# Patient Record
Sex: Female | Born: 1935
Health system: Southern US, Community
[De-identification: ages and names within clinical notes are randomized; demographics above are authoritative.]

## PROBLEM LIST (undated history)

## (undated) DIAGNOSIS — I10 Essential (primary) hypertension: Secondary | ICD-10-CM

## (undated) DIAGNOSIS — K219 Gastro-esophageal reflux disease without esophagitis: Secondary | ICD-10-CM

## (undated) DIAGNOSIS — E039 Hypothyroidism, unspecified: Secondary | ICD-10-CM

## (undated) DIAGNOSIS — F0390 Unspecified dementia without behavioral disturbance: Secondary | ICD-10-CM

## (undated) DIAGNOSIS — E119 Type 2 diabetes mellitus without complications: Secondary | ICD-10-CM

## (undated) DIAGNOSIS — J45909 Unspecified asthma, uncomplicated: Secondary | ICD-10-CM

## (undated) DIAGNOSIS — G20A1 Parkinson's disease without dyskinesia, without mention of fluctuations: Secondary | ICD-10-CM

## (undated) DIAGNOSIS — J9819 Other pulmonary collapse: Secondary | ICD-10-CM

## (undated) DIAGNOSIS — E785 Hyperlipidemia, unspecified: Secondary | ICD-10-CM

## (undated) DIAGNOSIS — G20 Parkinson's disease: Secondary | ICD-10-CM

## (undated) HISTORY — PX: ABDOMINAL HYSTERECTOMY: SHX81

## (undated) HISTORY — PX: CHOLECYSTECTOMY: SHX55

## (undated) HISTORY — PX: OTHER SURGICAL HISTORY: SHX169

---

## 2004-04-15 ENCOUNTER — Ambulatory Visit: Payer: Self-pay | Admitting: Gastroenterology

## 2004-09-05 ENCOUNTER — Ambulatory Visit: Payer: Self-pay | Admitting: Internal Medicine

## 2004-09-08 ENCOUNTER — Ambulatory Visit: Payer: Self-pay | Admitting: Internal Medicine

## 2004-10-16 ENCOUNTER — Ambulatory Visit: Payer: Self-pay | Admitting: Internal Medicine

## 2005-10-27 ENCOUNTER — Ambulatory Visit: Payer: Self-pay | Admitting: Internal Medicine

## 2006-11-02 ENCOUNTER — Ambulatory Visit: Payer: Self-pay | Admitting: Internal Medicine

## 2007-02-02 ENCOUNTER — Ambulatory Visit: Payer: Self-pay | Admitting: Family Medicine

## 2007-10-18 ENCOUNTER — Ambulatory Visit: Payer: Self-pay | Admitting: Internal Medicine

## 2007-10-20 ENCOUNTER — Ambulatory Visit: Payer: Self-pay | Admitting: Internal Medicine

## 2007-12-15 ENCOUNTER — Ambulatory Visit: Payer: Self-pay | Admitting: Internal Medicine

## 2008-12-18 ENCOUNTER — Ambulatory Visit: Payer: Self-pay | Admitting: Internal Medicine

## 2009-07-01 ENCOUNTER — Ambulatory Visit: Payer: Self-pay | Admitting: Gastroenterology

## 2010-01-03 ENCOUNTER — Ambulatory Visit: Payer: Self-pay | Admitting: Internal Medicine

## 2011-02-05 ENCOUNTER — Ambulatory Visit: Payer: Self-pay | Admitting: Internal Medicine

## 2011-02-23 ENCOUNTER — Ambulatory Visit: Payer: Self-pay | Admitting: Internal Medicine

## 2011-04-21 ENCOUNTER — Ambulatory Visit: Payer: Self-pay | Admitting: Internal Medicine

## 2011-05-13 ENCOUNTER — Ambulatory Visit: Payer: Self-pay | Admitting: Internal Medicine

## 2011-09-24 ENCOUNTER — Ambulatory Visit: Payer: Self-pay | Admitting: Internal Medicine

## 2011-12-14 ENCOUNTER — Ambulatory Visit: Payer: Self-pay | Admitting: Internal Medicine

## 2012-01-22 ENCOUNTER — Emergency Department: Payer: Self-pay | Admitting: Emergency Medicine

## 2012-05-03 ENCOUNTER — Ambulatory Visit: Payer: Self-pay | Admitting: Physician Assistant

## 2012-06-13 ENCOUNTER — Ambulatory Visit: Payer: Self-pay | Admitting: Physician Assistant

## 2012-06-16 ENCOUNTER — Ambulatory Visit: Payer: Self-pay | Admitting: Internal Medicine

## 2012-06-16 ENCOUNTER — Ambulatory Visit: Payer: Self-pay | Admitting: Physician Assistant

## 2012-07-03 ENCOUNTER — Inpatient Hospital Stay: Payer: Self-pay | Admitting: Surgery

## 2012-07-03 LAB — COMPREHENSIVE METABOLIC PANEL
Albumin: 3.3 g/dL — ABNORMAL LOW (ref 3.4–5.0)
Alkaline Phosphatase: 109 U/L (ref 50–136)
Anion Gap: 10 (ref 7–16)
BUN: 30 mg/dL — ABNORMAL HIGH (ref 7–18)
Bilirubin,Total: 0.4 mg/dL (ref 0.2–1.0)
Calcium, Total: 8.7 mg/dL (ref 8.5–10.1)
Chloride: 104 mmol/L (ref 98–107)
Co2: 23 mmol/L (ref 21–32)
Creatinine: 0.95 mg/dL (ref 0.60–1.30)
EGFR (African American): >60
EGFR (Non-African Amer.): 59 — ABNORMAL LOW
Glucose: 167 mg/dL — ABNORMAL HIGH (ref 65–99)
Osmolality: 284 (ref 275–301)
Potassium: 4.1 mmol/L (ref 3.5–5.1)
SGOT(AST): 20 U/L (ref 15–37)
SGPT (ALT): 18 U/L (ref 12–78)
Sodium: 137 mmol/L (ref 136–145)
Total Protein: 6.4 g/dL (ref 6.4–8.2)

## 2012-07-03 LAB — URINALYSIS, COMPLETE
Bacteria: NONE SEEN
Bilirubin,UR: NEGATIVE
Glucose,UR: NEGATIVE mg/dL (ref 0–75)
Hyaline Cast: 1
Nitrite: NEGATIVE
Ph: 5 (ref 4.5–8.0)
Protein: NEGATIVE
RBC,UR: 10 /HPF (ref 0–5)
Specific Gravity: 1.021 (ref 1.003–1.030)
Squamous Epithelial: 1
WBC UR: 7 /HPF (ref 0–5)

## 2012-07-03 LAB — CBC
HCT: 37.1 % (ref 35.0–47.0)
HGB: 12.7 g/dL (ref 12.0–16.0)
MCH: 30.4 pg (ref 26.0–34.0)
MCHC: 34.3 g/dL (ref 32.0–36.0)
MCV: 89 fL (ref 80–100)
Platelet: 179 10*3/uL (ref 150–440)
RBC: 4.18 10*6/uL (ref 3.80–5.20)
RDW: 13.3 % (ref 11.5–14.5)
WBC: 8.2 10*3/uL (ref 3.6–11.0)

## 2012-07-08 ENCOUNTER — Encounter: Payer: Self-pay | Admitting: Internal Medicine

## 2012-07-09 ENCOUNTER — Encounter: Payer: Self-pay | Admitting: Internal Medicine

## 2012-08-09 ENCOUNTER — Ambulatory Visit: Payer: Self-pay | Admitting: Physician Assistant

## 2012-08-09 LAB — URINALYSIS, COMPLETE
Bilirubin,UR: NEGATIVE
Glucose,UR: NEGATIVE mg/dL (ref 0–75)
Hyaline Cast: 23
Nitrite: NEGATIVE
Ph: 5 (ref 4.5–8.0)
Protein: NEGATIVE
RBC,UR: 11 /HPF (ref 0–5)
Specific Gravity: 1.024 (ref 1.003–1.030)
Squamous Epithelial: 2
WBC UR: 30 /HPF (ref 0–5)

## 2012-08-09 LAB — BASIC METABOLIC PANEL
Anion Gap: 7 (ref 7–16)
BUN: 29 mg/dL — ABNORMAL HIGH (ref 7–18)
Calcium, Total: 9.3 mg/dL (ref 8.5–10.1)
Chloride: 101 mmol/L (ref 98–107)
Co2: 26 mmol/L (ref 21–32)
Creatinine: 1.2 mg/dL (ref 0.60–1.30)
EGFR (African American): 52 — ABNORMAL LOW
EGFR (Non-African Amer.): 44 — ABNORMAL LOW
Glucose: 258 mg/dL — ABNORMAL HIGH (ref 65–99)
Osmolality: 283 (ref 275–301)
Potassium: 4 mmol/L (ref 3.5–5.1)
Sodium: 134 mmol/L — ABNORMAL LOW (ref 136–145)

## 2012-08-09 LAB — CBC WITH DIFFERENTIAL/PLATELET
Basophil #: 0 10*3/uL (ref 0.0–0.1)
Basophil %: 0.5 %
Eosinophil #: 0.1 10*3/uL (ref 0.0–0.7)
Eosinophil %: 0.9 %
HCT: 38.1 % (ref 35.0–47.0)
HGB: 12.6 g/dL (ref 12.0–16.0)
Lymphocyte %: 10.1 %
Lymphs Abs: 0.8 10*3/uL — ABNORMAL LOW (ref 1.0–3.6)
MCH: 29.6 pg (ref 26.0–34.0)
MCHC: 33 g/dL (ref 32.0–36.0)
MCV: 90 fL (ref 80–100)
Monocyte #: 0.8 x10 3/mm (ref 0.2–0.9)
Monocyte %: 9.4 %
Neutrophil #: 6.3 10*3/uL (ref 1.4–6.5)
Neutrophil %: 79.1 %
Platelet: 237 10*3/uL (ref 150–440)
RBC: 4.25 10*6/uL (ref 3.80–5.20)
RDW: 13.5 % (ref 11.5–14.5)
WBC: 8 10*3/uL (ref 3.6–11.0)

## 2012-08-11 ENCOUNTER — Inpatient Hospital Stay: Payer: Self-pay

## 2012-08-11 LAB — CBC
HCT: 35.8 % (ref 35.0–47.0)
HGB: 11.9 g/dL — ABNORMAL LOW (ref 12.0–16.0)
MCH: 29.3 pg (ref 26.0–34.0)
MCHC: 33.2 g/dL (ref 32.0–36.0)
MCV: 88 fL (ref 80–100)
Platelet: 264 10*3/uL (ref 150–440)
RBC: 4.05 10*6/uL (ref 3.80–5.20)
RDW: 13.5 % (ref 11.5–14.5)
WBC: 6.5 10*3/uL (ref 3.6–11.0)

## 2012-08-11 LAB — BASIC METABOLIC PANEL
Anion Gap: 7 (ref 7–16)
BUN: 30 mg/dL — ABNORMAL HIGH (ref 7–18)
Calcium, Total: 9.2 mg/dL (ref 8.5–10.1)
Chloride: 103 mmol/L (ref 98–107)
Co2: 26 mmol/L (ref 21–32)
Creatinine: 1.13 mg/dL (ref 0.60–1.30)
EGFR (African American): 55 — ABNORMAL LOW
EGFR (Non-African Amer.): 48 — ABNORMAL LOW
Glucose: 226 mg/dL — ABNORMAL HIGH (ref 65–99)
Osmolality: 285 (ref 275–301)
Potassium: 3.8 mmol/L (ref 3.5–5.1)
Sodium: 136 mmol/L (ref 136–145)

## 2012-08-11 LAB — URINALYSIS, COMPLETE
Bilirubin,UR: NEGATIVE
Glucose,UR: 50 mg/dL (ref 0–75)
Leukocyte Esterase: NEGATIVE
Nitrite: NEGATIVE
Ph: 5 (ref 4.5–8.0)
Protein: NEGATIVE
RBC,UR: 30 /[HPF] (ref 0–5)
Specific Gravity: 1.031 (ref 1.003–1.030)
Squamous Epithelial: <1
WBC UR: 1 /[HPF] (ref 0–5)

## 2012-08-11 LAB — URINE CULTURE

## 2012-08-11 LAB — PROTIME-INR
INR: 1.1
Prothrombin Time: 14.5 secs (ref 11.5–14.7)

## 2012-08-11 LAB — TROPONIN I: Troponin-I: <0.02 ng/mL

## 2012-08-12 LAB — COMPREHENSIVE METABOLIC PANEL
Albumin: 2.2 g/dL — ABNORMAL LOW (ref 3.4–5.0)
Alkaline Phosphatase: 148 U/L — ABNORMAL HIGH (ref 50–136)
Anion Gap: 9 (ref 7–16)
BUN: 20 mg/dL — ABNORMAL HIGH (ref 7–18)
Bilirubin,Total: 0.4 mg/dL (ref 0.2–1.0)
Calcium, Total: 8.6 mg/dL (ref 8.5–10.1)
Chloride: 101 mmol/L (ref 98–107)
Co2: 24 mmol/L (ref 21–32)
Creatinine: 0.78 mg/dL (ref 0.60–1.30)
Glucose: 274 mg/dL — ABNORMAL HIGH (ref 65–99)
Osmolality: 281 (ref 275–301)
Potassium: 4.7 mmol/L (ref 3.5–5.1)
SGOT(AST): 13 U/L — ABNORMAL LOW (ref 15–37)
SGPT (ALT): 15 U/L (ref 12–78)
Sodium: 134 mmol/L — ABNORMAL LOW (ref 136–145)
Total Protein: 5.8 g/dL — ABNORMAL LOW (ref 6.4–8.2)

## 2012-08-12 LAB — CBC WITH DIFFERENTIAL/PLATELET
Basophil #: 0 10*3/uL (ref 0.0–0.1)
Basophil %: 0.2 %
Eosinophil #: 0 10*3/uL (ref 0.0–0.7)
Eosinophil %: 0 %
HCT: 35.4 % (ref 35.0–47.0)
HGB: 11.7 g/dL — ABNORMAL LOW (ref 12.0–16.0)
Lymphocyte %: 6.7 %
Lymphs Abs: 0.5 10*3/uL — ABNORMAL LOW (ref 1.0–3.6)
MCH: 29.7 pg (ref 26.0–34.0)
MCHC: 33.1 g/dL (ref 32.0–36.0)
MCV: 90 fL (ref 80–100)
Monocyte #: 0.2 x10 3/mm (ref 0.2–0.9)
Monocyte %: 3.6 %
Neutrophil #: 6 10*3/uL (ref 1.4–6.5)
Neutrophil %: 89.5 %
Platelet: 254 10*3/uL (ref 150–440)
RBC: 3.95 10*6/uL (ref 3.80–5.20)
RDW: 13.3 % (ref 11.5–14.5)
WBC: 6.7 10*3/uL (ref 3.6–11.0)

## 2012-08-16 LAB — CREATININE, SERUM
Creatinine: 0.86 mg/dL (ref 0.60–1.30)
EGFR (African American): >60
EGFR (Non-African Amer.): >60

## 2012-08-17 LAB — CULTURE, BLOOD (SINGLE)

## 2012-08-18 ENCOUNTER — Encounter: Payer: Self-pay | Admitting: Internal Medicine

## 2012-08-22 ENCOUNTER — Ambulatory Visit: Payer: Self-pay | Admitting: Cardiothoracic Surgery

## 2012-09-06 ENCOUNTER — Ambulatory Visit: Payer: Self-pay | Admitting: Cardiothoracic Surgery

## 2012-09-06 ENCOUNTER — Encounter: Payer: Self-pay | Admitting: Internal Medicine

## 2012-10-06 ENCOUNTER — Ambulatory Visit: Payer: Self-pay | Admitting: Cardiothoracic Surgery

## 2013-04-04 ENCOUNTER — Ambulatory Visit: Payer: Self-pay | Admitting: Internal Medicine

## 2013-04-05 ENCOUNTER — Encounter: Payer: Self-pay | Admitting: Neurology

## 2013-04-08 ENCOUNTER — Encounter: Payer: Self-pay | Admitting: Neurology

## 2013-05-08 ENCOUNTER — Encounter: Payer: Self-pay | Admitting: Neurology

## 2013-06-08 ENCOUNTER — Encounter: Payer: Self-pay | Admitting: Neurology

## 2013-09-15 DIAGNOSIS — R42 Dizziness and giddiness: Secondary | ICD-10-CM | POA: Insufficient documentation

## 2013-09-15 DIAGNOSIS — R262 Difficulty in walking, not elsewhere classified: Secondary | ICD-10-CM | POA: Insufficient documentation

## 2013-09-15 DIAGNOSIS — E119 Type 2 diabetes mellitus without complications: Secondary | ICD-10-CM | POA: Insufficient documentation

## 2013-10-02 ENCOUNTER — Ambulatory Visit: Payer: Self-pay | Admitting: Internal Medicine

## 2013-11-09 DIAGNOSIS — R5383 Other fatigue: Secondary | ICD-10-CM | POA: Insufficient documentation

## 2014-01-16 DIAGNOSIS — G20 Parkinson's disease: Secondary | ICD-10-CM | POA: Insufficient documentation

## 2014-01-16 DIAGNOSIS — G20A1 Parkinson's disease without dyskinesia, without mention of fluctuations: Secondary | ICD-10-CM | POA: Insufficient documentation

## 2014-02-14 ENCOUNTER — Emergency Department: Payer: Self-pay | Admitting: Emergency Medicine

## 2014-03-08 DIAGNOSIS — I10 Essential (primary) hypertension: Secondary | ICD-10-CM | POA: Insufficient documentation

## 2014-06-19 DIAGNOSIS — E119 Type 2 diabetes mellitus without complications: Secondary | ICD-10-CM | POA: Insufficient documentation

## 2014-06-19 DIAGNOSIS — N183 Chronic kidney disease, stage 3 unspecified: Secondary | ICD-10-CM | POA: Insufficient documentation

## 2014-06-19 DIAGNOSIS — E1122 Type 2 diabetes mellitus with diabetic chronic kidney disease: Secondary | ICD-10-CM | POA: Insufficient documentation

## 2014-09-28 NOTE — H&P (Signed)
PATIENT NAME:  Jacqueline [name], Jacqueline [name] MR#:  [id number] DATE OF BIRTH:  1935/12/18  DATE OF ADMISSION:  08/11/2012  REFERRING PHYSICIAN:  CONSULTING PHYSICIAN: Harrell Gave A. Lundquist, M.D.   REASON FOR CONSULTATION: Large right-sided effusion/hemothorax after previous fall with rib fractures.   HISTORY OF PRESENT ILLNESS: The patient is a pleasant 79 year old female, who I have treated personally for a trauma, which occurred in January of this year. She was admitted following a fall where she fell backwards after she slipped on steps; approximately 36 inches. She hit her right chest at that time and had rib fractures. She was admitted for pain control at that time and she now presents with increasing shortness of breath over the last few days to the point of lightheadedness. On CT scan, she was found to have a large effusion with some blood and a significantly collapsed right lung. She otherwise is not having any fevers, subjective chills, night sweats or cough. She is having some chest pain with deep inhalation. No abdominal pain, nausea, vomiting, diarrhea, constipation, dysuria or hematuria. She appears to have a UTI on her urinalysis.   PAST MEDICAL HISTORY:  1.  Rib fractures, status post fall. 2.  Diabetes.  3.  Hypertension.  4.  Hypothyroidism.  5.  Depression.   PAST SURGICAL HISTORY: History of cholecystectomy and hysterectomy.   ALLERGIES: None.   MEDICATIONS: As follows:  1.  Vitamin D daily.  2.  Symbicort  2 puffs b.i.d.  3.  Ramipril 2.5 daily.  4.  Omeprazole 40 mg daily.  5.  Novolin sliding scale.  6.  L-thyroxine 50 mcg p.o. daily. 7.  Glimepiride 4 mg p.o. daily. 8.  Celexa 40 mg p.o. daily.  9.  Aspirin 81 mg p.o. daily. 10. Percocet p.r.n.   ALLERGIES: No known drug allergies.   FAMILY HISTORY: Noncontributory.    SOCIAL HISTORY: She has been staying with her daughter. She was in rehab short-term. She does not smoke or drink.   REVIEW OF SYSTEMS: A 12-point  review of systems obtained. Pertinent positives and negatives as noted.   PHYSICAL EXAM:  VITAL SIGNS: Temperature 97.8, pulse 91, blood pressure 120/65, respirations 18, pulse oximetry 94% on facemask.  GENERAL: No acute distress. Alert and oriented x 3.  HEAD: Normocephalic, atraumatic.  EYES: No scleral icterus. No conjunctivitis. FACE: No obvious facial trauma. Normal external nose. Normal external ears.  NECK: No obvious neck masses or swelling.  CHEST: Decreased breath sounds on the right, particularly at the bases. No retractions, but appears uncomfortable with normal breathing.  HEART: Regular rate and rhythm. No murmurs, rubs, or gallops.  ABDOMEN: Soft, nontender and nondistended.  EXTREMITIES: Moves all extremities well. Strength 5 out of 5. Sensation is intact in all 4 extremities. Cranial nerves II through XII grossly intact.   LABS: Significant for white cell count 6.5, hemoglobin 11.9, hematocrit 35.8, platelets 264. Troponin is less than 0.02. Blood sugar 226. She does have a UA, which shows 3+ leukocyte esterase and 30 white blood cell count. CT scan shows a large right effusion with significant collapse of right lung as well as previously identified fractures.   ASSESSMENT AND PLAN: The patient is a 79 year old female with history of rib fractures with a large effusion, which is significantly compressing her right lung. Dr. Genevive Bi evaluated the patient with myself and would be for either tube thoracostomy or video-assisted thoracotomy and possible decortication. As the patient has had rib fractures 2 months ago, there is likely a good  chance that this could be a loculated effusion, which would not expand with a chest tube and we would thus be delaying the inevitable of placing a chest tube. We have thus elected for video-assisted thoracoscopy and as well as decortication and direct placement of tube. The patient is in agreement with this.   ____________________________ Glena Norfolk. Lundquist, MD cal:aw D: 08/11/2012 21:82:88 ET T: 08/11/2012 08:27:19 ET JOB#: [id number]  cc: Harrell Gave A. Lundquist, MD, <Dictator> Floyde Parkins MD ELECTRONICALLY SIGNED 08/13/2012 11:15

## 2014-09-28 NOTE — H&P (Signed)
PATIENT NAME:  Jacqueline [name], Jacqueline [name] MR#:  [id number] DATE OF BIRTH:  07/13/1935  DATE OF ADMISSION:  07/03/2012  CHIEF COMPLAINT: Right chest pain.   HISTORY OF PRESENT ILLNESS: This is a patient who experienced a backwards fall from the third step inside her house where she slipped on the steps and fell backward, a height of approximately 36 inches or less. She did not lose consciousness. Her chief complaint is that of right chest and back pain and right shoulder pain. She is not short of breath. I was asked to see the patient for evaluation and admission for pain control issues concerning rib fractures posteriorly.   PAST MEDICAL HISTORY: Diabetes, hypertension, hypothyroidism, and depression.   PAST SURGICAL HISTORY: Cholecystectomy and hysterectomy.   ALLERGIES: None.   MEDICATIONS: Multiple, see chart.   FAMILY HISTORY: Noncontributory.   SOCIAL HISTORY: The patient does not smoke or drink. She lives at home.   REVIEW OF SYSTEMS: A 10-system review is performed and negative with the exception of that mentioned in the HPI.   PHYSICAL EXAMINATION: GENERAL: Healthy, comfortable-appearing Caucasian female patient. She is accompanied by her family.  VITAL SIGNS: Temperature 98.1, pulse 72, respirations 16, blood pressure 127/65. Pain scale of 9.  HEENT: No scleral icterus.  NECK: No palpable neck nodes.  CHEST: Clear to auscultation. She has ecchymosis starting on the back of the chest on the right side and on the right posterior hip, tender posteriorly as well.  CARDIAC: Regular rate and rhythm.  ABDOMEN: Soft, nontender.  EXTREMITIES: Without edema.  NEUROLOGICAL: Grossly intact.  INTEGUMENT: No jaundice. The ecchymosis as noted above, as mentioned.   LABORATORY AND RADIOLOGICAL DATA:  CT head and neck are within normal limits. No bony abnormalities. There are multiple posterior rib fractures on her chest CT.  Right shoulder films are within normal limits with the exception of the rib  fractures.   Urinalysis shows 2+ leukocyte esterase. Electrolytes are within normal limits. H and H is 12.7 and 37.   ASSESSMENT AND PLAN: This is a patient with a fall backwards off of her steps at her house, approximately 3 feet or less, with no loss of consciousness or chief complaint as related to rib fractures on the right side.  Pain control will be an issue with this many rib fractures. I have talked to Dr. Jasmine December, the Emergency Room physician, and the plan is for admission and pain control. The rationale for this has been discussed with the patient and her family. They understood and agreed to proceed.   ____________________________ Jerrol Banana Burt Knack, MD rec:cb D: 07/03/2012 15:34:08 ET T: 07/03/2012 16:05:04 ET JOB#: [id number]  cc: Jerrol Banana. Burt Knack, MD, <Dictator> Florene Glen MD ELECTRONICALLY SIGNED 07/03/2012 18:34

## 2014-09-28 NOTE — Discharge Summary (Signed)
PATIENT NAME:  Jacqueline [name], Jacqueline [name] MR#:  [id number] DATE OF BIRTH:  08-13-1935  DATE OF ADMISSION:  08/11/2012 DATE OF DISCHARGE:  08/17/2012   ADMITTING DIAGNOSIS: Right hemothorax.   POSTOPERATIVE DIAGNOSIS: Right hemothorax.  OPERATION PERFORMED: Right thoracoscopy with evacuation of hemothorax, 08/11/2012.   HOSPITAL COURSE: The patient is a 79 year old woman who fell and sustained multiple rib fractures on the right several weeks ago. She was initially managed in the hospital for pain control, but presented to our Emergency Department with increasing shortness of breath and cough and a chest CT revealing a large hemothorax on the right. There were multiple rib fractures that were in various stages of healing and she was taken to the Operating Room on 08/11/2012, at which time she underwent a right thoracoscopy with evacuation of her hemothorax. She did well in the postoperative period and had her chest tubes removed on March 12. At the time her chest tube was removed, she had a small apical pneumothorax which had been sterile. The post-removal chest x-ray showed that the small pneumothorax was unchanged after removal of her tubes. Her wounds were sterilely dressed and she was allowed to be transferred to a skilled nursing facility/rehab facility.   DISCHARGE MEDICATIONS: At the time of discharge, her medications included glimepiride 4 mg once a day, levothyroxine 50 mcg once a day, Celexa 40 mg once a day, omeprazole 40 mg once a day, Symbicort 2 puffs twice a day, ramipril 2.5 mg once a day, Norco 5/325 with 1 tablet or 2 tablets every 4 hours as needed and levofloxacin 250 mg once a day.   DISCHARGE INSTRUCTIONS: She should follow up with Dr. Nestor Lewandowsky at  [facility] next Monday, March 17. In addition, she should have an x-ray made prior to her visit. She should also have her dressings remain intact until Saturday, March 15, at which time the dressing should be completely removed. The wound  should be cleansed with Hibiclens and covered with a sterile dry gauze pad.   Should the patient experience any fevers, chills or significant drainage from her wound, she should contact [facility].     ____________________________ Lew Dawes. Genevive Bi, MD teo:jm D: 08/17/2012 15:30:09 ET T: 08/17/2012 16:12:33 ET JOB#: [id number]  cc: Christia Reading E. Genevive Bi, MD, <Dictator> Louis Matte MD ELECTRONICALLY SIGNED 08/22/2012 7:52

## 2014-09-28 NOTE — H&P (Signed)
Subjective/Chief Complaint rt chest pain    History of Present Illness fall from third step backwards, no LOC rt chest and back pain, rt shoulder paIN    Past History PMH: DM, HTN, hypothyroid, depression PSH: GB, hysterectomy    Past Medical Health Hypertension, Diabetes Mellitus   Past Med/Surgical Hx:  HTN:   Diabetes:   ALLERGIES:  No Known Allergies:   HOME MEDICATIONS: Medication Instructions Status  Janumet 50 mg-500 mg oral tablet 1 tab(s) orally once a day Active  ramipril 5 mg oral capsule 1 cap(s) orally once a day Active  glimepiride 4 mg oral tablet 1 tab(s) orally once a day Active  levothyroxine 50 mcg (0.05 mg) oral capsule 1 cap(s) orally once a day Active  aspirin 81 mg oral tablet 1 tab(s) orally once a day Active  Celexa 40 mg oral tablet 1 tab(s) orally once a day Active  Flonase 50 mcg/inh nasal spray 1 spray(s) nasal once a day, As Needed Active  omeprazole 40 mg oral delayed release capsule 1 cap(s) orally once a day Active  Symbicort 80 mcg-4.5 mcg/inh inhalation aerosol 2 puff(s) inhaled 2 times a day Active   Family and Social History:   Family History Non-Contributory    Social History negative tobacco, negative ETOH    Place of Living Home   Review of Systems:   Fever/Chills No    Cough No    Abdominal Pain No    Diarrhea No    Constipation No    Nausea/Vomiting No    SOB/DOE No    Chest Pain Yes  rt posterior    Dysuria No    Tolerating Diet Yes    Medications/Allergies Reviewed Medications/Allergies reviewed   Physical Exam:   GEN no acute distress    HEENT pink conjunctivae    NECK supple    RESP normal resp effort  clear BS  no use of accessory muscles  bruising rt post chest and hip    CARD regular rate    ABD denies tenderness  soft    LYMPH negative neck    PSYCH alert, A+O to time, place, person, good insight   Lab Results: Hepatic:  26-Jan-14 08:10    Bilirubin, Total 0.4   Alkaline Phosphatase  109   SGPT (ALT) 18   SGOT (AST) 20   Total Protein, Serum 6.4   Albumin, Serum  3.3  Routine Chem:  26-Jan-14 08:10    Glucose, Serum  167   BUN  30   Creatinine (comp) 0.95   Sodium, Serum 137   Potassium, Serum 4.1   Chloride, Serum 104   CO2, Serum 23   Calcium (Total), Serum 8.7   Osmolality (calc) 284   eGFR (African American) >60   eGFR (Non-African American)  59 (eGFR values <57mL/min/1.73 m2 may be an indication of chronic kidney disease (CKD). Calculated eGFR is useful in patients with stable renal function. The eGFR calculation will not be reliable in acutely ill patients when serum creatinine is changing rapidly. It is not useful in  patients on dialysis. The eGFR calculation may not be applicable to patients at the low and high extremes of body sizes, pregnant women, and vegetarians.)   Anion Gap 10  Routine UA:  26-Jan-14 08:47    Color (UA) Yellow   Clarity (UA) Hazy   Glucose (UA) Negative   Bilirubin (UA) Negative   Ketones (UA) Trace   Specific Gravity (UA) 1.021   Blood (UA) 2+  pH (UA) 5.0   Protein (UA) Negative   Nitrite (UA) Negative   Leukocyte Esterase (UA) 2+ (Result(s) reported on 03 Jul 2012 at 09:11AM.)   RBC (UA) 10 /HPF   WBC (UA) 7 /HPF   Bacteria (UA) NONE SEEN   Epithelial Cells (UA) 1 /HPF   Mucous (UA) PRESENT   Hyaline Cast (UA) 1 /LPF (Result(s) reported on 03 Jul 2012 at 09:11AM.)  Routine Hem:  26-Jan-14 08:10    WBC (CBC) 8.2   RBC (CBC) 4.18   Hemoglobin (CBC) 12.7   Hematocrit (CBC) 37.1   Platelet Count (CBC) 179 (Result(s) reported on 03 Jul 2012 at 08:24AM.)   MCV 89   MCH 30.4   MCHC 34.3   RDW 13.3   Radiology Results: XRay:    18-Apr-13 09:35, Upper GI   Upper GI   REASON FOR EXAM:    Dysphagia  COMMENTS:       PROCEDURE: FL  - FL UPPER GI  - Sep 24 2011  9:35AM     RESULT:     HISTORY: Dysphasia.    FINDINGS: Standard Upper GI obtained. The esophagus and stomach are   normal. The duodenal bulb is  normal. C-loop is normal.    IMPRESSION:  Benign exam.      Thank you for this opportunity to contribute to the care of your patient.           Verified By: Osa Craver, M.D., MD    16-Aug-13 23:25, Facial Bones Limited   Facial Bones Limited   REASON FOR EXAM:    fall, swelling, right zygomatic arch  COMMENTS:   LMP: Post-Menopausal    PROCEDURE: DXR - DXR FACIAL BONES LIMITED  - Jan 22 2012 11:25PM     RESULT: Five views of the facial bones are submitted. The maxillary   sinuses exhibit no air-fluid levels. The nasal bone appears intact. The   orbital rims also appear intact. The zygomatic arches are intact where   visualized.    IMPRESSION:  There is no evidence of acute facial bone fracture on this   plain film series. If there are strong clinical concerns of an occult   fracture, further evaluation with CT scanning would be useful.        Verified By: DAVID A. Martinique, M.D., MD    16-Aug-13 23:25, Wrist Right Complete   Wrist Right Complete   REASON FOR EXAM:    fall  COMMENTS:   LMP: Post-Menopausal    PROCEDURE: DXR - DXR WRIST RT COMP WITH OBLIQUES  - Jan 22 2012 11:25PM     RESULT: Four views of the right wrist reveal the bones to be mildly   osteopenic. There is no evidence of an acute fracture. There are mild   degenerative changes of the first carpometacarpal joint. The overlying   soft tissues are normal in appearance.    IMPRESSION:  There is no evidence of an acute fracture of the right wrist.          Verified By: DAVID A. Martinique, M.D., MD    06-Jan-14 16:13, Hand Left Complete   Hand Left Complete   REASON FOR EXAM:    lt hand pain in snuffbox area  COMMENTS:       PROCEDURE: DXR - DXR HAND LT COMPLETE  W/OBLIQUES  - Jun 13 2012  4:13PM     RESULT: Three views of the left hand reveal the bones to be osteopenic.  There is mild degenerative interphalangeal joint change. This is most   pronounced at the level of the DIP joint of the index  finger with is mild   lateral subluxation of the distal phalanx with respect to the middle   phalanx. I do not see evidence of an acute fracture. The metacarpals   appear intact.    IMPRESSION:  There are mild degenerative interphalangeal joint changes.   Slight subluxation laterally of the distal phalanx with respect to the   middle phalanx is noted of the index finger. This could be acute or old     butno fracture is demonstrated. Incidental note is made of mild   degenerative change of the first carpometacarpal joint.     Dictation Site: 2        Verified By: DAVID A. Martinique, M.D., MD    26-Jan-14 01:41, Chest PA and Lateral   Chest PA and Lateral   REASON FOR EXAM:    fall-rib pain  COMMENTS:   LMP: Post Hysterectomy    PROCEDURE: DXR - DXR CHEST PA (OR AP) AND LATERAL  - Jul 03 2012  1:41AM     RESULT: There appear to be several right rib fractures including the a   and 9 and possibly seventh ribs. There is a small amount of pleural   fluid, likely minimal hemorrhage. No pneumothorax is evident. The cardiac   silhouette is normal. There is no mediastinal widening. The left   hemithorax is clear.    IMPRESSION:  Right rib fractures which are slightly displaced.    Dictation Site: 6    Verified By: Sundra Aland, M.D., MD    26-Jan-14 01:41, Shoulder Right Complete   Shoulder Right Complete   REASON FOR EXAM:    fall-shoulder pain  COMMENTS:       PROCEDURE: DXR - DXR SHOULDER RIGHT COMPLETE  - Jul 03 2012  1:41AM     RESULT: Right shoulder image shows the humeral head located in the   glenoid without a proximal humeral fracture or definite scapular   fracture. The clavicle appears intact. There are changes of right rib   fracture involving the eighth rib with slight displacement. Additional   rib fractures may be present.    IMPRESSION:  Right rib fracture. No acute bony abnormality of the right   shoulder evident.    Dictation Site: 6    Verified By:  Sundra Aland, M.D., MD  LabUnknown:    26-Nov-13 16:09, Screening Digital Mammogram   [id number]   REASON FOR EXAM:    scr mammo no order  COMMENTS:       PROCEDURE: MAM - MAM DGTL SCRN MAM NO ORDER W/CAD  - May 03 2012  4:09PM     RESULT:     FINDINGS: There is no personal or family history of breast cancer. The   patient denies previous breast surgery.     Comparison is made to digital images including exams dated 05 February 2011, 03 January 2010 and 18 December 2008 and to previous images dating back to   16 Oct 2004.     There is a mildly dense parenchymal pattern without a developing     parenchymal density, dominant mass or malignant calcifications. Skin nevi   are marked bilaterally.    IMPRESSION:  Stable, benign appearing bilateral mammogram.     BI-RADS: Category 2 - Benign Findings     RECOMMENDATION:  Please continue to encourage annual  mammographic   follow-up and monthly breast self exam.    Dictation Site: 2    Thank you for this opportunity to contribute to the care of your patient.    A NEGATIVE MAMMOGRAM REPORT DOES NOT PRECLUDE BIOPSY OR OTHER EVALUATION     OF A CLINICALLY PALPABLE OR OTHERWISE SUSPICIOUS MASS OR LESION. BREAST   CANCER MAY NOT BE DETECTED BY MAMMOGRAPHY IN UP TO 10% OF CASES.         Verified By: Sundra Aland, M.D., MD   PACS Image    06-Jan-14 16:13, Hand Left Complete   PACS Image    09-Jan-14 11:27, KDLWRIST   KDLWRIST   REASON FOR EXAM:    left wrist pain  COMMENTS:       PROCEDURE: KDR - KDXR WRIST LT COMP WITH OBLIQUES  - Jun 16 2012 11:27AM     RESULT: Four views of the left wrist are submitted.    The bones are osteopenic. The distal radius and ulna appear intact. No   carpal bone fracture is demonstrated. There are degenerative changes of   the intercarpal joints and of the first carpometacarpal joint. The   metacarpals appear intact.    IMPRESSION:  There are degenerative changes of the wrist. I do  not see  objective evidence of an acute fracture.  If there are strong clinical concerns of an occult fracture or of   significant ligamentous or tendinous injury, followup MRI may be useful.     Dictation Site: 2        Verified By: DAVID A. Martinique, M.D., MD   PACS Image    09-Jan-14 11:52, CT Chest Without Contrast   PACS Image    09-Jan-14 11:52, CT Head Without Contrast   PACS Image    26-Jan-14 01:41, Chest PA and Lateral   PACS Image    26-Jan-14 01:41, Shoulder Right Complete   PACS Image    26-Jan-14 09:48, CT Cervical Spine Without Contrast   PACS Image    26-Jan-14 09:48, CT Head Without Contrast   PACS Image    26-Jan-14 09:55, CT Chest, Abd, and Pelvis With Contrast   PACS Image  CT:    08-Jul-13 12:00, CT Chest With Contrast   CT Chest With Contrast   REASON FOR EXAM:    pulmonary nodule fu  COMMENTS:       PROCEDURE: CT  - CT CHEST WITH CONTRAST  - Dec 14 2011 12:00PM     RESULT: Comparison: 04/21/2011    Technique: Multiple axial images of the chest were obtained with 75 mL   Isovue 370 intravenous contrast.    Findings:  There are small calcifications in the left lobe of the thyroid, which are   nonspecific. No mediastinal, hilar, or axillary lymphadenopathy. Mild   prominence of the central intrahepatic biliary ducts as well as the   extrahepatic bile duct may be related to the prior cholecystectomy.  There is a 5 mm focus of subpleural thickening in the posterior superior   right lower lobe which appears less conspicuous than prior, particularly   in its anterior-posterior dimension. Linear density in the left lower   lobe is likely secondary to atelectasis. Minimal dependent density in the   posterior superior left lower lobe is likely secondary to atelectasis.    No aggressive lytic or sclerotic osseous lesions are identified.    IMPRESSION:   Small, 5 mm subpleural density in the posterior superior right lower lobe  is less conspicuous than  prior and likely represents an area of evolving   atelectasis or scarring. However, a followup noncontrast chest CT is   suggested in 6 months.        Verified By: Gregor Hams, M.D., MD    09-Jan-14 11:52, CT Chest Without Contrast   CT Chest Without Contrast   REASON FOR EXAM:    Lung Nodule Follow Up  COMMENTS:       PROCEDURE: KCT - KCT CHEST WITHOUT CONTRAST  - Jun 16 2012 11:52AM     RESULT:     Comparison is made to a prior study dated 12/14/2011.    Technique: Helical noncontrast 3 mm sections are obtained from the   thoracic inlet through the lung bases.    Findings: The mediastinal and hilar regions and structures demonstrate   small, subcentimeter lymph nodes in the paratracheal region, AP window   and to a lesser extent prevascular space. Thereis no gross evidence of     mediastinal masses. The lung parenchyma demonstrates near complete   resolution of the previously described nodular area of pleural thickening   within the posterior aspect of the superior segment of the right lower   lobe. A stable area of increased density projects within the medial   aspect of the lingula anteriorly. No further focal regions of   consolidation or focal infiltrates are appreciated. The visualized upper   abdominal viscera demonstrate intrahepatic andextrahepatic biliary   ductal dilatation. Small, rounded hyperdense foci are identified within   the left kidney measuring approximately 7 to 8 mm in diameter.     IMPRESSION:     1.  Near complete resolution of the nodular pleural thickening within the   right lower lobe.  2.  Persistent density in the region of the lingula, stable.  3.  Indeterminate findings in the left kidney.  4.  Intrahepatic and extrahepatic biliary ductal dilatation. Clinical   correlation is recommended, if clinically warranted. Findings may be   secondary to the patient's age as well as prior cholecystectomy, if   clinically appropriate. Continued  surveillance evaluation is recommended.       Thank you for this opportunity to contribute to the care of your patient.         Verified By: Mikki Santee, M.D., MD    09-Jan-14 11:52, CT Head Without Contrast   CT Head Without Contrast   REASON FOR EXAM:    muscle weakness gait disturbance eval for parkinsons  COMMENTS:       PROCEDURE: KCT - KCT HEAD WITHOUT CONTRAST  - Jun 16 2012 11:52AM     RESULT: Technique: Helical noncontrasted 5 mm sections were obtained from   the skull basethrough the vertex.    Findings: Diffuse cortical and cerebellar atrophy is identified as well   as diffuse areas of low attenuation within the subcortical, deep and   periventricular white matter regions. There is not evidence of   intra-axial nor extra-axial fluid collections, acute hemorrhage, mass   effect, nor a depressed skull fracture. The visualized paranasal sinuses   and mastoid air cells are patent.  IMPRESSION:  Chronic and involutional changes without evidence of acute   abnormalities. If there is persistent clinical concern further evaluation   with MRI is recommended.           Thank you for the opportunity to contribute to the care of your patient.  Verified By: Mikki Santee, M.D., MD    26-Jan-14 09:48, CT Cervical Spine Without Contrast   CT Cervical Spine Without Contrast   REASON FOR EXAM:    pain following trauma  COMMENTS:       PROCEDURE: CT  - CT CERVICAL SPINE WO  - Jul 03 2012  9:48AM     RESULT: Emergent noncontrast CT of the cervical spine is performed   utilizing a multislice helical acquisition. Images are reconstructed at 2   mm slice thickness at bone window settings in the axial, coronal and   sagittal planes. There is a scoliotic curvature concave toward the left   in the cervical spine which could be secondary to muscle spasm. There is   loss of the normal cervical lordosis. The alignment is otherwise normal.   The prevertebral soft tissues  are normal. No fracture or subluxation is   evident. Facet hypertrophy is present.    IMPRESSION:   1. No acute cervical spine bony abnormality demonstrated. Facet   hypertrophy predominantly on the left in the mid to lower cervical region.    Dictation Site: 6        Verified By: Sundra Aland, M.D., MD    26-Jan-14 09:48, CT Head Without Contrast   CT Head Without Contrast   REASON FOR EXAM:    pain following trauma  COMMENTS:       PROCEDURE: CT  - CT HEAD WITHOUT CONTRAST  - Jul 03 2012  9:48AM     RESULT: Comparison is made to the previous exam dated 16 June 2012.    A there is prominence of the ventricles and sulciconsistent with   atrophy. There is no intracranial hemorrhage, mass or mass effect.   Bilateral basal ganglia lacunar infarcts are present. There is   low-attenuation diffusely in the periventricular and subcortical white   matter. The sinuses and mastoid air cells show normal aeration. The   calvarium is intact.    IMPRESSION:   1. Atrophy with chronic microvascular ischemic disease and bilateral   basal ganglia lacunar infarcts. No acute intracranial abnormality evident.    Dictation Site: 6        Verified By: Sundra Aland, M.D., MD    26-Jan-14 09:55, CT Chest, Abd, and Pelvis With Contrast   CT Chest, Abd, and Pelvis With Contrast   REASON FOR EXAM:    (1) pain following trauma; (2) pain following trauma  COMMENTS:       PROCEDURE: CT  - CT CHEST ABDOMEN AND PELVIS W  - Jul 03 2012  9:55AM     RESULT: CT of the chest, abdomen and pelvis is performed utilizing 80 mL   of Isovue-300 iodinated intravenous contrast without oral contrast.   Images are reconstructed at 3 mm slice thickness in the axial plane.   Comparison is made to images dated 14 December 2011 and 21 April 2011   through the chest.    There is reported history of cholecystectomy, hysterectomy and   appendectomy. There is a small right pleural effusion area trace pleural    thickening or left pleural effusion is present. There is no evidence of   pneumothorax. Bilateral lower lobe atelectasis or infiltrate is present,     slightly more prominent on the right. There is no edema. No discrete   focal pulmonary masses appreciated. The heart is mildly enlarged. There   is no pericardial effusion. The included thyroid lobes appear to be  unremarkable. There is noaxillary mass or adenopathy. The thoracic aorta   is normal in caliber area there are fractures in the right 10, 9 comment   eighth and seventh ribs as well is in the sixth rib. The spine appears to   be intact. The pelvis appears unremarkable. The clavicles appear intact.    There is intrahepatic and extrahepatic biliary ductal dilation. The   common bile duct diameter is 1.8 cm. This is not significantly changed   compared to 21 April 2011 but is slightly larger. Intrahepatic ductal   dilation was present at that time as well. Given the history of   cholecystectomy it is uncertain if this is significant. Correlate with   clinical and laboratory data. The kidneys appear to enhance symmetrically   without obstruction. The adrenal glands are unremarkable. The liver and     spleen appear intact. There is a small hiatal hernia. The abdominal aorta   is normal in caliber. The pancreas shows no mass or ductal dilation. The   stomach is nondistended. The small bowel and colon appear to be   unremarkable. The urinary bladder contains a moderately large amount of   urine. There are cystic changes in the left pelvis suggestive of multiple   left adnexal cysts. Cyst in the posterior right pelvis is likely from the   right ovary. Small amountof free fluid appears present in the right   pelvis. The abdominal wall appears intact.    IMPRESSION:   1. Multiple posterior right rib fractures.  2. Intra and extrahepatic biliary ductal dilation.  3. Ovarian cysts. Given the patient's age this should be in a  weighted   with gynecologic consultation obtained.  4. Not mentioned above or small renal cysts.  Dictation Site: 6        Verified By: Sundra Aland, M.D., MD     Assessment/Admission Diagnosis fall, no LOC fx ribs, no PTX admit for pain control   Electronic Signatures: Florene Glen (MD)  (Signed 26-Jan-14 11:41)  Authored: CHIEF COMPLAINT and HISTORY, PAST MEDICAL/SURGIAL HISTORY, ALLERGIES, HOME MEDICATIONS, FAMILY AND SOCIAL HISTORY, REVIEW OF SYSTEMS, PHYSICAL EXAM, LABS, Radiology, ASSESSMENT AND PLAN   Last Updated: 26-Jan-14 11:41 by Florene Glen (MD)

## 2014-09-28 NOTE — Discharge Summary (Signed)
PATIENT NAME:  Jacqueline [name], Jacqueline [name] MR#:  [id number] DATE OF BIRTH:  [birth year]  DATE OF ADMISSION:  07/03/2012  DATE OF DISCHARGE:  07/07/2012  REASON FOR ADMISSION:  Fall, right rib fractures x 5.   OTHER DIAGNOSES: Diabetes mellitus, hypertension, hypothyroidism, depression, history of gallbladder surgery and hysterectomy.   DISCHARGE MEDICATIONS: Are as follows: Janumet 50/500 p.o. daily, ramipril  5 mg p.o. daily, glimepiride 5 mg p.o. daily, L-thyroxine 50 mcg p.o. daily, aspirin 81 p.o. daily, Celexa 40 mg p.o. daily, Flonase 50 mcg inhaler nasal spray, 1 p.o. daily; omeprazole 40 mg p.o. daily, Symbicort 2 puffs b.i.d. Vicodin 1 tab p.o. q.4 hours p.r.n. pain.   INDICATION FOR ADMISSION:  The patient is a pleasant 79 year old female who had a fall which resulted in 5 rib fractures on the right. She was admitted for pain control, pulmonary toilet and physical therapy.   HOSPITAL COURSE:  The patient was admitted. She was begun on PCA and given oral pain meds. Throughout the hospital course, her pain became better controlled with oral, and she was requiring less IV narcotics. At the time of discharge, she was tolerating her pain and undergoing physical therapy with oral narcotics and no IV narcotics.   DISCHARGE INSTRUCTIONS:  The patient is to go to rehabilitation. She is to continue to take Percocet for pain control. She is to follow up with myself or Dr. Burt Knack in 1 to 2 weeks to ensure she is improving.   ____________________________ Glena Norfolk. Tallyn Holroyd, MD cal:dm D: 07/07/2012 09:52:43 ET T: 07/07/2012 10:28:57 ET JOB#: [id number]  cc: Harrell Gave A. Jeremey Bascom, MD, <Dictator> Floyde Parkins MD ELECTRONICALLY SIGNED 07/09/2012 13:20
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
## 2014-11-06 ENCOUNTER — Other Ambulatory Visit
Admission: RE | Admit: 2014-11-06 | Discharge: 2014-11-06 | Disposition: A | Discharge status: Home / Self Care | Payer: Medicare Other | Source: Ambulatory Visit | Attending: Internal Medicine | Admitting: Internal Medicine

## 2014-11-06 DIAGNOSIS — N390 Urinary tract infection, site not specified: Secondary | ICD-10-CM | POA: Insufficient documentation

## 2014-11-06 DIAGNOSIS — R109 Unspecified abdominal pain: Secondary | ICD-10-CM | POA: Diagnosis not present

## 2014-11-06 DIAGNOSIS — G259 Extrapyramidal and movement disorder, unspecified: Secondary | ICD-10-CM | POA: Insufficient documentation

## 2014-11-06 DIAGNOSIS — E042 Nontoxic multinodular goiter: Secondary | ICD-10-CM | POA: Diagnosis not present

## 2014-11-06 LAB — FOLATE: Folate: 10.4 ng/mL (ref >5.9)

## 2014-11-06 LAB — COMPREHENSIVE METABOLIC PANEL
ALT: 8 U/L — ABNORMAL LOW (ref 14–54)
AST: 16 U/L (ref 15–41)
Albumin: 4.1 g/dL (ref 3.5–5.0)
Alkaline Phosphatase: 134 U/L — ABNORMAL HIGH (ref 38–126)
Anion gap: 10 (ref 5–15)
BUN: 28 mg/dL — ABNORMAL HIGH (ref 6–20)
CO2: 31 mmol/L (ref 22–32)
Calcium: 10.2 mg/dL (ref 8.9–10.3)
Chloride: 101 mmol/L (ref 101–111)
Creatinine, Ser: 1.14 mg/dL — ABNORMAL HIGH (ref 0.44–1.00)
GFR calc Af Amer: 53 mL/min — ABNORMAL LOW (ref >60)
GFR calc non Af Amer: 46 mL/min — ABNORMAL LOW (ref >60)
Glucose, Bld: 211 mg/dL — ABNORMAL HIGH (ref 65–99)
Potassium: 4 mmol/L (ref 3.5–5.1)
Sodium: 142 mmol/L (ref 135–145)
Total Bilirubin: 0.8 mg/dL (ref 0.3–1.2)
Total Protein: 7 g/dL (ref 6.5–8.1)

## 2014-11-06 LAB — CBC
HCT: 42.5 % (ref 35.0–47.0)
Hemoglobin: 14.3 g/dL (ref 12.0–16.0)
MCH: 30.2 pg (ref 26.0–34.0)
MCHC: 33.6 g/dL (ref 32.0–36.0)
MCV: 89.9 fL (ref 80.0–100.0)
Platelets: 182 10*3/uL (ref 150–440)
RBC: 4.73 MIL/uL (ref 3.80–5.20)
RDW: 13.1 % (ref 11.5–14.5)
WBC: 6.1 10*3/uL (ref 3.6–11.0)

## 2014-11-06 LAB — T4, FREE: Free T4: 1.11 ng/dL (ref 0.61–1.12)

## 2014-11-06 LAB — LIPASE, BLOOD: Lipase: 50 U/L (ref 22–51)

## 2014-11-06 LAB — AMYLASE: Amylase: 84 U/L (ref 28–100)

## 2014-11-06 LAB — TSH: TSH: 0.624 u[IU]/mL (ref 0.350–4.500)

## 2014-11-06 LAB — VITAMIN B12: Vitamin B-12: 624 pg/mL (ref 180–914)

## 2014-11-19 ENCOUNTER — Encounter: Payer: Self-pay | Admitting: Emergency Medicine

## 2014-11-19 ENCOUNTER — Other Ambulatory Visit: Payer: Self-pay

## 2014-11-19 ENCOUNTER — Emergency Department
Admission: EM | Admit: 2014-11-19 | Discharge: 2014-11-19 | Disposition: A | Discharge status: Home / Self Care | Payer: Medicare Other | Attending: Emergency Medicine | Admitting: Emergency Medicine

## 2014-11-19 ENCOUNTER — Emergency Department: Payer: Medicare Other

## 2014-11-19 DIAGNOSIS — R4182 Altered mental status, unspecified: Secondary | ICD-10-CM | POA: Diagnosis present

## 2014-11-19 DIAGNOSIS — G20 Parkinson's disease: Secondary | ICD-10-CM | POA: Diagnosis not present

## 2014-11-19 DIAGNOSIS — I10 Essential (primary) hypertension: Secondary | ICD-10-CM | POA: Insufficient documentation

## 2014-11-19 DIAGNOSIS — N390 Urinary tract infection, site not specified: Secondary | ICD-10-CM | POA: Insufficient documentation

## 2014-11-19 DIAGNOSIS — E119 Type 2 diabetes mellitus without complications: Secondary | ICD-10-CM | POA: Diagnosis not present

## 2014-11-19 HISTORY — DX: Other pulmonary collapse: J98.19

## 2014-11-19 HISTORY — DX: Essential (primary) hypertension: I10

## 2014-11-19 HISTORY — DX: Parkinson's disease without dyskinesia, without mention of fluctuations: G20.A1

## 2014-11-19 HISTORY — DX: Parkinson's disease: G20

## 2014-11-19 HISTORY — DX: Type 2 diabetes mellitus without complications: E11.9

## 2014-11-19 LAB — URINALYSIS COMPLETE WITH MICROSCOPIC (ARMC ONLY)
Bilirubin Urine: NEGATIVE
Glucose, UA: NEGATIVE mg/dL
Nitrite: NEGATIVE
Protein, ur: 30 mg/dL — AB
Specific Gravity, Urine: 1.023 (ref 1.005–1.030)
Trans Epithel, UA: 1
pH: 5 (ref 5.0–8.0)

## 2014-11-19 LAB — COMPREHENSIVE METABOLIC PANEL
ALT: 6 U/L — ABNORMAL LOW (ref 14–54)
AST: 18 U/L (ref 15–41)
Albumin: 4 g/dL (ref 3.5–5.0)
Alkaline Phosphatase: 115 U/L (ref 38–126)
Anion gap: 6 (ref 5–15)
BUN: 27 mg/dL — ABNORMAL HIGH (ref 6–20)
CO2: 29 mmol/L (ref 22–32)
Calcium: 9.3 mg/dL (ref 8.9–10.3)
Chloride: 106 mmol/L (ref 101–111)
Creatinine, Ser: 1.22 mg/dL — ABNORMAL HIGH (ref 0.44–1.00)
GFR calc Af Amer: 49 mL/min — ABNORMAL LOW (ref >60)
GFR calc non Af Amer: 42 mL/min — ABNORMAL LOW (ref >60)
Glucose, Bld: 204 mg/dL — ABNORMAL HIGH (ref 65–99)
Potassium: 3.9 mmol/L (ref 3.5–5.1)
Sodium: 141 mmol/L (ref 135–145)
Total Bilirubin: 0.6 mg/dL (ref 0.3–1.2)
Total Protein: 6.2 g/dL — ABNORMAL LOW (ref 6.5–8.1)

## 2014-11-19 LAB — CBC
HCT: 41.7 % (ref 35.0–47.0)
Hemoglobin: 13.7 g/dL (ref 12.0–16.0)
MCH: 30 pg (ref 26.0–34.0)
MCHC: 32.8 g/dL (ref 32.0–36.0)
MCV: 91.5 fL (ref 80.0–100.0)
Platelets: 182 10*3/uL (ref 150–440)
RBC: 4.55 MIL/uL (ref 3.80–5.20)
RDW: 12.8 % (ref 11.5–14.5)
WBC: 6.1 10*3/uL (ref 3.6–11.0)

## 2014-11-19 LAB — GLUCOSE, CAPILLARY: Glucose-Capillary: 117 mg/dL — ABNORMAL HIGH (ref 65–99)

## 2014-11-19 LAB — TROPONIN I: Troponin I: <0.03 ng/mL (ref <0.031)

## 2014-11-19 MED ORDER — CEFTRIAXONE SODIUM IN DEXTROSE 20 MG/ML IV SOLN
1.0000 g | Freq: Once | INTRAVENOUS | Status: AC
  Administered 2014-11-19: 1 g via INTRAVENOUS

## 2014-11-19 MED ORDER — SODIUM CHLORIDE 0.9 % IV BOLUS (SEPSIS)
1000.0000 mL | Freq: Once | INTRAVENOUS | Status: AC
  Administered 2014-11-19: 1000 mL via INTRAVENOUS

## 2014-11-19 MED ORDER — CEPHALEXIN 500 MG PO CAPS: 500.0000 mg | ORAL_CAPSULE | Freq: Four times a day (QID) | ORAL | Status: DC

## 2014-11-19 MED ORDER — CEFTRIAXONE SODIUM IN DEXTROSE 20 MG/ML IV SOLN
INTRAVENOUS | Status: AC
  Administered 2014-11-19: 1 g via INTRAVENOUS
  Filled 2014-11-19: qty 50

## 2014-11-19 MED ORDER — CARBIDOPA-LEVODOPA 25-100 MG PO TABS
1.0000 | ORAL_TABLET | Freq: Three times a day (TID) | ORAL | Status: DC
  Administered 2014-11-19: 1 via ORAL
  Filled 2014-11-19 (×4): qty 1

## 2014-11-19 NOTE — ED Provider Notes (Signed)
----------------------------------------- 7:09 PM on 11/19/2014 -----------------------------------------  [facility] Emergency Department Provider Note  ____________________________________________  Time seen: Approximately 640 PM  I have reviewed the triage vital signs and the nursing notes.   HISTORY  Chief Complaint Altered Mental Status    HPI Jacqueline [name] is a 79 y.o. female is been having increasing weakness for about a week's time. The patient and her daughters provide history. She's been tired recently, slightly less active, and was at the beach over the weekend. Yesterday while driving home she was slightly confused and had trouble identifying her daughter. This improved and she is not been confused today per the patient and her family. She has had blood sugars ranging from about 200-300 and the last day. She is not always highly compliant with her medicines, but overall she's been doing okay today. She called her primary care doctor who suggested she come to the ER because they felt they were concerned about her confusion.  He denies fevers or chills. No nausea or vomiting. She is not a weakness in one arm or leg. She is not a numbness or tingling or drooping or face. She does walk with a cane at baseline, and has been doing okay with that. No headaches. No falls or injuries.   Past Medical History  Diagnosis Date  . Diabetes mellitus without complication   . Hypertension   . Collapsed lung   . Parkinson's disease     There are no active problems to display for this patient.   Past Surgical History  Procedure Laterality Date  . Cholecystectomy    . Abdominal hysterectomy      Current Outpatient Rx  Name  Route  Sig  Dispense  Refill  . cephALEXin (KEFLEX) 500 MG capsule   Oral   Take 1 capsule (500 mg total) by mouth 4 (four) times daily.   40 capsule   0     Allergies Review of patient's allergies indicates no known  allergies.  No family history on file.  Social History History  Substance Use Topics  . Smoking status: Never Smoker   . Smokeless tobacco: Not on file  . Alcohol Use: Not on file   no alcohol, no smoking  Review of Systems Constitutional: No fever/chills Eyes: No visual changes. ENT: No sore throat. Cardiovascular: Denies chest pain. Respiratory: Denies shortness of breath. Gastrointestinal: No abdominal pain.  No nausea, no vomiting.  No diarrhea.  No constipation. Genitourinary: Negative for dysuria. She has been urinating more frequently for about the last 2-3 days. Musculoskeletal: Negative for back pain. Skin: Negative for rash. Neurological: Negative for headaches, focal weakness or numbness.  Patient has had chronic and mild Parkinson's for about the last 3 years. She has not noticed any sudden changes.  10-point ROS otherwise negative.  She doesn't eat well. This is a chronic issue. For breakfast today she did eat her normal meal which is a piece of toast. She has not had dinner yet. ____________________________________________   PHYSICAL EXAM:  VITAL SIGNS: ED Triage Vitals  Enc Vitals Group     BP 11/19/14 1624 134/92 mmHg     Pulse Rate 11/19/14 1624 95     Resp 11/19/14 1624 20     Temp 11/19/14 1624 97.6 F (36.4 C)     Temp Source 11/19/14 1624 Oral     SpO2 11/19/14 1624 97 %     Weight 11/19/14 1624 104 lb (47.174 kg)     Height 11/19/14  1624 5' (1.524 m)     Head Cir --      Peak Flow --      Pain Score 11/19/14 1625 0     Pain Loc --      Pain Edu? --      Excl. in [place]? --     Constitutional: Alert and oriented. Well appearing and in no acute distress. She does have a Parkinsonian appearance, and a moderate right-sided parkinsonian tremor. Eyes: Conjunctivae are normal. PERRL. EOMI. Head: Atraumatic. Nose: No congestion/rhinnorhea. Mouth/Throat: Mucous membranes are slightly dry.  Oropharynx non-erythematous. Neck: No stridor.    Cardiovascular: Normal rate, regular rhythm. Grossly normal heart sounds.  Good peripheral circulation. Respiratory: Normal respiratory effort.  No retractions. Lungs CTAB. Gastrointestinal: Soft and nontender. No distention. No abdominal bruits. No CVA tenderness. Musculoskeletal: No lower extremity tenderness nor edema.  No joint effusions. Neurologic:  Speech and language are slightly slow and quiet, which appears to be consistent with her Parkinson's. No gross focal neurologic deficits are appreciated. Speech is normal. There is no pronator drift. 5 of 5 strength in all extremities. Cranial nerves normal. Skin:  Skin is warm, dry and intact. No rash noted. Psychiatric: Mood and affect are normal. Speech and behavior are normal. She is currently well oriented to place, situation, both daughters, date, month, and year.  ____________________________________________   LABS (all labs ordered are listed, but only abnormal results are displayed)  Labs Reviewed  COMPREHENSIVE METABOLIC PANEL - Abnormal; Notable for the following:    Glucose, Bld 204 (*)    BUN 27 (*)    Creatinine, Ser 1.22 (*)    Total Protein 6.2 (*)    ALT 6 (*)    GFR calc non Af Amer 42 (*)    GFR calc Af Amer 49 (*)    All other components within normal limits  URINALYSIS COMPLETEWITH MICROSCOPIC ([facility] ONLY) - Abnormal; Notable for the following:    Color, Urine AMBER (*)    APPearance HAZY (*)    Ketones, ur TRACE (*)    Hgb urine dipstick 1+ (*)    Protein, ur 30 (*)    Leukocytes, UA 2+ (*)    Bacteria, UA RARE (*)    Squamous Epithelial / LPF 6-30 (*)    All other components within normal limits  CBC  TROPONIN I  CBG MONITORING, ED   ____________________________________________  EKG  ED ECG REPORT I, QUALE, MARK, the attending physician, personally viewed and interpreted this ECG.  Date: 11/19/2014 EKG Time: 1650 Rate: 95 Rhythm: normal sinus rhythm QRS Axis: normal Intervals: normal ST/T  Wave abnormalities: normal Conduction Disutrbances: none Narrative Interpretation: unremarkable  ____________________________________________  RADIOLOGY     CT Head Wo Contrast (Final result) Result time: 11/19/14 17:01:23   Final result by Rad Results In Interface (11/19/14 17:01:23)   Narrative:   CLINICAL DATA: 79 year old diabetic hypertensive female with history of Parkinson's disease. 3 hr episode of confusion last evening and unsteady gait today. Initial encounter.  EXAM: CT HEAD WITHOUT CONTRAST  TECHNIQUE: Contiguous axial images were obtained from the base of the skull through the vertex without intravenous contrast.  COMPARISON: 02/14/2014.  FINDINGS: No intracranial hemorrhage.  Small vessel disease type changes without CT evidence of large acute infarct.  No intracranial mass lesion noted on this unenhanced exam.  No hydrocephalus or significant atrophy.  No calvarial abnormality.  IMPRESSION: Small vessel disease type changes without CT evidence of large acute infarct.  No intracranial hemorrhage.  ____________________________________________   PROCEDURES  Procedure(s) performed: None  Critical Care performed: No  ____________________________________________   INITIAL IMPRESSION / ASSESSMENT AND PLAN / ED COURSE  Pertinent labs & imaging results that were available during my care of the patient were reviewed by me and considered in my medical decision making (see chart for details).  Patient presents with a brief episode of confusion in the setting of increasing fatigue over the last week. Her exam now is reassuring her mental status is at baseline. She has reported having increased urinary frequency and urgency over the last few days. Of review of her labs demonstrates what appears to be a urinary tract infection, her head CT is normal her EKG and the remainder of her exam are normal with the exception of her chronic  Parkinson's.  Overall very reassuring. I suspect she may have a component of some mild dehydration due to poor diet as well. I discussed with the family who is very tentative, and we will plan to give her ceftriaxone and treat for UTI, they will follow up with Dr. Chancy Milroy her primary care in the next 1-2 days for reevaluation. We will hydrate her well and reevaluate prior to discharge. Does appear that she is stable and capable of outpatient therapy at this time.  ----------------------------------------- 9:29 PM on 11/19/2014 -----------------------------------------  Patient reports improvement. Antibiotic some fluids complete. She is fully awake and alert, and appears in no distress. We'll discharge to home on Keflex and follow-up with Dr. Humphrey Rolls next 1-2 days for reevaluation. Return precautions discussed with patient and her family. ____________________________________________   FINAL CLINICAL IMPRESSION(S) / ED DIAGNOSES  Final diagnoses:  Lower urinary tract infection, acute      Delman Kitten, MD 11/19/14 2130

## 2014-11-19 NOTE — Discharge Instructions (Signed)
Urinary Tract Infection  Please make sure to follow up with Dr. Chancy Milroy tomorrow. In addition, is very important that she come back to the ER should you redevelop confusion, have worsening weakness, fever, develop nausea or vomiting, chills, or other new concerns arise.  Urinary tract infections (UTIs) can develop anywhere along your urinary tract. Your urinary tract is your body's drainage system for removing wastes and extra water. Your urinary tract includes two kidneys, two ureters, a bladder, and a urethra. Your kidneys are a pair of bean-shaped organs. Each kidney is about the size of your fist. They are located below your ribs, one on each side of your spine. CAUSES Infections are caused by microbes, which are microscopic organisms, including fungi, viruses, and bacteria. These organisms are so small that they can only be seen through a microscope. Bacteria are the microbes that most commonly cause UTIs. SYMPTOMS  Symptoms of UTIs may vary by age and gender of the patient and by the location of the infection. Symptoms in young women typically include a frequent and intense urge to urinate and a painful, burning feeling in the bladder or urethra during urination. Older women and men are more likely to be tired, shaky, and weak and have muscle aches and abdominal pain. A fever may mean the infection is in your kidneys. Other symptoms of a kidney infection include pain in your back or sides below the ribs, nausea, and vomiting. DIAGNOSIS To diagnose a UTI, your caregiver will ask you about your symptoms. Your caregiver also will ask to provide a urine sample. The urine sample will be tested for bacteria and white blood cells. White blood cells are made by your body to help fight infection. TREATMENT  Typically, UTIs can be treated with medication. Because most UTIs are caused by a bacterial infection, they usually can be treated with the use of antibiotics. The choice of antibiotic and length of  treatment depend on your symptoms and the type of bacteria causing your infection. HOME CARE INSTRUCTIONS  If you were prescribed antibiotics, take them exactly as your caregiver instructs you. Finish the medication even if you feel better after you have only taken some of the medication.  Drink enough water and fluids to keep your urine clear or pale yellow.  Avoid caffeine, tea, and carbonated beverages. They tend to irritate your bladder.  Empty your bladder often. Avoid holding urine for long periods of time.  Empty your bladder before and after sexual intercourse.  After a bowel movement, women should cleanse from front to back. Use each tissue only once. SEEK MEDICAL CARE IF:   You have back pain.  You develop a fever.  Your symptoms do not begin to resolve within 3 days. SEEK IMMEDIATE MEDICAL CARE IF:   You have severe back pain or lower abdominal pain.  You develop chills.  You have nausea or vomiting.  You have continued burning or discomfort with urination. MAKE SURE YOU:   Understand these instructions.  Will watch your condition.  Will get help right away if you are not doing well or get worse. Document Released: 03/04/2005 Document Revised: 11/24/2011 Document Reviewed: 07/03/2011 [facility] Patient Information 2015 [place], [place]. This information is not intended to replace advice given to you by your health care provider. Make sure you discuss any questions you have with your health care provider.

## 2014-11-19 NOTE — ED Notes (Signed)
pts daughter states since the diagnosis of parkinson's pt has not had a sense of balance and states last night she was very confused, unsure of who her daughters were, upon assessment pt able to state name, situation, place and date, pts daughter states she has been sleeping a lot more lately, upon assessment pt demonstrates some mild uncontrollable movements, sticking her tongue out

## 2014-11-19 NOTE — ED Notes (Signed)
Pts daughter states pt is scheduled for [place] this week of pelvis, abdomen, and thyroid due to increased dizziness, weight loss and weakness
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
## 2014-11-19 NOTE — ED Notes (Signed)
Pt resting on stretcher with family at the bedside. MD at the bedside to review plan of care. No distress noted at this time.

## 2014-11-19 NOTE — ED Notes (Signed)
Had episode of confusion yesterday, daughter attributed it to being tired, less confused today, can state year, knows daughter, denies recent head injury

## 2014-12-14 ENCOUNTER — Other Ambulatory Visit: Payer: Self-pay | Admitting: Physician Assistant

## 2014-12-17 ENCOUNTER — Other Ambulatory Visit: Payer: Self-pay | Admitting: Physician Assistant

## 2014-12-17 DIAGNOSIS — R634 Abnormal weight loss: Secondary | ICD-10-CM

## 2014-12-17 DIAGNOSIS — K529 Noninfective gastroenteritis and colitis, unspecified: Secondary | ICD-10-CM

## 2014-12-19 ENCOUNTER — Ambulatory Visit
Admission: RE | Admit: 2014-12-19 | Discharge: 2014-12-19 | Disposition: A | Discharge status: Home / Self Care | Payer: Medicare Other | Source: Ambulatory Visit | Attending: Physician Assistant | Admitting: Physician Assistant

## 2014-12-19 DIAGNOSIS — R634 Abnormal weight loss: Secondary | ICD-10-CM | POA: Diagnosis present

## 2014-12-19 DIAGNOSIS — N289 Disorder of kidney and ureter, unspecified: Secondary | ICD-10-CM | POA: Diagnosis not present

## 2014-12-19 DIAGNOSIS — K529 Noninfective gastroenteritis and colitis, unspecified: Secondary | ICD-10-CM | POA: Insufficient documentation

## 2014-12-19 DIAGNOSIS — I517 Cardiomegaly: Secondary | ICD-10-CM | POA: Insufficient documentation

## 2014-12-19 MED ORDER — IOHEXOL 300 MG/ML  SOLN
75.0000 mL | Freq: Once | INTRAMUSCULAR | Status: AC | PRN
  Administered 2014-12-19: 100 mL via INTRAVENOUS

## 2014-12-26 ENCOUNTER — Other Ambulatory Visit: Payer: Self-pay | Admitting: Physician Assistant

## 2014-12-26 DIAGNOSIS — R935 Abnormal findings on diagnostic imaging of other abdominal regions, including retroperitoneum: Secondary | ICD-10-CM

## 2015-01-02 ENCOUNTER — Ambulatory Visit
Admission: RE | Admit: 2015-01-02 | Discharge: 2015-01-02 | Disposition: A | Discharge status: Home / Self Care | Payer: Medicare Other | Source: Ambulatory Visit | Attending: Physician Assistant | Admitting: Physician Assistant

## 2015-01-02 DIAGNOSIS — R935 Abnormal findings on diagnostic imaging of other abdominal regions, including retroperitoneum: Secondary | ICD-10-CM | POA: Diagnosis present

## 2015-01-02 DIAGNOSIS — Z90710 Acquired absence of both cervix and uterus: Secondary | ICD-10-CM | POA: Insufficient documentation

## 2015-02-12 DIAGNOSIS — E119 Type 2 diabetes mellitus without complications: Secondary | ICD-10-CM | POA: Insufficient documentation

## 2015-03-01 ENCOUNTER — Encounter: Admission: RE | Payer: Self-pay | Source: Ambulatory Visit

## 2015-03-01 ENCOUNTER — Ambulatory Visit: Admission: RE | Admit: 2015-03-01 | Payer: Medicare Other | Source: Ambulatory Visit | Admitting: Gastroenterology

## 2015-03-01 SURGERY — COLONOSCOPY WITH PROPOFOL
Anesthesia: General

## 2015-03-28 ENCOUNTER — Encounter: Payer: Self-pay | Admitting: *Deleted

## 2015-03-29 ENCOUNTER — Ambulatory Visit: Payer: Medicare Other | Admitting: Anesthesiology

## 2015-03-29 ENCOUNTER — Encounter
Admission: RE | Disposition: A | Discharge status: Home / Self Care | Payer: Self-pay | Source: Ambulatory Visit | Attending: Gastroenterology

## 2015-03-29 ENCOUNTER — Ambulatory Visit
Admission: RE | Admit: 2015-03-29 | Discharge: 2015-03-29 | Disposition: A | Discharge status: Home / Self Care | Payer: Medicare Other | Source: Ambulatory Visit | Attending: Gastroenterology | Admitting: Gastroenterology

## 2015-03-29 ENCOUNTER — Encounter: Payer: Self-pay | Admitting: Anesthesiology

## 2015-03-29 DIAGNOSIS — Z794 Long term (current) use of insulin: Secondary | ICD-10-CM | POA: Diagnosis not present

## 2015-03-29 DIAGNOSIS — K5730 Diverticulosis of large intestine without perforation or abscess without bleeding: Secondary | ICD-10-CM | POA: Diagnosis not present

## 2015-03-29 DIAGNOSIS — I10 Essential (primary) hypertension: Secondary | ICD-10-CM | POA: Diagnosis not present

## 2015-03-29 DIAGNOSIS — Z681 Body mass index (BMI) 19 or less, adult: Secondary | ICD-10-CM | POA: Insufficient documentation

## 2015-03-29 DIAGNOSIS — E1165 Type 2 diabetes mellitus with hyperglycemia: Secondary | ICD-10-CM | POA: Insufficient documentation

## 2015-03-29 DIAGNOSIS — K219 Gastro-esophageal reflux disease without esophagitis: Secondary | ICD-10-CM | POA: Diagnosis not present

## 2015-03-29 DIAGNOSIS — R630 Anorexia: Secondary | ICD-10-CM | POA: Insufficient documentation

## 2015-03-29 DIAGNOSIS — G20 Parkinson's disease: Secondary | ICD-10-CM | POA: Insufficient documentation

## 2015-03-29 DIAGNOSIS — E785 Hyperlipidemia, unspecified: Secondary | ICD-10-CM | POA: Insufficient documentation

## 2015-03-29 DIAGNOSIS — Z79899 Other long term (current) drug therapy: Secondary | ICD-10-CM | POA: Diagnosis not present

## 2015-03-29 DIAGNOSIS — Z9049 Acquired absence of other specified parts of digestive tract: Secondary | ICD-10-CM | POA: Diagnosis not present

## 2015-03-29 DIAGNOSIS — E039 Hypothyroidism, unspecified: Secondary | ICD-10-CM | POA: Insufficient documentation

## 2015-03-29 DIAGNOSIS — J45909 Unspecified asthma, uncomplicated: Secondary | ICD-10-CM | POA: Diagnosis not present

## 2015-03-29 DIAGNOSIS — R634 Abnormal weight loss: Secondary | ICD-10-CM | POA: Diagnosis present

## 2015-03-29 DIAGNOSIS — Z90710 Acquired absence of both cervix and uterus: Secondary | ICD-10-CM | POA: Diagnosis not present

## 2015-03-29 DIAGNOSIS — D120 Benign neoplasm of cecum: Secondary | ICD-10-CM | POA: Diagnosis not present

## 2015-03-29 DIAGNOSIS — D123 Benign neoplasm of transverse colon: Secondary | ICD-10-CM | POA: Insufficient documentation

## 2015-03-29 HISTORY — DX: Unspecified asthma, uncomplicated: J45.909

## 2015-03-29 HISTORY — DX: Hypothyroidism, unspecified: E03.9

## 2015-03-29 HISTORY — PX: ESOPHAGOGASTRODUODENOSCOPY (EGD) WITH PROPOFOL: SHX5813

## 2015-03-29 HISTORY — PX: COLONOSCOPY WITH PROPOFOL: SHX5780

## 2015-03-29 HISTORY — DX: Gastro-esophageal reflux disease without esophagitis: K21.9

## 2015-03-29 HISTORY — DX: Hyperlipidemia, unspecified: E78.5

## 2015-03-29 LAB — GLUCOSE, CAPILLARY: Glucose-Capillary: 89 mg/dL (ref 65–99)

## 2015-03-29 SURGERY — COLONOSCOPY WITH PROPOFOL
Anesthesia: General

## 2015-03-29 MED ORDER — SODIUM CHLORIDE 0.9 % IV SOLN
INTRAVENOUS | Status: DC
  Administered 2015-03-29: 1000 mL via INTRAVENOUS

## 2015-03-29 MED ORDER — PROPOFOL 500 MG/50ML IV EMUL
INTRAVENOUS | Status: DC | PRN
Start: 2015-03-29 — End: 2015-03-29
  Administered 2015-03-29: 100 ug/kg/min via INTRAVENOUS

## 2015-03-29 MED ORDER — SODIUM CHLORIDE 0.9 % IV SOLN: INTRAVENOUS | Status: DC

## 2015-03-29 MED ORDER — PROPOFOL 10 MG/ML IV BOLUS
INTRAVENOUS | Status: DC | PRN
  Administered 2015-03-29: 30 mg via INTRAVENOUS
  Administered 2015-03-29: 20 mg via INTRAVENOUS
  Administered 2015-03-29: 50 mg via INTRAVENOUS

## 2015-03-29 MED ORDER — LIDOCAINE HCL (CARDIAC) 20 MG/ML IV SOLN
INTRAVENOUS | Status: DC | PRN
  Administered 2015-03-29: 50 mg via INTRAVENOUS

## 2015-03-29 NOTE — H&P (Signed)
Primary Care Physician:  Lavera Guise, MD Primary Gastroenterologist:  Dr. Candace Cruise  Pre-Procedure History & Physical: HPI:  Jacqueline [name] is a 79 y.o. female is here for an EGD/colonoscopy.   Past Medical History  Diagnosis Date  . Hypertension   . Collapsed lung   . Parkinson's disease ([place])   . Diabetes mellitus without complication ([place])     Patient takes Insulin twice a day.   . Hypothyroidism   . GERD (gastroesophageal reflux disease)   . Reactive airway disease   . Asthma   . Hyperlipidemia     Past Surgical History  Procedure Laterality Date  . Cholecystectomy    . Abdominal hysterectomy      Prior to Admission medications   Medication Sig Start Date End Date Taking? Authorizing Provider  aspirin EC 81 MG tablet Take 81 mg by mouth daily.   Yes Historical Provider, MD  budesonide-formoterol (SYMBICORT) 80-4.5 MCG/ACT inhaler Inhale 2 puffs into the lungs 2 (two) times daily.   Yes Historical Provider, MD  calcium-vitamin D (OSCAL-500) 500-400 MG-UNIT tablet Take 1 tablet by mouth 2 (two) times daily.   Yes Historical Provider, MD  Carbidopa-Levodopa ER (SINEMET CR) 25-100 MG tablet controlled release Take 1 tablet by mouth 2 (two) times daily.   Yes Historical Provider, MD  Cholecalciferol 2000 UNITS TABS Take 2,000 Units by mouth daily.   Yes Historical Provider, MD  donepezil (ARICEPT) 10 MG tablet Take 10 mg by mouth at bedtime.   Yes Historical Provider, MD  insulin lispro protamine-lispro (HUMALOG 75/25 MIX) (75-25) 100 UNIT/ML SUSP injection Inject 4 Units into the skin 2 (two) times daily with a meal.   Yes Historical Provider, MD  levothyroxine (SYNTHROID, LEVOTHROID) 50 MCG tablet Take 50 mcg by mouth daily before breakfast.   Yes Historical Provider, MD  omeprazole (PRILOSEC) 40 MG capsule Take 40 mg by mouth daily.   Yes Historical Provider, MD  rasagiline (AZILECT) 1 MG TABS tablet Take 1 mg by mouth daily.   Yes Historical Provider, MD  rOPINIRole  (REQUIP) 0.5 MG tablet Take 0.5 mg by mouth at bedtime.   Yes Historical Provider, MD  cephALEXin (KEFLEX) 500 MG capsule Take 1 capsule (500 mg total) by mouth 4 (four) times daily. Patient not taking: Reported on 03/28/2015 11/19/14   Delman Kitten, MD    Allergies as of 03/28/2015  . (No Known Allergies)    History reviewed. No pertinent family history.  Social History   Social History  . Marital Status: Widowed    Spouse Name: N/A  . Number of Children: N/A  . Years of Education: N/A   Occupational History  . Not on file.   Social History Main Topics  . Smoking status: Never Smoker   . Smokeless tobacco: Never Used  . Alcohol Use: No  . Drug Use: No  . Sexual Activity: Not on file   Other Topics Concern  . Not on file   Social History Narrative    Review of Systems: See HPI, otherwise negative ROS  Physical Exam: There were no vitals taken for this visit. General:   Alert,  pleasant and cooperative in NAD Head:  Normocephalic and atraumatic. Neck:  Supple; no masses or thyromegaly. Lungs:  Clear throughout to auscultation.    Heart:  Regular rate and rhythm. Abdomen:  Soft, nontender and nondistended. Normal bowel sounds, without guarding, and without rebound.   Neurologic:  Alert and  oriented x4;  grossly normal neurologically.  Impression/Plan: Jacqueline [name] is here for an EGD/colonoscopy to be performed for wt loss/dysphagia.  Risks, benefits, limitations, and alternatives regarding EGD/colonoscopy have been reviewed with the patient.  Questions have been answered.  All parties agreeable.   Allex Madia, Lupita Dawn, MD  03/29/2015, 8:24 AM

## 2015-03-29 NOTE — Op Note (Signed)
[facility] Gastroenterology Patient Name: Jacqueline [name] Procedure Date: 03/29/2015 9:51 AM MRN: [id number] Account #: [id number] Date of Birth: 1936/04/21 Admit Type: Outpatient Age: 79 Room: [facility] ENDO ROOM 4 Gender: Female Note Status: Finalized Procedure:         Upper GI endoscopy Indications:       Anorexia, Weight loss Providers:         Lupita Dawn. Candace Cruise, MD Referring MD:      Lavera Guise, MD (Referring MD) Medicines:         Monitored Anesthesia Care Complications:     No immediate complications. Procedure:         Pre-Anesthesia Assessment:                    - Prior to the procedure, a History and Physical was                     performed, and patient medications, allergies and                     sensitivities were reviewed. The patient's tolerance of                     previous anesthesia was reviewed.                    - The risks and benefits of the procedure and the sedation                     options and risks were discussed with the patient. All                     questions were answered and informed consent was obtained.                    - After reviewing the risks and benefits, the patient was                     deemed in satisfactory condition to undergo the procedure.                    After obtaining informed consent, the endoscope was passed                     under direct vision. Throughout the procedure, the                     patient's blood pressure, pulse, and oxygen saturations                     were monitored continuously. The Endoscope was introduced                     through the mouth, and advanced to the second part of                     duodenum. The upper GI endoscopy was accomplished without                     difficulty. The patient tolerated the procedure well. Findings:      The examined esophagus was normal.      The entire examined stomach was normal.      The examined duodenum was normal. Impression:         -  Normal esophagus.                    - Normal stomach.                    - Normal examined duodenum.                    - No specimens collected. Recommendation:    - Discharge patient to home.                    - Observe patient's clinical course.                    - The findings and recommendations were discussed with the                     patient. Procedure Code(s): --- Professional ---                    [phone], Esophagogastroduodenoscopy, flexible, transoral;                     diagnostic, including collection of specimen(s) by                     brushing or washing, when performed (separate procedure) Diagnosis Code(s): --- Professional ---                    R63.0, Anorexia                    R63.4, Abnormal weight loss CPT copyright 2014 American Medical Association. All rights reserved. The codes documented in this report are preliminary and upon coder review may  be revised to meet current compliance requirements. Hulen Luster, MD 03/29/2015 10:00:01 AM This report has been signed electronically. Number of Addenda: 0 Note Initiated On: 03/29/2015 9:51 AM      [facility]

## 2015-03-29 NOTE — Anesthesia Preprocedure Evaluation (Signed)
Anesthesia Evaluation  Patient identified by MRN, date of birth, ID band Patient awake    Reviewed: Allergy & Precautions, H&P , NPO status , Patient's Chart, lab work & pertinent test results, reviewed documented beta blocker date and time   History of Anesthesia Complications Negative for: history of anesthetic complications  Airway Mallampati: III  TM Distance: >3 FB Neck ROM: full    Dental no notable dental hx. (+) Teeth Intact   Pulmonary shortness of breath and with exertion, asthma , neg sleep apnea, neg COPD, neg recent URI,    Pulmonary exam normal breath sounds clear to auscultation       Cardiovascular Exercise Tolerance: Good hypertension, On Medications (-) angina(-) CAD, (-) Past MI, (-) Cardiac Stents and (-) CABG Normal cardiovascular exam(-) dysrhythmias (-) Valvular Problems/Murmurs Rhythm:regular Rate:Normal     Neuro/Psych neg Seizures  Neuromuscular disease (Parkinson's disease) negative psych ROS   GI/Hepatic Neg liver ROS, GERD  Medicated and Controlled,  Endo/Other  diabetes, Poorly Controlled, Insulin DependentHypothyroidism   Renal/GU negative Renal ROS  negative genitourinary   Musculoskeletal   Abdominal   Peds  Hematology negative hematology ROS (+)   Anesthesia Other Findings Past Medical History:   Hypertension                                                 Collapsed lung                                               Parkinson's disease ([place])                                    Diabetes mellitus without complication ([place])                   Comment:Patient takes Insulin twice a day.    Hypothyroidism                                               GERD (gastroesophageal reflux disease)                       Reactive airway disease                                      Asthma                                                       Hyperlipidemia                                                Reproductive/Obstetrics negative OB ROS  Anesthesia Physical Anesthesia Plan  ASA: III  Anesthesia Plan: General   Post-op Pain Management:    Induction:   Airway Management Planned:   Additional Equipment:   Intra-op Plan:   Post-operative Plan:   Informed Consent: I have reviewed the patients History and Physical, chart, labs and discussed the procedure including the risks, benefits and alternatives for the proposed anesthesia with the patient or authorized representative who has indicated his/her understanding and acceptance.   Dental Advisory Given  Plan Discussed with: Anesthesiologist, CRNA and Surgeon  Anesthesia Plan Comments:         Anesthesia Quick Evaluation

## 2015-03-29 NOTE — Op Note (Signed)
[facility] Gastroenterology Patient Name: Jacqueline [name] Procedure Date: 03/29/2015 9:50 AM MRN: [id number] Account #: [id number] Date of Birth: 30-Jul-1935 Admit Type: Outpatient Age: 79 Room: [facility] ENDO ROOM 4 Gender: Female Note Status: Finalized Procedure:         Colonoscopy Indications:       Weight loss Providers:         Lupita Dawn. Candace Cruise, MD Referring MD:      Lavera Guise, MD (Referring MD) Medicines:         Monitored Anesthesia Care Complications:     No immediate complications. Procedure:         Pre-Anesthesia Assessment:                    - Prior to the procedure, a History and Physical was                     performed, and patient medications, allergies and                     sensitivities were reviewed. The patient's tolerance of                     previous anesthesia was reviewed.                    - The risks and benefits of the procedure and the sedation                     options and risks were discussed with the patient. All                     questions were answered and informed consent was obtained.                    - After reviewing the risks and benefits, the patient was                     deemed in satisfactory condition to undergo the procedure.                    After obtaining informed consent, the colonoscope was                     passed under direct vision. Throughout the procedure, the                     patient's blood pressure, pulse, and oxygen saturations                     were monitored continuously. The Colonoscope was                     introduced through the anus and advanced to the the cecum,                     identified by appendiceal orifice and ileocecal valve. The                     colonoscopy was performed without difficulty. The patient                     tolerated the procedure well. The quality of the bowel  preparation was good. Findings:      A diminutive polyp was found at the  splenic flexure. The polyp was       sessile. The polyp was removed with a jumbo cold forceps. Resection and       retrieval were complete.      A medium polyp was found in the cecum. The polyp was sessile. The polyp       was removed with a hot snare. Resection and retrieval were complete. Two       hemostatic clips were successfully placed. There was no bleeding at the       end of the procedure.      The exam was otherwise without abnormality.      Multiple small and large-mouthed diverticula were found in the sigmoid       colon. Impression:        - One diminutive polyp at the splenic flexure. Resected                     and retrieved.                    - One medium polyp in the cecum. Resected and retrieved.                     Clips were placed.                    - The examination was otherwise normal.                    - Diverticulosis in the sigmoid colon. Recommendation:    - Discharge patient to home.                    - Await pathology results.                    - Repeat colonoscopy in 3 years for surveillance based on                     pathology results.                    - The findings and recommendations were discussed with the                     patient. Procedure Code(s): --- Professional ---                    [phone], Colonoscopy, flexible; with removal of tumor(s),                     polyp(s), or other lesion(s) by snare technique                    [id number], 59, Colonoscopy, flexible; with biopsy, single or                     multiple Diagnosis Code(s): --- Professional ---                    D12.3, Benign neoplasm of transverse colon                    R63.4, Abnormal weight loss                    D12.0, Benign neoplasm  of cecum                    K57.30, Diverticulosis of large intestine without                     perforation or abscess without bleeding CPT copyright 2014 American Medical Association. All rights reserved. The codes documented in this  report are preliminary and upon coder review may  be revised to meet current compliance requirements. Hulen Luster, MD 03/29/2015 10:23:07 AM This report has been signed electronically. Number of Addenda: 0 Note Initiated On: 03/29/2015 9:50 AM Scope Withdrawal Time: 0 hours 9 minutes 11 seconds  Total Procedure Duration: 0 hours 17 minutes 57 seconds       [facility]

## 2015-03-29 NOTE — Transfer of Care (Signed)
Immediate Anesthesia Transfer of Care Note  Patient: Jacqueline [name]  Procedure(s) Performed: Procedure(s): COLONOSCOPY WITH PROPOFOL (N/A) ESOPHAGOGASTRODUODENOSCOPY (EGD) WITH PROPOFOL (N/A)  Patient Location: Endoscopy Unit  Anesthesia Type:General  Level of Consciousness: awake  Airway & Oxygen Therapy: Patient Spontanous Breathing  Post-op Assessment: Report given to RN  Post vital signs: Reviewed  Last Vitals:  Filed Vitals:   03/29/15 1025  BP: 124/57  Pulse: 63  Temp: 35.7 C  Resp: 17    Complications: No apparent anesthesia complications

## 2015-04-01 LAB — SURGICAL PATHOLOGY

## 2015-04-01 NOTE — Anesthesia Postprocedure Evaluation (Signed)
  Anesthesia Post-op Note  Patient: Jacqueline [name]  Procedure(s) Performed: Procedure(s): COLONOSCOPY WITH PROPOFOL (N/A) ESOPHAGOGASTRODUODENOSCOPY (EGD) WITH PROPOFOL (N/A)  Anesthesia type:General  Patient location: PACU  Post pain: Pain level controlled  Post assessment: Post-op Vital signs reviewed, Patient's Cardiovascular Status Stable, Respiratory Function Stable, Patent Airway and No signs of Nausea or vomiting  Post vital signs: Reviewed and stable  Last Vitals:  Filed Vitals:   03/29/15 1100  BP: 151/79  Pulse: 69  Temp:   Resp: 21    Level of consciousness: awake, alert  and patient cooperative  Complications: No apparent anesthesia complications

## 2015-04-04 ENCOUNTER — Encounter: Payer: Self-pay | Admitting: Gastroenterology

## 2015-04-13 ENCOUNTER — Emergency Department: Payer: Medicare Other

## 2015-04-13 ENCOUNTER — Encounter: Payer: Self-pay | Admitting: Emergency Medicine

## 2015-04-13 ENCOUNTER — Emergency Department
Admission: EM | Admit: 2015-04-13 | Discharge: 2015-04-13 | Disposition: A | Discharge status: Home / Self Care | Payer: Medicare Other | Attending: Emergency Medicine | Admitting: Emergency Medicine

## 2015-04-13 DIAGNOSIS — I10 Essential (primary) hypertension: Secondary | ICD-10-CM | POA: Diagnosis not present

## 2015-04-13 DIAGNOSIS — E11649 Type 2 diabetes mellitus with hypoglycemia without coma: Secondary | ICD-10-CM | POA: Diagnosis not present

## 2015-04-13 DIAGNOSIS — Z792 Long term (current) use of antibiotics: Secondary | ICD-10-CM | POA: Insufficient documentation

## 2015-04-13 DIAGNOSIS — Z794 Long term (current) use of insulin: Secondary | ICD-10-CM | POA: Insufficient documentation

## 2015-04-13 DIAGNOSIS — E119 Type 2 diabetes mellitus without complications: Secondary | ICD-10-CM | POA: Insufficient documentation

## 2015-04-13 DIAGNOSIS — M62838 Other muscle spasm: Secondary | ICD-10-CM | POA: Insufficient documentation

## 2015-04-13 DIAGNOSIS — Z79899 Other long term (current) drug therapy: Secondary | ICD-10-CM | POA: Insufficient documentation

## 2015-04-13 DIAGNOSIS — Z7982 Long term (current) use of aspirin: Secondary | ICD-10-CM | POA: Diagnosis not present

## 2015-04-13 DIAGNOSIS — E162 Hypoglycemia, unspecified: Secondary | ICD-10-CM

## 2015-04-13 DIAGNOSIS — M79602 Pain in left arm: Secondary | ICD-10-CM | POA: Diagnosis present

## 2015-04-13 LAB — CBC
HCT: 41.9 % (ref 35.0–47.0)
Hemoglobin: 14.1 g/dL (ref 12.0–16.0)
MCH: 30.3 pg (ref 26.0–34.0)
MCHC: 33.7 g/dL (ref 32.0–36.0)
MCV: 89.7 fL (ref 80.0–100.0)
Platelets: 206 10*3/uL (ref 150–440)
RBC: 4.67 MIL/uL (ref 3.80–5.20)
RDW: 13.5 % (ref 11.5–14.5)
WBC: 8.8 10*3/uL (ref 3.6–11.0)

## 2015-04-13 LAB — BASIC METABOLIC PANEL
Anion gap: 6 (ref 5–15)
BUN: 33 mg/dL — ABNORMAL HIGH (ref 6–20)
CO2: 27 mmol/L (ref 22–32)
Calcium: 9.8 mg/dL (ref 8.9–10.3)
Chloride: 106 mmol/L (ref 101–111)
Creatinine, Ser: 1.09 mg/dL — ABNORMAL HIGH (ref 0.44–1.00)
GFR calc Af Amer: 55 mL/min — ABNORMAL LOW (ref >60)
GFR calc non Af Amer: 48 mL/min — ABNORMAL LOW (ref >60)
Glucose, Bld: 55 mg/dL — ABNORMAL LOW (ref 65–99)
Potassium: 3.6 mmol/L (ref 3.5–5.1)
Sodium: 139 mmol/L (ref 135–145)

## 2015-04-13 LAB — GLUCOSE, CAPILLARY
Glucose-Capillary: 31 mg/dL — CL (ref 65–99)
Glucose-Capillary: 36 mg/dL — CL (ref 65–99)
Glucose-Capillary: 79 mg/dL (ref 65–99)
Glucose-Capillary: 96 mg/dL (ref 65–99)

## 2015-04-13 LAB — TROPONIN I
Troponin I: <0.03 ng/mL (ref <0.031)
Troponin I: <0.03 ng/mL (ref <0.031)

## 2015-04-13 MED ORDER — ACETAMINOPHEN 325 MG PO TABS
650.0000 mg | ORAL_TABLET | Freq: Once | ORAL | Status: AC
  Administered 2015-04-13: 650 mg via ORAL
  Filled 2015-04-13: qty 2

## 2015-04-13 NOTE — ED Notes (Signed)
Pt from triage, pt diaphoretic and lethargic but arousable and responsive.  Drank 8oz of orange juice and is now eating crackers.

## 2015-04-13 NOTE — ED Provider Notes (Addendum)
[facility] Emergency Department Provider Note  ____________________________________________   I have reviewed the triage vital signs and the nursing notes.   HISTORY  Chief Complaint Arm Pain    HPI Jacqueline [name] is a 79 y.o. female with a history of collapsed lung in the past Parkinson's disease diabetes mellitus reflux disease asthma and hyperlipidemia, patient also has a recurrent history of muscle spasms in the left trapezius region if she carries anything. The patient in fact is not allowed to carry her purse because of this. Today, she was at the [facility] and she was carrying a water bottle, she missed lunch even though she did have her insulin. And she started to feel the same cramping pain in her trapezius muscle that she always feels. She states it was a muscle spasm in the trapezius area. She changed the water bottle to the other hand then the pain seemed to release a little bit but still was present, and then she started to feel worse in general. She had no chest pain or shortness of breath no nausea no vomiting no abdominal pain. She did have pain when she lifted her arm up and she felt like there was a bad cramp there. This has happened to her many times and it always happens and she carries something heavy in her left hand.  Past Medical History  Diagnosis Date  . Hypertension   . Collapsed lung   . Parkinson's disease ([place])   . Diabetes mellitus without complication ([place])     Patient takes Insulin twice a day.   . Hypothyroidism   . GERD (gastroesophageal reflux disease)   . Reactive airway disease   . Asthma   . Hyperlipidemia     There are no active problems to display for this patient.   Past Surgical History  Procedure Laterality Date  . Cholecystectomy    . Abdominal hysterectomy    . Colonoscopy with propofol N/A 03/29/2015    Procedure: COLONOSCOPY WITH PROPOFOL;  Surgeon: Hulen Luster, MD;  Location: [facility] ENDOSCOPY;  Service:  Gastroenterology;  Laterality: N/A;  . Esophagogastroduodenoscopy (egd) with propofol N/A 03/29/2015    Procedure: ESOPHAGOGASTRODUODENOSCOPY (EGD) WITH PROPOFOL;  Surgeon: Hulen Luster, MD;  Location: [facility] ENDOSCOPY;  Service: Gastroenterology;  Laterality: N/A;    Current Outpatient Rx  Name  Route  Sig  Dispense  Refill  . aspirin EC 81 MG tablet   Oral   Take 81 mg by mouth daily.         . budesonide-formoterol (SYMBICORT) 80-4.5 MCG/ACT inhaler   Inhalation   Inhale 2 puffs into the lungs 2 (two) times daily.         . calcium-vitamin D (OSCAL-500) 500-400 MG-UNIT tablet   Oral   Take 1 tablet by mouth 2 (two) times daily.         . Carbidopa-Levodopa ER (SINEMET CR) 25-100 MG tablet controlled release   Oral   Take 1 tablet by mouth 2 (two) times daily.         . cephALEXin (KEFLEX) 500 MG capsule   Oral   Take 1 capsule (500 mg total) by mouth 4 (four) times daily.   40 capsule   0   . Cholecalciferol 2000 UNITS TABS   Oral   Take 2,000 Units by mouth daily.         Marland Kitchen donepezil (ARICEPT) 10 MG tablet   Oral   Take 10 mg by mouth at bedtime.         Marland Kitchen  insulin lispro protamine-lispro (HUMALOG 75/25 MIX) (75-25) 100 UNIT/ML SUSP injection   Subcutaneous   Inject 4 Units into the skin 2 (two) times daily with a meal.         . levothyroxine (SYNTHROID, LEVOTHROID) 50 MCG tablet   Oral   Take 50 mcg by mouth daily before breakfast.         . omeprazole (PRILOSEC) 40 MG capsule   Oral   Take 40 mg by mouth daily.         . rasagiline (AZILECT) 1 MG TABS tablet   Oral   Take 1 mg by mouth daily.         Marland Kitchen rOPINIRole (REQUIP) 0.5 MG tablet   Oral   Take 0.5 mg by mouth at bedtime.           Allergies Review of patient's allergies indicates no known allergies.  No family history on file.  Social History Social History  Substance Use Topics  . Smoking status: Never Smoker   . Smokeless tobacco: Never Used  . Alcohol Use: No     Review of Systems Constitutional: No fever/chills Eyes: No visual changes. ENT: No sore throat. No stiff neck no neck pain Cardiovascular: Denies chest pain. Respiratory: Denies shortness of breath. Gastrointestinal:   no vomiting.  No diarrhea.  No constipation. Genitourinary: Negative for dysuria. Musculoskeletal: Negative lower extremity swelling Skin: Negative for rash. Neurological: Negative for headaches, focal weakness or numbness. 10-point ROS otherwise negative.  ____________________________________________   PHYSICAL EXAM:  VITAL SIGNS: ED Triage Vitals  Enc Vitals Group     BP 04/13/15 1639 148/74 mmHg     Pulse Rate 04/13/15 1639 77     Resp 04/13/15 1639 18     Temp 04/13/15 1639 97.9 F (36.6 C)     Temp Source 04/13/15 1639 Oral     SpO2 04/13/15 1639 97 %     Weight 04/13/15 1639 104 lb (47.174 kg)     Height 04/13/15 1639 5\' 1"  (1.549 m)     Head Cir --      Peak Flow --      Pain Score 04/13/15 1640 9     Pain Loc --      Pain Edu? --      Excl. in [place]? --     Constitutional: Alert and oriented. Well appearing and in no acute distress. Eyes: Conjunctivae are normal. PERRL. EOMI. Head: Atraumatic. Nose: No congestion/rhinnorhea. Mouth/Throat: Mucous membranes are moist.  Oropharynx non-erythematous. Neck: No stridor.   No midline tenderness no meningismus, light touch the trapezius muscle, there is minimal muscles spasm noted and discomfort which is reproducible. There is also some pain to palpation in the rhomboid muscles next to the left scapula. All this reproduce the patient's pain. When I touch this area patient states "ouch that's the pain right there" and pulls back. There is no midline back pain. Cardiovascular: Normal rate, regular rhythm. Grossly normal heart sounds.  Good peripheral circulation. Respiratory: Normal respiratory effort.  No retractions. Lungs CTAB. Abdominal: Soft and nontender. No distention. No guarding no rebound Back:   There is no focal tenderness or step off there is no midline tenderness there are no lesions noted. there is no CVA tenderness Musculoskeletal: No lower extremity tenderness. No joint effusions, no DVT signs strong distal pulses no edema Neurologic:  Normal speech and language. No gross focal neurologic deficits are appreciated.  Skin:  Skin is warm, dry and intact. No rash noted.  Psychiatric: Mood and affect are normal. Speech and behavior are normal.  ____________________________________________   LABS (all labs ordered are listed, but only abnormal results are displayed)  Labs Reviewed  BASIC METABOLIC PANEL - Abnormal; Notable for the following:    Glucose, Bld 55 (*)    BUN 33 (*)    Creatinine, Ser 1.09 (*)    GFR calc non Af Amer 48 (*)    GFR calc Af Amer 55 (*)    All other components within normal limits  GLUCOSE, CAPILLARY - Abnormal; Notable for the following:    Glucose-Capillary 31 (*)    All other components within normal limits  GLUCOSE, CAPILLARY - Abnormal; Notable for the following:    Glucose-Capillary 36 (*)    All other components within normal limits  CBC  TROPONIN I  GLUCOSE, CAPILLARY   ____________________________________________  EKG  I personally interpreted any EKGs ordered by me or triage Normal sinus rhythm rate 70 bpm no acute ST elevation or acute ST depression normal axis unremarkable EKG ____________________________________________  RADIOLOGY  I reviewed any imaging ordered by me or triage that were performed during my shift ____________________________________________   PROCEDURES  Procedure(s) performed: None  Critical Care performed: None  ____________________________________________   INITIAL IMPRESSION / ASSESSMENT AND PLAN / ED COURSE  Pertinent labs & imaging results that were available during my care of the patient were reviewed by me and considered in my medical decision making (see chart for details).  Patient  presents with very reproducible muscle strain in her trapezius muscle which is happened to her many times before. We also notice that her sugar was low. The patient apparently did not have anything to eat after her insulin today because they're going out to the fever. As the patient sits here she is now pain-free thus I touch the region. This is very reproducible pain I can also elicit it by ranging her arm she has no shoulder pain or obvious injury to her shoulder. There is no evidence of radiculopathy she has strong distal pulses compartment or soft strength and sensation are intact. I have a splint to the family that I cannot rule out cervical radiculopathy but I do have low suspicion because of the reproducibility of the injury. The patient has no evidence of shingles the patient has no evidence of ACS PE dissection myocarditis endocarditis with thorax or pneumonia. Given her age, I will send a second troponin and approximately 2 hours after this, pain started around 12:30 or 1 Ceftin and so we are 5 hours into this. She is pain-free at this time. We do note that she has very low sugar which I think is why she felt diaphoretic, and with oral replacement it is normalized and she continues to drink here. ____________________________________________   ----------------------------------------- 7:39 PM on 04/13/2015 -----------------------------------------  Second troponin hasn't dissipated negative, patient with no chest pain no shortness of breath, and the spasm in her muscle has completely resolved. She has no discomfort, she is eating and drinking, serial glucoses are normal, patient is eager to go home and "eat some ribs". They understand that without a hospital stay there is a limited to our workup but they certainly do not wish to stay which I do not think is unreasonable. Return precautions and follow-up have been given and understood. She will stay with family tonight who will monitor her  sugar  FINAL CLINICAL IMPRESSION(S) / ED DIAGNOSES  Final diagnoses:  None     Schuyler Amor,  MD 04/13/15 1822  Schuyler Amor, MD 04/13/15 [phone]

## 2015-04-13 NOTE — Discharge Instructions (Signed)
Blood Glucose Monitoring, Adult If you have sweatiness or you feel that your sugars are low be sure to drink juice. Stay with you family tonight. Follow closely with your doctor. If you have chest pain, shortness of breath, nausea, vomiting, numbness, weakness, increased pain or you feel worse in any way please return to medical attention with the emergency department Monitoring your blood glucose (also know as blood sugar) helps you to manage your diabetes. It also helps you and your health care provider monitor your diabetes and determine how well your treatment plan is working. WHY SHOULD YOU MONITOR YOUR BLOOD GLUCOSE?  It can help you understand how food, exercise, and medicine affect your blood glucose.  It allows you to know what your blood glucose is at any given moment. You can quickly tell if you are having low blood glucose (hypoglycemia) or high blood glucose (hyperglycemia).  It can help you and your health care provider know how to adjust your medicines.  It can help you understand how to manage an illness or adjust medicine for exercise. WHEN SHOULD YOU TEST? Your health care provider will help you decide how often you should check your blood glucose. This may depend on the type of diabetes you have, your diabetes control, or the types of medicines you are taking. Be sure to write down all of your blood glucose readings so that this information can be reviewed with your health care provider. See below for examples of testing times that your health care provider may suggest. Type 1 Diabetes  Test at least 2 times per day if your diabetes is well controlled, if you are using an insulin pump, or if you perform multiple daily injections.  If your diabetes is not well controlled or if you are sick, you may need to test more often.  It is a good idea to also test:  Before every insulin injection.  Before and after exercise.  Between meals and 2 hours after a meal.  Occasionally  between 2:00 a.m. and 3:00 a.m. Type 2 Diabetes  If you are taking insulin, test at least 2 times per day. However, it is best to test before every insulin injection.  If you take medicines by mouth (orally), test 2 times a day.  If you are on a controlled diet, test once a day.  If your diabetes is not well controlled or if you are sick, you may need to monitor more often. HOW TO MONITOR YOUR BLOOD GLUCOSE Supplies Needed  Blood glucose meter.  Test strips for your meter. Each meter has its own strips. You must use the strips that go with your own meter.  A pricking needle (lancet).  A device that holds the lancet (lancing device).  A journal or log book to write down your results. Procedure  Wash your hands with soap and water. Alcohol is not preferred.  Prick the side of your finger (not the tip) with the lancet.  Gently milk the finger until a small drop of blood appears.  Follow the instructions that come with your meter for inserting the test strip, applying blood to the strip, and using your blood glucose meter. Other Areas to Get Blood for Testing Some meters allow you to use other areas of your body (other than your finger) to test your blood. These areas are called alternative sites. The most common alternative sites are:  The forearm.  The thigh.  The back area of the lower leg.  The palm of the  hand. The blood flow in these areas is slower. Therefore, the blood glucose values you get may be delayed, and the numbers are different from what you would get from your fingers. Do not use alternative sites if you think you are having hypoglycemia. Your reading will not be accurate. Always use a finger if you are having hypoglycemia. Also, if you cannot feel your lows (hypoglycemia unawareness), always use your fingers for your blood glucose checks. ADDITIONAL TIPS FOR GLUCOSE MONITORING  Do not reuse lancets.  Always carry your supplies with you.  All blood  glucose meters have a 24-hour "hotline" number to call if you have questions or need help.  Adjust (calibrate) your blood glucose meter with a control solution after finishing a few boxes of strips. BLOOD GLUCOSE RECORD KEEPING It is a good idea to keep a daily record or log of your blood glucose readings. Most glucose meters, if not all, keep your glucose records stored in the meter. Some meters come with the ability to download your records to your home computer. Keeping a record of your blood glucose readings is especially helpful if you are wanting to look for patterns. Make notes to go along with the blood glucose readings because you might forget what happened at that exact time. Keeping good records helps you and your health care provider to work together to achieve good diabetes management.    This information is not intended to replace advice given to you by your health care provider. Make sure you discuss any questions you have with your health care provider.   Document Released: 05/28/2003 Document Revised: 06/15/2014 Document Reviewed: 10/17/2012 Elsevier Interactive Patient Education [employer].

## 2015-04-13 NOTE — ED Notes (Signed)
Patient and daughter with no complaints at this time. Respirations even and unlabored. Skin warm/dry. Discharge instructions reviewed with patient and daughter at this time. Patient and daughter given opportunity to voice concerns/ask questions. IV removed per policy and band-aid applied to site. Patient discharged at this time and left Emergency Department, via wheelchair.   

## 2015-04-13 NOTE — ED Notes (Signed)
XR notified patient ok to transport for x-ray

## 2015-04-13 NOTE — ED Notes (Addendum)
Pt reports left arm pain that radiates down into hand that started this afternoon, reports diaphoresis with episode. Pt unsure of shortness of breath, reports some nausea. Pt's daughter reports pt became dizzy. Daughter reports pt seems "lethargic". Pt alert and oriented in triage.

## 2015-04-16 ENCOUNTER — Encounter: Payer: Self-pay | Admitting: Emergency Medicine

## 2015-04-16 ENCOUNTER — Emergency Department
Admission: EM | Admit: 2015-04-16 | Discharge: 2015-04-16 | Disposition: A | Discharge status: Home / Self Care | Payer: Medicare Other | Attending: Emergency Medicine | Admitting: Emergency Medicine

## 2015-04-16 DIAGNOSIS — R197 Diarrhea, unspecified: Secondary | ICD-10-CM | POA: Diagnosis not present

## 2015-04-16 DIAGNOSIS — E119 Type 2 diabetes mellitus without complications: Secondary | ICD-10-CM | POA: Insufficient documentation

## 2015-04-16 DIAGNOSIS — I10 Essential (primary) hypertension: Secondary | ICD-10-CM | POA: Insufficient documentation

## 2015-04-16 DIAGNOSIS — Z7951 Long term (current) use of inhaled steroids: Secondary | ICD-10-CM | POA: Diagnosis not present

## 2015-04-16 DIAGNOSIS — Z794 Long term (current) use of insulin: Secondary | ICD-10-CM | POA: Insufficient documentation

## 2015-04-16 DIAGNOSIS — Z7982 Long term (current) use of aspirin: Secondary | ICD-10-CM | POA: Diagnosis not present

## 2015-04-16 DIAGNOSIS — Z79899 Other long term (current) drug therapy: Secondary | ICD-10-CM | POA: Diagnosis not present

## 2015-04-16 DIAGNOSIS — Z792 Long term (current) use of antibiotics: Secondary | ICD-10-CM | POA: Diagnosis not present

## 2015-04-16 LAB — COMPREHENSIVE METABOLIC PANEL
ALT: 17 U/L (ref 14–54)
AST: 20 U/L (ref 15–41)
Albumin: 4 g/dL (ref 3.5–5.0)
Alkaline Phosphatase: 103 U/L (ref 38–126)
Anion gap: 8 (ref 5–15)
BUN: 37 mg/dL — ABNORMAL HIGH (ref 6–20)
CO2: 26 mmol/L (ref 22–32)
Calcium: 9.1 mg/dL (ref 8.9–10.3)
Chloride: 104 mmol/L (ref 101–111)
Creatinine, Ser: 0.95 mg/dL (ref 0.44–1.00)
GFR calc Af Amer: >60 mL/min (ref >60)
GFR calc non Af Amer: 56 mL/min — ABNORMAL LOW (ref >60)
Glucose, Bld: 228 mg/dL — ABNORMAL HIGH (ref 65–99)
Potassium: 3.6 mmol/L (ref 3.5–5.1)
Sodium: 138 mmol/L (ref 135–145)
Total Bilirubin: 1 mg/dL (ref 0.3–1.2)
Total Protein: 6.7 g/dL (ref 6.5–8.1)

## 2015-04-16 LAB — URINALYSIS COMPLETE WITH MICROSCOPIC (ARMC ONLY)
Bacteria, UA: NONE SEEN
Bilirubin Urine: NEGATIVE
Glucose, UA: NEGATIVE mg/dL
Ketones, ur: NEGATIVE mg/dL
Nitrite: NEGATIVE
Protein, ur: NEGATIVE mg/dL
Specific Gravity, Urine: 1.017 (ref 1.005–1.030)
pH: 5 (ref 5.0–8.0)

## 2015-04-16 LAB — CBC
HCT: 42.9 % (ref 35.0–47.0)
Hemoglobin: 14 g/dL (ref 12.0–16.0)
MCH: 29.8 pg (ref 26.0–34.0)
MCHC: 32.7 g/dL (ref 32.0–36.0)
MCV: 91.1 fL (ref 80.0–100.0)
Platelets: 174 10*3/uL (ref 150–440)
RBC: 4.71 MIL/uL (ref 3.80–5.20)
RDW: 13.3 % (ref 11.5–14.5)
WBC: 5.9 10*3/uL (ref 3.6–11.0)

## 2015-04-16 LAB — C DIFFICILE QUICK SCREEN W PCR REFLEX
C Diff antigen: NEGATIVE
C Diff interpretation: NEGATIVE
C Diff toxin: NEGATIVE

## 2015-04-16 LAB — GLUCOSE, CAPILLARY: Glucose-Capillary: 192 mg/dL — ABNORMAL HIGH (ref 65–99)

## 2015-04-16 LAB — LIPASE, BLOOD: Lipase: 29 U/L (ref 11–51)

## 2015-04-16 MED ORDER — GLUCOSE BLOOD VI STRP: ORAL_STRIP | Status: DC

## 2015-04-16 MED ORDER — SODIUM CHLORIDE 0.9 % IV BOLUS (SEPSIS)
1000.0000 mL | Freq: Once | INTRAVENOUS | Status: AC
  Administered 2015-04-16: 1000 mL via INTRAVENOUS

## 2015-04-16 MED ORDER — ONDANSETRON HCL 4 MG PO TABS: 4.0000 mg | ORAL_TABLET | Freq: Every day | ORAL | Status: DC | PRN

## 2015-04-16 NOTE — ED Provider Notes (Signed)
[facility] Emergency Department Provider Note    ____________________________________________  Time seen: 1625  I have reviewed the triage vital signs and the nursing notes.   HISTORY  Chief Complaint Diarrhea and Hypoglycemia   History limited by: Not Limited   HPI Jacqueline [name] is a 79 y.o. female who presents to the emergency department today because of concerns for diarrhea. The patient states that she started having diarrhea yesterday. She describes it as watery. She denies any blood or black or tarry diarrhea. She states that she had over 10 episodes of diarrhea yesterday 1. She did have some nausea and vomited once yesterday. Patient denies any significant abdominal pain. Denies any fevers. She was seen in the hospital 3 days ago because of arm pain and hypoglycemia.   Past Medical History  Diagnosis Date  . Hypertension   . Collapsed lung   . Parkinson's disease ([place])   . Diabetes mellitus without complication ([place])     Patient takes Insulin twice a day.   . Hypothyroidism   . GERD (gastroesophageal reflux disease)   . Reactive airway disease   . Asthma   . Hyperlipidemia     There are no active problems to display for this patient.   Past Surgical History  Procedure Laterality Date  . Cholecystectomy    . Abdominal hysterectomy    . Colonoscopy with propofol N/A 03/29/2015    Procedure: COLONOSCOPY WITH PROPOFOL;  Surgeon: Hulen Luster, MD;  Location: [facility] ENDOSCOPY;  Service: Gastroenterology;  Laterality: N/A;  . Esophagogastroduodenoscopy (egd) with propofol N/A 03/29/2015    Procedure: ESOPHAGOGASTRODUODENOSCOPY (EGD) WITH PROPOFOL;  Surgeon: Hulen Luster, MD;  Location: [facility] ENDOSCOPY;  Service: Gastroenterology;  Laterality: N/A;    Current Outpatient Rx  Name  Route  Sig  Dispense  Refill  . aspirin EC 81 MG tablet   Oral   Take 81 mg by mouth daily.         . budesonide-formoterol (SYMBICORT) 80-4.5 MCG/ACT inhaler    Inhalation   Inhale 2 puffs into the lungs 2 (two) times daily.         . calcium-vitamin D (OSCAL-500) 500-400 MG-UNIT tablet   Oral   Take 1 tablet by mouth 2 (two) times daily.         . Carbidopa-Levodopa ER (SINEMET CR) 25-100 MG tablet controlled release   Oral   Take 1 tablet by mouth 2 (two) times daily.         . cephALEXin (KEFLEX) 500 MG capsule   Oral   Take 1 capsule (500 mg total) by mouth 4 (four) times daily.   40 capsule   0   . Cholecalciferol 2000 UNITS TABS   Oral   Take 2,000 Units by mouth daily.         Marland Kitchen donepezil (ARICEPT) 10 MG tablet   Oral   Take 10 mg by mouth at bedtime.         . insulin lispro protamine-lispro (HUMALOG 75/25 MIX) (75-25) 100 UNIT/ML SUSP injection   Subcutaneous   Inject 4 Units into the skin 2 (two) times daily with a meal.         . levothyroxine (SYNTHROID, LEVOTHROID) 50 MCG tablet   Oral   Take 50 mcg by mouth daily before breakfast.         . omeprazole (PRILOSEC) 40 MG capsule   Oral   Take 40 mg by mouth daily.         Marland Kitchen  rasagiline (AZILECT) 1 MG TABS tablet   Oral   Take 1 mg by mouth daily.         Marland Kitchen rOPINIRole (REQUIP) 0.5 MG tablet   Oral   Take 0.5 mg by mouth at bedtime.           Allergies Review of patient's allergies indicates no known allergies.  No family history on file.  Social History Social History  Substance Use Topics  . Smoking status: Never Smoker   . Smokeless tobacco: Never Used  . Alcohol Use: No    Review of Systems  Constitutional: Negative for fever. Cardiovascular: Negative for chest pain. Respiratory: Negative for shortness of breath. Gastrointestinal: Positive for diarrhea. Genitourinary: Negative for dysuria. Musculoskeletal: Negative for back pain. Skin: Negative for rash. Neurological: Negative for headaches, focal weakness or numbness.  10-point ROS otherwise negative.  ____________________________________________   PHYSICAL  EXAM:  VITAL SIGNS: ED Triage Vitals  Enc Vitals Group     BP 04/16/15 1417 143/75 mmHg     Pulse Rate 04/16/15 1417 70     Resp 04/16/15 1417 18     Temp 04/16/15 1417 98.1 F (36.7 C)     Temp Source 04/16/15 1417 Oral     SpO2 04/16/15 1417 98 %     Weight 04/16/15 1417 104 lb (47.174 kg)     Height 04/16/15 1417 5\' 1"  (1.549 m)     Head Cir --      Peak Flow --      Pain Score 04/16/15 1418 0   Constitutional: Alert and oriented. Well appearing and in no distress. Eyes: Conjunctivae are normal. PERRL. Normal extraocular movements. ENT   Head: Normocephalic and atraumatic.   Nose: No congestion/rhinnorhea.   Mouth/Throat: Mucous membranes are moist.   Neck: No stridor. Hematological/Lymphatic/Immunilogical: No cervical lymphadenopathy. Cardiovascular: Normal rate, regular rhythm.  No murmurs, rubs, or gallops. Respiratory: Normal respiratory effort without tachypnea nor retractions. Breath sounds are clear and equal bilaterally. No wheezes/rales/rhonchi. Gastrointestinal: Soft and nontender. No distention.  Genitourinary: Deferred Musculoskeletal: Normal range of motion in all extremities. No joint effusions.  No lower extremity tenderness nor edema. Neurologic:  Normal speech and language. No gross focal neurologic deficits are appreciated.  Skin:  Skin is warm, dry and intact. No rash noted. Psychiatric: Mood and affect are normal. Speech and behavior are normal. Patient exhibits appropriate insight and judgment.  ____________________________________________    LABS (pertinent positives/negatives)  Labs Reviewed  COMPREHENSIVE METABOLIC PANEL - Abnormal; Notable for the following:    Glucose, Bld 228 (*)    BUN 37 (*)    GFR calc non Af Amer 56 (*)    All other components within normal limits  GLUCOSE, CAPILLARY - Abnormal; Notable for the following:    Glucose-Capillary 192 (*)    All other components within normal limits  LIPASE, BLOOD  CBC   URINALYSIS COMPLETEWITH MICROSCOPIC ([facility] ONLY)  CBG MONITORING, ED     ____________________________________________   EKG  None  ____________________________________________    RADIOLOGY  None  ____________________________________________   PROCEDURES  Procedure(s) performed: None  Critical Care performed: No  ____________________________________________   INITIAL IMPRESSION / ASSESSMENT AND PLAN / ED COURSE  Pertinent labs & imaging results that were available during my care of the patient were reviewed by me and considered in my medical decision making (see chart for details).  Patient presented to the emergency department today because of concerns for diarrhea. On exam here patient appears well. Blood work  and urine without concerning findings. C. difficile was tested and was negative. Patient did feel better after some IV fluids. At this point I think likely patient suffering from viral gastroenteritis and cause of the diarrhea. Patient did not have any abdominal tenderness mostly do not think any advanced imaging is required at this time. Will discharge home with antiemetics.  ____________________________________________   FINAL CLINICAL IMPRESSION(S) / ED DIAGNOSES  Final diagnoses:  Diarrhea, unspecified type     Nance Pear, MD 04/17/15 1625

## 2015-04-16 NOTE — ED Notes (Signed)
Pt to ED with c/o diarrhea and nausea since yesterday, states she did  Vomit yesterday, seen in ED on Saturday for hypoglycemia and had a BS of 31, states she checked BS today after eating and it was 201

## 2015-04-16 NOTE — Discharge Instructions (Signed)
Please seek medical attention for any high fevers, chest pain, shortness of breath, change in behavior, persistent vomiting, bloody stool or any other new or concerning symptoms.   Diarrhea Diarrhea is frequent loose and watery bowel movements. It can cause you to feel weak and dehydrated. Dehydration can cause you to become tired and thirsty, have a dry mouth, and have decreased urination that often is dark yellow. Diarrhea is a sign of another problem, most often an infection that will not last long. In most cases, diarrhea typically lasts 2-3 days. However, it can last longer if it is a sign of something more serious. It is important to treat your diarrhea as directed by your caregiver to lessen or prevent future episodes of diarrhea. CAUSES  Some common causes include:  Gastrointestinal infections caused by viruses, bacteria, or parasites.  Food poisoning or food allergies.  Certain medicines, such as antibiotics, chemotherapy, and laxatives.  Artificial sweeteners and fructose.  Digestive disorders. HOME CARE INSTRUCTIONS  Ensure adequate fluid intake (hydration): Have 1 cup (8 oz) of fluid for each diarrhea episode. Avoid fluids that contain simple sugars or sports drinks, fruit juices, whole milk products, and sodas. Your urine should be clear or pale yellow if you are drinking enough fluids. Hydrate with an oral rehydration solution that you can purchase at pharmacies, retail stores, and online. You can prepare an oral rehydration solution at home by mixing the following ingredients together:   - tsp table salt.   tsp baking soda.   tsp salt substitute containing potassium chloride.  1  tablespoons sugar.  1 L (34 oz) of water.  Certain foods and beverages may increase the speed at which food moves through the gastrointestinal (GI) tract. These foods and beverages should be avoided and include:  Caffeinated and alcoholic beverages.  High-fiber foods, such as raw fruits and  vegetables, nuts, seeds, and whole grain breads and cereals.  Foods and beverages sweetened with sugar alcohols, such as xylitol, sorbitol, and mannitol.  Some foods may be well tolerated and may help thicken stool including:  Starchy foods, such as rice, toast, pasta, low-sugar cereal, oatmeal, grits, baked potatoes, crackers, and bagels.  Bananas.  Applesauce.  Add probiotic-rich foods to help increase healthy bacteria in the GI tract, such as yogurt and fermented milk products.  Wash your hands well after each diarrhea episode.  Only take over-the-counter or prescription medicines as directed by your caregiver.  Take a warm bath to relieve any burning or pain from frequent diarrhea episodes. SEEK IMMEDIATE MEDICAL CARE IF:   You are unable to keep fluids down.  You have persistent vomiting.  You have blood in your stool, or your stools are black and tarry.  You do not urinate in 6-8 hours, or there is only a small amount of very dark urine.  You have abdominal pain that increases or localizes.  You have weakness, dizziness, confusion, or light-headedness.  You have a severe headache.  Your diarrhea gets worse or does not get better.  You have a fever or persistent symptoms for more than 2-3 days.  You have a fever and your symptoms suddenly get worse. MAKE SURE YOU:   Understand these instructions.  Will watch your condition.  Will get help right away if you are not doing well or get worse.   This information is not intended to replace advice given to you by your health care provider. Make sure you discuss any questions you have with your health care provider.  Document Released: 05/15/2002 Document Revised: 06/15/2014 Document Reviewed: 01/31/2012 Elsevier Interactive Patient Education [employer].

## 2015-06-12 ENCOUNTER — Ambulatory Visit: Payer: PPO | Attending: Neurology | Admitting: Speech Pathology

## 2015-06-12 ENCOUNTER — Encounter: Payer: Self-pay | Admitting: Speech Pathology

## 2015-06-12 DIAGNOSIS — R490 Dysphonia: Secondary | ICD-10-CM | POA: Diagnosis present

## 2015-06-12 NOTE — Therapy (Signed)
[place] [facility] MAIN [facility] SERVICES [address] [place], [place], [address] Phone: [phone]   Fax:  [phone]  Speech Language Pathology Evaluation  Patient Details  Name: Jacqueline [name] MRN: [id number] Date of Birth: 02/07/1936 Referring Provider: Dr. Malvin Johns  Encounter Date: 06/12/2015      End of Session - 06/12/15 1530    Visit Number 1   Number of Visits 17   Date for SLP Re-Evaluation 07/19/15   SLP Start Time 1420   SLP Stop Time  1510   SLP Time Calculation (min) 50 min   Activity Tolerance Patient tolerated treatment well      Past Medical History  Diagnosis Date  . Hypertension   . Collapsed lung   . Parkinson's disease ([facility])   . Diabetes mellitus without complication ([facility])     Patient takes Insulin twice a day.   . Hypothyroidism   . GERD (gastroesophageal reflux disease)   . Reactive airway disease   . Asthma   . Hyperlipidemia     Past Surgical History  Procedure Laterality Date  . Cholecystectomy    . Abdominal hysterectomy    . Colonoscopy with propofol N/A 03/29/2015    Procedure: COLONOSCOPY WITH PROPOFOL;  Surgeon: Wallace Cullens, MD;  Location: [facility] ENDOSCOPY;  Service: Gastroenterology;  Laterality: N/A;  . Esophagogastroduodenoscopy (egd) with propofol N/A 03/29/2015    Procedure: ESOPHAGOGASTRODUODENOSCOPY (EGD) WITH PROPOFOL;  Surgeon: Wallace Cullens, MD;  Location: [facility] ENDOSCOPY;  Service: Gastroenterology;  Laterality: N/A;    There were no vitals filed for this visit.  Visit Diagnosis: Dysphonia - Plan: SLP plan of care cert/re-cert      Subjective Assessment - 06/12/15 1529    Subjective The patient reports that speech/voice changes due to Parkinson's; changes include hypophonia, hoarse vocal quality, and monotone speech.  There are no reported swallowing problems.  Patient exhibits tense, strained phonation.   Currently in Pain? No/denies            SLP Evaluation [facility] - 06/12/15 0001    SLP  Visit Information   SLP Received On 06/12/15   Referring Provider Dr. Malvin Johns   Onset Date 02/06/2015   Medical Diagnosis Parkinson's disease   Subjective   Subjective The patient reports that speech/voice changes due to Parkinson's; changes include hypophonia, hoarse vocal quality, and monotone speech.  There are no reported swallowing problems.  Patient exhibits tense, strained phonation.   Patient/Family Stated Goal Loud and clear speech   Prior Functional Status   Cognitive/Linguistic Baseline Baseline deficits   Baseline deficit details Dysphonia due to Parkinson's disease   Oral Motor/Sensory Function   Overall Oral Motor/Sensory Function Appears within functional limits for tasks assessed   Motor Speech   Overall Motor Speech Impaired   Respiration Impaired   Level of Impairment Conversation   Phonation Hoarse;Low vocal intensity   Resonance Within functional limits   Articulation Within functional limitis   Intelligibility Intelligibility reduced   Conversation Other (comment)  Hypophonia interfers with intelligibility   Motor Planning Witnin functional limits   Motor Speech Errors Not applicable   Phonation Impaired   Vocal Abuses Habitual Hyperphonia;Glottal Attack;Vocal Fold Dehydration   Tension Present Jaw;Neck;Shoulder   Volume Soft   Pitch Low   Standardized Assessments   Standardized Assessments  Other Assessment  LSVT-LOUD Voice Evaluation      LSVT-LOUD Voice Evaluation  Voice history: The patient reports that speech/voice changes due to Parkinson's; changes include hypophonia, hoarse vocal quality,  and monotone speech.  There are no reported swallowing problems.  Patient exhibits tense, strained phonation.  Maximum phonation time for sustained "ah": 8 seconds  Mean intensity during sustained "ah": 70 dB   Mean intensity sustained during conversational speech: 68 dB  Average fundamental frequency during sustained "ah": 196 Hz (1.8 STD below average for age  and gender)  Highest dynamic pitch when altering pitch from a low note to a high note: 418 Hz  Highest pitch during conversational speech: 559 Hz  Lowest dynamic pitch when altering from a high note to a low note: 82 Hz  Lowest pitch during conversational speech: 87 Hz  Visi-Pitch: Multi-Dimensional Voice Program (MDVP)  MDVPT extracts objective quantitative values (Relative Average Perturbation, Shimmer, Voice Turbulence Index, and Noise to Harmonic Ratio) on sustained phonation, which are displayed graphically and numerically in comparison to a built-in normative database.  The patient exhibited values outside the norm for Relative Average Perturbation.  The patient improved all parameters, as well as perceived vocal quality, when cued to alter voicing (shaped from loud/forward resonance "call")   Stimulability:  Patient demonstrates improved vocal quality with loud voice (shaped from loud/forward resonance "call" and modeled loud and clear vowel in words).         SLP Education - 06/12/15 1529    Education provided Yes   Education Details LSVT-LOUD procedure and benefits   Person(s) Educated Patient   Methods Explanation   Comprehension Verbalized understanding            SLP Long Term Goals - 06/12/15 1532    SLP LONG TERM GOAL #1   Title The patient will complete Daily Tasks (Maximum duration "ah", High/Lows, and Functional Phrases) at average loudness of 80 dB and with loud, good quality voice.    Time 4   Period Weeks   Status New   SLP LONG TERM GOAL #2   Title The patient will complete Hierarchal Speech Loudness reading drills (words/phrases, sentences, and paragraph) at average 75 dB and with loud, good quality voice.     Time 4   Period Weeks   Status New   SLP LONG TERM GOAL #3   Title The patient will complete homework daily.   Time 4   Period Weeks   Status New   SLP LONG TERM GOAL #4   Title The patient will participate in conversation, maintaining  average loudness of 75 dB and loud, good quality voice.   Time 4   Period Weeks   Status New          Plan - 06/12/15 1531    Clinical Impression Statement This 80 year old woman with diagnosed Parkinson' disease is presenting with moderate voice disorder characterized by hoarse vocal quality, hypophonia, and monotone voice. Based on stimulability testing, the patient is judged to be a good candidate for the LSVT LOUD program.  It is recommended that the patient receive the LSVT LOUD program which is comprised of 16 intensive sessions (4 times per week for 4 weeks, one hour sessions).  Prognosis for improvement is good based on his motivation, stimulability, and strong family support.  LSVT LOUD has been documented in the literature as efficacious for individuals with Parkinson's disease.     Speech Therapy Frequency 4x / week   Duration 4 weeks   Treatment/Interventions Other (comment)  LSVT-LOUD   Potential to Achieve Goals Good   Potential Considerations Ability to learn/carryover information;Cooperation/participation level;Previous level of function;Severity of impairments;Family/community support   SLP  Home Exercise Plan LSVT-LOUD daily homework          G-Codes - 18-Jun-2015 1533    Functional Assessment Tool Used LSVT-LOUD Voice Evaluation, clinical judgment   Functional Limitations Voice   Voice Current Status (G9171) At least 40 percent but less than 60 percent impaired, limited or restricted   Voice Goal Status (Z6109) At least 1 percent but less than 20 percent impaired, limited or restricted      Problem List There are no active problems to display for this patient.  Jacqueline Primrose, MS/CCC- SLP  Jacqueline [name] 2015/06/18, 3:38 PM  [place] [facility] MAIN [facility] SERVICES [address] [place], [place], [address] Phone: [phone]   Fax:  [phone]  Name: Jacqueline [name] MRN: [id number] Date of Birth: 04-21-1936

## 2015-06-17 ENCOUNTER — Ambulatory Visit: Payer: PPO | Admitting: Speech Pathology

## 2015-06-18 ENCOUNTER — Ambulatory Visit: Payer: PPO | Admitting: Speech Pathology

## 2015-06-19 ENCOUNTER — Ambulatory Visit: Payer: PPO | Admitting: Speech Pathology

## 2015-06-19 DIAGNOSIS — R490 Dysphonia: Secondary | ICD-10-CM

## 2015-06-20 ENCOUNTER — Ambulatory Visit: Payer: PPO | Admitting: Speech Pathology

## 2015-06-20 ENCOUNTER — Encounter: Payer: Self-pay | Admitting: Speech Pathology

## 2015-06-20 DIAGNOSIS — R490 Dysphonia: Secondary | ICD-10-CM | POA: Diagnosis not present

## 2015-06-20 NOTE — Therapy (Signed)
[place] MAIN [facility] SERVICES [address] [place], [place], [address] Phone: [phone]   Fax:  [phone]  Speech Language Pathology Treatment  Patient Details  Name: Jacqueline [name] MRN: [id number] Date of Birth: [birth year] Referring Provider: Dr. Melrose Nakayama  Encounter Date: 06/19/2015      End of Session - 06/20/15 1306    Visit Number 2   Number of Visits 17   Date for SLP Re-Evaluation 07/19/15   SLP Start Time 1415   SLP Stop Time  1502   SLP Time Calculation (min) 47 min   Activity Tolerance Patient tolerated treatment well      Past Medical History  Diagnosis Date  . Hypertension   . Collapsed lung   . Parkinson's disease ([place])   . Diabetes mellitus without complication ([place])     Patient takes Insulin twice a day.   . Hypothyroidism   . GERD (gastroesophageal reflux disease)   . Reactive airway disease   . Asthma   . Hyperlipidemia     Past Surgical History  Procedure Laterality Date  . Cholecystectomy    . Abdominal hysterectomy    . Colonoscopy with propofol N/A 03/29/2015    Procedure: COLONOSCOPY WITH PROPOFOL;  Surgeon: Hulen Luster, MD;  Location: [facility] ENDOSCOPY;  Service: Gastroenterology;  Laterality: N/A;  . Esophagogastroduodenoscopy (egd) with propofol N/A 03/29/2015    Procedure: ESOPHAGOGASTRODUODENOSCOPY (EGD) WITH PROPOFOL;  Surgeon: Hulen Luster, MD;  Location: [facility] ENDOSCOPY;  Service: Gastroenterology;  Laterality: N/A;    There were no vitals filed for this visit.  Visit Diagnosis: Dysphonia      Subjective Assessment - 06/20/15 1305    Subjective The patient is eager to improve her voice   Currently in Pain? No/denies               ADULT SLP TREATMENT - 06/20/15 0001    General Information   Behavior/Cognition Alert;Cooperative;Pleasant mood   HPI Parkinson's disease   Treatment Provided   Treatment provided Cognitive-Linquistic   Pain Assessment   Pain Assessment No/denies pain   Cognitive-Linquistic Treatment   Treatment focused on Voice   Skilled Treatment Daily Task #1: Average 5 seconds, 78 dB. Daily Task 2: Highs: 15 high pitched "ah" given mod-max cues. Lows: 15 low pitched "ah" given mod-max cues. Daily task #3: Average 70 dB.  Hierarchal speech loudness drill: Read words, 73 dB.  Read sentences, 70 dB.  Homework: assignments given.  Off the cuff remarks: average 65 dB.   Assessment / Recommendations / Plan   Plan Continue with current plan of care   Progression Toward Goals   Progression toward goals Progressing toward goals          SLP Education - 06/20/15 1306    Education provided Yes   Education Details LSVT-LOUD   Person(s) Educated Patient   Methods Explanation;Demonstration;Verbal cues;Handout   Comprehension Verbalized understanding;Returned demonstration;Verbal cues required;Need further instruction            SLP Long Term Goals - 06/12/15 1532    SLP LONG TERM GOAL #1   Title The patient will complete Daily Tasks (Maximum duration "ah", High/Lows, and Functional Phrases) at average loudness of 80 dB and with loud, good quality voice.    Time 4   Period Weeks   Status New   SLP LONG TERM GOAL #2   Title The patient will complete Hierarchal Speech Loudness reading drills (words/phrases, sentences, and paragraph) at average 75 dB and  with loud, good quality voice.     Time 4   Period Weeks   Status New   SLP LONG TERM GOAL #3   Title The patient will complete homework daily.   Time 4   Period Weeks   Status New   SLP LONG TERM GOAL #4   Title The patient will participate in conversation, maintaining average loudness of 75 dB and loud, good quality voice.   Time 4   Period Weeks   Status New          Plan - 06/20/15 1306    Clinical Impression Statement The patient is completing daily tasks and hierarchal speech drill tasks with louder, better quality voice given SLP cues.     Speech Therapy Frequency 4x / week   Duration  4 weeks   Treatment/Interventions Other (comment)  LSVT-LOUD   Potential to Achieve Goals Good   Potential Considerations Ability to learn/carryover information;Cooperation/participation level;Previous level of function;Severity of impairments;Family/community support   SLP Home Exercise Plan LSVT-LOUD daily homework   Consulted and Agree with Plan of Care Patient        Problem List There are no active problems to display for this patient.  Leroy Sea, MS/CCC- SLP  Lou Miner 06/20/2015, 1:08 PM  [place] MAIN [facility] SERVICES [address] [place], [place], [address] Phone: [phone]   Fax:  [phone]   Name: Jacqueline [name] MRN: [id number] Date of Birth: 1936/05/14

## 2015-06-20 NOTE — Therapy (Signed)
[place] MAIN [facility] SERVICES [address] [place], [place], [address] Phone: [phone]   Fax:  [phone]  Speech Language Pathology Treatment  Patient Details  Name: Jacqueline [name] MRN: [id number] Date of Birth: 09/26/1935 Referring Provider: Dr. Melrose Nakayama  Encounter Date: 06/20/2015      End of Session - 06/20/15 1657    Visit Number 3   Number of Visits 17   Date for SLP Re-Evaluation 07/19/15   SLP Start Time 1415   SLP Stop Time  1500   SLP Time Calculation (min) 45 min   Activity Tolerance Patient tolerated treatment well      Past Medical History  Diagnosis Date  . Hypertension   . Collapsed lung   . Parkinson's disease ([place])   . Diabetes mellitus without complication ([place])     Patient takes Insulin twice a day.   . Hypothyroidism   . GERD (gastroesophageal reflux disease)   . Reactive airway disease   . Asthma   . Hyperlipidemia     Past Surgical History  Procedure Laterality Date  . Cholecystectomy    . Abdominal hysterectomy    . Colonoscopy with propofol N/A 03/29/2015    Procedure: COLONOSCOPY WITH PROPOFOL;  Surgeon: Hulen Luster, MD;  Location: [facility] ENDOSCOPY;  Service: Gastroenterology;  Laterality: N/A;  . Esophagogastroduodenoscopy (egd) with propofol N/A 03/29/2015    Procedure: ESOPHAGOGASTRODUODENOSCOPY (EGD) WITH PROPOFOL;  Surgeon: Hulen Luster, MD;  Location: [facility] ENDOSCOPY;  Service: Gastroenterology;  Laterality: N/A;    There were no vitals filed for this visit.  Visit Diagnosis: Dysphonia      Subjective Assessment - 06/20/15 1656    Subjective The patient is eager to improve her voice   Currently in Pain? No/denies               ADULT SLP TREATMENT - 06/20/15 1655    General Information   Behavior/Cognition Alert;Cooperative;Pleasant mood   HPI Parkinson's disease   Treatment Provided   Treatment provided Cognitive-Linquistic   Pain Assessment   Pain Assessment No/denies pain   Cognitive-Linquistic Treatment   Treatment focused on Voice   Skilled Treatment Daily Task #1: Average 6 seconds, 78 dB. Daily Task 2: Highs: 15 high pitched "ah" given mod-max cues. Lows: 15 low pitched "ah" given mod-max cues. Daily task #3: Average 71 dB.  Hierarchal speech loudness drill: Read sentences, 72 dB.  Generate short responses, 70 dB.  Homework: assignments completed.  Off the cuff remarks: average 69 dB.   Assessment / Recommendations / Plan   Plan Continue with current plan of care   Progression Toward Goals   Progression toward goals Progressing toward goals          SLP Education - 06/20/15 1656    Education provided Yes   Education Details LSVT-LOUD   Person(s) Educated Patient   Methods Explanation;Demonstration;Verbal cues;Handout   Comprehension Verbalized understanding;Returned demonstration;Verbal cues required;Need further instruction            SLP Long Term Goals - 06/12/15 1532    SLP LONG TERM GOAL #1   Title The patient will complete Daily Tasks (Maximum duration "ah", High/Lows, and Functional Phrases) at average loudness of 80 dB and with loud, good quality voice.    Time 4   Period Weeks   Status New   SLP LONG TERM GOAL #2   Title The patient will complete Hierarchal Speech Loudness reading drills (words/phrases, sentences, and paragraph) at average 75 dB  and with loud, good quality voice.     Time 4   Period Weeks   Status New   SLP LONG TERM GOAL #3   Title The patient will complete homework daily.   Time 4   Period Weeks   Status New   SLP LONG TERM GOAL #4   Title The patient will participate in conversation, maintaining average loudness of 75 dB and loud, good quality voice.   Time 4   Period Weeks   Status New          Plan - 06/20/15 1657    Clinical Impression Statement The patient is completing daily tasks and hierarchal speech drill tasks with louder, better quality voice given SLP cues.     Speech Therapy Frequency 4x /  week   Duration 4 weeks   Treatment/Interventions Other (comment)  LSVT-LOUD   Potential to Achieve Goals Good   Potential Considerations Ability to learn/carryover information;Cooperation/participation level;Previous level of function;Severity of impairments;Family/community support   SLP Home Exercise Plan LSVT-LOUD daily homework   Consulted and Agree with Plan of Care Patient        Problem List There are no active problems to display for this patient.  Leroy Sea, MS/CCC- SLP  Lou Miner 06/20/2015, 4:58 PM  [place] MAIN [facility] SERVICES [address] [place], [place], [address] Phone: [phone]   Fax:  [phone]   Name: KEIVA TRUSZKOWSKI MRN: [id number] Date of Birth: 1936/02/01

## 2015-06-24 ENCOUNTER — Encounter: Payer: Self-pay | Admitting: Speech Pathology

## 2015-06-24 ENCOUNTER — Ambulatory Visit: Payer: PPO | Admitting: Speech Pathology

## 2015-06-24 DIAGNOSIS — R490 Dysphonia: Secondary | ICD-10-CM

## 2015-06-24 NOTE — Therapy (Signed)
[place] MAIN [facility] SERVICES [address] [place], [place], [address] Phone: [phone]   Fax:  [phone]  Speech Language Pathology Treatment  Patient Details  Name: Jacqueline [name] MRN: [id number] Date of Birth: 1935/06/22 Referring Provider: Dr. Melrose Nakayama  Encounter Date: 06/24/2015      End of Session - 06/24/15 1454    Visit Number 4   Number of Visits 17   Date for SLP Re-Evaluation 07/19/15   SLP Start Time 1347   SLP Stop Time  1445   SLP Time Calculation (min) 58 min   Activity Tolerance Patient tolerated treatment well      Past Medical History  Diagnosis Date  . Hypertension   . Collapsed lung   . Parkinson's disease ([place])   . Diabetes mellitus without complication ([place])     Patient takes Insulin twice a day.   . Hypothyroidism   . GERD (gastroesophageal reflux disease)   . Reactive airway disease   . Asthma   . Hyperlipidemia     Past Surgical History  Procedure Laterality Date  . Cholecystectomy    . Abdominal hysterectomy    . Colonoscopy with propofol N/A 03/29/2015    Procedure: COLONOSCOPY WITH PROPOFOL;  Surgeon: Hulen Luster, MD;  Location: [facility] ENDOSCOPY;  Service: Gastroenterology;  Laterality: N/A;  . Esophagogastroduodenoscopy (egd) with propofol N/A 03/29/2015    Procedure: ESOPHAGOGASTRODUODENOSCOPY (EGD) WITH PROPOFOL;  Surgeon: Hulen Luster, MD;  Location: [facility] ENDOSCOPY;  Service: Gastroenterology;  Laterality: N/A;    There were no vitals filed for this visit.  Visit Diagnosis: Dysphonia      Subjective Assessment - 06/24/15 1453    Subjective The patient reports that she is trying to use her loud voice more   Currently in Pain? No/denies               ADULT SLP TREATMENT - 06/24/15 0001    General Information   Behavior/Cognition Alert;Cooperative;Pleasant mood   HPI Parkinson's disease   Treatment Provided   Treatment provided Cognitive-Linquistic   Pain Assessment   Pain  Assessment No/denies pain   Cognitive-Linquistic Treatment   Treatment focused on Voice   Skilled Treatment Daily Task #1: Average 9 seconds, 78 dB. Daily Task 2: Highs: 15 high pitched "ah" given mod-max cues. Lows: 15 low pitched "ah" given mod-max cues. Daily task #3: Average 71 dB.  Hierarchal speech loudness drill: Read sentences, 72 dB.  Generate short responses, 70 dB.  Homework: assignments completed.  Off the cuff remarks: average 69 dB (up to 71 dB).   Assessment / Recommendations / Plan   Plan Continue with current plan of care   Progression Toward Goals   Progression toward goals Progressing toward goals          SLP Education - 06/24/15 1454    Education provided Yes   Education Details LSVT-LOUD   Person(s) Educated Patient   Methods Explanation;Demonstration;Verbal cues;Handout   Comprehension Verbalized understanding;Returned demonstration;Need further instruction;Verbal cues required            SLP Long Term Goals - 06/12/15 1532    SLP LONG TERM GOAL #1   Title The patient will complete Daily Tasks (Maximum duration "ah", High/Lows, and Functional Phrases) at average loudness of 80 dB and with loud, good quality voice.    Time 4   Period Weeks   Status New   SLP LONG TERM GOAL #2   Title The patient will complete Hierarchal Speech Loudness  reading drills (words/phrases, sentences, and paragraph) at average 75 dB and with loud, good quality voice.     Time 4   Period Weeks   Status New   SLP LONG TERM GOAL #3   Title The patient will complete homework daily.   Time 4   Period Weeks   Status New   SLP LONG TERM GOAL #4   Title The patient will participate in conversation, maintaining average loudness of 75 dB and loud, good quality voice.   Time 4   Period Weeks   Status New          Plan - 06/24/15 1455    Clinical Impression Statement The patient is completing daily tasks and hierarchal speech drill tasks with louder, better quality voice given  fewer SLP cues.  She is demonstrating emerging generalization into conversational speech.   Speech Therapy Frequency 4x / week   Duration 4 weeks   Treatment/Interventions Other (comment)  LSVT-LOUD   Potential to Achieve Goals Good   Potential Considerations Ability to learn/carryover information;Cooperation/participation level;Previous level of function;Severity of impairments;Family/community support   SLP Home Exercise Plan LSVT-LOUD daily homework   Consulted and Agree with Plan of Care Patient        Problem List There are no active problems to display for this patient.  Leroy Sea, MS/CCC- SLP  Lou Miner 06/24/2015, 2:56 PM  [place] MAIN [facility] SERVICES [address] [place], [place], [address] Phone: [phone]   Fax:  [phone]   Name: Jacqueline [name] MRN: [id number] Date of Birth: 10-02-1935

## 2015-06-25 ENCOUNTER — Ambulatory Visit: Payer: PPO | Admitting: Speech Pathology

## 2015-06-25 ENCOUNTER — Encounter: Payer: Self-pay | Admitting: Speech Pathology

## 2015-06-25 DIAGNOSIS — R490 Dysphonia: Secondary | ICD-10-CM | POA: Diagnosis not present

## 2015-06-25 NOTE — Therapy (Signed)
[place] MAIN [facility] SERVICES [address] [place], [place], [address] Phone: [phone]   Fax:  [phone]  Speech Language Pathology Treatment  Patient Details  Name: Jacqueline [name] MRN: [id number] Date of Birth: 1935-06-09 Referring Provider: Dr. Melrose Nakayama  Encounter Date: 06/25/2015      End of Session - 06/25/15 1614    Visit Number 5   Number of Visits 17   Date for SLP Re-Evaluation 07/19/15   SLP Start Time 1400   SLP Stop Time  1454   SLP Time Calculation (min) 54 min   Activity Tolerance Patient tolerated treatment well      Past Medical History  Diagnosis Date  . Hypertension   . Collapsed lung   . Parkinson's disease ([place])   . Diabetes mellitus without complication ([place])     Patient takes Insulin twice a day.   . Hypothyroidism   . GERD (gastroesophageal reflux disease)   . Reactive airway disease   . Asthma   . Hyperlipidemia     Past Surgical History  Procedure Laterality Date  . Cholecystectomy    . Abdominal hysterectomy    . Colonoscopy with propofol N/A 03/29/2015    Procedure: COLONOSCOPY WITH PROPOFOL;  Surgeon: Hulen Luster, MD;  Location: [facility] ENDOSCOPY;  Service: Gastroenterology;  Laterality: N/A;  . Esophagogastroduodenoscopy (egd) with propofol N/A 03/29/2015    Procedure: ESOPHAGOGASTRODUODENOSCOPY (EGD) WITH PROPOFOL;  Surgeon: Hulen Luster, MD;  Location: [facility] ENDOSCOPY;  Service: Gastroenterology;  Laterality: N/A;    There were no vitals filed for this visit.  Visit Diagnosis: Dysphonia      Subjective Assessment - 06/25/15 1613    Subjective The patient reports that she is trying to use her loud voice more   Currently in Pain? No/denies               ADULT SLP TREATMENT - 06/25/15 0001    General Information   Behavior/Cognition Alert;Cooperative;Pleasant mood   HPI Parkinson's disease   Treatment Provided   Treatment provided Cognitive-Linquistic   Pain Assessment   Pain  Assessment No/denies pain   Cognitive-Linquistic Treatment   Treatment focused on Voice   Skilled Treatment Daily Task #1: Average 6 seconds, 79 dB. Daily Task 2: Highs: 15 high pitched "ah" given mod cues. Lows: 15 low pitched "ah" given mod cues. Daily task #3: Average 71 dB.  Hierarchal speech loudness drill: Read sentences, 72 dB.  Generate short responses, 70 dB.  Homework: assignments completed.  Off the cuff remarks: average 69 dB (up to 71 dB).   Assessment / Recommendations / Plan   Plan Continue with current plan of care   Progression Toward Goals   Progression toward goals Progressing toward goals          SLP Education - 06/25/15 1614    Education provided Yes   Education Details LSVT-LOUD   Person(s) Educated Patient   Methods Explanation;Demonstration;Verbal cues;Handout   Comprehension Verbalized understanding;Returned demonstration;Verbal cues required;Need further instruction            SLP Long Term Goals - 06/12/15 1532    SLP LONG TERM GOAL #1   Title The patient will complete Daily Tasks (Maximum duration "ah", High/Lows, and Functional Phrases) at average loudness of 80 dB and with loud, good quality voice.    Time 4   Period Weeks   Status New   SLP LONG TERM GOAL #2   Title The patient will complete Hierarchal Speech Loudness  reading drills (words/phrases, sentences, and paragraph) at average 75 dB and with loud, good quality voice.     Time 4   Period Weeks   Status New   SLP LONG TERM GOAL #3   Title The patient will complete homework daily.   Time 4   Period Weeks   Status New   SLP LONG TERM GOAL #4   Title The patient will participate in conversation, maintaining average loudness of 75 dB and loud, good quality voice.   Time 4   Period Weeks   Status New          Plan - 06/25/15 1614    Clinical Impression Statement The patient is completing daily tasks and hierarchal speech drill tasks with louder, better quality voice given fewer SLP  cues.  She is demonstrating emerging generalization into conversational speech.   Speech Therapy Frequency 4x / week   Duration 4 weeks   Treatment/Interventions Other (comment)  LSVT-LOUD   Potential to Achieve Goals Good   Potential Considerations Ability to learn/carryover information;Cooperation/participation level;Previous level of function;Severity of impairments;Family/community support   SLP Home Exercise Plan LSVT-LOUD daily homework   Consulted and Agree with Plan of Care Patient        Problem List There are no active problems to display for this patient.  Leroy Sea, MS/CCC- SLP  Lou Miner 06/25/2015, 4:15 PM  [place] MAIN [facility] SERVICES [address] [place], [place], [address] Phone: [phone]   Fax:  [phone]   Name: Jacqueline [name] MRN: [id number] Date of Birth: 04-20-1936

## 2015-06-26 ENCOUNTER — Encounter: Payer: Self-pay | Admitting: Speech Pathology

## 2015-06-26 ENCOUNTER — Ambulatory Visit: Payer: PPO | Admitting: Speech Pathology

## 2015-06-26 DIAGNOSIS — R490 Dysphonia: Secondary | ICD-10-CM

## 2015-06-26 NOTE — Therapy (Signed)
[place] MAIN [facility] SERVICES [address] [place], [place], [address] Phone: [phone]   Fax:  [phone]  Speech Language Pathology Treatment  Patient Details  Name: Jacqueline [name] MRN: [id number] Date of Birth: 07/24/1935 Referring Provider: Dr. Melrose Nakayama  Encounter Date: 06/26/2015      End of Session - 06/26/15 1458    Visit Number 6   Number of Visits 17   Date for SLP Re-Evaluation 07/19/15   SLP Start Time 1330   SLP Stop Time  1427   SLP Time Calculation (min) 57 min   Activity Tolerance Patient tolerated treatment well      Past Medical History  Diagnosis Date  . Hypertension   . Collapsed lung   . Parkinson's disease ([place])   . Diabetes mellitus without complication ([place])     Patient takes Insulin twice a day.   . Hypothyroidism   . GERD (gastroesophageal reflux disease)   . Reactive airway disease   . Asthma   . Hyperlipidemia     Past Surgical History  Procedure Laterality Date  . Cholecystectomy    . Abdominal hysterectomy    . Colonoscopy with propofol N/A 03/29/2015    Procedure: COLONOSCOPY WITH PROPOFOL;  Surgeon: Hulen Luster, MD;  Location: [facility] ENDOSCOPY;  Service: Gastroenterology;  Laterality: N/A;  . Esophagogastroduodenoscopy (egd) with propofol N/A 03/29/2015    Procedure: ESOPHAGOGASTRODUODENOSCOPY (EGD) WITH PROPOFOL;  Surgeon: Hulen Luster, MD;  Location: [facility] ENDOSCOPY;  Service: Gastroenterology;  Laterality: N/A;    There were no vitals filed for this visit.  Visit Diagnosis: Dysphonia      Subjective Assessment - 06/26/15 1458    Subjective The patient reports that she is trying to use her loud voice more   Currently in Pain? No/denies               ADULT SLP TREATMENT - 06/26/15 0001    General Information   Behavior/Cognition Alert;Cooperative;Pleasant mood   HPI Parkinson's disease   Treatment Provided   Treatment provided Cognitive-Linquistic   Pain Assessment   Pain  Assessment No/denies pain   Cognitive-Linquistic Treatment   Treatment focused on Voice   Skilled Treatment Daily Task #1: Average 7 seconds, 81 dB. Daily Task 2: Highs: 15 high pitched "ah" given mod cues. Lows: 15 low pitched "ah" given mod cues. Daily task #3: Average 72 dB.  Hierarchal speech loudness drill: Read sentences, 72 dB.  Generate short responses, 71 dB.  Homework: assignments completed.  Off the cuff remarks: average 69 dB (up to 71 dB).   Assessment / Recommendations / Plan   Plan Continue with current plan of care   Progression Toward Goals   Progression toward goals Progressing toward goals          SLP Education - 06/26/15 1458    Education provided Yes   Education Details LSVT-LOUD   Person(s) Educated Patient   Methods Explanation;Demonstration;Verbal cues;Handout   Comprehension Verbalized understanding;Returned demonstration;Verbal cues required;Need further instruction            SLP Long Term Goals - 06/12/15 1532    SLP LONG TERM GOAL #1   Title The patient will complete Daily Tasks (Maximum duration "ah", High/Lows, and Functional Phrases) at average loudness of 80 dB and with loud, good quality voice.    Time 4   Period Weeks   Status New   SLP LONG TERM GOAL #2   Title The patient will complete Hierarchal Speech Loudness  reading drills (words/phrases, sentences, and paragraph) at average 75 dB and with loud, good quality voice.     Time 4   Period Weeks   Status New   SLP LONG TERM GOAL #3   Title The patient will complete homework daily.   Time 4   Period Weeks   Status New   SLP LONG TERM GOAL #4   Title The patient will participate in conversation, maintaining average loudness of 75 dB and loud, good quality voice.   Time 4   Period Weeks   Status New          Plan - 06/26/15 1459    Clinical Impression Statement The patient is completing daily tasks and hierarchal speech drill tasks with louder, better quality voice given fewer SLP  cues.  She is demonstrating emerging generalization into conversational speech.   Speech Therapy Frequency 4x / week   Duration 4 weeks   Treatment/Interventions Other (comment)  LSVT-LOUD   Potential to Achieve Goals Good   Potential Considerations Ability to learn/carryover information;Cooperation/participation level;Previous level of function;Severity of impairments;Family/community support   SLP Home Exercise Plan LSVT-LOUD daily homework   Consulted and Agree with Plan of Care Patient        Problem List There are no active problems to display for this patient.  Leroy Sea, [place], [place] 06/26/2015, 3:00 PM  [place] MAIN [facility] SERVICES [address] [place], [place], [address] Phone: [phone]   Fax:  [phone]   Name: QUINISHA CERMINARA MRN: [id number] Date of Birth: 1936/01/04

## 2015-06-27 ENCOUNTER — Ambulatory Visit: Payer: PPO | Admitting: Speech Pathology

## 2015-06-27 ENCOUNTER — Encounter: Payer: Self-pay | Admitting: Speech Pathology

## 2015-06-27 DIAGNOSIS — R490 Dysphonia: Secondary | ICD-10-CM | POA: Diagnosis not present

## 2015-06-27 NOTE — Therapy (Signed)
[place] MAIN [facility] SERVICES [address] [place], [place], [address] Phone: [phone]   Fax:  [phone]  Speech Language Pathology Treatment  Patient Details  Name: Jacqueline [name] MRN: [id number] Date of Birth: 03-20-1936 Referring Provider: Dr. Melrose Nakayama  Encounter Date: 06/27/2015      End of Session - 06/27/15 1635    Visit Number 7   Number of Visits 17   Date for SLP Re-Evaluation 07/19/15   SLP Start Time 1400   SLP Stop Time  1455   SLP Time Calculation (min) 55 min   Activity Tolerance Patient tolerated treatment well      Past Medical History  Diagnosis Date  . Hypertension   . Collapsed lung   . Parkinson's disease ([employer])   . Diabetes mellitus without complication ([place])     Patient takes Insulin twice a day.   . Hypothyroidism   . GERD (gastroesophageal reflux disease)   . Reactive airway disease   . Asthma   . Hyperlipidemia     Past Surgical History  Procedure Laterality Date  . Cholecystectomy    . Abdominal hysterectomy    . Colonoscopy with propofol N/A 03/29/2015    Procedure: COLONOSCOPY WITH PROPOFOL;  Surgeon: Hulen Luster, MD;  Location: [facility] ENDOSCOPY;  Service: Gastroenterology;  Laterality: N/A;  . Esophagogastroduodenoscopy (egd) with propofol N/A 03/29/2015    Procedure: ESOPHAGOGASTRODUODENOSCOPY (EGD) WITH PROPOFOL;  Surgeon: Hulen Luster, MD;  Location: [facility] ENDOSCOPY;  Service: Gastroenterology;  Laterality: N/A;    There were no vitals filed for this visit.  Visit Diagnosis: Dysphonia      Subjective Assessment - 06/27/15 1634    Subjective The patient reports that her brother could hear her better   Currently in Pain? No/denies               ADULT SLP TREATMENT - 06/27/15 0001    General Information   Behavior/Cognition Alert;Cooperative;Pleasant mood   HPI Parkinson's disease   Treatment Provided   Treatment provided Cognitive-Linquistic   Pain Assessment   Pain Assessment  No/denies pain   Cognitive-Linquistic Treatment   Treatment focused on Voice   Skilled Treatment Daily Task #1: Average 7 seconds, 83 dB. Daily Task 2: Highs: 15 high pitched "ah" given mod cues. Lows: 15 low pitched "ah" given mod cues. Daily task #3: Average 72 dB.  Hierarchal speech loudness drill: Read sentences, 72 dB.  Generate short responses, 71 dB.  Homework: assignments completed.  Off the cuff remarks: average 69 dB (up to 71 dB).   Assessment / Recommendations / Plan   Plan Continue with current plan of care   Progression Toward Goals   Progression toward goals Progressing toward goals          SLP Education - 06/27/15 1634    Education provided Yes   Education Details LSVT-LOUD   Person(s) Educated Patient   Methods Explanation;Demonstration;Verbal cues;Handout   Comprehension Verbalized understanding;Returned demonstration;Verbal cues required;Need further instruction            SLP Long Term Goals - 06/12/15 1532    SLP LONG TERM GOAL #1   Title The patient will complete Daily Tasks (Maximum duration "ah", High/Lows, and Functional Phrases) at average loudness of 80 dB and with loud, good quality voice.    Time 4   Period Weeks   Status New   SLP LONG TERM GOAL #2   Title The patient will complete Hierarchal Speech Loudness reading drills (words/phrases,  sentences, and paragraph) at average 75 dB and with loud, good quality voice.     Time 4   Period Weeks   Status New   SLP LONG TERM GOAL #3   Title The patient will complete homework daily.   Time 4   Period Weeks   Status New   SLP LONG TERM GOAL #4   Title The patient will participate in conversation, maintaining average loudness of 75 dB and loud, good quality voice.   Time 4   Period Weeks   Status New          Plan - 06/27/15 1635    Clinical Impression Statement The patient is completing daily tasks and hierarchal speech drill tasks with louder, better quality voice given fewer SLP cues.  She  is demonstrating emerging generalization into conversational speech.   Speech Therapy Frequency 4x / week   Duration 4 weeks   Treatment/Interventions Other (comment)  LSVT-LOUD   Potential to Achieve Goals Good   Potential Considerations Ability to learn/carryover information;Cooperation/participation level;Previous level of function;Severity of impairments;Family/community support   SLP Home Exercise Plan LSVT-LOUD daily homework   Consulted and Agree with Plan of Care Patient        Problem List There are no active problems to display for this patient.  Leroy Sea, MS/CCC- SLP  Lou Miner 06/27/2015, 4:37 PM  [place] MAIN [facility] SERVICES [address] [place], [place], [address] Phone: [phone]   Fax:  [phone]   Name: KARYSSA TOZER MRN: [id number] Date of Birth: [birth year]

## 2015-07-01 ENCOUNTER — Ambulatory Visit: Payer: PPO | Admitting: Speech Pathology

## 2015-07-01 DIAGNOSIS — R490 Dysphonia: Secondary | ICD-10-CM | POA: Diagnosis not present

## 2015-07-02 ENCOUNTER — Ambulatory Visit: Payer: PPO | Admitting: Speech Pathology

## 2015-07-02 ENCOUNTER — Encounter: Payer: Self-pay | Admitting: Speech Pathology

## 2015-07-02 DIAGNOSIS — R490 Dysphonia: Secondary | ICD-10-CM | POA: Diagnosis not present

## 2015-07-02 NOTE — Therapy (Signed)
[place] MAIN [facility] SERVICES [address] [place], [place], [address] Phone: [phone]   Fax:  [phone]  Speech Language Pathology Treatment  Patient Details  Name: Jacqueline [name] MRN: [id number] Date of Birth: 05/20/1936 Referring Provider: Dr. Melrose Nakayama  Encounter Date: 07/01/2015      End of Session - 07/02/15 0825    Visit Number 8   Number of Visits 17   Date for SLP Re-Evaluation 07/19/15   SLP Start Time 1400   SLP Stop Time  1456   SLP Time Calculation (min) 56 min   Activity Tolerance Patient tolerated treatment well      Past Medical History  Diagnosis Date  . Hypertension   . Collapsed lung   . Parkinson's disease ([place])   . Diabetes mellitus without complication ([place])     Patient takes Insulin twice a day.   . Hypothyroidism   . GERD (gastroesophageal reflux disease)   . Reactive airway disease   . Asthma   . Hyperlipidemia     Past Surgical History  Procedure Laterality Date  . Cholecystectomy    . Abdominal hysterectomy    . Colonoscopy with propofol N/A 03/29/2015    Procedure: COLONOSCOPY WITH PROPOFOL;  Surgeon: Hulen Luster, MD;  Location: [facility] ENDOSCOPY;  Service: Gastroenterology;  Laterality: N/A;  . Esophagogastroduodenoscopy (egd) with propofol N/A 03/29/2015    Procedure: ESOPHAGOGASTRODUODENOSCOPY (EGD) WITH PROPOFOL;  Surgeon: Hulen Luster, MD;  Location: [facility] ENDOSCOPY;  Service: Gastroenterology;  Laterality: N/A;    There were no vitals filed for this visit.  Visit Diagnosis: Dysphonia      Subjective Assessment - 07/02/15 0824    Subjective The patient is using her loud voice with fewer cues   Currently in Pain? No/denies               ADULT SLP TREATMENT - 07/02/15 0001    General Information   Behavior/Cognition Alert;Cooperative;Pleasant mood   HPI Parkinson's disease   Treatment Provided   Treatment provided Cognitive-Linquistic   Pain Assessment   Pain Assessment No/denies  pain   Cognitive-Linquistic Treatment   Treatment focused on Voice   Skilled Treatment Daily Task #1: Average 8 seconds, 83 dB. Daily Task 2: Highs: 15 high pitched "ah" given mod cues. Lows: 15 low pitched "ah" given mod cues. Daily task #3: Average 73 dB.  Hierarchal speech loudness drill: Read sentences, 72 dB.  Generate short responses, 71 dB.  Homework: assignments completed.  Off the cuff remarks: average 70 dB.   Assessment / Recommendations / Plan   Plan Continue with current plan of care   Progression Toward Goals   Progression toward goals Progressing toward goals          SLP Education - 07/02/15 0825    Education provided Yes   Education Details LSVT-LOUD   Person(s) Educated Patient   Methods Explanation;Demonstration;Verbal cues;Handout   Comprehension Verbalized understanding;Returned demonstration;Verbal cues required;Need further instruction            SLP Long Term Goals - 06/12/15 1532    SLP LONG TERM GOAL #1   Title The patient will complete Daily Tasks (Maximum duration "ah", High/Lows, and Functional Phrases) at average loudness of 80 dB and with loud, good quality voice.    Time 4   Period Weeks   Status New   SLP LONG TERM GOAL #2   Title The patient will complete Hierarchal Speech Loudness reading drills (words/phrases, sentences, and paragraph) at  average 75 dB and with loud, good quality voice.     Time 4   Period Weeks   Status New   SLP LONG TERM GOAL #3   Title The patient will complete homework daily.   Time 4   Period Weeks   Status New   SLP LONG TERM GOAL #4   Title The patient will participate in conversation, maintaining average loudness of 75 dB and loud, good quality voice.   Time 4   Period Weeks   Status New          Plan - 07/02/15 [id number]    Clinical Impression Statement The patient is completing daily tasks and hierarchal speech drill tasks with louder, better quality voice given fewer SLP cues.  She is demonstrating  emerging generalization into conversational speech.   Speech Therapy Frequency 4x / week   Duration 4 weeks   Treatment/Interventions Other (comment)  LSVT-LOUD   Potential to Achieve Goals Good   Potential Considerations Ability to learn/carryover information;Cooperation/participation level;Previous level of function;Severity of impairments;Family/community support   SLP Home Exercise Plan LSVT-LOUD daily homework   Consulted and Agree with Plan of Care Patient        Problem List There are no active problems to display for this patient.  Leroy Sea, MS/CCC- SLP  Lou Miner 07/02/2015, 8:27 AM  [place] MAIN [facility] SERVICES [address] [place], [place], [address] Phone: [phone]   Fax:  [phone]   Name: Jacqueline [name] MRN: [id number] Date of Birth: Jan 24, 1936

## 2015-07-02 NOTE — Therapy (Signed)
[place] MAIN [facility] SERVICES [address] [place], [place], [address] Phone: [phone]   Fax:  [phone]  Speech Language Pathology Treatment  Patient Details  Name: Jacqueline [name] MRN: [id number] Date of Birth: 1936/04/30 Referring Provider: Dr. Melrose Nakayama  Encounter Date: 07/02/2015      End of Session - 07/02/15 1505    Visit Number 9   Number of Visits 17   Date for SLP Re-Evaluation 07/19/15   SLP Start Time [id number]   SLP Stop Time  1456   SLP Time Calculation (min) 53 min   Activity Tolerance Patient tolerated treatment well      Past Medical History  Diagnosis Date  . Hypertension   . Collapsed lung   . Parkinson's disease ([place])   . Diabetes mellitus without complication ([place])     Patient takes Insulin twice a day.   . Hypothyroidism   . GERD (gastroesophageal reflux disease)   . Reactive airway disease   . Asthma   . Hyperlipidemia     Past Surgical History  Procedure Laterality Date  . Cholecystectomy    . Abdominal hysterectomy    . Colonoscopy with propofol N/A 03/29/2015    Procedure: COLONOSCOPY WITH PROPOFOL;  Surgeon: Hulen Luster, MD;  Location: [facility] ENDOSCOPY;  Service: Gastroenterology;  Laterality: N/A;  . Esophagogastroduodenoscopy (egd) with propofol N/A 03/29/2015    Procedure: ESOPHAGOGASTRODUODENOSCOPY (EGD) WITH PROPOFOL;  Surgeon: Hulen Luster, MD;  Location: [facility] ENDOSCOPY;  Service: Gastroenterology;  Laterality: N/A;    There were no vitals filed for this visit.  Visit Diagnosis: Dysphonia      Subjective Assessment - 07/02/15 1504    Subjective The patient reports that a friend remarked on her stronger voice   Currently in Pain? No/denies               ADULT SLP TREATMENT - 07/02/15 1504    General Information   Behavior/Cognition Alert;Cooperative;Pleasant mood   HPI Parkinson's disease   Treatment Provided   Treatment provided Cognitive-Linquistic   Pain Assessment   Pain  Assessment No/denies pain   Cognitive-Linquistic Treatment   Treatment focused on Voice   Skilled Treatment Daily Task #1: Average 5 seconds, 83 dB. Daily Task 2: Highs: 15 high pitched "ah" given min cues. Lows: 15 low pitched "ah" given min cues. Daily task #3: Average 74 dB.  Hierarchal speech loudness drill: Generate short responses, 73 dB.  Homework: assignments completed.  Off the cuff remarks: average 70 dB.   Assessment / Recommendations / Plan   Plan Continue with current plan of care   Progression Toward Goals   Progression toward goals Progressing toward goals          SLP Education - 07/02/15 1505    Education provided Yes   Education Details LSVT-LOUD   Person(s) Educated Patient   Methods Explanation;Demonstration;Verbal cues;Handout   Comprehension Verbalized understanding;Returned demonstration;Verbal cues required;Need further instruction            SLP Long Term Goals - 06/12/15 1532    SLP LONG TERM GOAL #1   Title The patient will complete Daily Tasks (Maximum duration "ah", High/Lows, and Functional Phrases) at average loudness of 80 dB and with loud, good quality voice.    Time 4   Period Weeks   Status New   SLP LONG TERM GOAL #2   Title The patient will complete Hierarchal Speech Loudness reading drills (words/phrases, sentences, and paragraph) at average 75 dB and  with loud, good quality voice.     Time 4   Period Weeks   Status New   SLP LONG TERM GOAL #3   Title The patient will complete homework daily.   Time 4   Period Weeks   Status New   SLP LONG TERM GOAL #4   Title The patient will participate in conversation, maintaining average loudness of 75 dB and loud, good quality voice.   Time 4   Period Weeks   Status New          Plan - 07/02/15 1506    Clinical Impression Statement The patient is completing daily tasks and hierarchal speech drill tasks with louder, better quality voice given fewer SLP cues.  She is demonstrating emerging  generalization into conversational speech.   Speech Therapy Frequency 4x / week   Duration 4 weeks   Treatment/Interventions Other (comment)  LSVT-LOUD   Potential to Achieve Goals Good   Potential Considerations Ability to learn/carryover information;Cooperation/participation level;Previous level of function;Severity of impairments;Family/community support   SLP Home Exercise Plan LSVT-LOUD daily homework   Consulted and Agree with Plan of Care Patient        Problem List There are no active problems to display for this patient.  Leroy Sea, MS/CCC- SLP  Lou Miner 07/02/2015, 3:07 PM  [place] MAIN [facility] SERVICES [address] [place], [place], [address] Phone: [phone]   Fax:  [phone]   Name: Jacqueline [name] MRN: [id number] Date of Birth: 1936-02-14

## 2015-07-03 ENCOUNTER — Ambulatory Visit: Payer: PPO | Admitting: Speech Pathology

## 2015-07-03 ENCOUNTER — Encounter: Payer: Self-pay | Admitting: Speech Pathology

## 2015-07-03 DIAGNOSIS — R490 Dysphonia: Secondary | ICD-10-CM

## 2015-07-03 NOTE — Therapy (Signed)
[place] MAIN [facility] SERVICES [address] [place], [place], [address] Phone: [phone]   Fax:  [phone]  Speech Language Pathology Treatment/Progress Note  Patient Details  Name: Jacqueline [name] MRN: [id number] Date of Birth: 1935/12/03 Referring Provider: Dr. Melrose Nakayama  Encounter Date: 07/03/2015      End of Session - 07/03/15 1457    Visit Number 10   Number of Visits 17   Date for SLP Re-Evaluation 07/19/15   SLP Start Time 1400   SLP Stop Time  1455   SLP Time Calculation (min) 55 min   Activity Tolerance Patient tolerated treatment well      Past Medical History  Diagnosis Date  . Hypertension   . Collapsed lung   . Parkinson's disease ([place])   . Diabetes mellitus without complication ([place])     Patient takes Insulin twice a day.   . Hypothyroidism   . GERD (gastroesophageal reflux disease)   . Reactive airway disease   . Asthma   . Hyperlipidemia     Past Surgical History  Procedure Laterality Date  . Cholecystectomy    . Abdominal hysterectomy    . Colonoscopy with propofol N/A 03/29/2015    Procedure: COLONOSCOPY WITH PROPOFOL;  Surgeon: Hulen Luster, MD;  Location: [facility] ENDOSCOPY;  Service: Gastroenterology;  Laterality: N/A;  . Esophagogastroduodenoscopy (egd) with propofol N/A 03/29/2015    Procedure: ESOPHAGOGASTRODUODENOSCOPY (EGD) WITH PROPOFOL;  Surgeon: Hulen Luster, MD;  Location: [facility] ENDOSCOPY;  Service: Gastroenterology;  Laterality: N/A;    There were no vitals filed for this visit.  Visit Diagnosis: Dysphonia      Subjective Assessment - 07/03/15 1457    Subjective The patient reports that a friend remarked on her stronger voice   Currently in Pain? No/denies               ADULT SLP TREATMENT - 07/03/15 0001    General Information   Behavior/Cognition Alert;Cooperative;Pleasant mood   HPI Parkinson's disease   Treatment Provided   Treatment provided Cognitive-Linquistic   Pain Assessment    Pain Assessment No/denies pain   Cognitive-Linquistic Treatment   Treatment focused on Voice   Skilled Treatment Daily Task #1: Average 7 seconds, 83 dB. Daily Task 2: Highs: 15 high pitched "ah" given min cues. Lows: 15 low pitched "ah" given min cues. Daily task #3: Average 72 dB.  Hierarchal speech loudness drill: Generate longer responses, 72 dB.  Homework: assignments completed.  Off the cuff remarks: average 70 dB.   Assessment / Recommendations / Plan   Plan Continue with current plan of care;Goals updated   Progression Toward Goals   Progression toward goals Progressing toward goals          SLP Education - 07/03/15 1457    Education provided Yes   Education Details LSVT-LOUD   Person(s) Educated Patient   Methods Explanation;Demonstration;Verbal cues;Handout   Comprehension Verbalized understanding;Returned demonstration;Verbal cues required;Need further instruction            SLP Long Term Goals - 07/03/15 1458    SLP LONG TERM GOAL #1   Title The patient will complete Daily Tasks (Maximum duration "ah", High/Lows, and Functional Phrases) at average loudness of 80 dB and with loud, good quality voice.    Time 4   Period Weeks   Status Partially Met   SLP LONG TERM GOAL #2   Title The patient will complete Hierarchal Speech Loudness reading drills (words/phrases, sentences, and paragraph) at average  75 dB and with loud, good quality voice.     Time 4   Period Weeks   Status Partially Met   SLP LONG TERM GOAL #3   Title The patient will complete homework daily.   Time 4   Period Weeks   Status On-going   SLP LONG TERM GOAL #4   Title The patient will participate in conversation, maintaining average loudness of 75 dB and loud, good quality voice.   Time 4   Period Weeks   Status Partially Met          Plan - 07/19/2015 1457    Clinical Impression Statement The patient is completing daily tasks and hierarchal speech drill tasks with louder, better quality  voice given fewer SLP cues.  She is demonstrating emerging generalization into conversational speech.   Speech Therapy Frequency 4x / week   Duration 4 weeks   Treatment/Interventions Other (comment)  LSVT-LOUD   Potential to Achieve Goals Good   Potential Considerations Ability to learn/carryover information;Cooperation/participation level;Previous level of function;Severity of impairments;Family/community support   SLP Home Exercise Plan LSVT-LOUD daily homework   Consulted and Agree with Plan of Care Patient          G-Codes - 07-19-2015 1458    Functional Assessment Tool Used LSVT-LOUD Voice protocol, clinical judgment   Functional Limitations Voice   Voice Current Status (G9171) At least 20 percent but less than 40 percent impaired, limited or restricted   Voice Goal Status (G9172) At least 1 percent but less than 20 percent impaired, limited or restricted      Problem List There are no active problems to display for this patient.  Leroy Sea, [place], Susie Jul 19, 2015, 2:59 PM  [place] MAIN [facility] SERVICES [address] [place], [place], [address] Phone: [phone]   Fax:  [phone]   Name: Jacqueline [name] MRN: [id number] Date of Birth: 1935-12-10

## 2015-07-04 ENCOUNTER — Ambulatory Visit: Payer: PPO | Admitting: Speech Pathology

## 2015-07-04 ENCOUNTER — Encounter: Payer: Self-pay | Admitting: Speech Pathology

## 2015-07-04 DIAGNOSIS — R490 Dysphonia: Secondary | ICD-10-CM

## 2015-07-04 NOTE — Therapy (Signed)
[place] MAIN [facility] SERVICES [address] [place], [place], [address] Phone: [phone]   Fax:  [phone]  Speech Language Pathology Treatment  Patient Details  Name: Jacqueline [name] MRN: [id number] Date of Birth: 1936/05/21 Referring Provider: Dr. Melrose Nakayama  Encounter Date: 07/04/2015      End of Session - 07/04/15 1457    Visit Number 11   Number of Visits 17   Date for SLP Re-Evaluation 07/19/15   SLP Start Time 1400   SLP Stop Time  1455   SLP Time Calculation (min) 55 min   Activity Tolerance Patient tolerated treatment well      Past Medical History  Diagnosis Date  . Hypertension   . Collapsed lung   . Parkinson's disease ([place])   . Diabetes mellitus without complication ([place])     Patient takes Insulin twice a day.   . Hypothyroidism   . GERD (gastroesophageal reflux disease)   . Reactive airway disease   . Asthma   . Hyperlipidemia     Past Surgical History  Procedure Laterality Date  . Cholecystectomy    . Abdominal hysterectomy    . Colonoscopy with propofol N/A 03/29/2015    Procedure: COLONOSCOPY WITH PROPOFOL;  Surgeon: Hulen Luster, MD;  Location: [facility] ENDOSCOPY;  Service: Gastroenterology;  Laterality: N/A;  . Esophagogastroduodenoscopy (egd) with propofol N/A 03/29/2015    Procedure: ESOPHAGOGASTRODUODENOSCOPY (EGD) WITH PROPOFOL;  Surgeon: Hulen Luster, MD;  Location: [facility] ENDOSCOPY;  Service: Gastroenterology;  Laterality: N/A;    There were no vitals filed for this visit.  Visit Diagnosis: Dysphonia      Subjective Assessment - 07/04/15 1456    Subjective The patient reports that a friend remarked on her stronger voice   Currently in Pain? No/denies               ADULT SLP TREATMENT - 07/04/15 0001    General Information   Behavior/Cognition Alert;Cooperative;Pleasant mood   HPI Parkinson's disease   Treatment Provided   Treatment provided Cognitive-Linquistic   Pain Assessment   Pain  Assessment No/denies pain   Cognitive-Linquistic Treatment   Treatment focused on Voice   Skilled Treatment Daily Task #1: Average 7 seconds, 83 dB. Daily Task 2: Highs: 15 high pitched "ah" given min cues. Lows: 15 low pitched "ah" given min cues. Daily task #3: Average 72 dB.  Hierarchal speech loudness drill: Generate longer responses, 72 dB.  Homework: assignments completed.  Off the cuff remarks: average 70 dB.   Assessment / Recommendations / Plan   Plan Continue with current plan of care   Progression Toward Goals   Progression toward goals Progressing toward goals          SLP Education - 07/04/15 1456    Education provided Yes   Education Details LSVT-LOUD   Person(s) Educated Patient   Methods Explanation;Demonstration;Verbal cues;Handout   Comprehension Verbalized understanding;Returned demonstration;Verbal cues required;Need further instruction            SLP Long Term Goals - 07/03/15 1458    SLP LONG TERM GOAL #1   Title The patient will complete Daily Tasks (Maximum duration "ah", High/Lows, and Functional Phrases) at average loudness of 80 dB and with loud, good quality voice.    Time 4   Period Weeks   Status Partially Met   SLP LONG TERM GOAL #2   Title The patient will complete Hierarchal Speech Loudness reading drills (words/phrases, sentences, and paragraph) at average 75 dB  and with loud, good quality voice.     Time 4   Period Weeks   Status Partially Met   SLP LONG TERM GOAL #3   Title The patient will complete homework daily.   Time 4   Period Weeks   Status On-going   SLP LONG TERM GOAL #4   Title The patient will participate in conversation, maintaining average loudness of 75 dB and loud, good quality voice.   Time 4   Period Weeks   Status Partially Met          Plan - 07/04/15 1457    Clinical Impression Statement The patient is completing daily tasks and hierarchal speech drill tasks with louder, better quality voice given fewer SLP  cues.  She is demonstrating emerging generalization into conversational speech.   Speech Therapy Frequency 4x / week   Duration 4 weeks   Treatment/Interventions Other (comment)  LSVT-LOUD   Potential to Achieve Goals Good   Potential Considerations Ability to learn/carryover information;Cooperation/participation level;Previous level of function;Severity of impairments;Family/community support   SLP Home Exercise Plan LSVT-LOUD daily homework   Consulted and Agree with Plan of Care Patient          G-Codes - 07/12/2015 1458    Functional Assessment Tool Used LSVT-LOUD Voice protocol, clinical judgment   Functional Limitations Voice   Voice Current Status (G9171) At least 20 percent but less than 40 percent impaired, limited or restricted   Voice Goal Status (G9172) At least 1 percent but less than 20 percent impaired, limited or restricted      Problem List There are no active problems to display for this patient.   Leroy Sea, MS/CCC- SLP  Lou Miner 07/04/2015, 2:58 PM  [place] MAIN [facility] SERVICES [address] [place], [place], [address] Phone: [phone]   Fax:  [phone]   Name: Jacqueline [name] MRN: [id number] Date of Birth: 06/10/1935

## 2015-07-08 ENCOUNTER — Ambulatory Visit: Payer: PPO | Admitting: Speech Pathology

## 2015-07-08 ENCOUNTER — Encounter: Payer: Self-pay | Admitting: Speech Pathology

## 2015-07-08 DIAGNOSIS — R490 Dysphonia: Secondary | ICD-10-CM | POA: Diagnosis not present

## 2015-07-08 NOTE — Therapy (Signed)
[place] [facility] MAIN [facility] SERVICES [address] [place], [place], [address] Phone: [phone]   Fax:  [phone]  Speech Language Pathology Treatment  Patient Details  Name: Jacqueline [name] MRN: [id number] Date of Birth: 1936/05/27 Referring Provider: Dr. Malvin Johns  Encounter Date: 07/08/2015      End of Session - 07/08/15 1450    Visit Number 12   Number of Visits 17   Date for SLP Re-Evaluation 07/19/15   SLP Start Time 1350   SLP Stop Time  1445   SLP Time Calculation (min) 55 min   Activity Tolerance Patient tolerated treatment well      Past Medical History  Diagnosis Date  . Hypertension   . Collapsed lung   . Parkinson's disease (HCC)   . Diabetes mellitus without complication (HCC)     Patient takes Insulin twice a day.   . Hypothyroidism   . GERD (gastroesophageal reflux disease)   . Reactive airway disease   . Asthma   . Hyperlipidemia     Past Surgical History  Procedure Laterality Date  . Cholecystectomy    . Abdominal hysterectomy    . Colonoscopy with propofol N/A 03/29/2015    Procedure: COLONOSCOPY WITH PROPOFOL;  Surgeon: Wallace Cullens, MD;  Location: [facility] ENDOSCOPY;  Service: Gastroenterology;  Laterality: N/A;  . Esophagogastroduodenoscopy (egd) with propofol N/A 03/29/2015    Procedure: ESOPHAGOGASTRODUODENOSCOPY (EGD) WITH PROPOFOL;  Surgeon: Wallace Cullens, MD;  Location: [facility] ENDOSCOPY;  Service: Gastroenterology;  Laterality: N/A;    There were no vitals filed for this visit.  Visit Diagnosis: Dysphonia      Subjective Assessment - 07/08/15 1449    Subjective The patient reports that a friend remarked on her stronger voice   Currently in Pain? No/denies               ADULT SLP TREATMENT - 07/08/15 0001    General Information   Behavior/Cognition Alert;Cooperative;Pleasant mood   HPI Parkinson's disease   Treatment Provided   Treatment provided Cognitive-Linquistic   Pain Assessment   Pain  Assessment No/denies pain   Cognitive-Linquistic Treatment   Treatment focused on Voice   Skilled Treatment Daily Task #1: Average 8 seconds, 83 dB. Daily Task 2: Highs: 15 high pitched "ah" given min cues. Lows: 15 low pitched "ah" given min cues. Daily task #3: Average 72 dB.  Hierarchal speech loudness drill: Generate longer responses, 73 dB.  Homework: assignments completed.  Off the cuff remarks: average 70 dB.   Assessment / Recommendations / Plan   Plan Continue with current plan of care   Progression Toward Goals   Progression toward goals Progressing toward goals          SLP Education - 07/08/15 1449    Education provided Yes   Education Details LSVT-LOUD   Person(s) Educated Patient   Methods Explanation;Demonstration;Verbal cues;Handout   Comprehension Verbalized understanding;Returned demonstration;Verbal cues required;Need further instruction            SLP Long Term Goals - 07/03/15 1458    SLP LONG TERM GOAL #1   Title The patient will complete Daily Tasks (Maximum duration "ah", High/Lows, and Functional Phrases) at average loudness of 80 dB and with loud, good quality voice.    Time 4   Period Weeks   Status Partially Met   SLP LONG TERM GOAL #2   Title The patient will complete Hierarchal Speech Loudness reading drills (words/phrases, sentences, and paragraph) at average 75 dB  and with loud, good quality voice.     Time 4   Period Weeks   Status Partially Met   SLP LONG TERM GOAL #3   Title The patient will complete homework daily.   Time 4   Period Weeks   Status On-going   SLP LONG TERM GOAL #4   Title The patient will participate in conversation, maintaining average loudness of 75 dB and loud, good quality voice.   Time 4   Period Weeks   Status Partially Met          Plan - 07/08/15 1450    Clinical Impression Statement The patient is completing daily tasks and hierarchal speech drill tasks with louder, better quality voice given fewer SLP  cues.  She is demonstrating inconsistent generalization into conversational speech.   Speech Therapy Frequency 4x / week   Duration 4 weeks   Treatment/Interventions Other (comment)  LSVT-LOUD   Potential to Achieve Goals Good   Potential Considerations Ability to learn/carryover information;Cooperation/participation level;Previous level of function;Severity of impairments;Family/community support   SLP Home Exercise Plan LSVT-LOUD daily homework   Consulted and Agree with Plan of Care Patient        Problem List There are no active problems to display for this patient.  Leroy Sea, [place], Susie 07/08/2015, 2:51 PM  [place] MAIN [facility] SERVICES [address] [place], [place], [address] Phone: [phone]   Fax:  [phone]   Name: Jacqueline [name] MRN: [id number] Date of Birth: Oct 21, 1935

## 2015-07-09 ENCOUNTER — Ambulatory Visit: Payer: PPO | Admitting: Speech Pathology

## 2015-07-09 DIAGNOSIS — R490 Dysphonia: Secondary | ICD-10-CM

## 2015-07-10 ENCOUNTER — Encounter: Payer: Self-pay | Admitting: Speech Pathology

## 2015-07-10 ENCOUNTER — Ambulatory Visit: Payer: PPO | Attending: Neurology | Admitting: Speech Pathology

## 2015-07-10 DIAGNOSIS — R490 Dysphonia: Secondary | ICD-10-CM | POA: Insufficient documentation

## 2015-07-10 NOTE — Therapy (Signed)
[place] MAIN [facility] SERVICES [address] [place], [place], [address] Phone: [phone]   Fax:  [phone]  Speech Language Pathology Treatment  Patient Details  Name: Jacqueline [name] MRN: [id number] Date of Birth: 1936-01-14 Referring Provider: Dr. Melrose Nakayama  Encounter Date: 07/10/2015      End of Session - 07/10/15 1544    Visit Number 14   Number of Visits 17   Date for SLP Re-Evaluation 07/19/15   SLP Start Time 1400   SLP Stop Time  1455   SLP Time Calculation (min) 55 min   Activity Tolerance Patient tolerated treatment well      Past Medical History  Diagnosis Date  . Hypertension   . Collapsed lung   . Parkinson's disease ([place])   . Diabetes mellitus without complication ([place])     Patient takes Insulin twice a day.   . Hypothyroidism   . GERD (gastroesophageal reflux disease)   . Reactive airway disease   . Asthma   . Hyperlipidemia     Past Surgical History  Procedure Laterality Date  . Cholecystectomy    . Abdominal hysterectomy    . Colonoscopy with propofol N/A 03/29/2015    Procedure: COLONOSCOPY WITH PROPOFOL;  Surgeon: Hulen Luster, MD;  Location: [facility] ENDOSCOPY;  Service: Gastroenterology;  Laterality: N/A;  . Esophagogastroduodenoscopy (egd) with propofol N/A 03/29/2015    Procedure: ESOPHAGOGASTRODUODENOSCOPY (EGD) WITH PROPOFOL;  Surgeon: Hulen Luster, MD;  Location: [facility] ENDOSCOPY;  Service: Gastroenterology;  Laterality: N/A;    There were no vitals filed for this visit.  Visit Diagnosis: Dysphonia      Subjective Assessment - 07/10/15 1544    Subjective The patient reports that her sister-in-law remarked on her stronger voice   Currently in Pain? No/denies               ADULT SLP TREATMENT - 07/10/15 1543    General Information   Behavior/Cognition Alert;Cooperative;Pleasant mood   HPI Parkinson's disease   Treatment Provided   Treatment provided Cognitive-Linquistic   Pain Assessment   Pain Assessment No/denies pain   Cognitive-Linquistic Treatment   Treatment focused on Voice   Skilled Treatment Daily Task #1: Average 9 seconds, 83 dB. Daily Task 2: Highs: 15 high pitched "ah" given min cues. Lows: 15 low pitched "ah" given min cues. Daily task #3: Average 75 dB.  Hierarchal speech loudness drill: Generate longer responses, 7 dB.  Homework: assignments completed.  Off the cuff remarks: average 72 dB.   Assessment / Recommendations / Plan   Plan Continue with current plan of care   Progression Toward Goals   Progression toward goals Progressing toward goals          SLP Education - 07/10/15 1544    Education provided Yes   Education Details LSVT-LOUD   Person(s) Educated Patient   Methods Explanation;Demonstration;Verbal cues;Handout   Comprehension Verbalized understanding;Returned demonstration;Verbal cues required;Need further instruction            SLP Long Term Goals - 07/03/15 1458    SLP LONG TERM GOAL #1   Title The patient will complete Daily Tasks (Maximum duration "ah", High/Lows, and Functional Phrases) at average loudness of 80 dB and with loud, good quality voice.    Time 4   Period Weeks   Status Partially Met   SLP LONG TERM GOAL #2   Title The patient will complete Hierarchal Speech Loudness reading drills (words/phrases, sentences, and paragraph) at average 75 dB and  with loud, good quality voice.     Time 4   Period Weeks   Status Partially Met   SLP LONG TERM GOAL #3   Title The patient will complete homework daily.   Time 4   Period Weeks   Status On-going   SLP LONG TERM GOAL #4   Title The patient will participate in conversation, maintaining average loudness of 75 dB and loud, good quality voice.   Time 4   Period Weeks   Status Partially Met          Plan - 07/10/15 1545    Clinical Impression Statement The patient is completing daily tasks and hierarchal speech drill tasks with louder, better quality voice given fewer  SLP cues.  She is demonstrating generalization into conversational speech.   Speech Therapy Frequency 4x / week   Duration 4 weeks   Treatment/Interventions Other (comment)  LSVT-LOUD   Potential to Achieve Goals Good   Potential Considerations Ability to learn/carryover information;Cooperation/participation level;Previous level of function;Severity of impairments;Family/community support   SLP Home Exercise Plan LSVT-LOUD daily homework   Consulted and Agree with Plan of Care Patient        Problem List There are no active problems to display for this patient.  Jacqueline [name], Jacqueline [name], Jacqueline [name] 07/10/2015, 3:45 PM  [place] MAIN [facility] SERVICES [address] [place], [place], [address] Phone: [phone]   Fax:  [phone]   Name: Jacqueline [name] MRN: [id number] Date of Birth: 07/13/1935

## 2015-07-10 NOTE — Therapy (Signed)
[place] MAIN [facility] SERVICES [address] [place], [place], [address] Phone: [phone]   Fax:  [phone]  Speech Language Pathology Treatment  Patient Details  Name: Jacqueline [name] MRN: [id number] Date of Birth: November 27, 1935 Referring Provider: Dr. Melrose Nakayama  Encounter Date: 07/09/2015      End of Session - 07/10/15 0807    Visit Number 13   Number of Visits 17   Date for SLP Re-Evaluation 07/19/15   SLP Start Time 1400   SLP Stop Time  1455   SLP Time Calculation (min) 55 min   Activity Tolerance Patient tolerated treatment well      Past Medical History  Diagnosis Date  . Hypertension   . Collapsed lung   . Parkinson's disease ([place])   . Diabetes mellitus without complication ([place])     Patient takes Insulin twice a day.   . Hypothyroidism   . GERD (gastroesophageal reflux disease)   . Reactive airway disease   . Asthma   . Hyperlipidemia     Past Surgical History  Procedure Laterality Date  . Cholecystectomy    . Abdominal hysterectomy    . Colonoscopy with propofol N/A 03/29/2015    Procedure: COLONOSCOPY WITH PROPOFOL;  Surgeon: Hulen Luster, MD;  Location: [facility] ENDOSCOPY;  Service: Gastroenterology;  Laterality: N/A;  . Esophagogastroduodenoscopy (egd) with propofol N/A 03/29/2015    Procedure: ESOPHAGOGASTRODUODENOSCOPY (EGD) WITH PROPOFOL;  Surgeon: Hulen Luster, MD;  Location: [facility] ENDOSCOPY;  Service: Gastroenterology;  Laterality: N/A;    There were no vitals filed for this visit.  Visit Diagnosis: Dysphonia      Subjective Assessment - 07/10/15 0807    Subjective The patient reports that a friend remarked on her stronger voice   Currently in Pain? No/denies               ADULT SLP TREATMENT - 07/10/15 0001    General Information   Behavior/Cognition Alert;Cooperative;Pleasant mood   HPI Parkinson's disease   Treatment Provided   Treatment provided Cognitive-Linquistic   Pain Assessment   Pain  Assessment No/denies pain   Cognitive-Linquistic Treatment   Treatment focused on Voice   Skilled Treatment Daily Task #1: Average 9 seconds, 83 dB. Daily Task 2: Highs: 15 high pitched "ah" given min cues. Lows: 15 low pitched "ah" given min cues. Daily task #3: Average 74 dB.  Hierarchal speech loudness drill: Generate longer responses, 74 dB.  Homework: assignments completed.  Off the cuff remarks: average 71 dB.   Assessment / Recommendations / Plan   Plan Continue with current plan of care   Progression Toward Goals   Progression toward goals Progressing toward goals          SLP Education - 07/10/15 0807    Education provided Yes   Education Details LSVT-LOUD   Person(s) Educated Patient   Methods Explanation;Demonstration;Verbal cues;Handout   Comprehension Verbalized understanding;Returned demonstration;Verbal cues required;Need further instruction            SLP Long Term Goals - 07/03/15 1458    SLP LONG TERM GOAL #1   Title The patient will complete Daily Tasks (Maximum duration "ah", High/Lows, and Functional Phrases) at average loudness of 80 dB and with loud, good quality voice.    Time 4   Period Weeks   Status Partially Met   SLP LONG TERM GOAL #2   Title The patient will complete Hierarchal Speech Loudness reading drills (words/phrases, sentences, and paragraph) at average 75 dB  and with loud, good quality voice.     Time 4   Period Weeks   Status Partially Met   SLP LONG TERM GOAL #3   Title The patient will complete homework daily.   Time 4   Period Weeks   Status On-going   SLP LONG TERM GOAL #4   Title The patient will participate in conversation, maintaining average loudness of 75 dB and loud, good quality voice.   Time 4   Period Weeks   Status Partially Met          Plan - 07/10/15 0808    Clinical Impression Statement The patient is completing daily tasks and hierarchal speech drill tasks with louder, better quality voice given fewer SLP  cues.  She is demonstrating generalization into conversational speech.   Speech Therapy Frequency 4x / week   Duration 4 weeks   Treatment/Interventions Other (comment)  LSVT-LOUD   Potential to Achieve Goals Good   Potential Considerations Ability to learn/carryover information;Cooperation/participation level;Previous level of function;Severity of impairments;Family/community support   SLP Home Exercise Plan LSVT-LOUD daily homework   Consulted and Agree with Plan of Care Patient        Problem List There are no active problems to display for this patient.  Leroy Sea, MS/CCC- SLP  Lou Miner 07/10/2015, 8:09 AM  [place] MAIN [facility] SERVICES [address] [place], [place], [address] Phone: [phone]   Fax:  [phone]   Name: REID NAWROT MRN: [id number] Date of Birth: 01-Dec-1935

## 2015-07-11 ENCOUNTER — Ambulatory Visit: Payer: PPO | Admitting: Speech Pathology

## 2015-07-11 DIAGNOSIS — R490 Dysphonia: Secondary | ICD-10-CM

## 2015-07-12 ENCOUNTER — Encounter: Payer: Self-pay | Admitting: Speech Pathology

## 2015-07-12 ENCOUNTER — Encounter: Payer: PPO | Admitting: Speech Pathology

## 2015-07-12 NOTE — Therapy (Signed)
[place] [facility] MAIN [facility] SERVICES [address] [place], [place], [address] Phone: [phone]   Fax:  [phone]  Speech Language Pathology Treatment  Patient Details  Name: Jacqueline [name] MRN: [id number] Date of Birth: 10-09-1935 Referring Provider: Dr. Malvin Johns  Encounter Date: 07/11/2015      End of Session - 07/12/15 0837    Visit Number 15   Number of Visits 17   Date for SLP Re-Evaluation 07/19/15   SLP Start Time 1400   SLP Stop Time  1457   SLP Time Calculation (min) 57 min   Activity Tolerance Patient tolerated treatment well      Past Medical History  Diagnosis Date  . Hypertension   . Collapsed lung   . Parkinson's disease (HCC)   . Diabetes mellitus without complication (HCC)     Patient takes Insulin twice a day.   . Hypothyroidism   . GERD (gastroesophageal reflux disease)   . Reactive airway disease   . Asthma   . Hyperlipidemia     Past Surgical History  Procedure Laterality Date  . Cholecystectomy    . Abdominal hysterectomy    . Colonoscopy with propofol N/A 03/29/2015    Procedure: COLONOSCOPY WITH PROPOFOL;  Surgeon: Wallace Cullens, MD;  Location: [facility] ENDOSCOPY;  Service: Gastroenterology;  Laterality: N/A;  . Esophagogastroduodenoscopy (egd) with propofol N/A 03/29/2015    Procedure: ESOPHAGOGASTRODUODENOSCOPY (EGD) WITH PROPOFOL;  Surgeon: Wallace Cullens, MD;  Location: [facility] ENDOSCOPY;  Service: Gastroenterology;  Laterality: N/A;    There were no vitals filed for this visit.  Visit Diagnosis: Dysphonia      Subjective Assessment - 07/12/15 0834    Subjective The patient reports that friends and family have  remarked on her stronger voice   Currently in Pain? No/denies               ADULT SLP TREATMENT - 07/12/15 0001    General Information   Behavior/Cognition Alert;Cooperative;Pleasant mood   HPI Parkinson's disease   Treatment Provided   Treatment provided Cognitive-Linquistic   Pain Assessment    Pain Assessment No/denies pain   Cognitive-Linquistic Treatment   Treatment focused on Voice   Skilled Treatment Daily Task #1: Average 9 seconds, 83 dB. Daily Task 2: Highs: 15 high pitched "ah" given min cues. Lows: 15 low pitched "ah" given min cues. Daily task #3: Average 75 dB.  Hierarchal speech loudness drill: Generate longer responses, 75 dB.  Homework: assignments completed.  Off the cuff remarks: average 73 dB.   Assessment / Recommendations / Plan   Plan Continue with current plan of care   Progression Toward Goals   Progression toward goals Progressing toward goals          SLP Education - 07/12/15 0834    Education provided Yes   Education Details LSVT-LOUD   Person(s) Educated Patient   Methods Explanation;Demonstration;Verbal cues;Handout   Comprehension Verbalized understanding;Returned demonstration;Need further instruction            SLP Long Term Goals - 07/03/15 1458    SLP LONG TERM GOAL #1   Title The patient will complete Daily Tasks (Maximum duration "ah", High/Lows, and Functional Phrases) at average loudness of 80 dB and with loud, good quality voice.    Time 4   Period Weeks   Status Partially Met   SLP LONG TERM GOAL #2   Title The patient will complete Hierarchal Speech Loudness reading drills (words/phrases, sentences, and paragraph) at average 75  dB and with loud, good quality voice.     Time 4   Period Weeks   Status Partially Met   SLP LONG TERM GOAL #3   Title The patient will complete homework daily.   Time 4   Period Weeks   Status On-going   SLP LONG TERM GOAL #4   Title The patient will participate in conversation, maintaining average loudness of 75 dB and loud, good quality voice.   Time 4   Period Weeks   Status Partially Met          Plan - 07/12/15 0837    Clinical Impression Statement The patient is completing daily tasks and hierarchal speech drill tasks with louder, better quality voice given fewer SLP cues.  She is  demonstrating generalization into conversational speech.   Speech Therapy Frequency 4x / week   Duration 4 weeks   Treatment/Interventions Other (comment)  LSVT-LOUD   Potential to Achieve Goals Good   Potential Considerations Ability to learn/carryover information;Cooperation/participation level;Previous level of function;Severity of impairments;Family/community support   SLP Home Exercise Plan LSVT-LOUD daily homework   Consulted and Agree with Plan of Care Patient        Problem List There are no active problems to display for this patient.  Leroy Sea, MS/CCC- SLP  Lou Miner 07/12/2015, 8:38 AM  [place] MAIN [facility] SERVICES [address] [place], [place], [address] Phone: [phone]   Fax:  [phone]   Name: Jacqueline [name] MRN: [id number] Date of Birth: [birth year]

## 2015-07-15 ENCOUNTER — Ambulatory Visit: Payer: PPO | Admitting: Speech Pathology

## 2015-07-15 DIAGNOSIS — R490 Dysphonia: Secondary | ICD-10-CM

## 2015-07-16 ENCOUNTER — Ambulatory Visit: Payer: PPO | Admitting: Speech Pathology

## 2015-07-16 ENCOUNTER — Encounter: Payer: Self-pay | Admitting: Speech Pathology

## 2015-07-16 DIAGNOSIS — R490 Dysphonia: Secondary | ICD-10-CM | POA: Diagnosis not present

## 2015-07-16 NOTE — Therapy (Signed)
[place] MAIN [facility] SERVICES [address] [place], [place], [address] Phone: [phone]   Fax:  [phone]  Speech Language Pathology Treatment  Patient Details  Name: Jacqueline [name] MRN: [id number] Date of Birth: 11/19/1935 Referring Provider: Dr. Melrose Nakayama  Encounter Date: 07/15/2015      End of Session - 07/16/15 0848    Visit Number 16   Number of Visits 17   Date for SLP Re-Evaluation 07/19/15   SLP Start Time 31   SLP Stop Time  1456   SLP Time Calculation (min) 56 min   Activity Tolerance Patient tolerated treatment well      Past Medical History  Diagnosis Date  . Hypertension   . Collapsed lung   . Parkinson's disease ([place])   . Diabetes mellitus without complication ([place])     Patient takes Insulin twice a day.   . Hypothyroidism   . GERD (gastroesophageal reflux disease)   . Reactive airway disease   . Asthma   . Hyperlipidemia     Past Surgical History  Procedure Laterality Date  . Cholecystectomy    . Abdominal hysterectomy    . Colonoscopy with propofol N/A 03/29/2015    Procedure: COLONOSCOPY WITH PROPOFOL;  Surgeon: Hulen Luster, MD;  Location: [facility] ENDOSCOPY;  Service: Gastroenterology;  Laterality: N/A;  . Esophagogastroduodenoscopy (egd) with propofol N/A 03/29/2015    Procedure: ESOPHAGOGASTRODUODENOSCOPY (EGD) WITH PROPOFOL;  Surgeon: Hulen Luster, MD;  Location: [facility] ENDOSCOPY;  Service: Gastroenterology;  Laterality: N/A;    There were no vitals filed for this visit.  Visit Diagnosis: Dysphonia      Subjective Assessment - 07/16/15 0847    Subjective The patient reports that friends and family have  remarked on her stronger voice   Currently in Pain? No/denies               ADULT SLP TREATMENT - 07/16/15 0001    General Information   Behavior/Cognition Alert;Cooperative;Pleasant mood   HPI Parkinson's disease   Treatment Provided   Treatment provided Cognitive-Linquistic   Pain Assessment    Pain Assessment No/denies pain   Cognitive-Linquistic Treatment   Treatment focused on Voice   Skilled Treatment Daily Task #1: Average 9 seconds, 83 dB. Daily Task 2: Highs: 15 high pitched "ah" given min cues. Lows: 15 low pitched "ah" given min cues. Daily task #3: Average 75 dB.  Hierarchal speech loudness drill: Generate longer responses, 75 dB.  Homework: assignments completed.  Off the cuff remarks: average 73 dB.   Assessment / Recommendations / Plan   Plan Continue with current plan of care   Progression Toward Goals   Progression toward goals Progressing toward goals          SLP Education - 07/16/15 0848    Education provided Yes   Education Details LSVT-LOUD   Person(s) Educated Patient   Methods Explanation;Demonstration;Verbal cues;Handout   Comprehension Verbalized understanding;Returned demonstration            SLP Long Term Goals - 07/03/15 1458    SLP LONG TERM GOAL #1   Title The patient will complete Daily Tasks (Maximum duration "ah", High/Lows, and Functional Phrases) at average loudness of 80 dB and with loud, good quality voice.    Time 4   Period Weeks   Status Partially Met   SLP LONG TERM GOAL #2   Title The patient will complete Hierarchal Speech Loudness reading drills (words/phrases, sentences, and paragraph) at average 75 dB and  with loud, good quality voice.     Time 4   Period Weeks   Status Partially Met   SLP LONG TERM GOAL #3   Title The patient will complete homework daily.   Time 4   Period Weeks   Status On-going   SLP LONG TERM GOAL #4   Title The patient will participate in conversation, maintaining average loudness of 75 dB and loud, good quality voice.   Time 4   Period Weeks   Status Partially Met          Plan - 07/16/15 0848    Clinical Impression Statement The patient is completing daily tasks and hierarchal speech drill tasks with louder, better quality voice given fewer SLP cues.  She is demonstrating  generalization into conversational speech.   Speech Therapy Frequency 4x / week   Duration 4 weeks   Treatment/Interventions Other (comment)  LSVT-LOUD   Potential to Achieve Goals Good   Potential Considerations Ability to learn/carryover information;Cooperation/participation level;Previous level of function;Severity of impairments;Family/community support   SLP Home Exercise Plan LSVT-LOUD daily homework   Consulted and Agree with Plan of Care Patient        Problem List There are no active problems to display for this patient.  Leroy Sea, MS/CCC- SLP  Lou Miner 07/16/2015, 8:49 AM  [place] MAIN [facility] SERVICES [address] [place], [place], [address] Phone: [phone]   Fax:  [phone]   Name: Jacqueline [name] MRN: [id number] Date of Birth: [birth year]

## 2015-07-17 ENCOUNTER — Encounter: Payer: Self-pay | Admitting: Speech Pathology

## 2015-07-17 NOTE — Therapy (Signed)
[place] MAIN [facility] SERVICES [address] [place], [place], [address] Phone: [phone]   Fax:  [phone]  Speech Language Pathology Treatment/Discharge Summary  Patient Details  Name: Jacqueline [name] MRN: [id number] Date of Birth: 15-May-1936 Referring Provider: Dr. Melrose Nakayama  Encounter Date: 07/16/2015      End of Session - 07/17/15 1122    Visit Number 17   Number of Visits 17   Date for SLP Re-Evaluation 07/19/15   SLP Start Time 1500   SLP Stop Time  1   SLP Time Calculation (min) 56 min   Activity Tolerance Patient tolerated treatment well      Past Medical History  Diagnosis Date  . Hypertension   . Collapsed lung   . Parkinson's disease ([place])   . Diabetes mellitus without complication ([place])     Patient takes Insulin twice a day.   . Hypothyroidism   . GERD (gastroesophageal reflux disease)   . Reactive airway disease   . Asthma   . Hyperlipidemia     Past Surgical History  Procedure Laterality Date  . Cholecystectomy    . Abdominal hysterectomy    . Colonoscopy with propofol N/A 03/29/2015    Procedure: COLONOSCOPY WITH PROPOFOL;  Surgeon: Hulen Luster, MD;  Location: [facility] END[facility]COPY;  Service: Gastroenterology;  Laterality: N/A;  . Esophagogastroduodenoscopy (egd) with propofol N/A 03/29/2015    Procedure: ESOPHAGOGASTRODUODENOSCOPY (EGD) WITH PROPOFOL;  Surgeon: Hulen Luster, MD;  Location: [facility] ENDOSCOPY;  Service: Gastroenterology;  Laterality: N/A;    There were no vitals filed for this visit.  Visit Diagnosis: Dysphonia      Subjective Assessment - 07/17/15 1122    Subjective The patient reports that friends and family have  remarked on her stronger voice   Currently in Pain? No/denies               ADULT SLP TREATMENT - 07/17/15 0001    General Information   Behavior/Cognition Alert;Cooperative;Pleasant mood   HPI Parkinson's disease   Treatment Provided   Treatment provided Cognitive-Linquistic    Pain Assessment   Pain Assessment No/denies pain   Cognitive-Linquistic Treatment   Treatment focused on Voice   Skilled Treatment Daily Task #1: Average 9 seconds, 83 dB. Daily Task 2: Highs: 15 high pitched "ah" given min cues. Lows: 15 low pitched "ah" given min cues. Daily task #3: Average 75 dB.  Hierarchal speech loudness drill: Generate longer responses, 75 dB.  Homework: assignments completed.  Off the cuff remarks: average 73 dB.   Assessment / Recommendations / Plan   Plan Discharge SLP treatment due to (comment)   Progression Toward Goals   Progression toward goals Goals met, education completed, patient discharged from [place] Education - 07/17/15 1122    Education provided Yes   Education Details LSVT-LOUD   Person(s) Educated Patient   Methods Explanation   Comprehension Verbalized understanding            SLP Long Term Goals - 07/17/15 1125    SLP LONG TERM GOAL #1   Title The patient will complete Daily Tasks (Maximum duration "ah", High/Lows, and Functional Phrases) at average loudness of 80 dB and with loud, good quality voice.    Status Achieved   SLP LONG TERM GOAL #2   Title The patient will complete Hierarchal Speech Loudness reading drills (words/phrases, sentences, and paragraph) at average 75 dB and with loud, good quality voice.  Status Achieved   SLP LONG TERM GOAL #3   Title The patient will complete homework daily.   Status Achieved   SLP LONG TERM GOAL #4   Title The patient will participate in conversation, maintaining average loudness of 75 dB and loud, good quality voice.   Status Achieved          Plan - 08-13-2015 1122    Clinical Impression Statement The patient has completed the LSVT-LOUD program and is meeting her goals.  She requires occasional reminders to be loud and clear. The patient is completing daily tasks and hierarchal speech drill tasks with loud, good quality voice.  She demonstrates generalization into  conversational speech and reports friends and family are remarking on her stronger voice.   Speech Therapy Frequency 4x / week   Duration 4 weeks   Treatment/Interventions --  LSVT-LOUD   Potential to Achieve Goals Good   Potential Considerations Ability to learn/carryover information;Cooperation/participation level;Previous level of function;Severity of impairments;Family/community support   SLP Home Exercise Plan LSVT-LOUD daily homework   Consulted and Agree with Plan of Care Patient          G-Codes - 08/13/15 1123    Functional Assessment Tool Used LSVT-LOUD Voice protocol, clinical judgment   Functional Limitations Voice   Voice Current Status (G9171) At least 1 percent but less than 20 percent impaired, limited or restricted   Voice Goal Status (G9172) At least 1 percent but less than 20 percent impaired, limited or restricted   Voice Discharge Status (X7741) At least 1 percent but less than 20 percent impaired, limited or restricted      Problem List There are no active problems to display for this patient.  Leroy Sea, MS/CCC- SLP  Lou Miner August 13, 2015, 11:26 AM  [place] MAIN [facility] SERVICES [address] [place], [place], [address] Phone: [phone]   Fax:  [phone]   Name: Jacqueline [name] MRN: [id number] Date of Birth: 1936-04-22

## 2015-08-06 DIAGNOSIS — F0280 Dementia in other diseases classified elsewhere without behavioral disturbance: Secondary | ICD-10-CM

## 2015-08-06 DIAGNOSIS — F0150 Vascular dementia without behavioral disturbance: Secondary | ICD-10-CM | POA: Insufficient documentation

## 2015-08-06 DIAGNOSIS — G309 Alzheimer's disease, unspecified: Secondary | ICD-10-CM

## 2015-08-07 DIAGNOSIS — E119 Type 2 diabetes mellitus without complications: Secondary | ICD-10-CM | POA: Insufficient documentation

## 2015-10-01 DIAGNOSIS — R2681 Unsteadiness on feet: Secondary | ICD-10-CM | POA: Insufficient documentation

## 2016-01-07 DIAGNOSIS — R1310 Dysphagia, unspecified: Secondary | ICD-10-CM | POA: Insufficient documentation

## 2016-01-09 ENCOUNTER — Other Ambulatory Visit: Payer: Self-pay | Admitting: Neurology

## 2016-01-09 DIAGNOSIS — R1319 Other dysphagia: Secondary | ICD-10-CM

## 2016-01-14 ENCOUNTER — Ambulatory Visit
Admission: RE | Admit: 2016-01-14 | Discharge: 2016-01-14 | Disposition: A | Discharge status: Home / Self Care | Payer: PPO | Source: Ambulatory Visit | Attending: Neurology | Admitting: Neurology

## 2016-01-14 ENCOUNTER — Ambulatory Visit: Payer: PPO

## 2016-01-14 DIAGNOSIS — R1319 Other dysphagia: Secondary | ICD-10-CM | POA: Diagnosis present

## 2016-03-11 ENCOUNTER — Other Ambulatory Visit: Payer: Self-pay | Admitting: Internal Medicine

## 2016-03-11 DIAGNOSIS — R911 Solitary pulmonary nodule: Secondary | ICD-10-CM

## 2016-03-17 ENCOUNTER — Ambulatory Visit
Admission: RE | Admit: 2016-03-17 | Discharge: 2016-03-17 | Disposition: A | Discharge status: Home / Self Care | Payer: PPO | Source: Ambulatory Visit | Attending: Internal Medicine | Admitting: Internal Medicine

## 2016-03-17 DIAGNOSIS — R911 Solitary pulmonary nodule: Secondary | ICD-10-CM | POA: Diagnosis present

## 2016-03-17 DIAGNOSIS — Z9049 Acquired absence of other specified parts of digestive tract: Secondary | ICD-10-CM | POA: Insufficient documentation

## 2016-03-17 LAB — POCT I-STAT CREATININE: Creatinine, Ser: 1 mg/dL (ref 0.44–1.00)

## 2016-03-17 MED ORDER — IOPAMIDOL (ISOVUE-300) INJECTION 61%
60.0000 mL | Freq: Once | INTRAVENOUS | Status: AC | PRN
  Administered 2016-03-17: 60 mL via INTRAVENOUS

## 2016-06-23 ENCOUNTER — Ambulatory Visit: Payer: Self-pay | Admitting: Urology

## 2016-06-24 ENCOUNTER — Ambulatory Visit: Payer: Self-pay | Admitting: Urology

## 2016-06-29 DIAGNOSIS — L97511 Non-pressure chronic ulcer of other part of right foot limited to breakdown of skin: Secondary | ICD-10-CM | POA: Diagnosis not present

## 2016-06-29 DIAGNOSIS — E1142 Type 2 diabetes mellitus with diabetic polyneuropathy: Secondary | ICD-10-CM | POA: Diagnosis not present

## 2016-06-29 DIAGNOSIS — L851 Acquired keratosis [keratoderma] palmaris et plantaris: Secondary | ICD-10-CM | POA: Diagnosis not present

## 2016-07-07 DIAGNOSIS — R413 Other amnesia: Secondary | ICD-10-CM | POA: Diagnosis not present

## 2016-07-07 DIAGNOSIS — R42 Dizziness and giddiness: Secondary | ICD-10-CM | POA: Diagnosis not present

## 2016-07-07 DIAGNOSIS — G20 Parkinson's disease: Secondary | ICD-10-CM | POA: Diagnosis not present

## 2016-07-07 DIAGNOSIS — R5383 Other fatigue: Secondary | ICD-10-CM | POA: Diagnosis not present

## 2016-07-07 DIAGNOSIS — R262 Difficulty in walking, not elsewhere classified: Secondary | ICD-10-CM | POA: Diagnosis not present

## 2016-07-08 ENCOUNTER — Encounter: Payer: Self-pay | Admitting: Urology

## 2016-07-08 ENCOUNTER — Ambulatory Visit (INDEPENDENT_AMBULATORY_CARE_PROVIDER_SITE_OTHER): Payer: Medicare HMO | Admitting: Urology

## 2016-07-08 VITALS — BP 108/67 | HR 85 | Ht 61.0 in | Wt 102.6 lb

## 2016-07-08 DIAGNOSIS — J45909 Unspecified asthma, uncomplicated: Secondary | ICD-10-CM | POA: Insufficient documentation

## 2016-07-08 DIAGNOSIS — R3129 Other microscopic hematuria: Secondary | ICD-10-CM | POA: Diagnosis not present

## 2016-07-08 DIAGNOSIS — E785 Hyperlipidemia, unspecified: Secondary | ICD-10-CM | POA: Insufficient documentation

## 2016-07-08 LAB — URINALYSIS, COMPLETE
Bilirubin, UA: NEGATIVE
Glucose, UA: NEGATIVE
Leukocytes, UA: NEGATIVE
Nitrite, UA: NEGATIVE
Specific Gravity, UA: 1.025 (ref 1.005–1.030)
Urobilinogen, Ur: 0.2 mg/dL (ref 0.2–1.0)
pH, UA: 5.5 (ref 5.0–7.5)

## 2016-07-08 LAB — MICROSCOPIC EXAMINATION

## 2016-07-08 NOTE — Progress Notes (Addendum)
07/08/2016 3:27 PM   Jacqueline [name] March 10, 1936 [id number]  Referring provider: Lavera Guise, MD [address] [place], [place] [address]  Chief Complaint  Patient presents with  . New Patient (Initial Visit)    hematuria referred by Dr. Chancy Milroy    HPI: Patient is a 81 -year-old Caucasian female who presents today as a referral from their PCP, Dr. Humphrey Rolls, for microscopic hematuria who presents with her daughter, [name].    Patient was found to have microscopic hematuria on 05/28/2016 with 3-10 RBC's/hpf.  Patient does have a prior history of microscopic hematuria.  It occurred over ten years ago and "they ran a bunch of tests."  They did not find anything according to the patient.    She does not have a prior history of recurrent urinary tract infections, nephrolithiasis, trauma to the genitourinary tract or malignancies of the genitourinary tract.   She does not have a family medical history of nephrolithiasis, malignancies of the genitourinary tract or hematuria.   Today, she is having symptoms of frequent urination, urgency, nocturia x 4-5 and incontinence.  Her UA today demonstrates 3-10 RBC's.    She not experiencing any suprapubic pain, abdominal pain or flank pain.  She denies any recent fevers, chills, nausea or vomiting.  She has had a contrast CT in 12/2014 and it noted the kidneys enhance the indeterminate small renal lesions have not changed significantly. Delayed images show the pelvocaliceal systems P unremarkable. No hydronephrosis is seen in the proximal ureters are normal in caliber. The abdominal aorta is normal in caliber. The urinary bladder is not well distended. The uterus has been resected. I have indenpendently reviewed the films.    She is not a smoker.  She was exposed to secondhand smoke.  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    PMH: Past Medical History:  Diagnosis Date  . Asthma   . Collapsed lung   . Diabetes mellitus without complication ([place])     Patient takes Insulin twice a day.   Marland Kitchen GERD (gastroesophageal reflux disease)   . Hyperlipidemia   . Hypertension   . Hypothyroidism   . Parkinson's disease ([place])   . Reactive airway disease     Surgical History: Past Surgical History:  Procedure Laterality Date  . ABDOMINAL HYSTERECTOMY    . CHOLECYSTECTOMY    . COLONOSCOPY WITH PROPOFOL N/A 03/29/2015   Procedure: COLONOSCOPY WITH PROPOFOL;  Surgeon: Hulen Luster, MD;  Location: [facility] ENDOSCOPY;  Service: Gastroenterology;  Laterality: N/A;  . ESOPHAGOGASTRODUODENOSCOPY (EGD) WITH PROPOFOL N/A 03/29/2015   Procedure: ESOPHAGOGASTRODUODENOSCOPY (EGD) WITH PROPOFOL;  Surgeon: Hulen Luster, MD;  Location: [facility] ENDOSCOPY;  Service: Gastroenterology;  Laterality: N/A;    Home Medications:  Allergies as of 07/08/2016   No Known Allergies     Medication List       Accurate as of 07/08/16  3:27 PM. Always use your most recent med list.          aspirin EC 81 MG tablet Take 81 mg by mouth daily.   AZILECT 1 MG Tabs tablet Generic drug:  rasagiline Take 1 mg by mouth daily.   BD PEN NEEDLE NANO U/F 32G X 4 MM Misc Generic drug:  Insulin Pen Needle   budesonide-formoterol 80-4.5 MCG/ACT inhaler Commonly known as:  SYMBICORT Inhale 2 puffs into the lungs 2 (two) times daily.   calcium-vitamin D 500-400 MG-UNIT tablet Commonly known as:  OSCAL-500 Take 1 tablet by mouth 2 (two) times daily.  carbidopa-levodopa 25-100 MG tablet Commonly known as:  SINEMET IR Take by mouth.   cephALEXin 500 MG capsule Commonly known as:  KEFLEX Take 1 capsule (500 mg total) by mouth 4 (four) times daily.   diazepam 5 MG tablet Commonly known as:  VALIUM TK 1 OR 2 TS PO 1 HOUR PRIOR TO DENTAL PROCEDURE   donepezil 10 MG tablet Commonly known as:  ARICEPT Take 10 mg by mouth at bedtime.   fluticasone 50 MCG/ACT nasal spray Commonly known as:  FLONASE Place into the nose.   glucose blood test strip Commonly known as:  FREESTYLE  LITE Use as instructed   insulin aspart 100 UNIT/ML FlexPen Commonly known as:  NOVOLOG For use according to sliding scale. Max daily dose 30 units.   insulin lispro protamine-lispro (75-25) 100 UNIT/ML Susp injection Commonly known as:  HUMALOG 75/25 MIX Inject 4 Units into the skin 2 (two) times daily with a meal.   ketorolac 0.5 % ophthalmic solution Commonly known as:  ACULAR INSTILL 1 DROP INTO RIGHT EYE TWO TIMES DAILY   LANTUS SOLOSTAR 100 UNIT/ML Solostar Pen Generic drug:  Insulin Glargine Inject into the skin.   levothyroxine 50 MCG tablet Commonly known as:  SYNTHROID, LEVOTHROID Take 50 mcg by mouth daily before breakfast.   omeprazole 40 MG capsule Commonly known as:  PRILOSEC Take 40 mg by mouth daily.   ondansetron 4 MG tablet Commonly known as:  ZOFRAN Take 1 tablet (4 mg total) by mouth daily as needed for nausea or vomiting.   rOPINIRole 0.5 MG tablet Commonly known as:  REQUIP Take 0.5 mg by mouth at bedtime.   SF 5000 PLUS 1.1 % Crea dental cream Generic drug:  sodium fluoride BRUSH TEETH THOROUGHLY FOR AT LEAST 2 MIN. DO NOT RINSE AFTERWARDS       Allergies: No Known Allergies  Family History: Family History  Problem Relation Age of Onset  . Bladder Cancer Neg Hx   . Kidney cancer Neg Hx     Social History:  reports that she has never smoked. She has never used smokeless tobacco. She reports that she does not drink alcohol or use drugs.  ROS: UROLOGY Frequent Urination?: Yes Hard to postpone urination?: Yes Burning/pain with urination?: No Get up at night to urinate?: Yes Leakage of urine?: Yes Urine stream starts and stops?: No Trouble starting stream?: No Do you have to strain to urinate?: No Blood in urine?: Yes Urinary tract infection?: No Sexually transmitted disease?: No Injury to kidneys or bladder?: No Painful intercourse?: No Weak stream?: No Currently pregnant?: No Vaginal bleeding?: No Last menstrual period?:  n  Gastrointestinal Nausea?: No Vomiting?: No Indigestion/heartburn?: No Diarrhea?: No Constipation?: No  Constitutional Fever: No Night sweats?: Yes Weight loss?: Yes Fatigue?: Yes  Skin Skin rash/lesions?: No Itching?: No  Eyes Blurred vision?: No Double vision?: No  Ears/Nose/Throat Sore throat?: No Sinus problems?: Yes  Hematologic/Lymphatic Swollen glands?: No Easy bruising?: No  Cardiovascular Leg swelling?: No Chest pain?: No  Respiratory Cough?: No Shortness of breath?: No  Endocrine Excessive thirst?: Yes  Musculoskeletal Back pain?: No Joint pain?: No  Neurological Headaches?: No Dizziness?: Yes  Psychologic Depression?: No Anxiety?: No  Physical Exam: BP 108/67 (BP Location: Left Arm, Patient Position: Sitting, Cuff Size: Small)   Pulse 85   Ht 5\' 1"  (1.549 m)   Wt 102 lb 9.6 oz (46.5 kg)   BMI 19.39 kg/m   Constitutional: Well nourished. Alert and oriented, No acute distress. HEENT: [place]  AT, moist mucus membranes. Trachea midline, no masses. Cardiovascular: No clubbing, cyanosis, or edema. Respiratory: Normal respiratory effort, no increased work of breathing. GI: Abdomen is soft, non tender, non distended, no abdominal masses. Liver and spleen not palpable.  No hernias appreciated.  Stool sample for occult testing is not indicated.   GU: No CVA tenderness.  No bladder fullness or masses.   Skin: No rashes, bruises or suspicious lesions. Lymph: No cervical or inguinal adenopathy. Neurologic: Grossly intact, no focal deficits, moving all 4 extremities. Psychiatric: Normal mood and affect.  Laboratory Data: Lab Results  Component Value Date   WBC 5.9 04/16/2015   HGB 14.0 04/16/2015   HCT 42.9 04/16/2015   MCV 91.1 04/16/2015   PLT 174 04/16/2015    Lab Results  Component Value Date   CREATININE 1.00 03/17/2016    Lab Results  Component Value Date   TSH 0.624 11/06/2014      Lab Results  Component Value Date    AST 20 04/16/2015   Lab Results  Component Value Date   ALT 17 04/16/2015     Urinalysis 3-10 RBC's.  See EPIC.  Pertinent Imaging: CLINICAL DATA:  Unintentional weight loss of 60 pounds in 2 years  EXAM: CT ABDOMEN AND PELVIS WITH CONTRAST  TECHNIQUE: Multidetector CT imaging of the abdomen and pelvis was performed using the standard protocol following bolus administration of intravenous contrast.  CONTRAST:  180mL OMNIPAQUE IOHEXOL 300 MG/ML  SOLN  COMPARISON:  CT abdomen pelvis of 10/02/2013  FINDINGS: Probable subpleural linear scarring in the lateral right lower lobe is stable. No infiltrate or effusion is seen. Mild cardiomegaly is unchanged. The liver enhances, and there are prominent central intrahepatic ducts as well as the prominent common bile duct to the ampulla but this is stable compared to the prior CT. The pancreas is unremarkable and the pancreatic duct is not significantly dilated. The adrenal glands and spleen are unremarkable. The stomach is not well distended. The kidneys enhance the indeterminate small renal lesions have not changed significantly. Delayed images show the pelvocaliceal systems P unremarkable. No hydronephrosis is seen in the proximal ureters are normal in caliber. The abdominal aorta is normal in caliber.  The urinary bladder is not well distended. The uterus has been resected. There is a prominent left adnexal lesion with cystic and solid components, measuring 3.7 x 4.0 cm. The prior CT did not include this region, and ultrasound or MRI of the pelvis would be recommended to evaluate further. No free fluid is seen within the pelvis. The colon is largely decompressed. The terminal ileum is unremarkable. The lumbar vertebrae are in normal alignment with no acute abnormality.  IMPRESSION: 1. Solid and cystic lesion in the left adnexa of 3.7 x 4.0 cm. Consider ultrasound of the pelvis or MRI of the pelvis to  evaluate further. 2. Stable mild cardiomegaly. 3. Stable dilated intrahepatic and extrahepatic ducts to the ampulla, apparently stable since 2014, and therefore most consistent with postsurgical change. 4. Stable small indeterminate renal lesions noted previously.   Electronically Signed   By: Ivar Drape M.D.   On: 12/19/2014 16:09   Assessment & Plan:    1. Microscopic hematuria  - I explained to the patient that there are a number of causes that can be associated with blood in the urine, such as stones, UTI's, damage to the urinary tract and/or cancer.  - At this time, I felt that the patient warranted further urologic evaluation.   The AUA guidelines  state that a CT urogram is the preferred imaging study to evaluate hematuria.  - I explained to the patient that a contrast material will be injected into a vein and that in rare instances, an allergic reaction can result and may even life threatening   The patient denies any allergies to contrast, iodine and/or seafood and is not taking metformin.  - Her reproductive status is hysterectomy  - Following the imaging study,  I've recommended a cystoscopy. I described how this is performed, typically in an office setting with a flexible cystoscope. We described the risks, benefits, and possible side effects, the most common of which is a minor amount of blood in the urine and/or burning which usually resolves in 24 to 48 hours.    - The patient had the opportunity to ask questions which were answered. Based upon this discussion, the patient is willing to proceed. Therefore, I've ordered: a CT Urogram and cystoscopy.  - The patient will return following all of the above for discussion of the results.     - Urinalysis, Complete  - CULTURE, URINE COMPREHENSIVE  - BUN+Creat   Return for CT Urogram report and cystoscopy.  These notes generated with voice recognition software. I apologize for typographical errors.  Zara Council,  [facility] [address] [place], [place] [address] [phone]

## 2016-07-11 LAB — CULTURE, URINE COMPREHENSIVE

## 2016-07-14 DIAGNOSIS — E1165 Type 2 diabetes mellitus with hyperglycemia: Secondary | ICD-10-CM | POA: Diagnosis not present

## 2016-07-14 DIAGNOSIS — Z794 Long term (current) use of insulin: Secondary | ICD-10-CM | POA: Diagnosis not present

## 2016-07-21 DIAGNOSIS — Z794 Long term (current) use of insulin: Secondary | ICD-10-CM | POA: Diagnosis not present

## 2016-07-21 DIAGNOSIS — E039 Hypothyroidism, unspecified: Secondary | ICD-10-CM | POA: Diagnosis not present

## 2016-07-21 DIAGNOSIS — Z79899 Other long term (current) drug therapy: Secondary | ICD-10-CM | POA: Diagnosis not present

## 2016-07-21 DIAGNOSIS — E1165 Type 2 diabetes mellitus with hyperglycemia: Secondary | ICD-10-CM | POA: Diagnosis not present

## 2016-07-22 DIAGNOSIS — E119 Type 2 diabetes mellitus without complications: Secondary | ICD-10-CM | POA: Diagnosis not present

## 2016-07-29 ENCOUNTER — Ambulatory Visit
Admission: RE | Admit: 2016-07-29 | Discharge: 2016-07-29 | Disposition: A | Discharge status: Home / Self Care | Payer: Medicare HMO | Source: Ambulatory Visit | Attending: Urology | Admitting: Urology

## 2016-07-29 DIAGNOSIS — Z90710 Acquired absence of both cervix and uterus: Secondary | ICD-10-CM | POA: Insufficient documentation

## 2016-07-29 DIAGNOSIS — I700 Atherosclerosis of aorta: Secondary | ICD-10-CM | POA: Insufficient documentation

## 2016-07-29 DIAGNOSIS — N281 Cyst of kidney, acquired: Secondary | ICD-10-CM | POA: Diagnosis not present

## 2016-07-29 DIAGNOSIS — N83202 Unspecified ovarian cyst, left side: Secondary | ICD-10-CM | POA: Insufficient documentation

## 2016-07-29 DIAGNOSIS — R3129 Other microscopic hematuria: Secondary | ICD-10-CM | POA: Diagnosis not present

## 2016-07-29 MED ORDER — IOPAMIDOL (ISOVUE-300) INJECTION 61%
100.0000 mL | Freq: Once | INTRAVENOUS | Status: AC | PRN
  Administered 2016-07-29: 100 mL via INTRAVENOUS

## 2016-08-03 ENCOUNTER — Ambulatory Visit (INDEPENDENT_AMBULATORY_CARE_PROVIDER_SITE_OTHER): Payer: Medicare HMO | Admitting: Urology

## 2016-08-03 VITALS — BP 107/72 | HR 81 | Ht 62.0 in | Wt 98.7 lb

## 2016-08-03 DIAGNOSIS — R3129 Other microscopic hematuria: Secondary | ICD-10-CM | POA: Diagnosis not present

## 2016-08-03 DIAGNOSIS — N281 Cyst of kidney, acquired: Secondary | ICD-10-CM

## 2016-08-03 LAB — MICROSCOPIC EXAMINATION: Epithelial Cells (non renal): >10 /hpf — ABNORMAL HIGH (ref 0–10)

## 2016-08-03 LAB — URINALYSIS, COMPLETE
Bilirubin, UA: NEGATIVE
Glucose, UA: NEGATIVE
Ketones, UA: NEGATIVE
Nitrite, UA: NEGATIVE
Specific Gravity, UA: 1.01 (ref 1.005–1.030)
Urobilinogen, Ur: 0.2 mg/dL (ref 0.2–1.0)
pH, UA: 6 (ref 5.0–7.5)

## 2016-08-03 MED ORDER — LIDOCAINE HCL 2 % EX GEL
1.0000 "application " | Freq: Once | CUTANEOUS | Status: AC
  Administered 2016-08-03: 1 via URETHRAL

## 2016-08-03 MED ORDER — CIPROFLOXACIN HCL 500 MG PO TABS
500.0000 mg | ORAL_TABLET | Freq: Once | ORAL | Status: AC
  Administered 2016-08-03: 500 mg via ORAL

## 2016-08-03 NOTE — Progress Notes (Signed)
   08/03/16  CC: No chief complaint on file.   HPI: F/u for cystoscopy for MH eval.   1- MH eval -- UA at GU with 3-10 rbc's per hpf Jan 2018. Associated with irritative voiding symptoms, nocturia. NG risk = Parkinsons. CT Urogram (with IV and delays) Feb 2018 showed bilateral renal cysts about 10 per kidney with a left one hyperdense but no obvious enhancement.   2-renal cysts - bilateral Bosniak I and II cysts about 10 in each kidney. Largest on left 1.9 cm, mid-pole endophytic hyperdense.   Chaperone: Mare Ferrari - for exam and cysto.   There were no vitals taken for this visit. NED. A&Ox3.   No respiratory distress   Abd soft, NT, ND Normal external genitalia with patent urethral meatus Bladder and urethra palpably normal  Vagina/introitus appeared normal.   Cystoscopy Procedure Note  Patient identification was confirmed, informed consent was obtained, and patient was prepped using Betadine solution.  Lidocaine jelly was administered per urethral meatus.    Preoperative abx where received prior to procedure.    Procedure: - Flexible cystoscope introduced, without any difficulty.   - Thorough search of the bladder revealed:    normal urethral meatus    normal urothelium    no stones    no ulcers     no tumors    no urethral polyps    no trabeculation Retroflex - normal bladder neck   - Ureteral orifices were normal in position and appearance.  Post-Procedure: - Patient tolerated the procedure well  Assessment/ Plan: 1) MH - benign eval  2) renal cysts - renal u/s in 6 months  3) nocturia - associated with Parkinson's. Check PVR on return.   Festus Aloe, MD

## 2016-09-01 DIAGNOSIS — Z794 Long term (current) use of insulin: Secondary | ICD-10-CM | POA: Diagnosis not present

## 2016-09-01 DIAGNOSIS — E039 Hypothyroidism, unspecified: Secondary | ICD-10-CM | POA: Diagnosis not present

## 2016-09-01 DIAGNOSIS — E1165 Type 2 diabetes mellitus with hyperglycemia: Secondary | ICD-10-CM | POA: Diagnosis not present

## 2016-09-07 DIAGNOSIS — G309 Alzheimer's disease, unspecified: Secondary | ICD-10-CM | POA: Diagnosis not present

## 2016-09-07 DIAGNOSIS — G20 Parkinson's disease: Secondary | ICD-10-CM | POA: Diagnosis not present

## 2016-09-07 DIAGNOSIS — R413 Other amnesia: Secondary | ICD-10-CM | POA: Diagnosis not present

## 2016-09-07 DIAGNOSIS — R262 Difficulty in walking, not elsewhere classified: Secondary | ICD-10-CM | POA: Diagnosis not present

## 2016-09-07 DIAGNOSIS — R69 Illness, unspecified: Secondary | ICD-10-CM | POA: Diagnosis not present

## 2016-09-30 DIAGNOSIS — E119 Type 2 diabetes mellitus without complications: Secondary | ICD-10-CM | POA: Diagnosis not present

## 2016-09-30 DIAGNOSIS — I952 Hypotension due to drugs: Secondary | ICD-10-CM | POA: Diagnosis not present

## 2016-09-30 DIAGNOSIS — I10 Essential (primary) hypertension: Secondary | ICD-10-CM | POA: Diagnosis not present

## 2016-09-30 DIAGNOSIS — E782 Mixed hyperlipidemia: Secondary | ICD-10-CM | POA: Diagnosis not present

## 2016-09-30 DIAGNOSIS — R5383 Other fatigue: Secondary | ICD-10-CM | POA: Diagnosis not present

## 2016-10-06 DIAGNOSIS — L97411 Non-pressure chronic ulcer of right heel and midfoot limited to breakdown of skin: Secondary | ICD-10-CM | POA: Diagnosis not present

## 2016-10-06 DIAGNOSIS — E1142 Type 2 diabetes mellitus with diabetic polyneuropathy: Secondary | ICD-10-CM | POA: Diagnosis not present

## 2016-10-06 DIAGNOSIS — L851 Acquired keratosis [keratoderma] palmaris et plantaris: Secondary | ICD-10-CM | POA: Diagnosis not present

## 2016-10-06 DIAGNOSIS — B351 Tinea unguium: Secondary | ICD-10-CM | POA: Diagnosis not present

## 2016-10-19 DIAGNOSIS — Z794 Long term (current) use of insulin: Secondary | ICD-10-CM | POA: Diagnosis not present

## 2016-10-19 DIAGNOSIS — E1165 Type 2 diabetes mellitus with hyperglycemia: Secondary | ICD-10-CM | POA: Diagnosis not present

## 2016-10-19 DIAGNOSIS — E039 Hypothyroidism, unspecified: Secondary | ICD-10-CM | POA: Diagnosis not present

## 2016-11-10 DIAGNOSIS — Z794 Long term (current) use of insulin: Secondary | ICD-10-CM | POA: Diagnosis not present

## 2016-11-10 DIAGNOSIS — E042 Nontoxic multinodular goiter: Secondary | ICD-10-CM | POA: Diagnosis not present

## 2016-11-10 DIAGNOSIS — E039 Hypothyroidism, unspecified: Secondary | ICD-10-CM | POA: Diagnosis not present

## 2016-11-10 DIAGNOSIS — E1165 Type 2 diabetes mellitus with hyperglycemia: Secondary | ICD-10-CM | POA: Diagnosis not present

## 2016-12-03 DIAGNOSIS — I10 Essential (primary) hypertension: Secondary | ICD-10-CM | POA: Diagnosis not present

## 2016-12-03 DIAGNOSIS — E1165 Type 2 diabetes mellitus with hyperglycemia: Secondary | ICD-10-CM | POA: Diagnosis not present

## 2016-12-03 DIAGNOSIS — R634 Abnormal weight loss: Secondary | ICD-10-CM | POA: Diagnosis not present

## 2016-12-03 DIAGNOSIS — G259 Extrapyramidal and movement disorder, unspecified: Secondary | ICD-10-CM | POA: Diagnosis not present

## 2017-01-11 DIAGNOSIS — R262 Difficulty in walking, not elsewhere classified: Secondary | ICD-10-CM | POA: Diagnosis not present

## 2017-01-11 DIAGNOSIS — G20 Parkinson's disease: Secondary | ICD-10-CM | POA: Diagnosis not present

## 2017-01-11 DIAGNOSIS — R413 Other amnesia: Secondary | ICD-10-CM | POA: Diagnosis not present

## 2017-01-11 DIAGNOSIS — R5383 Other fatigue: Secondary | ICD-10-CM | POA: Diagnosis not present

## 2017-01-11 DIAGNOSIS — E119 Type 2 diabetes mellitus without complications: Secondary | ICD-10-CM | POA: Diagnosis not present

## 2017-01-28 ENCOUNTER — Ambulatory Visit
Admission: RE | Admit: 2017-01-28 | Discharge: 2017-01-28 | Disposition: A | Discharge status: Home / Self Care | Payer: Medicare HMO | Source: Ambulatory Visit | Attending: Urology | Admitting: Urology

## 2017-01-28 DIAGNOSIS — R3129 Other microscopic hematuria: Secondary | ICD-10-CM | POA: Diagnosis present

## 2017-01-28 DIAGNOSIS — N2889 Other specified disorders of kidney and ureter: Secondary | ICD-10-CM | POA: Diagnosis not present

## 2017-01-28 DIAGNOSIS — N281 Cyst of kidney, acquired: Secondary | ICD-10-CM | POA: Diagnosis not present

## 2017-01-29 ENCOUNTER — Telehealth: Payer: Self-pay

## 2017-01-29 NOTE — Telephone Encounter (Signed)
Spoke with pt daughter, [name], in reference to RUS results. [name] stated pt has an appt on Monday with Larene Beach and will discuss further. [name] did state that she does know that she wants the MRI. No orders were placed. Reece Packer to speak with Larene Beach and proceed from there. [name] voiced understanding.

## 2017-01-29 NOTE — Telephone Encounter (Signed)
Festus Aloe, MD  Lestine Box, LPN        Renal US shows cysts in both kidneys -- f/u to review, we may consider and MRI    [facility] [place]

## 2017-01-30 NOTE — Progress Notes (Signed)
02/01/2017 9:46 PM   Jacqueline [name] 1935-07-24 [id number]  Referring provider: Lavera Guise, [place], [place] [address]  Chief Complaint  Patient presents with  . Follow-up    6 months Micro Hematuria/Renal Cyst  . Results    RUS    HPI: 81 yo WF with a history of hematuria who presents today for a 6 month follow up.    Background history Patient is a 38 -year-old Caucasian female who presents today as a referral from their PCP, Dr. Humphrey Rolls, for microscopic hematuria who presents with her daughter, [name].  Patient was found to have microscopic hematuria on 05/28/2016 with 3-10 RBC's/hpf.  Patient does have a prior history of microscopic hematuria.  It occurred over ten years ago and "they ran a bunch of tests."  They did not find anything according to the patient.  She does not have a prior history of recurrent urinary tract infections, nephrolithiasis, trauma to the genitourinary tract or malignancies of the genitourinary tract.  She does not have a family medical history of nephrolithiasis, malignancies of the genitourinary tract or hematuria.   Today, she is having symptoms of frequent urination, urgency, nocturia x 4-5 and incontinence.  Her UA today demonstrates 3-10 RBC's.  She not experiencing any suprapubic pain, abdominal pain or flank pain.  She denies any recent fevers, chills, nausea or vomiting.  She is not a smoker.  She was exposed to secondhand smoke.  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   She has had a contrast CT in 12/2014 and it noted the kidneys enhance the indeterminate small renal lesions have not changed significantly. Delayed images show the pelvocaliceal systems P unremarkable. No hydronephrosis is seen in the proximal ureters are normal in caliber. The abdominal aorta is normal in caliber. The urinary bladder is not well distended. The uterus has been resected.   CTU performed on 07/29/2016 noted no clear explanation for hematuria. No  nephrolithiasis or ureterolithiasis.  Bilateral hemorrhagic and nonhemorrhagic renal cysts. No clear enhancing renal lesions are present. Some lesions are too small to characterize. There are least 10 lesions per kidney.  No bladder stones or filling defects in the bladder which does not excluded a bladder lesion.  Chronic biliary duct dilatation.  Stable cystic change of the LEFT ovary.  Cystoscopy performed on 08/03/2016 with Dr. Junious Silk was negative.  RUS performed on 01/28/2017 noted multiple masses in both kidneys. While it is certainly possible the masses could all represent cystic disease, several are complex in appearance an an underlying solid mass cannot be confidently excluded. An MRI could better evaluate these underlying masses.  Today, she has been experiencing urgency x 4-7, frequency x 4-7, not restricting fluids to avoid visits to the restroom, is engaging in toilet mapping, incontinence x 4-7 and nocturia x 8 or more.   Her PVR is 56 mL.   She has not seen any gross hematuria.  She denies fevers, chills, nausea or vomiting.  She is not having dysuria.   She did not find the Vesicare effective.      PMH: Past Medical History:  Diagnosis Date  . Asthma   . Collapsed lung   . Diabetes mellitus without complication ([place])    Patient takes Insulin twice a day.   Marland Kitchen GERD (gastroesophageal reflux disease)   . Hyperlipidemia   . Hypertension   . Hypothyroidism   . Parkinson's disease ([place])   . Reactive airway disease     Surgical  History: Past Surgical History:  Procedure Laterality Date  . ABDOMINAL HYSTERECTOMY    . CHOLECYSTECTOMY    . COLONOSCOPY WITH PROPOFOL N/A 03/29/2015   Procedure: COLONOSCOPY WITH PROPOFOL;  Surgeon: Hulen Luster, MD;  Location[facility] ENDOSCOPY;  Service: Gastroenterology;  Laterality: N/A;  . ESOPHAGOGASTRODUODENOSCOPY (EGD) WITH PROPOFOL N/A 03/29/2015   Procedure: ESOPHAGOGASTRODUODENOSCOPY (EGD) WITH PROPOFOL;  Surgeon: Hulen Luster, MD;  Location:  [facility] ENDOSCOPY;  Service: Gastroenterology;  Laterality: N/A;  . lung collapse     broken rib from fall punctured lung    Home Medications:  Allergies as of 02/01/2017   No Known Allergies     Medication List       Accurate as of 02/01/17 11:59 PM. Always use your most recent med list.          aspirin EC 81 MG tablet Take 81 mg by mouth daily.   AZILECT 1 MG Tabs tablet Generic drug:  rasagiline Take 1 mg by mouth daily.   BD PEN NEEDLE NANO U/F 32G X 4 MM Misc Generic drug:  Insulin Pen Needle   budesonide-formoterol 80-4.5 MCG/ACT inhaler Commonly known as:  SYMBICORT Inhale 2 puffs into the lungs 2 (two) times daily.   calcium-vitamin D 500-400 MG-UNIT tablet Commonly known as:  OSCAL-500 Take 1 tablet by mouth 2 (two) times daily.   carbidopa-levodopa 25-100 MG tablet Commonly known as:  SINEMET IR Take by mouth.   cephALEXin 500 MG capsule Commonly known as:  KEFLEX Take 1 capsule (500 mg total) by mouth 4 (four) times daily.   citalopram 20 MG tablet Commonly known as:  CELEXA citalopram 20 mg tablet   diazepam 5 MG tablet Commonly known as:  VALIUM Take 30 minutes prior to MRI   donepezil 10 MG tablet Commonly known as:  ARICEPT Take 10 mg by mouth at bedtime.   fluticasone 50 MCG/ACT nasal spray Commonly known as:  FLONASE Place into the nose.   glimepiride 1 MG tablet Commonly known as:  AMARYL glimepiride 1 mg tablet   glucose blood test strip Commonly known as:  FREESTYLE LITE Use as instructed   insulin aspart 100 UNIT/ML FlexPen Commonly known as:  NOVOLOG 8 units   insulin lispro protamine-lispro (75-25) 100 UNIT/ML Susp injection Commonly known as:  HUMALOG 75/25 MIX Inject 4 Units into the skin 2 (two) times daily with a meal.   JANUMET 50-500 MG tablet Generic drug:  sitaGLIPtin-metformin Janumet 50 mg-500 mg tablet   ketorolac 0.5 % ophthalmic solution Commonly known as:  ACULAR INSTILL 1 DROP INTO RIGHT EYE TWO TIMES  DAILY   LANTUS SOLOSTAR 100 UNIT/ML Solostar Pen Generic drug:  Insulin Glargine Inject 2 Units into the skin.   levothyroxine 50 MCG tablet Commonly known as:  SYNTHROID, LEVOTHROID Take 50 mcg by mouth daily before breakfast.   mirabegron ER 25 MG Tb24 tablet Commonly known as:  MYRBETRIQ Take 1 tablet (25 mg total) by mouth daily.   omeprazole 40 MG capsule Commonly known as:  PRILOSEC Take 40 mg by mouth daily.   ondansetron 4 MG tablet Commonly known as:  ZOFRAN Take 1 tablet (4 mg total) by mouth daily as needed for nausea or vomiting.   oxyCODONE-acetaminophen 5-325 MG tablet Commonly known as:  PERCOCET/ROXICET oxycodone-acetaminophen 5 mg-325 mg tablet   pioglitazone 15 MG tablet Commonly known as:  ACTOS pioglitazone 15 mg tablet   rOPINIRole 0.5 MG tablet Commonly known as:  REQUIP Take 0.5 mg by mouth at bedtime.  SF 5000 PLUS 1.1 % Crea dental cream Generic drug:  sodium fluoride BRUSH TEETH THOROUGHLY FOR AT LEAST 2 MIN. DO NOT RINSE AFTERWARDS   VESICARE 5 MG tablet Generic drug:  solifenacin Take by mouth.            Discharge Care Instructions        Start     Ordered   02/01/17 0000  BLADDER SCAN AMB NON-IMAGING     02/01/17 1523   02/01/17 0000  MR Abdomen W Wo Contrast    Comments:  Specimen [id number]: If appointment needs to be changed or cancelled call [phone] ask for Sharman Cheek pt's daughter   Question Answer Comment  If indicated for the ordered procedure, I authorize the administration of contrast media per Radiology protocol Yes   What is the patient's sedation requirement? Anti-anxiety   Does the patient have a pacemaker or implanted devices? No   Radiology Contrast Protocol - do NOT remove file path [email or web address]   Preferred imaging location? [facility] (table limit-350lbs)      02/01/17 1604   02/01/17 0000  mirabegron ER (MYRBETRIQ) 25 MG TB24 tablet  Daily    Question:   Supervising Provider  Answer:  Hollice Espy   02/01/17 1606   02/01/17 0000  diazepam (VALIUM) 5 MG tablet    Question:  Supervising Provider  Answer:  Hollice Espy   02/01/17 1622      Allergies: No Known Allergies  Family History: Family History  Problem Relation Age of Onset  . Bladder Cancer Neg Hx   . Kidney cancer Neg Hx     Social History:  reports that she has never smoked. She has never used smokeless tobacco. She reports that she drinks alcohol. She reports that she does not use drugs.  ROS: UROLOGY Frequent Urination?: Yes Hard to postpone urination?: Yes Burning/pain with urination?: No Get up at night to urinate?: Yes Leakage of urine?: Yes Urine stream starts and stops?: No Trouble starting stream?: No Do you have to strain to urinate?: No Blood in urine?: Yes Urinary tract infection?: No Sexually transmitted disease?: No Injury to kidneys or bladder?: No Painful intercourse?: No Weak stream?: No Currently pregnant?: No Vaginal bleeding?: No Last menstrual period?: n  Gastrointestinal Nausea?: No Vomiting?: No Indigestion/heartburn?: No Diarrhea?: No Constipation?: No  Constitutional Fever: No Night sweats?: No Weight loss?: No Fatigue?: Yes  Skin Skin rash/lesions?: No Itching?: No  Eyes Blurred vision?: Yes Double vision?: No  Ears/Nose/Throat Sore throat?: No Sinus problems?: Yes  Hematologic/Lymphatic Swollen glands?: No Easy bruising?: Yes  Cardiovascular Leg swelling?: No Chest pain?: No  Respiratory Cough?: No Shortness of breath?: Yes  Endocrine Excessive thirst?: Yes  Musculoskeletal Back pain?: No Joint pain?: No  Neurological Headaches?: No Dizziness?: Yes  Psychologic Depression?: No Anxiety?: No  Physical Exam: BP 108/69   Pulse 76   Ht 5\' 1"  (1.549 m)   Wt 104 lb (47.2 kg)   BMI 19.65 kg/m   Constitutional: Well nourished. Alert and oriented, No acute distress. HEENT: [place] AT, moist  mucus membranes. Trachea midline, no masses. Cardiovascular: No clubbing, cyanosis, or edema. Respiratory: Normal respiratory effort, no increased work of breathing. Skin: No rashes, bruises or suspicious lesions. Lymph: No cervical or inguinal adenopathy. Neurologic: Grossly intact, no focal deficits, moving all 4 extremities. Psychiatric: Normal mood and affect.  Laboratory Data: Lab Results  Component Value Date   CREATININE 1.00 03/17/2016   I have reviewed the labs   Pertinent Imaging:  CLINICAL DATA:  Complex hyperdense left renal cyst.  EXAM: RENAL / URINARY TRACT ULTRASOUND COMPLETE  COMPARISON:  None.  FINDINGS: Right Kidney:  Length: 9.8 cm. Cysts are seen in the right kidney. 1 of the cyst is complex with a calcified internal region. This cyst measures 1.8 x 1.3 x 1.8 cm.  Left Kidney:  Length: 9.8 cm. Multiple masses are seen in the left kidney. Some are definitively cysts. However, at least 1 of these masses measuring 1.6 x 1.5 x 1.5 cm is nonspecific and could represent a complex cystic mass. However, a solid mass is not completely excluded.  Bladder:  Appears normal for degree of bladder distention.  IMPRESSION: 1. Multiple masses in both kidneys. While it is certainly possible the masses could all represent cystic disease, several are complex in appearance an an underlying solid mass cannot be confidently excluded. An MRI could better evaluate these underlying masses.   Electronically Signed   By: Dorise Bullion III M.D   On: 01/28/2017 12:51    I have independently reviewed the films  Assessment & Plan:    1. History of hematuria  - hematuria work up completed in 2018 - findings positive for bilateral complex renal cysts  - RTC in one year for UA  - patient to report gross hematuria  2. Bilateral renal cysts  - explained the findings to the patient - advised her that one could not be sure the cysts did not represent RCC -  offered MRI for further evaluation  - she would like to have the MRI for further evaluation of the cysts  - she has Parkinson's so there may be motion artifact - Valium prescription is given  3. Nocturia  - patient given a trial of Myrbetriq 50 mg daily, # 28 samples I have advised the patient of the side effects of Myrbetriq, such as: elevation in BP, urinary retention and/or HA.  - RTC in 3 weeks for OAB questionnaire and PVR  Return for for MRI report.  These notes generated with voice recognition software. I apologize for typographical errors.  Zara Council, [facility] [address] [place], [place] [address] [phone]

## 2017-02-01 ENCOUNTER — Encounter: Payer: Self-pay | Admitting: Urology

## 2017-02-01 ENCOUNTER — Ambulatory Visit: Payer: Medicare HMO

## 2017-02-01 ENCOUNTER — Ambulatory Visit (INDEPENDENT_AMBULATORY_CARE_PROVIDER_SITE_OTHER): Payer: Medicare HMO | Admitting: Urology

## 2017-02-01 VITALS — BP 108/69 | HR 76 | Ht 61.0 in | Wt 104.0 lb

## 2017-02-01 DIAGNOSIS — N281 Cyst of kidney, acquired: Secondary | ICD-10-CM | POA: Diagnosis not present

## 2017-02-01 DIAGNOSIS — Z87448 Personal history of other diseases of urinary system: Secondary | ICD-10-CM

## 2017-02-01 DIAGNOSIS — R351 Nocturia: Secondary | ICD-10-CM

## 2017-02-01 DIAGNOSIS — N2889 Other specified disorders of kidney and ureter: Secondary | ICD-10-CM

## 2017-02-01 MED ORDER — DIAZEPAM 5 MG PO TABS: ORAL_TABLET | ORAL | 0 refills | Status: DC

## 2017-02-01 MED ORDER — MIRABEGRON ER 25 MG PO TB24: 25.0000 mg | ORAL_TABLET | Freq: Every day | ORAL | 0 refills | Status: DC

## 2017-02-10 DIAGNOSIS — G20 Parkinson's disease: Secondary | ICD-10-CM | POA: Diagnosis not present

## 2017-02-10 DIAGNOSIS — R262 Difficulty in walking, not elsewhere classified: Secondary | ICD-10-CM | POA: Diagnosis not present

## 2017-02-10 DIAGNOSIS — I959 Hypotension, unspecified: Secondary | ICD-10-CM | POA: Diagnosis not present

## 2017-02-10 DIAGNOSIS — R413 Other amnesia: Secondary | ICD-10-CM | POA: Diagnosis not present

## 2017-02-10 DIAGNOSIS — E119 Type 2 diabetes mellitus without complications: Secondary | ICD-10-CM | POA: Diagnosis not present

## 2017-02-10 DIAGNOSIS — R42 Dizziness and giddiness: Secondary | ICD-10-CM | POA: Diagnosis not present

## 2017-02-12 DIAGNOSIS — Z794 Long term (current) use of insulin: Secondary | ICD-10-CM | POA: Diagnosis not present

## 2017-02-12 DIAGNOSIS — E039 Hypothyroidism, unspecified: Secondary | ICD-10-CM | POA: Diagnosis not present

## 2017-02-12 DIAGNOSIS — E1165 Type 2 diabetes mellitus with hyperglycemia: Secondary | ICD-10-CM | POA: Diagnosis not present

## 2017-02-23 ENCOUNTER — Ambulatory Visit
Admission: RE | Admit: 2017-02-23 | Discharge: 2017-02-23 | Disposition: A | Discharge status: Home / Self Care | Payer: Medicare HMO | Source: Ambulatory Visit | Attending: Urology | Admitting: Urology

## 2017-02-23 DIAGNOSIS — E119 Type 2 diabetes mellitus without complications: Secondary | ICD-10-CM | POA: Diagnosis not present

## 2017-02-23 DIAGNOSIS — N83202 Unspecified ovarian cyst, left side: Secondary | ICD-10-CM | POA: Diagnosis not present

## 2017-02-23 DIAGNOSIS — N2889 Other specified disorders of kidney and ureter: Secondary | ICD-10-CM

## 2017-02-23 DIAGNOSIS — E063 Autoimmune thyroiditis: Secondary | ICD-10-CM | POA: Diagnosis not present

## 2017-02-23 DIAGNOSIS — E042 Nontoxic multinodular goiter: Secondary | ICD-10-CM | POA: Diagnosis not present

## 2017-02-23 DIAGNOSIS — R319 Hematuria, unspecified: Secondary | ICD-10-CM | POA: Diagnosis not present

## 2017-02-23 DIAGNOSIS — Z794 Long term (current) use of insulin: Secondary | ICD-10-CM | POA: Diagnosis not present

## 2017-02-23 MED ORDER — GADOBENATE DIMEGLUMINE 529 MG/ML IV SOLN
10.0000 mL | Freq: Once | INTRAVENOUS | Status: AC | PRN
  Administered 2017-02-23: 9 mL via INTRAVENOUS

## 2017-02-26 DIAGNOSIS — E119 Type 2 diabetes mellitus without complications: Secondary | ICD-10-CM | POA: Diagnosis not present

## 2017-03-01 DIAGNOSIS — K219 Gastro-esophageal reflux disease without esophagitis: Secondary | ICD-10-CM | POA: Diagnosis not present

## 2017-03-01 DIAGNOSIS — G308 Other Alzheimer's disease: Secondary | ICD-10-CM | POA: Diagnosis not present

## 2017-03-01 DIAGNOSIS — G20 Parkinson's disease: Secondary | ICD-10-CM | POA: Diagnosis not present

## 2017-03-01 DIAGNOSIS — E039 Hypothyroidism, unspecified: Secondary | ICD-10-CM | POA: Diagnosis not present

## 2017-03-01 DIAGNOSIS — R634 Abnormal weight loss: Secondary | ICD-10-CM | POA: Diagnosis not present

## 2017-03-01 DIAGNOSIS — R69 Illness, unspecified: Secondary | ICD-10-CM | POA: Diagnosis not present

## 2017-03-01 DIAGNOSIS — E119 Type 2 diabetes mellitus without complications: Secondary | ICD-10-CM | POA: Diagnosis not present

## 2017-03-01 DIAGNOSIS — J309 Allergic rhinitis, unspecified: Secondary | ICD-10-CM | POA: Diagnosis not present

## 2017-03-01 DIAGNOSIS — J4520 Mild intermittent asthma, uncomplicated: Secondary | ICD-10-CM | POA: Diagnosis not present

## 2017-03-01 DIAGNOSIS — Z0000 Encounter for general adult medical examination without abnormal findings: Secondary | ICD-10-CM | POA: Diagnosis not present

## 2017-03-01 NOTE — Progress Notes (Signed)
03/02/2017 7:56 PM   Jacqueline [name] Oct 29, 1935 [id number]  Referring provider: Lavera Guise, [address] [place] [address], [place] [address]  Chief Complaint  Patient presents with  . Follow-up    MRI results    HPI: 81 yo WF with a history of hematuria who presents today to discuss her MRI results ordered for further evaluation of renal cysts.   Background history Patient is a 30 -year-old Caucasian female who presents today as a referral from their PCP, Dr. Humphrey Rolls, for microscopic hematuria who presents with her daughter, Jacqueline [name].  Patient was found to have microscopic hematuria on 05/28/2016 with 3-10 RBC's/hpf.  Patient does have a prior history of microscopic hematuria.  It occurred over ten years ago and "they ran a bunch of tests."  They did not find anything according to the patient.  She does not have a prior history of recurrent urinary tract infections, nephrolithiasis, trauma to the genitourinary tract or malignancies of the genitourinary tract.  She does not have a family medical history of nephrolithiasis, malignancies of the genitourinary tract or hematuria.   Today, she is having symptoms of frequent urination, urgency, nocturia x 4-5 and incontinence.  Her UA today demonstrates 3-10 RBC's.  She not experiencing any suprapubic pain, abdominal pain or flank pain.  She denies any recent fevers, chills, nausea or vomiting.  She is not a smoker.  She was exposed to secondhand smoke.  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   She has had a contrast CT in 12/2014 and it noted the kidneys enhance the indeterminate small renal lesions have not changed significantly. Delayed images show the pelvocaliceal systems P unremarkable. No hydronephrosis is seen in the proximal ureters are normal in caliber. The abdominal aorta is normal in caliber. The urinary bladder is not well distended. The uterus has been resected.   CTU performed on 07/29/2016 noted no clear explanation for hematuria. No  nephrolithiasis or ureterolithiasis.  Bilateral hemorrhagic and nonhemorrhagic renal cysts. No clear enhancing renal lesions are present. Some lesions are too small to characterize. There are least 10 lesions per kidney.  No bladder stones or filling defects in the bladder which does not excluded a bladder lesion.  Chronic biliary duct dilatation.  Stable cystic change of the LEFT ovary.  Cystoscopy performed on 08/03/2016 with Dr. Junious Silk was negative.  RUS performed on 01/28/2017 noted multiple masses in both kidneys. While it is certainly possible the masses could all represent cystic disease, several are complex in appearance an an underlying solid mass cannot be confidently excluded.    MRI performed on 02/23/2017 noted numerous cysts of varying complexity in both kidneys. All the lesions large enough to be characterized currently demonstrate no appreciable enhancement and are considered to be benign Bosniak category 1 and category 2 cysts. Some of the lesions are technically too small to characterize, especially in light of the degree of motion artifact on today' s exam.  Left ovarian cystic lesion 4.2 by 3.4 cm. This is mildly larger than reported on some prior exams including the prior ultrasound from 01/02/2015. On that exam, an MRI follow up of cystic left ovarian lesion was recommended but I cannot find records of that having been performed. If this exam was not performed, then I do recommend ovarian protocol MRI of the pelvis with and without contrast to assess for ovarian malignancy.  Intrahepatic and extrahepatic biliary dilatation. Abrupt termination of the CBD in the vicinity of the ampulla, significance uncertain. Some of  the dilatation could be a physiologic response to cholecystectomy. Despite efforts by the technologist and patient, motion artifact is present on today's exam and could not be eliminated. This reduces exam sensitivity and specificity.  Today, she has been experiencing  urgency x 4-7 (stable), frequency x 4-7 (stable), not restricting fluids to avoid visits to the restroom, is engaging in toilet mapping, incontinence x 4-7 (stable) and nocturia x 4-7 (improved).   Her PVR is 18 mL.   She has not seen any gross hematuria.  She denies fevers, chills, nausea or vomiting.  She is not having dysuria.   She did not find the Myrbetriq effective.      PMH: Past Medical History:  Diagnosis Date  . Asthma   . Collapsed lung   . Diabetes mellitus without complication ([address])    Patient takes Insulin twice a day.   Marland Kitchen GERD (gastroesophageal reflux disease)   . Hyperlipidemia   . Hypertension   . Hypothyroidism   . Parkinson's disease ([place])   . Reactive airway disease     Surgical History: Past Surgical History:  Procedure Laterality Date  . ABDOMINAL HYSTERECTOMY    . CHOLECYSTECTOMY    . COLONOSCOPY WITH PROPOFOL N/A 03/29/2015   Procedure: COLONOSCOPY WITH PROPOFOL;  Surgeon: Hulen Luster, MD;  Location: [facility] ENDOSCOPY;  Service: Gastroenterology;  Laterality: N/A;  . ESOPHAGOGASTRODUODENOSCOPY (EGD) WITH PROPOFOL N/A 03/29/2015   Procedure: ESOPHAGOGASTRODUODENOSCOPY (EGD) WITH PROPOFOL;  Surgeon: Hulen Luster, MD;  Location: [facility] ENDOSCOPY;  Service: Gastroenterology;  Laterality: N/A;  . lung collapse     broken rib from fall punctured lung    Home Medications:  Allergies as of 03/02/2017   No Known Allergies     Medication List       Accurate as of 03/02/17 11:59 PM. Always use your most recent med list.          aspirin EC 81 MG tablet Take 81 mg by mouth daily.   AZILECT 1 MG Tabs tablet Generic drug:  rasagiline Take 1 mg by mouth daily.   BD PEN NEEDLE NANO U/F 32G X 4 MM Misc Generic drug:  Insulin Pen Needle   budesonide-formoterol 80-4.5 MCG/ACT inhaler Commonly known as:  SYMBICORT Inhale 2 puffs into the lungs 2 (two) times daily.   calcium-vitamin D 500-400 MG-UNIT tablet Commonly known as:  OSCAL-500 Take 1 tablet by mouth 2  (two) times daily.   carbidopa-levodopa 25-100 MG tablet Commonly known as:  SINEMET IR Take by mouth.   cephALEXin 500 MG capsule Commonly known as:  KEFLEX Take 1 capsule (500 mg total) by mouth 4 (four) times daily.   citalopram 20 MG tablet Commonly known as:  CELEXA citalopram 20 mg tablet   diazepam 5 MG tablet Commonly known as:  VALIUM Take 30 minutes prior to MRI   donepezil 10 MG tablet Commonly known as:  ARICEPT Take 10 mg by mouth at bedtime.   fluticasone 50 MCG/ACT nasal spray Commonly known as:  FLONASE Place into the nose.   glimepiride 1 MG tablet Commonly known as:  AMARYL glimepiride 1 mg tablet   glucose blood test strip Commonly known as:  FREESTYLE LITE Use as instructed   insulin aspart 100 UNIT/ML FlexPen Commonly known as:  NOVOLOG 8 units   insulin lispro protamine-lispro (75-25) 100 UNIT/ML Susp injection Commonly known as:  HUMALOG 75/25 MIX Inject 4 Units into the skin 2 (two) times daily with a meal.   JANUMET 50-500 MG tablet Generic  drug:  sitaGLIPtin-metformin Janumet 50 mg-500 mg tablet   ketorolac 0.5 % ophthalmic solution Commonly known as:  ACULAR INSTILL 1 DROP INTO RIGHT EYE TWO TIMES DAILY   LANTUS SOLOSTAR 100 UNIT/ML Solostar Pen Generic drug:  Insulin Glargine Inject 2 Units into the skin.   levothyroxine 50 MCG tablet Commonly known as:  SYNTHROID, LEVOTHROID Take 50 mcg by mouth daily before breakfast.   mirabegron ER 50 MG Tb24 tablet Commonly known as:  MYRBETRIQ Take 1 tablet (50 mg total) by mouth daily.   omeprazole 40 MG capsule Commonly known as:  PRILOSEC Take 40 mg by mouth daily.   ondansetron 4 MG tablet Commonly known as:  ZOFRAN Take 1 tablet (4 mg total) by mouth daily as needed for nausea or vomiting.   oxyCODONE-acetaminophen 5-325 MG tablet Commonly known as:  PERCOCET/ROXICET oxycodone-acetaminophen 5 mg-325 mg tablet   pioglitazone 15 MG tablet Commonly known as:   ACTOS pioglitazone 15 mg tablet   rOPINIRole 0.5 MG tablet Commonly known as:  REQUIP Take 0.5 mg by mouth at bedtime.   SF 5000 PLUS 1.1 % Crea dental cream Generic drug:  sodium fluoride BRUSH TEETH THOROUGHLY FOR AT LEAST 2 MIN. DO NOT RINSE AFTERWARDS   VESICARE 5 MG tablet Generic drug:  solifenacin Take by mouth.            Discharge Care Instructions        Start     Ordered   03/02/17 0000  Bladder Scan (Post Void Residual) in office     03/02/17 1519   03/02/17 0000  mirabegron ER (MYRBETRIQ) 50 MG TB24 tablet  Daily    Question:  Supervising Provider  Answer:  Hollice Espy   03/02/17 1558      Allergies: No Known Allergies  Family History: Family History  Problem Relation Age of Onset  . Bladder Cancer Neg Hx   . Kidney cancer Neg Hx     Social History:  reports that she has never smoked. She has never used smokeless tobacco. She reports that she drinks alcohol. She reports that she does not use drugs.  ROS: UROLOGY Frequent Urination?: Yes Hard to postpone urination?: Yes Burning/pain with urination?: No Get up at night to urinate?: Yes Leakage of urine?: Yes Urine stream starts and stops?: No Trouble starting stream?: No Do you have to strain to urinate?: No Blood in urine?: Yes Urinary tract infection?: No Sexually transmitted disease?: No Injury to kidneys or bladder?: No Painful intercourse?: No Weak stream?: No Currently pregnant?: No Vaginal bleeding?: No Last menstrual period?: n  Gastrointestinal Nausea?: No Vomiting?: No Indigestion/heartburn?: No Diarrhea?: No Constipation?: No  Constitutional Fever: No Night sweats?: No Weight loss?: No Fatigue?: Yes  Skin Skin rash/lesions?: No Itching?: No  Eyes Blurred vision?: No Double vision?: No  Ears/Nose/Throat Sore throat?: No Sinus problems?: Yes  Hematologic/Lymphatic Swollen glands?: No Easy bruising?: Yes  Cardiovascular Leg swelling?: No Chest pain?:  No  Respiratory Cough?: No Shortness of breath?: Yes  Endocrine Excessive thirst?: Yes  Musculoskeletal Back pain?: No Joint pain?: No  Neurological Headaches?: No Dizziness?: No  Psychologic Depression?: No Anxiety?: No  Physical Exam: BP 97/63 (BP Location: Right Arm, Patient Position: Sitting, Cuff Size: Normal)   Pulse 83   Ht 5\' 1"  (1.549 m)   Wt 101 lb 14.4 oz (46.2 kg)   BMI 19.25 kg/m   Constitutional: Well nourished. Alert and oriented, No acute distress. HEENT: [place] AT, moist mucus membranes. Trachea midline, no masses.  Cardiovascular: No clubbing, cyanosis, or edema. Respiratory: Normal respiratory effort, no increased work of breathing. Skin: No rashes, bruises or suspicious lesions. Lymph: No cervical or inguinal adenopathy. Neurologic: Grossly intact, no focal deficits, moving all 4 extremities. Psychiatric: Normal mood and affect.  Laboratory Data: Lab Results  Component Value Date   CREATININE 1.00 03/17/2016   I have reviewed the labs   Pertinent Imaging: CLINICAL DATA:  Hematuria, complex renal lesions for further investigation.  EXAM: MRI ABDOMEN WITHOUT AND WITH CONTRAST  TECHNIQUE: Multiplanar multisequence MR imaging of the abdomen was performed both before and after the administration of intravenous contrast.  CONTRAST:  9 cc MultiHance  COMPARISON:  Multiple exams, including 07/29/2016 CT and 01/29/2015 ultrasound  FINDINGS: Despite efforts by the technologist and patient, motion artifact is present on today's exam and could not be eliminated. This reduces exam sensitivity and specificity.  Lower chest: Unremarkable where included  Hepatobiliary: Intrahepatic and extrahepatic biliary dilatation with common bile duct measuring up to 1.5 cm in diameter. Distal abrupt truncation of the common bile duct on image 10/3 although today's exam was not optimized for assessing the common bile duct. Gallbladder appears to be  absent.  Pancreas:  Unremarkable  Spleen:  Unremarkable  Adrenals/Urinary Tract: There are proximally 6 right and 7 left renal lesions of varying complexity, somewhat accentuated T1 signal and somewhat low T2 signal. As best can be determined from the degree of motion artifact and misregistration, none of these lesions seen to enhance. Some of the smaller lesions are technically too small to characterize even by MRI, particularly in light of the degree of motion artifact.  Adrenal glands normal.  Stomach/Bowel: Unremarkable  Vascular/Lymphatic:  Unremarkable  Other: There is a clustered cystic lesion in the vicinity of the left ovary measuring 4.2 by 3.4 cm on image 8/3.  Musculoskeletal: Dextroconvex thoracolumbar scoliosis.  IMPRESSION: 1. Numerous hepatic cysts of varying complexity in both kidneys. All the lesions large enough to be characterized currently demonstrate no appreciable enhancement and are considered to be benign Bosniak category 1 and category 2 cysts. Some of the lesions are technically too small to characterize, especially in light of the degree of motion artifact on today' s exam. 2. Left ovarian cystic lesion 4.2 by 3.4 cm. This is mildly larger than reported on some prior exams including the prior ultrasound from 01/02/2015. On that exam, an MRI follow up of cystic left ovarian lesion was recommended but I cannot find records of that having been performed. If this exam was not performed, then I do recommend ovarian protocol MRI of the pelvis with and without contrast to assess for ovarian malignancy. 3. Intrahepatic and extrahepatic biliary dilatation. Abrupt termination of the CBD in the vicinity of the ampulla, significance uncertain. Some of the dilatation could be a physiologic response to cholecystectomy. 4. Despite efforts by the technologist and patient, motion artifact is present on today's exam and could not be eliminated. This  reduces exam sensitivity and specificity.   Electronically Signed   By: Van Clines M.D.   On: 02/23/2017 15:19  I have independently reviewed the films  Assessment & Plan:    1. History of hematuria  - hematuria work up completed in 2018 - findings positive for bilateral complex renal cysts  - RTC in one year for UA  - patient to report gross hematuria  2. Bilateral renal cysts  - MRI noted that the cysts are most likely benign, but some of the cysts are too small to  characterize  3. Frequency  - Failed anticholinergics and failed Myrbetriq  - explained the PTNS provides treatment by indirectly providing electrical stimulation to the nerves responsible for bladder and pelvic floor function - a needle electrode generates an adjustable electrical pulse that travels to the sacral plexus via the tibial nerve which is located in the ankle, among other functions, the sacral nerve plexus regulates bladder and pelvic floor function - treatment protocol requires once-a-week treatments for 12 weeks, 30 minutes per session and many patients begin to see improvements by the 6th treatment. Patients who respond to treatment may require occasional treatments (~ once every 3 weeks) to sustain improvements. PTNS is a low-risk procedure. The most common side-effects with PTNS treatment are temporary and minor, resulting from the placement of the needle electrode. They include minor bleeding, mild pain and skin inflammation and patients have seen up to an 80% success rate with this form of treatment  - RTC for PTNS  4. Nocturia  - see above   5. Ovarian cyst  - gave copy of MRI report to patient and her daughter - they will contact Dr. Humphrey Rolls regarding further workup  - refer back to PCP for further management    Return for PTNS.  These notes generated with voice recognition software. I apologize for typographical errors.  Benedicta Sultan, PA-C

## 2017-03-02 ENCOUNTER — Encounter: Payer: Self-pay | Admitting: Urology

## 2017-03-02 ENCOUNTER — Ambulatory Visit (INDEPENDENT_AMBULATORY_CARE_PROVIDER_SITE_OTHER): Payer: Medicare HMO | Admitting: Urology

## 2017-03-02 VITALS — BP 97/63 | HR 83 | Ht 61.0 in | Wt 101.9 lb

## 2017-03-02 DIAGNOSIS — N281 Cyst of kidney, acquired: Secondary | ICD-10-CM

## 2017-03-02 DIAGNOSIS — N83202 Unspecified ovarian cyst, left side: Secondary | ICD-10-CM | POA: Diagnosis not present

## 2017-03-02 DIAGNOSIS — R351 Nocturia: Secondary | ICD-10-CM

## 2017-03-02 DIAGNOSIS — R350 Frequency of micturition: Secondary | ICD-10-CM

## 2017-03-02 DIAGNOSIS — Z87448 Personal history of other diseases of urinary system: Secondary | ICD-10-CM

## 2017-03-02 LAB — BLADDER SCAN AMB NON-IMAGING: Scan Result: 18

## 2017-03-02 MED ORDER — MIRABEGRON ER 50 MG PO TB24: 50.0000 mg | ORAL_TABLET | Freq: Every day | ORAL | 11 refills | Status: DC

## 2017-03-04 ENCOUNTER — Telehealth: Payer: Self-pay | Admitting: *Deleted

## 2017-03-04 NOTE — Telephone Encounter (Signed)
Patient's daughter had called earlier about her mothers PTNS. I LMOM that I had been sick and out of the office all week I just found out about this and with her mothers insurance the procedure will need a prior authorization. I will call her as soon as I have something from insurance and we will set up patient's appointments.

## 2017-03-07 ENCOUNTER — Telehealth: Payer: Self-pay | Admitting: Urology

## 2017-03-07 NOTE — Telephone Encounter (Signed)
Would you send my note to Dr. Clayborn Bigness?

## 2017-03-08 ENCOUNTER — Telehealth: Payer: Self-pay | Admitting: *Deleted

## 2017-03-08 NOTE — Telephone Encounter (Signed)
Notes faxed ° °Michelle ° °

## 2017-03-08 NOTE — Telephone Encounter (Signed)
Called Daughter and left message for her to call office back,  I have spoken to insurance company regarding her mothers PTNS procedure (Mond from [place] states no prior authorization needed for procedure [id number] PTNS call reference number [id number]) and I would like to set her appointments up. Would like for patient to start on October 17th at 1:30.

## 2017-03-09 MED ORDER — MIRABEGRON ER 50 MG PO TB24: 50.0000 mg | ORAL_TABLET | Freq: Every day | ORAL | 11 refills | Status: DC

## 2017-03-09 NOTE — Telephone Encounter (Signed)
Spoke with patient because I could not get in touch with daughter. Patient states Wednesdays at 1:30 will be ok to start her PTNS. Patient states no pacemaker. Patient ok with plan.

## 2017-03-09 NOTE — Telephone Encounter (Signed)
Done. Patient called today stating the myrbetriq medication sent to wrong pharmacy. Needs to be [facility]. Sent RX to correct pharmacy.

## 2017-03-23 NOTE — Progress Notes (Signed)
Chief Complaint:  Chief Complaint  Patient presents with  . PTNS    Freq v/ nocturia     HPI: Patient is a 81 year old Caucasian female who presents today to begin her 12 weekly series of PTNS.  This will be her 1st of 12 weekly treatments.          Today, she is complaining of frequency, urgency and nocturia.  She has been experiencing urgency x 4-7 (stable), frequency x 4-7 (stable), not restricting fluids to avoid visits to the restroom, is engaging in toilet mapping, incontinence x 4-7 (stable) and nocturia x 4-7 (improved).   Her PVR was 18 mL.   She has not seen any gross hematuria.  She denies fevers, chills, nausea or vomiting.  She is not having dysuria.     She did not find the Vesicare or Myrbetriq effective.     Previous Therapy:           Pelvic Floor Exercises           Biofeedback           Bladder Training           Fluid Management           Dietary Restrictions          Contraindications present for PTNS      Pacemaker - NO      Implantable defibrillator - NO      History of abnormal bleeding - NO      History of neuropathies or nerve damage - NO  Discussed with patient possible complications of procedure, such as discomfort, bleeding at insertion/stimulation site, procedure consent signed  Patient goals:     To stop the excessive urination.    Reemphasized that most patient's will see benefit by the 8th week of treatment, but about 10 to 20% of patient may be late responders.  If they happen to be late responders, it is important to continue with the therapy beyond the 12 weekly treatments as many of those patient find benefit.    Treatment #    # 1   # 2   # 3   # 4   # 5   # 6  Overall improvement  Day time voids     5        Night time voids     7        Incontinence episodes     3        Urgency  strong          PMH: Past Medical History:  Diagnosis Date  . Asthma   . Collapsed lung   . Diabetes mellitus without complication ([place])    Patient takes Insulin twice a day.   Marland Kitchen GERD (gastroesophageal reflux disease)   . Hyperlipidemia   . Hypertension   . Hypothyroidism   . Parkinson's disease ([place])   . Reactive airway disease     Surgical History: Past Surgical History:  Procedure Laterality Date  . ABDOMINAL HYSTERECTOMY    . CHOLECYSTECTOMY    . COLONOSCOPY WITH PROPOFOL N/A 03/29/2015   Procedure: COLONOSCOPY WITH PROPOFOL;  Surgeon: Hulen Luster, MD;  Location: [facility] ENDOSCOPY;  Service: Gastroenterology;  Laterality: N/A;  . ESOPHAGOGASTRODUODENOSCOPY (EGD) WITH PROPOFOL N/A 03/29/2015   Procedure: ESOPHAGOGASTRODUODENOSCOPY (EGD) WITH PROPOFOL;  Surgeon: Hulen Luster, MD;  Location: [facility] ENDOSCOPY;  Service: Gastroenterology;  Laterality: N/A;  . lung collapse     broken  rib from fall punctured lung    Home Medications:  Allergies as of 03/24/2017   No Known Allergies     Medication List       Accurate as of 03/24/17  3:05 PM. Always use your most recent med list.          aspirin EC 81 MG tablet Take 81 mg by mouth daily.   AZILECT 1 MG Tabs tablet Generic drug:  rasagiline Take 1 mg by mouth daily.   BD PEN NEEDLE NANO U/F 32G X 4 MM Misc Generic drug:  Insulin Pen Needle   budesonide-formoterol 80-4.5 MCG/ACT inhaler Commonly known as:  SYMBICORT Inhale 2 puffs into the lungs 2 (two) times daily.   calcium-vitamin D 500-400 MG-UNIT tablet Commonly known as:  OSCAL-500 Take 1 tablet by mouth 2 (two) times daily.   carbidopa-levodopa 25-100 MG tablet Commonly known as:  SINEMET IR Take by mouth.   cephALEXin 500 MG capsule Commonly known as:  KEFLEX Take 1 capsule (500 mg total) by mouth 4 (four) times daily.   citalopram 20 MG tablet Commonly known as:  CELEXA citalopram 20 mg tablet   donepezil 10 MG tablet Commonly known as:  ARICEPT Take 10 mg by mouth at bedtime.   fluticasone 50 MCG/ACT nasal spray Commonly known as:  FLONASE Place into the nose.   FREESTYLE LIBRE READER  Devi Use 1 each as needed.   FREESTYLE LIBRE SENSOR SYSTEM Misc Use 1 each every 10 (ten) days. Change sensor every 10 days.  Use to test sugar.   glimepiride 1 MG tablet Commonly known as:  AMARYL glimepiride 1 mg tablet   glucose blood test strip Commonly known as:  FREESTYLE LITE Use as instructed   insulin aspart 100 UNIT/ML FlexPen Commonly known as:  NOVOLOG 10 units   insulin lispro protamine-lispro (75-25) 100 UNIT/ML Susp injection Commonly known as:  HUMALOG 75/25 MIX Inject 4 Units into the skin 2 (two) times daily with a meal.   ketorolac 0.5 % ophthalmic solution Commonly known as:  ACULAR INSTILL 1 DROP INTO RIGHT EYE TWO TIMES DAILY   LANTUS SOLOSTAR 100 UNIT/ML Solostar Pen Generic drug:  Insulin Glargine Inject 2 Units into the skin.   LANTUS SOLOSTAR 100 UNIT/ML Solostar Pen Generic drug:  Insulin Glargine Inject 2 Units into the skin.   levothyroxine 50 MCG tablet Commonly known as:  SYNTHROID, LEVOTHROID Take 50 mcg by mouth daily before breakfast.   mirabegron ER 50 MG Tb24 tablet Commonly known as:  MYRBETRIQ Take 1 tablet (50 mg total) by mouth daily.   omeprazole 40 MG capsule Commonly known as:  PRILOSEC Take 40 mg by mouth daily.   ondansetron 4 MG tablet Commonly known as:  ZOFRAN Take 1 tablet (4 mg total) by mouth daily as needed for nausea or vomiting.   oxyCODONE-acetaminophen 5-325 MG tablet Commonly known as:  PERCOCET/ROXICET oxycodone-acetaminophen 5 mg-325 mg tablet   pioglitazone 15 MG tablet Commonly known as:  ACTOS pioglitazone 15 mg tablet   rOPINIRole 0.5 MG tablet Commonly known as:  REQUIP Take 0.5 mg by mouth at bedtime.   SF 5000 PLUS 1.1 % Crea dental cream Generic drug:  sodium fluoride BRUSH TEETH THOROUGHLY FOR AT LEAST 2 MIN. DO NOT RINSE AFTERWARDS   VESICARE 5 MG tablet Generic drug:  solifenacin Take by mouth.   Vitamin D3 2000 units capsule Take by mouth.   vitamin E 400 UNIT capsule Take  400 Units by mouth daily.  Allergies: No Known Allergies  Family History: Family History  Problem Relation Age of Onset  . Bladder Cancer Neg Hx   . Kidney cancer Neg Hx     Social History:  reports that she has never smoked. She has never used smokeless tobacco. She reports that she drinks alcohol. She reports that she does not use drugs.  ROS: UROLOGY Frequent Urination?: Yes Hard to postpone urination?: Yes Burning/pain with urination?: No Get up at night to urinate?: Yes Leakage of urine?: No Urine stream starts and stops?: No Trouble starting stream?: No Do you have to strain to urinate?: No Blood in urine?: No Urinary tract infection?: No Sexually transmitted disease?: No Injury to kidneys or bladder?: No Painful intercourse?: No Weak stream?: No Currently pregnant?: No Vaginal bleeding?: No Last menstrual period?: n  Gastrointestinal Nausea?: No Vomiting?: No Indigestion/heartburn?: No Diarrhea?: No Constipation?: No  Constitutional Fever: No Night sweats?: No Weight loss?: No Fatigue?: Yes  Skin Skin rash/lesions?: No Itching?: No  Eyes Blurred vision?: No Double vision?: No  Ears/Nose/Throat Sore throat?: No Sinus problems?: Yes  Hematologic/Lymphatic Swollen glands?: No Easy bruising?: No  Cardiovascular Leg swelling?: No Chest pain?: No  Respiratory Cough?: No Shortness of breath?: Yes  Endocrine Excessive thirst?: No  Musculoskeletal Back pain?: No Joint pain?: No  Neurological Headaches?: No Dizziness?: Yes  Psychologic Depression?: No Anxiety?: No   Physical Exam: BP (!) 146/88   Pulse 83   Ht 5\' 3"  (1.6 m)   Wt 101 lb 8 oz (46 kg)   BMI 17.98 kg/m   Constitutional: Well nourished. Alert and oriented, No acute distress. HEENT: [place] AT, moist mucus membranes. Trachea midline, no masses. Cardiovascular: No clubbing, cyanosis, or edema. Respiratory: Normal respiratory effort, no increased work of  breathing. Skin: No rashes, bruises or suspicious lesions. Lymph: No cervical or inguinal adenopathy. Neurologic: Grossly intact, no focal deficits, moving all 4 extremities. Psychiatric: Normal mood and affect.  PTNS treatment: The needle electrode was inserted into the lower, inner aspect of the patient's right leg. The surface electrode was placed on the inside arch of the foot on the treatment leg. The lead set was connected to the stimulator and the needle electrode clip was connected to the needle electrode. The stimulator that produces an adjustable electrical pulse that travels to the sacral nerve plexus via the tibial nerve was increased to 7 until the patient received a sensory response.     Assessment & Plan:    1. Frequency  Treatment Plan:  The needle electrode was removed without difficulty to the patient.  Patient tolerated the procedure for 30 minutes.  She will return next week for # 2 out of 12 of their weekly PTNS treatment's  2. Nocturia  - see above  Return in about 1 week (around 03/31/2017) for # 2 PTNS.  These notes generated with voice recognition software. I apologize for typographical errors.  Zara Council, [facility] [address], [place], [place] [address] [phone]

## 2017-03-24 ENCOUNTER — Encounter: Payer: Self-pay | Admitting: Urology

## 2017-03-24 ENCOUNTER — Ambulatory Visit (INDEPENDENT_AMBULATORY_CARE_PROVIDER_SITE_OTHER): Payer: Medicare HMO | Admitting: Urology

## 2017-03-24 VITALS — BP 146/88 | HR 83 | Ht 63.0 in | Wt 101.5 lb

## 2017-03-24 DIAGNOSIS — R351 Nocturia: Secondary | ICD-10-CM | POA: Diagnosis not present

## 2017-03-24 DIAGNOSIS — R350 Frequency of micturition: Secondary | ICD-10-CM | POA: Diagnosis not present

## 2017-03-24 NOTE — Progress Notes (Signed)
PTNS  Session # *1  Health & Social Factors: First Treatment  Caffeine: 1 Alcohol: 0 Daytime voids #per day: 5 Night-time voids #per night: 7 Urgency: Strong Incontinence Episodes #per day: 3 Ankle used: Right Treatment Setting: 7 Feeling/ Response: Sensory  Preformed By: Zara Council PA-C  Assistant: Lyndee Hensen CMA  Follow Up: One Week

## 2017-03-30 NOTE — Progress Notes (Signed)
Chief Complaint:  Chief Complaint  Patient presents with  . PTNS    Frequency     HPI: Patient is a 81 year old Caucasian female who presents today to begin her 12 weekly series of PTNS.  This will be her 2nd of 12 weekly treatments.          Today, she is complaining of frequency, urgency and nocturia.  She has been experiencing urgency x 4-7 (stable), frequency x 4-7 (stable), not restricting fluids to avoid visits to the restroom, is engaging in toilet mapping, incontinence x 4-7 (stable) and nocturia x 4-7 (improved).   Her PVR was 18 mL.   She has not seen any gross hematuria.  She denies fevers, chills, nausea or vomiting.  She is not having dysuria.     She did not find the Vesicare or Myrbetriq effective.     Previous Therapy:           Pelvic Floor Exercises           Biofeedback           Bladder Training           Fluid Management           Dietary Restrictions          Contraindications present for PTNS      Pacemaker - NO      Implantable defibrillator - NO      History of abnormal bleeding - NO      History of neuropathies or nerve damage - NO  Discussed with patient possible complications of procedure, such as discomfort, bleeding at insertion/stimulation site, procedure consent signed  Patient goals:     To stop the excessive urination.    Reemphasized that most patient's will see benefit by the 8th week of treatment, but about 10 to 20% of patient may be late responders.  If they happen to be late responders, it is important to continue with the therapy beyond the 12 weekly treatments as many of those patient find benefit.    Treatment #    # 1   # 2   # 3   # 4   # 5   # 6  Overall improvement  Day time voids     5    5       Night time voids     7    2       Incontinence episodes     3    2       Urgency  Strong None          PMH: Past Medical History:  Diagnosis Date  . Asthma   . Collapsed lung   . Diabetes mellitus without complication  ([place])    Patient takes Insulin twice a day.   Marland Kitchen GERD (gastroesophageal reflux disease)   . Hyperlipidemia   . Hypertension   . Hypothyroidism   . Parkinson's disease ([place])   . Reactive airway disease     Surgical History: Past Surgical History:  Procedure Laterality Date  . ABDOMINAL HYSTERECTOMY    . CHOLECYSTECTOMY    . COLONOSCOPY WITH PROPOFOL N/A 03/29/2015   Procedure: COLONOSCOPY WITH PROPOFOL;  Surgeon: Hulen Luster, MD;  Location: [facility] ENDOSCOPY;  Service: Gastroenterology;  Laterality: N/A;  . ESOPHAGOGASTRODUODENOSCOPY (EGD) WITH PROPOFOL N/A 03/29/2015   Procedure: ESOPHAGOGASTRODUODENOSCOPY (EGD) WITH PROPOFOL;  Surgeon: Hulen Luster, MD;  Location: [facility] ENDOSCOPY;  Service: Gastroenterology;  Laterality: N/A;  .  lung collapse     broken rib from fall punctured lung    Home Medications:  Allergies as of 03/31/2017   No Known Allergies     Medication List       Accurate as of 03/31/17  3:44 PM. Always use your most recent med list.          aspirin EC 81 MG tablet Take 81 mg by mouth daily.   AZILECT 1 MG Tabs tablet Generic drug:  rasagiline Take 1 mg by mouth daily.   BD PEN NEEDLE NANO U/F 32G X 4 MM Misc Generic drug:  Insulin Pen Needle   budesonide-formoterol 80-4.5 MCG/ACT inhaler Commonly known as:  SYMBICORT Inhale 2 puffs into the lungs 2 (two) times daily.   calcium-vitamin D 500-400 MG-UNIT tablet Commonly known as:  OSCAL-500 Take 1 tablet by mouth 2 (two) times daily.   carbidopa-levodopa 25-100 MG tablet Commonly known as:  SINEMET IR Take by mouth.   cephALEXin 500 MG capsule Commonly known as:  KEFLEX Take 1 capsule (500 mg total) by mouth 4 (four) times daily.   citalopram 20 MG tablet Commonly known as:  CELEXA citalopram 20 mg tablet   donepezil 10 MG tablet Commonly known as:  ARICEPT Take 10 mg by mouth at bedtime.   fluticasone 50 MCG/ACT nasal spray Commonly known as:  FLONASE Place into the nose.   FREESTYLE  LIBRE READER Devi Use 1 each as needed.   FREESTYLE LIBRE SENSOR SYSTEM Misc Use 1 each every 10 (ten) days. Change sensor every 10 days.  Use to test sugar.   glimepiride 1 MG tablet Commonly known as:  AMARYL glimepiride 1 mg tablet   glucose blood test strip Commonly known as:  FREESTYLE LITE Use as instructed   insulin aspart 100 UNIT/ML FlexPen Commonly known as:  NOVOLOG 10 units   insulin lispro protamine-lispro (75-25) 100 UNIT/ML Susp injection Commonly known as:  HUMALOG 75/25 MIX Inject 4 Units into the skin 2 (two) times daily with a meal.   ketorolac 0.5 % ophthalmic solution Commonly known as:  ACULAR INSTILL 1 DROP INTO RIGHT EYE TWO TIMES DAILY   LANTUS SOLOSTAR 100 UNIT/ML Solostar Pen Generic drug:  Insulin Glargine Inject 2 Units into the skin.   LANTUS SOLOSTAR 100 UNIT/ML Solostar Pen Generic drug:  Insulin Glargine Inject 2 Units into the skin.   levothyroxine 50 MCG tablet Commonly known as:  SYNTHROID, LEVOTHROID Take 50 mcg by mouth daily before breakfast.   mirabegron ER 50 MG Tb24 tablet Commonly known as:  MYRBETRIQ Take 1 tablet (50 mg total) by mouth daily.   omeprazole 40 MG capsule Commonly known as:  PRILOSEC Take 40 mg by mouth daily.   ondansetron 4 MG tablet Commonly known as:  ZOFRAN Take 1 tablet (4 mg total) by mouth daily as needed for nausea or vomiting.   oxyCODONE-acetaminophen 5-325 MG tablet Commonly known as:  PERCOCET/ROXICET oxycodone-acetaminophen 5 mg-325 mg tablet   pioglitazone 15 MG tablet Commonly known as:  ACTOS pioglitazone 15 mg tablet   rOPINIRole 0.5 MG tablet Commonly known as:  REQUIP Take 0.5 mg by mouth at bedtime.   SF 5000 PLUS 1.1 % Crea dental cream Generic drug:  sodium fluoride BRUSH TEETH THOROUGHLY FOR AT LEAST 2 MIN. DO NOT RINSE AFTERWARDS   VESICARE 5 MG tablet Generic drug:  solifenacin Take by mouth.   Vitamin D3 2000 units capsule Take by mouth.   vitamin E 400 UNIT  capsule Take 400  Units by mouth daily.       Allergies: No Known Allergies  Family History: Family History  Problem Relation Age of Onset  . Bladder Cancer Neg Hx   . Kidney cancer Neg Hx     Social History:  reports that she has never smoked. She has never used smokeless tobacco. She reports that she drinks alcohol. She reports that she does not use drugs.  ROS: UROLOGY Frequent Urination?: Yes Hard to postpone urination?: Yes Burning/pain with urination?: No Get up at night to urinate?: Yes Leakage of urine?: Yes Urine stream starts and stops?: No Trouble starting stream?: No Do you have to strain to urinate?: No Blood in urine?: No Urinary tract infection?: No Sexually transmitted disease?: No Injury to kidneys or bladder?: No Painful intercourse?: No Weak stream?: No Currently pregnant?: No Vaginal bleeding?: No Last menstrual period?: n  Gastrointestinal Nausea?: No Vomiting?: No Indigestion/heartburn?: No Diarrhea?: No Constipation?: No  Constitutional Fever: No Night sweats?: No Weight loss?: No Fatigue?: No  Skin Skin rash/lesions?: No Itching?: No  Eyes Blurred vision?: No Double vision?: No  Ears/Nose/Throat Sore throat?: No Sinus problems?: Yes  Hematologic/Lymphatic Swollen glands?: No Easy bruising?: No  Cardiovascular Leg swelling?: No Chest pain?: No  Respiratory Cough?: No Shortness of breath?: Yes  Endocrine Excessive thirst?: Yes  Musculoskeletal Back pain?: No Joint pain?: No  Neurological Headaches?: No Dizziness?: Yes  Psychologic Depression?: No Anxiety?: No   Physical Exam: BP (!) 151/82   Pulse 80   Ht 5\' 3"  (1.6 m)   Wt 98 lb 9.6 oz (44.7 kg)   BMI 17.47 kg/m   Constitutional: Well nourished. Alert and oriented, No acute distress. HEENT: [place] AT, moist mucus membranes. Trachea midline, no masses. Cardiovascular: No clubbing, cyanosis, or edema. Respiratory: Normal respiratory effort, no  increased work of breathing. Skin: No rashes, bruises or suspicious lesions. Lymph: No cervical or inguinal adenopathy. Neurologic: Grossly intact, no focal deficits, moving all 4 extremities. Psychiatric: Normal mood and affect.  PTNS treatment: The needle electrode was inserted into the lower, inner aspect of the patient's left leg. The surface electrode was placed on the inside arch of the foot on the treatment leg. The lead set was connected to the stimulator and the needle electrode clip was connected to the needle electrode. The stimulator that produces an adjustable electrical pulse that travels to the sacral nerve plexus via the tibial nerve was increased to 6 until the patient received both a sensory response and a toe flex.   Assessment & Plan:    1. Frequency  Treatment Plan:  The needle electrode was removed without difficulty to the patient.  Patient tolerated the procedure for 30 minutes.  She will return next week for # 3 out of 12 of their weekly PTNS treatment's  2. Nocturia  - see above  Return in about 1 week (around 04/07/2017) for # 3 PTNS.  These notes generated with voice recognition software. I apologize for typographical errors.  Zara Council, [facility] [address] [place], [place] [address] [phone]

## 2017-03-31 ENCOUNTER — Ambulatory Visit (INDEPENDENT_AMBULATORY_CARE_PROVIDER_SITE_OTHER): Payer: Medicare HMO | Admitting: Urology

## 2017-03-31 ENCOUNTER — Encounter: Payer: Self-pay | Admitting: Urology

## 2017-03-31 VITALS — BP 151/82 | HR 80 | Ht 63.0 in | Wt 98.6 lb

## 2017-03-31 DIAGNOSIS — R351 Nocturia: Secondary | ICD-10-CM | POA: Diagnosis not present

## 2017-03-31 DIAGNOSIS — R350 Frequency of micturition: Secondary | ICD-10-CM | POA: Diagnosis not present

## 2017-03-31 NOTE — Progress Notes (Signed)
PTNS  Session # 2  Health & Social Factors: No Change Caffeine: 1 Alcohol: 0 Daytime voids #per day: 5 Night-time voids #per night: 2 Urgency: None Incontinence Episodes #per day: 2 Ankle used: Left Treatment Setting: 6 Feeling/ Response: Both  Preformed By: Zara Council PA-C  Assistant: Lyndee Hensen CMA  Follow Up: One week

## 2017-04-06 NOTE — Progress Notes (Signed)
Chief Complaint:  Chief Complaint  Patient presents with  . PTNS     HPI: Patient is a 81 year old Caucasian female who presents today to begin her 12 weekly series of PTNS.  This will be her 3rd of 12 weekly treatments.          Today, she is complaining of frequency, urgency and nocturia.  She has been experiencing urgency x 4-7 (stable), frequency x 4-7 (stable), not restricting fluids to avoid visits to the restroom, is engaging in toilet mapping, incontinence x 4-7 (stable) and nocturia x 4-7 (improved).   Her PVR was 18 mL.   She has not seen any gross hematuria.  She denies fevers, chills, nausea or vomiting.  She is not having dysuria.     She did not find the Vesicare or Myrbetriq effective.     Previous Therapy:           Pelvic Floor Exercises           Biofeedback           Bladder Training           Fluid Management           Dietary Restrictions          Contraindications present for PTNS      Pacemaker - NO      Implantable defibrillator - NO      History of abnormal bleeding - NO      History of neuropathies or nerve damage - NO  Discussed with patient possible complications of procedure, such as discomfort, bleeding at insertion/stimulation site, procedure consent signed  Patient goals:     To stop the excessive urination.    Reemphasized that most patient's will see benefit by the 8th week of treatment, but about 10 to 20% of patient may be late responders.  If they happen to be late responders, it is important to continue with the therapy beyond the 12 weekly treatments as many of those patient find benefit.  Today, she is experiencing frequency, urgency, nocturia and leakage of urine.  She denies any dysuria, gross hematuria suprapubic pain. She's not had fevers, chills, nausea or vomiting.  Treatment #    # 1   # 2   # 3   # 4   # 5   # 6  Overall improvement  Day time voids     5    5    5       Night time voids     7    2    2       Incontinence  episodes     3    2    2       Urgency  Strong None          PMH: Past Medical History:  Diagnosis Date  . Asthma   . Collapsed lung   . Diabetes mellitus without complication ([place])    Patient takes Insulin twice a day.   Marland Kitchen GERD (gastroesophageal reflux disease)   . Hyperlipidemia   . Hypertension   . Hypothyroidism   . Parkinson's disease ([place])   . Reactive airway disease     Surgical History: Past Surgical History:  Procedure Laterality Date  . ABDOMINAL HYSTERECTOMY    . CHOLECYSTECTOMY    . COLONOSCOPY WITH PROPOFOL N/A 03/29/2015   Procedure: COLONOSCOPY WITH PROPOFOL;  Surgeon: Hulen Luster, MD;  Location: [facility] ENDOSCOPY;  Service: Gastroenterology;  Laterality:  N/A;  . ESOPHAGOGASTRODUODENOSCOPY (EGD) WITH PROPOFOL N/A 03/29/2015   Procedure: ESOPHAGOGASTRODUODENOSCOPY (EGD) WITH PROPOFOL;  Surgeon: Hulen Luster, MD;  Location: [facility] ENDOSCOPY;  Service: Gastroenterology;  Laterality: N/A;  . lung collapse     broken rib from fall punctured lung    Home Medications:  Allergies as of 04/07/2017   No Known Allergies     Medication List       Accurate as of 04/07/17  2:09 PM. Always use your most recent med list.          aspirin EC 81 MG tablet Take 81 mg by mouth daily.   AZILECT 1 MG Tabs tablet Generic drug:  rasagiline Take 1 mg by mouth daily.   BD PEN NEEDLE NANO U/F 32G X 4 MM Misc Generic drug:  Insulin Pen Needle   budesonide-formoterol 80-4.5 MCG/ACT inhaler Commonly known as:  SYMBICORT Inhale 2 puffs into the lungs 2 (two) times daily.   calcium-vitamin D 500-400 MG-UNIT tablet Commonly known as:  OSCAL-500 Take 1 tablet by mouth 2 (two) times daily.   carbidopa-levodopa 25-100 MG tablet Commonly known as:  SINEMET IR Take by mouth.   cephALEXin 500 MG capsule Commonly known as:  KEFLEX Take 1 capsule (500 mg total) by mouth 4 (four) times daily.   citalopram 20 MG tablet Commonly known as:  CELEXA citalopram 20 mg tablet   donepezil  10 MG tablet Commonly known as:  ARICEPT Take 10 mg by mouth at bedtime.   fluticasone 50 MCG/ACT nasal spray Commonly known as:  FLONASE Place into the nose.   FREESTYLE LIBRE READER Devi Use 1 each as needed.   FREESTYLE LIBRE SENSOR SYSTEM Misc Use 1 each every 10 (ten) days. Change sensor every 10 days.  Use to test sugar.   glimepiride 1 MG tablet Commonly known as:  AMARYL glimepiride 1 mg tablet   glucose blood test strip Commonly known as:  FREESTYLE LITE Use as instructed   insulin aspart 100 UNIT/ML FlexPen Commonly known as:  NOVOLOG 10 units   insulin lispro protamine-lispro (75-25) 100 UNIT/ML Susp injection Commonly known as:  HUMALOG 75/25 MIX Inject 4 Units into the skin 2 (two) times daily with a meal.   ketorolac 0.5 % ophthalmic solution Commonly known as:  ACULAR INSTILL 1 DROP INTO RIGHT EYE TWO TIMES DAILY   LANTUS SOLOSTAR 100 UNIT/ML Solostar Pen Generic drug:  Insulin Glargine Inject 2 Units into the skin.   levothyroxine 50 MCG tablet Commonly known as:  SYNTHROID, LEVOTHROID Take 50 mcg by mouth daily before breakfast.   mirabegron ER 50 MG Tb24 tablet Commonly known as:  MYRBETRIQ Take 1 tablet (50 mg total) by mouth daily.   omeprazole 40 MG capsule Commonly known as:  PRILOSEC Take 40 mg by mouth daily.   rOPINIRole 0.5 MG tablet Commonly known as:  REQUIP Take 0.5 mg by mouth at bedtime.   SF 5000 PLUS 1.1 % Crea dental cream Generic drug:  sodium fluoride BRUSH TEETH THOROUGHLY FOR AT LEAST 2 MIN. DO NOT RINSE AFTERWARDS   Vitamin D3 2000 units capsule Take by mouth.   vitamin E 400 UNIT capsule Take 400 Units by mouth daily.       Allergies: No Known Allergies  Family History: Family History  Problem Relation Age of Onset  . Bladder Cancer Neg Hx   . Kidney cancer Neg Hx     Social History:  reports that she has never smoked. She has never used smokeless  tobacco. She reports that she drinks alcohol. She  reports that she does not use drugs.  ROS: UROLOGY Frequent Urination?: Yes Hard to postpone urination?: Yes Burning/pain with urination?: No Get up at night to urinate?: Yes Leakage of urine?: Yes Urine stream starts and stops?: No Trouble starting stream?: No Do you have to strain to urinate?: No Blood in urine?: No Urinary tract infection?: No Sexually transmitted disease?: No Injury to kidneys or bladder?: No Painful intercourse?: No Weak stream?: No Currently pregnant?: No Vaginal bleeding?: No Last menstrual period?: n  Gastrointestinal Nausea?: No Vomiting?: No Indigestion/heartburn?: No Diarrhea?: No Constipation?: No  Constitutional Fever: No Night sweats?: No Weight loss?: Yes Fatigue?: No  Skin Skin rash/lesions?: No Itching?: No  Eyes Blurred vision?: No Double vision?: No  Ears/Nose/Throat Sore throat?: Yes Sinus problems?: Yes  Hematologic/Lymphatic Swollen glands?: No Easy bruising?: No  Cardiovascular Leg swelling?: No Chest pain?: No  Respiratory Cough?: No Shortness of breath?: Yes  Endocrine Excessive thirst?: Yes  Musculoskeletal Back pain?: No Joint pain?: No  Neurological Headaches?: No Dizziness?: Yes  Psychologic Depression?: No Anxiety?: No   Physical Exam: BP 102/64 (BP Location: Right Arm, Patient Position: Sitting, Cuff Size: Normal)   Pulse 84   Ht 5\' 3"  (1.6 m)   Wt 101 lb 6.4 oz (46 kg)   BMI 17.96 kg/m   Constitutional: Well nourished. Alert and oriented, No acute distress. HEENT: [place] AT, moist mucus membranes. Trachea midline, no masses. Cardiovascular: No clubbing, cyanosis, or edema. Respiratory: Normal respiratory effort, no increased work of breathing. Skin: No rashes, bruises or suspicious lesions. Lymph: No cervical or inguinal adenopathy. Neurologic: Grossly intact, no focal deficits, moving all 4 extremities. Psychiatric: Normal mood and affect.  PTNS treatment: The needle electrode  was inserted into the lower, inner aspect of the patient's right leg. The surface electrode was placed on the inside arch of the foot on the treatment leg. The lead set was connected to the stimulator and the needle electrode clip was connected to the needle electrode. The stimulator that produces an adjustable electrical pulse that travels to the sacral nerve plexus via the tibial nerve was increased to 8 until the patient received both a sensory response and a toe flex.   Assessment & Plan:    1. Frequency  Treatment Plan:  The needle electrode was removed without difficulty to the patient.  Patient tolerated the procedure for 30 minutes.  She will return next week for # 4 out of 12 of their weekly PTNS treatment's  2. Nocturia  - see above  Return in about 1 week (around 04/14/2017) for # 4 PTNS.  These notes generated with voice recognition software. I apologize for typographical errors.  Zara Council, [facility] [address] [place], [place] [address] [phone]

## 2017-04-07 ENCOUNTER — Encounter: Payer: Self-pay | Admitting: Urology

## 2017-04-07 ENCOUNTER — Ambulatory Visit: Payer: Medicare HMO | Admitting: Urology

## 2017-04-07 VITALS — BP 102/64 | HR 84 | Ht 63.0 in | Wt 101.4 lb

## 2017-04-07 DIAGNOSIS — R351 Nocturia: Secondary | ICD-10-CM

## 2017-04-07 DIAGNOSIS — R350 Frequency of micturition: Secondary | ICD-10-CM

## 2017-04-07 NOTE — Progress Notes (Signed)
PTNS  Session # 3  Health & Social Factors: no change Caffeine: 1 Alcohol: 0 Daytime voids #per day: 5 Night-time voids #per night: 2 Urgency: none Incontinence Episodes #per day: 2 Ankle used: right Treatment Setting: 8 Feeling/ Response: both Comments: Patient tolerated well.  Preformed By: Zara Council, PA-C  Assistant: Elberta Leatherwood, CMA  Follow Up: 1 week

## 2017-04-13 DIAGNOSIS — E118 Type 2 diabetes mellitus with unspecified complications: Secondary | ICD-10-CM | POA: Diagnosis not present

## 2017-04-13 DIAGNOSIS — G20 Parkinson's disease: Secondary | ICD-10-CM | POA: Diagnosis not present

## 2017-04-13 DIAGNOSIS — G3184 Mild cognitive impairment, so stated: Secondary | ICD-10-CM | POA: Diagnosis not present

## 2017-04-13 DIAGNOSIS — G478 Other sleep disorders: Secondary | ICD-10-CM | POA: Diagnosis not present

## 2017-04-13 DIAGNOSIS — R42 Dizziness and giddiness: Secondary | ICD-10-CM | POA: Diagnosis not present

## 2017-04-13 DIAGNOSIS — R251 Tremor, unspecified: Secondary | ICD-10-CM | POA: Diagnosis not present

## 2017-04-13 NOTE — Progress Notes (Signed)
Chief Complaint:  Chief Complaint  Patient presents with  . PTNS     HPI: Patient is a 81 year old Caucasian female who presents today to begin her 12 weekly series of PTNS.  This will be her 4th of 12 weekly treatments.          Baseline complaints of frequency, urgency and nocturia.  She has been experiencing urgency x 4-7 (stable), frequency x 4-7 (stable), not restricting fluids to avoid visits to the restroom, is engaging in toilet mapping, incontinence x 4-7 (stable) and nocturia x 4-7 (improved).   Her PVR was 18 mL.   She has not seen any gross hematuria.  She denies fevers, chills, nausea or vomiting.  She is not having dysuria.     She did not find the Vesicare or Myrbetriq effective.     Previous Therapy:           Pelvic Floor Exercises           Biofeedback           Bladder Training           Fluid Management           Dietary Restrictions          Contraindications present for PTNS      Pacemaker - NO      Implantable defibrillator - NO      History of abnormal bleeding - NO      History of neuropathies or nerve damage - NO  Discussed with patient possible complications of procedure, such as discomfort, bleeding at insertion/stimulation site, procedure consent signed  Patient goals:     To stop the excessive urination.    Reemphasized that most patient's will see benefit by the 8th week of treatment, but about 10 to 20% of patient may be late responders.  If they happen to be late responders, it is important to continue with the therapy beyond the 12 weekly treatments as many of those patient find benefit.  Today, she is experiencing frequency, urgency, nocturia and leakage of urine.  She denies any dysuria, gross hematuria suprapubic pain. She's not had fevers, chills, nausea or vomiting.  Treatment #    # 1   # 2   # 3   # 4   # 5   # 6  Overall improvement  Day time voids     5    5    5    5      Night time voids     7    2    2    3      Incontinence  episodes     3    2    2    2      Urgency  Strong None  None Strong       PMH: Past Medical History:  Diagnosis Date  . Asthma   . Collapsed lung   . Diabetes mellitus without complication ([place])    Patient takes Insulin twice a day.   Marland Kitchen GERD (gastroesophageal reflux disease)   . Hyperlipidemia   . Hypertension   . Hypothyroidism   . Parkinson's disease ([place])   . Reactive airway disease     Surgical History: Past Surgical History:  Procedure Laterality Date  . ABDOMINAL HYSTERECTOMY    . CHOLECYSTECTOMY    . lung collapse     broken rib from fall punctured lung    Home Medications:  Allergies  as of 04/14/2017   No Known Allergies     Medication List        Accurate as of 04/14/17 11:59 PM. Always use your most recent med list.          aspirin EC 81 MG tablet Take 81 mg by mouth daily.   AZILECT 1 MG Tabs tablet Generic drug:  rasagiline Take 1 mg by mouth daily.   BD PEN NEEDLE NANO U/F 32G X 4 MM Misc Generic drug:  Insulin Pen Needle   budesonide-formoterol 80-4.5 MCG/ACT inhaler Commonly known as:  SYMBICORT Inhale 2 puffs into the lungs 2 (two) times daily.   calcium-vitamin D 500-400 MG-UNIT tablet Commonly known as:  OSCAL-500 Take 1 tablet by mouth 2 (two) times daily.   carbidopa-levodopa 25-100 MG tablet Commonly known as:  SINEMET IR Take 0.5 tablets 5 (five) times daily by mouth.   donepezil 10 MG tablet Commonly known as:  ARICEPT Take 10 mg by mouth at bedtime.   fluticasone 50 MCG/ACT nasal spray Commonly known as:  FLONASE Place into the nose.   FREESTYLE LIBRE READER Devi Use 1 each as needed.   FREESTYLE LIBRE SENSOR SYSTEM Misc Use 1 each every 10 (ten) days. Change sensor every 10 days.  Use to test sugar.   glucose blood test strip Commonly known as:  FREESTYLE LITE Use as instructed   insulin aspart 100 UNIT/ML FlexPen Commonly known as:  NOVOLOG Sliding Scale TID with meals up to 30 units   ketorolac 0.5 %  ophthalmic solution Commonly known as:  ACULAR INSTILL 1 DROP INTO RIGHT EYE Daily   LANTUS SOLOSTAR 100 UNIT/ML Solostar Pen Generic drug:  Insulin Glargine Inject 8 Units daily at 10 pm into the skin.   levothyroxine 50 MCG tablet Commonly known as:  SYNTHROID, LEVOTHROID Take 50 mcg by mouth daily before breakfast.   memantine 5 MG tablet Commonly known as:  NAMENDA Take 1 tablet daily by mouth.   mirabegron ER 50 MG Tb24 tablet Commonly known as:  MYRBETRIQ Take 1 tablet (50 mg total) by mouth daily.   omeprazole 40 MG capsule Commonly known as:  PRILOSEC Take 40 mg by mouth daily.   Vitamin D3 2000 units capsule Take by mouth.   vitamin E 400 UNIT capsule Take 400 Units by mouth daily.       Allergies: No Known Allergies  Family History: Family History  Problem Relation Age of Onset  . Bladder Cancer Neg Hx   . Kidney cancer Neg Hx     Social History:  reports that  has never smoked. she has never used smokeless tobacco. She reports that she drinks alcohol. She reports that she does not use drugs.  ROS: UROLOGY Frequent Urination?: No Hard to postpone urination?: No Burning/pain with urination?: No Get up at night to urinate?: No Leakage of urine?: No Urine stream starts and stops?: No Trouble starting stream?: No Do you have to strain to urinate?: No Blood in urine?: No Urinary tract infection?: No Sexually transmitted disease?: No Injury to kidneys or bladder?: No Painful intercourse?: No Weak stream?: No Currently pregnant?: No Vaginal bleeding?: No  Gastrointestinal Nausea?: No Vomiting?: No Indigestion/heartburn?: No Diarrhea?: No Constipation?: No  Constitutional Fever: No Night sweats?: No Weight loss?: Yes Fatigue?: Yes  Skin Skin rash/lesions?: No Itching?: No  Eyes Blurred vision?: Yes Double vision?: No  Ears/Nose/Throat Sore throat?: No Sinus problems?: Yes  Hematologic/Lymphatic Swollen glands?: No Easy  bruising?: Yes  Cardiovascular Leg swelling?:  No Chest pain?: No  Respiratory Cough?: No Shortness of breath?: Yes  Endocrine Excessive thirst?: Yes  Musculoskeletal Back pain?: No Joint pain?: No  Neurological Headaches?: No Dizziness?: Yes  Psychologic Depression?: No Anxiety?: No   Physical Exam: BP 127/69   Pulse 92   Ht 5\' 3"  (1.6 m)   Wt 99 lb 12.8 oz (45.3 kg)   BMI 17.68 kg/m   Constitutional: Well nourished. Alert and oriented, No acute distress. HEENT: [place] AT, moist mucus membranes. Trachea midline, no masses. Cardiovascular: No clubbing, cyanosis, or edema. Respiratory: Normal respiratory effort, no increased work of breathing. Skin: No rashes, bruises or suspicious lesions. Lymph: No cervical or inguinal adenopathy. Neurologic: Grossly intact, no focal deficits, moving all 4 extremities. Psychiatric: Normal mood and affect.  PTNS treatment: The needle electrode was inserted into the lower, inner aspect of the patient's left leg. The surface electrode was placed on the inside arch of the foot on the treatment leg. The lead set was connected to the stimulator and the needle electrode clip was connected to the needle electrode. The stimulator that produces an adjustable electrical pulse that travels to the sacral nerve plexus via the tibial nerve was increased to 10 until the patient received both a sensory response and a toe flex.   Assessment & Plan:    1. Frequency  Treatment Plan:  The needle electrode was removed without difficulty to the patient.  Patient tolerated the procedure for 30 minutes.  She will return next week for # 5 out of 12 of their weekly PTNS treatment's  2. Nocturia  - see above  Return in about 1 week (around 04/21/2017) for # 5 PTNS.  These notes generated with voice recognition software. I apologize for typographical errors.  Zara Council, [facility] [address] [place], [place] [address] [phone]

## 2017-04-14 ENCOUNTER — Encounter: Payer: Self-pay | Admitting: Urology

## 2017-04-14 ENCOUNTER — Ambulatory Visit: Payer: Medicare HMO | Admitting: Urology

## 2017-04-14 VITALS — BP 127/69 | HR 92 | Ht 63.0 in | Wt 99.8 lb

## 2017-04-14 DIAGNOSIS — R350 Frequency of micturition: Secondary | ICD-10-CM

## 2017-04-14 DIAGNOSIS — R351 Nocturia: Secondary | ICD-10-CM

## 2017-04-14 NOTE — Progress Notes (Signed)
PTNS  Session # 4  Health & Social Factors: Changes Caffeine: 1 Alcohol: 0 Daytime voids #per day: 4-5 Night-time voids #per night: 3 Urgency: Strong Incontinence Episodes #per day: 2 Ankle used: Left Treatment Setting: 10 Feeling/ Response: Sensory  Preformed By: Zara Council, PA  Assistant: Reece Packer, RN

## 2017-04-20 DIAGNOSIS — E119 Type 2 diabetes mellitus without complications: Secondary | ICD-10-CM | POA: Diagnosis not present

## 2017-04-20 DIAGNOSIS — E782 Mixed hyperlipidemia: Secondary | ICD-10-CM | POA: Diagnosis not present

## 2017-04-20 DIAGNOSIS — I10 Essential (primary) hypertension: Secondary | ICD-10-CM | POA: Diagnosis not present

## 2017-04-20 DIAGNOSIS — E1159 Type 2 diabetes mellitus with other circulatory complications: Secondary | ICD-10-CM | POA: Diagnosis not present

## 2017-04-20 NOTE — Progress Notes (Signed)
Chief Complaint:  Chief Complaint  Patient presents with  . PTNS     HPI: Patient is a 81 year old Caucasian female who presents today to begin her 12 weekly series of PTNS.  This will be her 5th of 12 weekly treatments.          Baseline complaints of frequency, urgency and nocturia.  She has been experiencing urgency x 4-7 (stable), frequency x 4-7 (stable), not restricting fluids to avoid visits to the restroom, is engaging in toilet mapping, incontinence x 4-7 (stable) and nocturia x 4-7 (improved).   Her PVR was 18 mL.   She has not seen any gross hematuria.  She denies fevers, chills, nausea or vomiting.  She is not having dysuria.     She did not find the Vesicare or Myrbetriq effective.     Previous Therapy:           Pelvic Floor Exercises           Biofeedback           Bladder Training           Fluid Management           Dietary Restrictions          Contraindications present for PTNS      Pacemaker - NO      Implantable defibrillator - NO      History of abnormal bleeding - NO      History of neuropathies or nerve damage - NO  Discussed with patient possible complications of procedure, such as discomfort, bleeding at insertion/stimulation site, procedure consent signed  Patient goals:     To stop the excessive urination.    Reemphasized that most patient's will see benefit by the 8th week of treatment, but about 10 to 20% of patient may be late responders.  If they happen to be late responders, it is important to continue with the therapy beyond the 12 weekly treatments as many of those patient find benefit.  Today, she is experiencing frequency, urgency, nocturia and leakage of urine.  She denies any dysuria, gross hematuria suprapubic pain. She's not had fevers, chills, nausea or vomiting.  Treatment #    # 1   # 2   # 3   # 4   # 5   # 6  Overall improvement  Day time voids     5    5    5    5     2-3    Night time voids     7    2    2    3    2       Incontinence episodes     3    2    2    2    2     Urgency  Strong None  None Strong Mild      PMH: Past Medical History:  Diagnosis Date  . Asthma   . Collapsed lung   . Diabetes mellitus without complication ([place])    Patient takes Insulin twice a day.   Marland Kitchen GERD (gastroesophageal reflux disease)   . Hyperlipidemia   . Hypertension   . Hypothyroidism   . Parkinson's disease ([place])   . Reactive airway disease     Surgical History: Past Surgical History:  Procedure Laterality Date  . ABDOMINAL HYSTERECTOMY    . CHOLECYSTECTOMY    . lung collapse     broken rib from  fall punctured lung    Home Medications:  Allergies as of 04/21/2017   No Known Allergies     Medication List        Accurate as of 04/21/17  4:16 PM. Always use your most recent med list.          AZILECT 1 MG Tabs tablet Generic drug:  rasagiline Take 1 mg by mouth daily.   BD PEN NEEDLE NANO U/F 32G X 4 MM Misc Generic drug:  Insulin Pen Needle   budesonide-formoterol 80-4.5 MCG/ACT inhaler Commonly known as:  SYMBICORT Inhale 2 puffs into the lungs 2 (two) times daily.   calcium-vitamin D 500-400 MG-UNIT tablet Commonly known as:  OSCAL-500 Take 1 tablet by mouth 2 (two) times daily.   carbidopa-levodopa 25-100 MG tablet Commonly known as:  SINEMET IR Take 0.5 tablets 5 (five) times daily by mouth.   donepezil 10 MG tablet Commonly known as:  ARICEPT Take 10 mg by mouth at bedtime.   fluticasone 50 MCG/ACT nasal spray Commonly known as:  FLONASE Place into the nose.   glucose blood test strip Commonly known as:  FREESTYLE LITE Use as instructed   insulin aspart 100 UNIT/ML FlexPen Commonly known as:  NOVOLOG Sliding Scale TID with meals up to 30 units   ketorolac 0.5 % ophthalmic solution Commonly known as:  ACULAR INSTILL 1 DROP INTO RIGHT EYE Daily   LANTUS SOLOSTAR 100 UNIT/ML Solostar Pen Generic drug:  Insulin Glargine Inject 10 Units daily at 10 pm into the skin.    levothyroxine 50 MCG tablet Commonly known as:  SYNTHROID, LEVOTHROID Take 50 mcg by mouth daily before breakfast.   memantine 5 MG tablet Commonly known as:  NAMENDA Take 1 tablet daily by mouth.   mirabegron ER 50 MG Tb24 tablet Commonly known as:  MYRBETRIQ Take 1 tablet (50 mg total) by mouth daily.   omeprazole 40 MG capsule Commonly known as:  PRILOSEC Take 40 mg by mouth daily.   Vitamin D3 2000 units capsule Take by mouth.   vitamin E 400 UNIT capsule Take 400 Units by mouth daily.       Allergies: No Known Allergies  Family History: Family History  Problem Relation Age of Onset  . Bladder Cancer Neg Hx   . Kidney cancer Neg Hx     Social History:  reports that  has never smoked. she has never used smokeless tobacco. She reports that she drinks alcohol. She reports that she does not use drugs.  ROS: UROLOGY Frequent Urination?: Yes Hard to postpone urination?: Yes Burning/pain with urination?: No Get up at night to urinate?: Yes Leakage of urine?: Yes Urine stream starts and stops?: Yes Trouble starting stream?: No Do you have to strain to urinate?: No Blood in urine?: No Urinary tract infection?: No Sexually transmitted disease?: No Injury to kidneys or bladder?: No Painful intercourse?: No Weak stream?: No Currently pregnant?: No Vaginal bleeding?: No Last menstrual period?: n  Gastrointestinal Nausea?: No Vomiting?: No Indigestion/heartburn?: No Diarrhea?: No Constipation?: No  Constitutional Fever: No Night sweats?: Yes Weight loss?: Yes Fatigue?: No  Skin Skin rash/lesions?: No Itching?: No  Eyes Blurred vision?: Yes Double vision?: No  Ears/Nose/Throat Sore throat?: No Sinus problems?: Yes  Hematologic/Lymphatic Swollen glands?: No Easy bruising?: No  Cardiovascular Leg swelling?: No Chest pain?: No  Respiratory Cough?: No Shortness of breath?: Yes  Endocrine Excessive thirst?: No  Musculoskeletal Back  pain?: No Joint pain?: No  Neurological Headaches?: No Dizziness?: Yes  Psychologic  Depression?: No Anxiety?: No   Physical Exam: BP 112/69   Pulse 83   Ht 5\' 3"  (1.6 m)   Wt 102 lb 8 oz (46.5 kg)   BMI 18.16 kg/m   Constitutional: Well nourished. Alert and oriented, No acute distress. HEENT: [place] AT, moist mucus membranes. Trachea midline, no masses. Cardiovascular: No clubbing, cyanosis, or edema. Respiratory: Normal respiratory effort, no increased work of breathing. Skin: No rashes, bruises or suspicious lesions. Lymph: No cervical or inguinal adenopathy. Neurologic: Grossly intact, no focal deficits, moving all 4 extremities. Psychiatric: Normal mood and affect.  PTNS treatment: The needle electrode was inserted into the lower, inner aspect of the patient's right leg. The surface electrode was placed on the inside arch of the foot on the treatment leg. The lead set was connected to the stimulator and the needle electrode clip was connected to the needle electrode. The stimulator that produces an adjustable electrical pulse that travels to the sacral nerve plexus via the tibial nerve was increased to 4 until the patient received both a sensory response and a toe flex.   Assessment & Plan:    1. Frequency  Treatment Plan:  The needle electrode was removed without difficulty to the patient.  Patient tolerated the procedure for 30 minutes.  She will return next week for # 6 out of 12 of their weekly PTNS treatment's  2. Nocturia  - see above  Return in about 1 week (around 04/28/2017) for # 6 PTNS.  These notes generated with voice recognition software. I apologize for typographical errors.  Zara Council, [facility] [address], [place], [place] [address] [phone]

## 2017-04-21 ENCOUNTER — Ambulatory Visit: Payer: Medicare HMO | Admitting: Urology

## 2017-04-21 ENCOUNTER — Encounter: Payer: Self-pay | Admitting: Urology

## 2017-04-21 VITALS — BP 112/69 | HR 83 | Ht 63.0 in | Wt 102.5 lb

## 2017-04-21 DIAGNOSIS — R350 Frequency of micturition: Secondary | ICD-10-CM

## 2017-04-21 DIAGNOSIS — R351 Nocturia: Secondary | ICD-10-CM

## 2017-04-21 NOTE — Progress Notes (Signed)
PTNS  Session # 5  Health & Social Factors: No Change Caffeine: 1 Alcohol: 0 Daytime voids #per day: 2-3 Night-time voids #per night: 2 Urgency: Mild Incontinence Episodes #per day: 2 Ankle used: Right Treatment Setting: 4 Feeling/ Response: Sensory and Toe Flex  Preformed By: Zara Council, PA  Assistant: Reece Packer, RN

## 2017-04-27 NOTE — Progress Notes (Signed)
Chief Complaint:  Chief Complaint  Patient presents with  . PTNS     HPI: Patient is a 81 year old Caucasian female who presents today to begin her 12 weekly series of PTNS.  This will be her 6th of 12 weekly treatments.          Baseline complaints of frequency, urgency and nocturia.  She has been experiencing urgency x 4-7 (stable), frequency x 4-7 (stable), not restricting fluids to avoid visits to the restroom, is engaging in toilet mapping, incontinence x 4-7 (stable) and nocturia x 4-7 (improved).   Her PVR was 18 mL.   She has not seen any gross hematuria.  She denies fevers, chills, nausea or vomiting.  She is not having dysuria.     She did not find the Vesicare or Myrbetriq effective.     Previous Therapy:           Pelvic Floor Exercises           Biofeedback           Bladder Training           Fluid Management           Dietary Restrictions          Contraindications present for PTNS      Pacemaker - NO      Implantable defibrillator - NO      History of abnormal bleeding - NO      History of neuropathies or nerve damage - NO  Discussed with patient possible complications of procedure, such as discomfort, bleeding at insertion/stimulation site, procedure consent signed  Patient goals:     To stop the excessive urination.    Reemphasized that most patient's will see benefit by the 8th week of treatment, but about 10 to 20% of patient may be late responders.  If they happen to be late responders, it is important to continue with the therapy beyond the 12 weekly treatments as many of those patient find benefit.  Today, she is experiencing frequency, urgency, nocturia and leakage of urine.  She denies any dysuria, gross hematuria suprapubic pain. She's not had fevers, chills, nausea or vomiting.   Treatment #  Baseline   # 1   # 2   # 3   # 4   # 5   # 6  Overall improvement  Day time voids   4-7     5    5    5    5     2-3   1-2  %150  Night time voids   4-7      7    2    2    3    2   1   %150  Incontinence episodes   4-7     3    2    2    2    2    1-2  %150  Urgency  Strong Strong None  None Strong Mild  Strong % 28    Treatment # Baseline  #7  #8  #9  #10  #11 #12 Overall improvement  Day time voids  4-7         Night time voids  4-7         Incontinence episodes  4-7         Urgency Strong           PMH: Past Medical History:  Diagnosis Date  .  Asthma   . Collapsed lung   . Diabetes mellitus without complication ([place])    Patient takes Insulin twice a day.   Marland Kitchen GERD (gastroesophageal reflux disease)   . Hyperlipidemia   . Hypertension   . Hypothyroidism   . Parkinson's disease ([place])   . Reactive airway disease     Surgical History: Past Surgical History:  Procedure Laterality Date  . ABDOMINAL HYSTERECTOMY    . CHOLECYSTECTOMY    . COLONOSCOPY WITH PROPOFOL N/A 03/29/2015   Procedure: COLONOSCOPY WITH PROPOFOL;  Surgeon: Hulen Luster, MD;  [facility] ENDOSCOPY;  Service: Gastroenterology;  Laterality: N/A;  . ESOPHAGOGASTRODUODENOSCOPY (EGD) WITH PROPOFOL N/A 03/29/2015   Procedure: ESOPHAGOGASTRODUODENOSCOPY (EGD) WITH PROPOFOL;  Surgeon: Hulen Luster, MD;  Location: [facility] ENDOSCOPY;  Service: Gastroenterology;  Laterality: N/A;  . lung collapse     broken rib from fall punctured lung    Home Medications:  Allergies as of 04/28/2017   No Known Allergies     Medication List        Accurate as of 04/28/17  2:27 PM. Always use your most recent med list.          AZILECT 1 MG Tabs tablet Generic drug:  rasagiline Take 1 mg by mouth daily.   BD PEN NEEDLE NANO U/F 32G X 4 MM Misc Generic drug:  Insulin Pen Needle   budesonide-formoterol 80-4.5 MCG/ACT inhaler Commonly known as:  SYMBICORT Inhale 2 puffs into the lungs 2 (two) times daily.   calcium-vitamin D 500-400 MG-UNIT tablet Commonly known as:  OSCAL-500 Take 1 tablet by mouth 2 (two) times daily.   carbidopa-levodopa 25-100 MG tablet Commonly known as:   SINEMET IR Take 0.5 tablets 5 (five) times daily by mouth.   donepezil 10 MG tablet Commonly known as:  ARICEPT Take 10 mg by mouth at bedtime.   fluticasone 50 MCG/ACT nasal spray Commonly known as:  FLONASE Place into the nose.   glucose blood test strip Commonly known as:  FREESTYLE LITE Use as instructed   insulin aspart 100 UNIT/ML FlexPen Commonly known as:  NOVOLOG Sliding Scale TID with meals up to 30 units   ketorolac 0.5 % ophthalmic solution Commonly known as:  ACULAR INSTILL 1 DROP INTO RIGHT EYE Daily   LANTUS SOLOSTAR 100 UNIT/ML Solostar Pen Generic drug:  Insulin Glargine Inject 10 Units daily at 10 pm into the skin.   levothyroxine 50 MCG tablet Commonly known as:  SYNTHROID, LEVOTHROID Take 50 mcg by mouth daily before breakfast.   memantine 5 MG tablet Commonly known as:  NAMENDA Take 1 tablet daily by mouth.   mirabegron ER 50 MG Tb24 tablet Commonly known as:  MYRBETRIQ Take 1 tablet (50 mg total) by mouth daily.   omeprazole 40 MG capsule Commonly known as:  PRILOSEC Take 40 mg by mouth daily.   Vitamin D3 2000 units capsule Take by mouth.   vitamin E 400 UNIT capsule Take 400 Units by mouth daily.       Allergies: No Known Allergies  Family History: Family History  Problem Relation Age of Onset  . Bladder Cancer Neg Hx   . Kidney cancer Neg Hx     Social History:  reports that  has never smoked. she has never used smokeless tobacco. She reports that she drinks alcohol. She reports that she does not use drugs.  ROS: UROLOGY Frequent Urination?: Yes Hard to postpone urination?: Yes Burning/pain with urination?: No Get up at night to urinate?: Yes  Leakage of urine?: Yes Urine stream starts and stops?: No Trouble starting stream?: No Do you have to strain to urinate?: No Blood in urine?: No Urinary tract infection?: No Sexually transmitted disease?: No Injury to kidneys or bladder?: No Painful intercourse?: No Weak  stream?: No Currently pregnant?: No Vaginal bleeding?: No  Gastrointestinal Nausea?: No Vomiting?: No Indigestion/heartburn?: No Diarrhea?: No Constipation?: No  Constitutional Fever: No Night sweats?: No Weight loss?: Yes Fatigue?: Yes  Skin Skin rash/lesions?: No Itching?: No  Eyes Blurred vision?: Yes Double vision?: No  Ears/Nose/Throat Sore throat?: No Sinus problems?: Yes  Hematologic/Lymphatic Swollen glands?: No Easy bruising?: Yes  Cardiovascular Leg swelling?: No Chest pain?: No  Respiratory Cough?: No Shortness of breath?: Yes  Endocrine Excessive thirst?: Yes  Musculoskeletal Back pain?: No Joint pain?: No  Neurological Headaches?: No Dizziness?: No  Psychologic Depression?: No Anxiety?: No   Physical Exam: BP 104/66   Pulse 74   Ht 5\' 3"  (1.6 m)   Wt 100 lb 9.6 oz (45.6 kg)   BMI 17.82 kg/m   Constitutional: Well nourished. Alert and oriented, No acute distress. HEENT: [place] AT, moist mucus membranes. Trachea midline, no masses. Cardiovascular: No clubbing, cyanosis, or edema. Respiratory: Normal respiratory effort, no increased work of breathing. Skin: No rashes, bruises or suspicious lesions. Lymph: No cervical or inguinal adenopathy. Neurologic: Grossly intact, no focal deficits, moving all 4 extremities. Psychiatric: Normal mood and affect.  PTNS treatment: The needle electrode was inserted into the lower, inner aspect of the patient's left leg. The surface electrode was placed on the inside arch of the foot on the treatment leg. The lead set was connected to the stimulator and the needle electrode clip was connected to the needle electrode. The stimulator that produces an adjustable electrical pulse that travels to the sacral nerve plexus via the tibial nerve was increased to 7 until the patient received sensory response.     Assessment & Plan:    1. Frequency  Treatment Plan:  The needle electrode was removed without  difficulty to the patient.  Patient tolerated the procedure for 30 minutes.  She will return next week for # 8 out of 12 of their weekly PTNS treatment's  2. Nocturia  - see above  Return for # 8/12 PTNS.  These notes generated with voice recognition software. I apologize for typographical errors.  Zara Council, [facility] [address] [place], [place] [address] [phone]

## 2017-04-28 ENCOUNTER — Ambulatory Visit (INDEPENDENT_AMBULATORY_CARE_PROVIDER_SITE_OTHER): Payer: Medicare HMO | Admitting: Urology

## 2017-04-28 ENCOUNTER — Encounter: Payer: Self-pay | Admitting: Urology

## 2017-04-28 VITALS — BP 104/66 | HR 74 | Ht 63.0 in | Wt 100.6 lb

## 2017-04-28 DIAGNOSIS — R351 Nocturia: Secondary | ICD-10-CM | POA: Diagnosis not present

## 2017-04-28 DIAGNOSIS — R350 Frequency of micturition: Secondary | ICD-10-CM

## 2017-04-28 NOTE — Progress Notes (Signed)
PTNS  Session # 6  Health & Social Factors: No Change Caffeine: 1 Alcohol: 0 Daytime voids #per day: 1-2 Night-time voids #per night: 1 Urgency: Strong Incontinence Episodes #per day: 1-2 Ankle used: Left Treatment Setting: 7 Feeling/ Response: Sensory  Preformed By: Zara Council, PAC  Assistant: Reece Packer, RN.

## 2017-05-04 NOTE — Progress Notes (Signed)
Chief Complaint:  Chief Complaint  Patient presents with  . PTNS     HPI: Patient is a 81 year old Caucasian female who presents today to begin her 12 weekly series of PTNS.  This will be her 7th of 12 weekly treatments.          Baseline complaints of frequency, urgency and nocturia.  She has been experiencing urgency x 4-7 (stable), frequency x 4-7 (stable), not restricting fluids to avoid visits to the restroom, is engaging in toilet mapping, incontinence x 4-7 (stable) and nocturia x 4-7 (improved).   Her PVR was 18 mL.   She has not seen any gross hematuria.  She denies fevers, chills, nausea or vomiting.  She is not having dysuria.     She did not find the Vesicare or Myrbetriq effective.     Previous Therapy:           Pelvic Floor Exercises           Biofeedback           Bladder Training           Fluid Management           Dietary Restrictions          Contraindications present for PTNS      Pacemaker - NO      Implantable defibrillator - NO      History of abnormal bleeding - NO      History of neuropathies or nerve damage - NO  Discussed with patient possible complications of procedure, such as discomfort, bleeding at insertion/stimulation site, procedure consent signed  Patient goals:     To stop the excessive urination.    Reemphasized that most patient's will see benefit by the 8th week of treatment, but about 10 to 20% of patient may be late responders.  If they happen to be late responders, it is important to continue with the therapy beyond the 12 weekly treatments as many of those patient find benefit.  Today, she is experiencing frequency, urgency, nocturia and leakage of urine.  She denies any dysuria, gross hematuria suprapubic pain. She's not had fevers, chills, nausea or vomiting.   Treatment #  Baseline   # 1   # 2   # 3   # 4   # 5   # 6  Overall improvement  Day time voids   4-7     5    5    5    5     2-3   1-2  %150  Night time voids   4-7      7    2    2    3    2   1   %150  Incontinence episodes   4-7     3    2    2    2    2    1-2  %150  Urgency  Strong Strong None  None Strong Mild  Strong % 28    Treatment # Baseline  #7  #8  #9  #10  #11 #12 Overall improvement  Day time voids  4-7  4-5        Night time voids  4-7  0-1        Incontinence episodes  4-7  1-2        Urgency Strong  mild           PMH: Past Medical History:  Diagnosis Date  . Asthma   . Collapsed lung   . Diabetes mellitus without complication ([place])    Patient takes Insulin twice a day.   Marland Kitchen GERD (gastroesophageal reflux disease)   . Hyperlipidemia   . Hypertension   . Hypothyroidism   . Parkinson's disease ([place])   . Reactive airway disease     Surgical History: Past Surgical History:  Procedure Laterality Date  . ABDOMINAL HYSTERECTOMY    . CHOLECYSTECTOMY    . COLONOSCOPY WITH PROPOFOL N/A 03/29/2015   Procedure: COLONOSCOPY WITH PROPOFOL;  Surgeon: Hulen Luster, MD;  Location: [facility] ENDOSCOPY;  Service: Gastroenterology;  Laterality: N/A;  . ESOPHAGOGASTRODUODENOSCOPY (EGD) WITH PROPOFOL N/A 03/29/2015   Procedure: ESOPHAGOGASTRODUODENOSCOPY (EGD) WITH PROPOFOL;  Surgeon: Hulen Luster, MD;  Location: [facility] ENDOSCOPY;  Service: Gastroenterology;  Laterality: N/A;  . lung collapse     broken rib from fall punctured lung    Home Medications:  Allergies as of 05/05/2017   No Known Allergies     Medication List        Accurate as of 05/05/17 11:59 PM. Always use your most recent med list.          AZILECT 1 MG Tabs tablet Generic drug:  rasagiline Take 1 mg by mouth daily.   BD PEN NEEDLE NANO U/F 32G X 4 MM Misc Generic drug:  Insulin Pen Needle   budesonide-formoterol 80-4.5 MCG/ACT inhaler Commonly known as:  SYMBICORT Inhale 2 puffs into the lungs 2 (two) times daily.   calcium-vitamin D 500-400 MG-UNIT tablet Commonly known as:  OSCAL-500 Take 1 tablet by mouth 2 (two) times daily.   carbidopa-levodopa 25-100 MG  tablet Commonly known as:  SINEMET IR Take 0.5 tablets 5 (five) times daily by mouth.   donepezil 10 MG tablet Commonly known as:  ARICEPT Take 10 mg by mouth at bedtime.   fluticasone 50 MCG/ACT nasal spray Commonly known as:  FLONASE Place into the nose.   glucose blood test strip Commonly known as:  FREESTYLE LITE Use as instructed   insulin aspart 100 UNIT/ML FlexPen Commonly known as:  NOVOLOG Sliding Scale TID with meals up to 30 units   ketorolac 0.5 % ophthalmic solution Commonly known as:  ACULAR INSTILL 1 DROP INTO RIGHT EYE Daily   LANTUS SOLOSTAR 100 UNIT/ML Solostar Pen Generic drug:  Insulin Glargine Inject 10 Units daily at 10 pm into the skin.   levothyroxine 50 MCG tablet Commonly known as:  SYNTHROID, LEVOTHROID Take 50 mcg by mouth daily before breakfast.   memantine 5 MG tablet Commonly known as:  NAMENDA Take 1 tablet daily by mouth.   mirabegron ER 50 MG Tb24 tablet Commonly known as:  MYRBETRIQ Take 1 tablet (50 mg total) by mouth daily.   omeprazole 40 MG capsule Commonly known as:  PRILOSEC Take 40 mg by mouth daily.   Vitamin D3 2000 units capsule Take by mouth.   vitamin E 400 UNIT capsule Take 400 Units by mouth daily.       Allergies: No Known Allergies  Family History: Family History  Problem Relation Age of Onset  . Bladder Cancer Neg Hx   . Kidney cancer Neg Hx     Social History:  reports that  has never smoked. she has never used smokeless tobacco. She reports that she drinks alcohol. She reports that she does not use drugs.  ROS: UROLOGY Frequent Urination?: Yes Hard to postpone urination?: Yes Burning/pain with urination?: No Get up at night  to urinate?: Yes Leakage of urine?: Yes Urine stream starts and stops?: No Trouble starting stream?: No Do you have to strain to urinate?: No Blood in urine?: No Urinary tract infection?: No Sexually transmitted disease?: No Injury to kidneys or bladder?: No Painful  intercourse?: No Weak stream?: No Currently pregnant?: No Vaginal bleeding?: No Last menstrual period?: n  Gastrointestinal Nausea?: No Vomiting?: No Indigestion/heartburn?: No Diarrhea?: No Constipation?: No  Constitutional Fever: No Night sweats?: No Weight loss?: Yes Fatigue?: Yes  Skin Skin rash/lesions?: No Itching?: No  Eyes Blurred vision?: Yes Double vision?: No  Ears/Nose/Throat Sore throat?: No Sinus problems?: Yes  Hematologic/Lymphatic Swollen glands?: No Easy bruising?: Yes  Cardiovascular Leg swelling?: No Chest pain?: No  Respiratory Cough?: No Shortness of breath?: Yes  Endocrine Excessive thirst?: Yes  Musculoskeletal Back pain?: No Joint pain?: No  Neurological Headaches?: No Dizziness?: No  Psychologic Depression?: No Anxiety?: No   Physical Exam: BP 135/85   Pulse 89   Ht 5\' 3"  (1.6 m)   Wt 101 lb 4.8 oz (45.9 kg)   BMI 17.94 kg/m   Constitutional: Well nourished. Alert and oriented, No acute distress. HEENT: [place] AT, moist mucus membranes. Trachea midline, no masses. Cardiovascular: No clubbing, cyanosis, or edema. Respiratory: Normal respiratory effort, no increased work of breathing. Skin: No rashes, bruises or suspicious lesions. Lymph: No cervical or inguinal adenopathy. Neurologic: Grossly intact, no focal deficits, moving all 4 extremities. Psychiatric: Normal mood and affect.  PTNS treatment: The needle electrode was inserted into the lower, inner aspect of the patient's right leg. The surface electrode was placed on the inside arch of the foot on the treatment leg. The lead set was connected to the stimulator and the needle electrode clip was connected to the needle electrode. The stimulator that produces an adjustable electrical pulse that travels to the sacral nerve plexus via the tibial nerve was increased to 1 until the patient received a sensory response.     Assessment & Plan:    1.  Frequency  Treatment Plan:  The needle electrode was removed without difficulty to the patient.  Patient tolerated the procedure for 30 minutes.  She will return next week for # 8 out of 12 of their weekly PTNS treatment's  2. Nocturia  - see above  Return in about 1 week (around 05/12/2017) for # 8 PTNS.  These notes generated with voice recognition software. I apologize for typographical errors.  Zara Council, [facility] [address] [place],  [address] [phone]

## 2017-05-05 ENCOUNTER — Encounter: Payer: Self-pay | Admitting: Urology

## 2017-05-05 ENCOUNTER — Ambulatory Visit: Payer: Medicare HMO | Admitting: Urology

## 2017-05-05 VITALS — BP 135/85 | HR 89 | Ht 63.0 in | Wt 101.3 lb

## 2017-05-05 DIAGNOSIS — R350 Frequency of micturition: Secondary | ICD-10-CM

## 2017-05-05 DIAGNOSIS — R351 Nocturia: Secondary | ICD-10-CM | POA: Diagnosis not present

## 2017-05-05 NOTE — Progress Notes (Signed)
PTNS  Session # 7  Health & Social Factors: No Change Caffeine: 0 Alcohol: 0 Daytime voids #per day: 4-5 Night-time voids #per night: 0-1 Urgency: Mild Incontinence Episodes #per day: 1-2 Ankle used: Right Treatment Setting: 1 Feeling/ Response: Sensory  Preformed By: Zara Council, PAC  Assistant: Reece Packer, RN

## 2017-05-11 NOTE — Progress Notes (Signed)
Chief Complaint:  Chief Complaint  Patient presents with  . PTNS     HPI: Patient is a 81 year old Caucasian female who presents today to begin her 12 weekly series of PTNS.  This will be her 8th of 12 weekly treatments.          Baseline complaints of frequency, urgency and nocturia.  She has been experiencing urgency x 4-7 (stable), frequency x 4-7 (stable), not restricting fluids to avoid visits to the restroom, is engaging in toilet mapping, incontinence x 4-7 (stable) and nocturia x 4-7 (improved).   Her PVR was 18 mL.   She has not seen any gross hematuria.  She denies fevers, chills, nausea or vomiting.  She is not having dysuria.     She did not find the Vesicare or Myrbetriq effective.     Previous Therapy:           Pelvic Floor Exercises           Biofeedback           Bladder Training           Fluid Management           Dietary Restrictions          Contraindications present for PTNS      Pacemaker - NO      Implantable defibrillator - NO      History of abnormal bleeding - NO      History of neuropathies or nerve damage - NO  Discussed with patient possible complications of procedure, such as discomfort, bleeding at insertion/stimulation site, procedure consent signed  Patient goals:     To stop the excessive urination.    Reemphasized that most patient's will see benefit by the 8th week of treatment, but about 10 to 20% of patient may be late responders.  If they happen to be late responders, it is important to continue with the therapy beyond the 12 weekly treatments as many of those patient find benefit.  Today, she is experiencing frequency and nocturia.  She denies any dysuria, gross hematuria suprapubic pain. She's not had fevers, chills, nausea or vomiting.   Treatment #  Baseline   # 1   # 2   # 3   # 4   # 5   # 6  Overall improvement  Day time voids   4-7     5    5    5    5     2-3   1-2  %150  Night time voids   4-7     7    2    2    3    2   1    %150  Incontinence episodes   4-7     3    2    2    2    2    1-2  %150  Urgency  Strong Strong None  None Strong Mild  Strong % 28    Treatment # Baseline  #7  #8  #9  #10  #11 #12 Overall improvement  Day time voids  4-7  4-5   3-4       Night time voids  4-7  0-1   1-2       Incontinence episodes  4-7  1-2   0       Urgency Strong  mild   mild         PMH:  Past Medical History:  Diagnosis Date  . Asthma   . Collapsed lung   . Diabetes mellitus without complication ([place])    Patient takes Insulin twice a day.   Marland Kitchen GERD (gastroesophageal reflux disease)   . Hyperlipidemia   . Hypertension   . Hypothyroidism   . Parkinson's disease ([place])   . Reactive airway disease     Surgical History: Past Surgical History:  Procedure Laterality Date  . ABDOMINAL HYSTERECTOMY    . CHOLECYSTECTOMY    . COLONOSCOPY WITH PROPOFOL N/A 03/29/2015   Procedure: COLONOSCOPY WITH PROPOFOL;  Surgeon: Hulen Luster, MD;  Location: [facility] ENDOSCOPY;  Service: Gastroenterology;  Laterality: N/A;  . ESOPHAGOGASTRODUODENOSCOPY (EGD) WITH PROPOFOL N/A 03/29/2015   Procedure: ESOPHAGOGASTRODUODENOSCOPY (EGD) WITH PROPOFOL;  Surgeon: Hulen Luster, MD;  Location: [facility] ENDOSCOPY;  Service: Gastroenterology;  Laterality: N/A;  . lung collapse     broken rib from fall punctured lung    Home Medications:  Allergies as of 05/12/2017   No Known Allergies     Medication List        Accurate as of 05/12/17  1:55 PM. Always use your most recent med list.          AZILECT 1 MG Tabs tablet Generic drug:  rasagiline Take 1 mg by mouth daily.   BD PEN NEEDLE NANO U/F 32G X 4 MM Misc Generic drug:  Insulin Pen Needle   budesonide-formoterol 80-4.5 MCG/ACT inhaler Commonly known as:  SYMBICORT Inhale 2 puffs into the lungs 2 (two) times daily.   calcium-vitamin D 500-400 MG-UNIT tablet Commonly known as:  OSCAL-500 Take 1 tablet by mouth 2 (two) times daily.   carbidopa-levodopa 25-100 MG tablet Commonly  known as:  SINEMET IR Take 0.5 tablets 5 (five) times daily by mouth.   donepezil 10 MG tablet Commonly known as:  ARICEPT Take 10 mg by mouth at bedtime.   fluticasone 50 MCG/ACT nasal spray Commonly known as:  FLONASE Place into the nose.   glucose blood test strip Commonly known as:  FREESTYLE LITE Use as instructed   HYDROcodone-acetaminophen 5-325 MG tablet Commonly known as:  NORCO/VICODIN Take 1 tablet by mouth every 6 (six) hours as needed for moderate pain.   ibuprofen 400 MG tablet Commonly known as:  ADVIL,MOTRIN Take 400 mg by mouth every 6 (six) hours as needed.   insulin aspart 100 UNIT/ML FlexPen Commonly known as:  NOVOLOG Sliding Scale TID with meals up to 30 units   ketorolac 0.5 % ophthalmic solution Commonly known as:  ACULAR INSTILL 1 DROP INTO RIGHT EYE Daily   LANTUS SOLOSTAR 100 UNIT/ML Solostar Pen Generic drug:  Insulin Glargine Inject 10 Units daily at 10 pm into the skin.   levothyroxine 50 MCG tablet Commonly known as:  SYNTHROID, LEVOTHROID Take 50 mcg by mouth daily before breakfast.   memantine 5 MG tablet Commonly known as:  NAMENDA Take 1 tablet daily by mouth.   mirabegron ER 50 MG Tb24 tablet Commonly known as:  MYRBETRIQ Take 1 tablet (50 mg total) by mouth daily.   omeprazole 40 MG capsule Commonly known as:  PRILOSEC Take 40 mg by mouth daily.   Vitamin D3 2000 units capsule Take by mouth.   vitamin E 400 UNIT capsule Take 400 Units by mouth daily.       Allergies: No Known Allergies  Family History: Family History  Problem Relation Age of Onset  . Bladder Cancer Neg Hx   . Kidney cancer Neg Hx  Social History:  reports that  has never smoked. she has never used smokeless tobacco. She reports that she drinks alcohol. She reports that she does not use drugs.  ROS: UROLOGY Frequent Urination?: Yes Hard to postpone urination?: No Burning/pain with urination?: No Get up at night to urinate?: Yes Leakage  of urine?: No Urine stream starts and stops?: No Trouble starting stream?: No Do you have to strain to urinate?: No Blood in urine?: No Urinary tract infection?: No Sexually transmitted disease?: No Injury to kidneys or bladder?: No Painful intercourse?: No Weak stream?: No Currently pregnant?: No Vaginal bleeding?: No Last menstrual period?: n  Gastrointestinal Nausea?: No Vomiting?: No Indigestion/heartburn?: No Diarrhea?: No Constipation?: No  Constitutional Fever: No Night sweats?: No Weight loss?: Yes Fatigue?: Yes  Skin Skin rash/lesions?: No Itching?: No  Eyes Blurred vision?: No Double vision?: No  Ears/Nose/Throat Sore throat?: Yes Sinus problems?: Yes  Hematologic/Lymphatic Swollen glands?: No Easy bruising?: Yes  Cardiovascular Leg swelling?: No Chest pain?: No  Respiratory Cough?: No Shortness of breath?: Yes  Endocrine Excessive thirst?: No  Musculoskeletal Back pain?: No Joint pain?: No  Neurological Headaches?: No Dizziness?: No  Psychologic Depression?: No Anxiety?: No   Physical Exam: BP 129/77   Pulse 80   Ht 5\' 3"  (1.6 m)   Wt 101 lb 8 oz (46 kg)   BMI 17.98 kg/m   Constitutional: Well nourished. Alert and oriented, No acute distress. HEENT: [place] AT, moist mucus membranes. Trachea midline, no masses. Cardiovascular: No clubbing, cyanosis, or edema. Respiratory: Normal respiratory effort, no increased work of breathing. Skin: No rashes, bruises or suspicious lesions. Lymph: No cervical or inguinal adenopathy. Neurologic: Grossly intact, no focal deficits, moving all 4 extremities. Psychiatric: Normal mood and affect.  PTNS treatment: The needle electrode was inserted into the lower, inner aspect of the patient's left eg. The surface electrode was placed on the inside arch of the foot on the treatment leg. The lead set was connected to the stimulator and the needle electrode clip was connected to the needle  electrode. The stimulator that produces an adjustable electrical pulse that travels to the sacral nerve plexus via the tibial nerve was increased to 4 until the patient received a toe flex and a sensory response.     Assessment & Plan:    1. Frequency  Treatment Plan:  The needle electrode was removed without difficulty to the patient.  Patient tolerated the procedure for 30 minutes.  She will return next week for # 9 out of 12 of their weekly PTNS treatment's  2. Nocturia  - see above  Return in about 1 week (around 05/19/2017) for # 9 PTNS.  These notes generated with voice recognition software. I apologize for typographical errors.  Zara Council, [facility] [address] [place], [place] [address] [phone]

## 2017-05-12 ENCOUNTER — Ambulatory Visit: Payer: Medicare HMO | Admitting: Urology

## 2017-05-12 ENCOUNTER — Encounter: Payer: Self-pay | Admitting: Urology

## 2017-05-12 VITALS — BP 129/77 | HR 80 | Ht 63.0 in | Wt 101.5 lb

## 2017-05-12 DIAGNOSIS — R351 Nocturia: Secondary | ICD-10-CM

## 2017-05-12 DIAGNOSIS — R350 Frequency of micturition: Secondary | ICD-10-CM

## 2017-05-12 NOTE — Progress Notes (Signed)
PTNS  Session # 8  Health & Social Factors: Dental Surgery on Monday 12/3 Caffeine: 0 Alcohol: 0 Daytime voids #per day: 3-4 Night-time voids #per night: 1-2 Urgency: Mild Incontinence Episodes #per day: 0 Ankle used: Left Treatment Setting: 4 Feeling/ Response: Sensory and Toe Flex  Performed By: Zara Council, PAC  Assistant: Reece Packer, RN

## 2017-05-18 NOTE — Progress Notes (Signed)
Chief Complaint:  Chief Complaint  Patient presents with  . PTNS     HPI: Patient is a 81 year old Caucasian female who presents today to begin her 12 weekly series of PTNS.  This will be her 9th of 12 weekly treatments.          Baseline complaints of frequency, urgency and nocturia.  She has been experiencing urgency x 4-7 (stable), frequency x 4-7 (stable), not restricting fluids to avoid visits to the restroom, is engaging in toilet mapping, incontinence x 4-7 (stable) and nocturia x 4-7 (improved).   Her PVR was 18 mL.   She has not seen any gross hematuria.  She denies fevers, chills, nausea or vomiting.  She is not having dysuria.     She did not find the Vesicare or Myrbetriq effective.     Previous Therapy:           Pelvic Floor Exercises           Biofeedback           Bladder Training           Fluid Management           Dietary Restrictions          Contraindications present for PTNS      Pacemaker - NO      Implantable defibrillator - NO      History of abnormal bleeding - NO      History of neuropathies or nerve damage - NO  Discussed with patient possible complications of procedure, such as discomfort, bleeding at insertion/stimulation site, procedure consent signed  Patient goals:     To stop the excessive urination.    Reemphasized that most patient's will see benefit by the 8th week of treatment, but about 10 to 20% of patient may be late responders.  If they happen to be late responders, it is important to continue with the therapy beyond the 12 weekly treatments as many of those patient find benefit.  Today, she is experiencing frequency and nocturia.  She denies any dysuria, gross hematuria suprapubic pain. She's not had fevers, chills, nausea or vomiting.   Treatment #  Baseline   # 1   # 2   # 3   # 4   # 5   # 6  Overall improvement  Day time voids   4-7     5    5    5    5     2-3   1-2  %150  Night time voids   4-7     7    2    2    3    2   1    %150  Incontinence episodes   4-7     3    2    2    2    2    1-2  %150  Urgency  Strong Strong None  None Strong Mild  Strong % 28    Treatment # Baseline  #7  #8  #9  #10  #11 #12 Overall improvement  Day time voids  4-7  4-5   3-4   4      Night time voids  4-7  0-1   1-2   1      Incontinence episodes  4-7  1-2   0   1      Urgency Strong  mild   mild  mild  PMH: Past Medical History:  Diagnosis Date  . Asthma   . Collapsed lung   . Diabetes mellitus without complication ([place])    Patient takes Insulin twice a day.   Marland Kitchen GERD (gastroesophageal reflux disease)   . Hyperlipidemia   . Hypertension   . Hypothyroidism   . Parkinson's disease ([place])   . Reactive airway disease     Surgical History: Past Surgical History:  Procedure Laterality Date  . ABDOMINAL HYSTERECTOMY    . CHOLECYSTECTOMY    . COLONOSCOPY WITH PROPOFOL N/A 03/29/2015   Procedure: COLONOSCOPY WITH PROPOFOL;  Surgeon: Hulen Luster, MD;  Location: [facility] ENDOSCOPY;  Service: Gastroenterology;  Laterality: N/A;  . ESOPHAGOGASTRODUODENOSCOPY (EGD) WITH PROPOFOL N/A 03/29/2015   Procedure: ESOPHAGOGASTRODUODENOSCOPY (EGD) WITH PROPOFOL;  Surgeon: Hulen Luster, MD;  Location: [facility] ENDOSCOPY;  Service: Gastroenterology;  Laterality: N/A;  . lung collapse     broken rib from fall punctured lung    Home Medications:  Allergies as of 05/19/2017   No Known Allergies     Medication List        Accurate as of 05/19/17  2:11 PM. Always use your most recent med list.          AZILECT 1 MG Tabs tablet Generic drug:  rasagiline Take 1 mg by mouth daily.   BD PEN NEEDLE NANO U/F 32G X 4 MM Misc Generic drug:  Insulin Pen Needle   budesonide-formoterol 80-4.5 MCG/ACT inhaler Commonly known as:  SYMBICORT Inhale 2 puffs into the lungs 2 (two) times daily.   calcium-vitamin D 500-400 MG-UNIT tablet Commonly known as:  OSCAL-500 Take 1 tablet by mouth 2 (two) times daily.   carbidopa-levodopa 25-100 MG  tablet Commonly known as:  SINEMET IR Take 0.5 tablets 5 (five) times daily by mouth.   donepezil 10 MG tablet Commonly known as:  ARICEPT Take 10 mg by mouth at bedtime.   fluticasone 50 MCG/ACT nasal spray Commonly known as:  FLONASE Place into the nose.   glucose blood test strip Commonly known as:  FREESTYLE LITE Use as instructed   HYDROcodone-acetaminophen 5-325 MG tablet Commonly known as:  NORCO/VICODIN Take 1 tablet by mouth every 6 (six) hours as needed for moderate pain.   ibuprofen 400 MG tablet Commonly known as:  ADVIL,MOTRIN Take 400 mg by mouth every 6 (six) hours as needed.   insulin aspart 100 UNIT/ML FlexPen Commonly known as:  NOVOLOG Sliding Scale TID with meals up to 30 units   ketorolac 0.5 % ophthalmic solution Commonly known as:  ACULAR INSTILL 1 DROP INTO RIGHT EYE Daily   LANTUS SOLOSTAR 100 UNIT/ML Solostar Pen Generic drug:  Insulin Glargine Inject 10 Units daily at 10 pm into the skin.   levothyroxine 50 MCG tablet Commonly known as:  SYNTHROID, LEVOTHROID Take 50 mcg by mouth daily before breakfast.   memantine 5 MG tablet Commonly known as:  NAMENDA Take 1 tablet daily by mouth.   mirabegron ER 50 MG Tb24 tablet Commonly known as:  MYRBETRIQ Take 1 tablet (50 mg total) by mouth daily.   omeprazole 40 MG capsule Commonly known as:  PRILOSEC Take 40 mg by mouth daily.   Vitamin D3 2000 units capsule Take by mouth.   vitamin E 400 UNIT capsule Take 400 Units by mouth daily.       Allergies: No Known Allergies  Family History: Family History  Problem Relation Age of Onset  . Bladder Cancer Neg Hx   . Kidney cancer Neg  Hx     Social History:  reports that  has never smoked. she has never used smokeless tobacco. She reports that she drinks alcohol. She reports that she does not use drugs.  ROS: UROLOGY Frequent Urination?: Yes Hard to postpone urination?: No Burning/pain with urination?: No Get up at night to  urinate?: Yes Leakage of urine?: No Urine stream starts and stops?: No Trouble starting stream?: No Do you have to strain to urinate?: No Blood in urine?: No Urinary tract infection?: No Sexually transmitted disease?: No Injury to kidneys or bladder?: No Painful intercourse?: No Weak stream?: No Currently pregnant?: No Vaginal bleeding?: No Last menstrual period?: n  Gastrointestinal Nausea?: No Vomiting?: No Indigestion/heartburn?: No Diarrhea?: No Constipation?: No  Constitutional Fever: No Night sweats?: No Weight loss?: Yes Fatigue?: Yes  Skin Skin rash/lesions?: No Itching?: No  Eyes Blurred vision?: Yes Double vision?: No  Ears/Nose/Throat Sore throat?: No Sinus problems?: Yes  Hematologic/Lymphatic Swollen glands?: No Easy bruising?: No  Cardiovascular Leg swelling?: No Chest pain?: No  Respiratory Cough?: No Shortness of breath?: Yes  Endocrine Excessive thirst?: Yes  Musculoskeletal Back pain?: No Joint pain?: No  Neurological Headaches?: No Dizziness?: Yes  Psychologic Depression?: No Anxiety?: No   Physical Exam: BP 138/85 (BP Location: Right Arm, Patient Position: Sitting, Cuff Size: Normal)   Pulse 80   Ht 5\' 3"  (1.6 m)   Wt 101 lb (45.8 kg)   BMI 17.89 kg/m   Constitutional: Well nourished. Alert and oriented, No acute distress. HEENT: [place] AT, moist mucus membranes. Trachea midline, no masses. Cardiovascular: No clubbing, cyanosis, or edema. Respiratory: Normal respiratory effort, no increased work of breathing. Skin: No rashes, bruises or suspicious lesions. Lymph: No cervical or inguinal adenopathy. Neurologic: Grossly intact, no focal deficits, moving all 4 extremities. Psychiatric: Normal mood and affect.  PTNS treatment: The needle electrode was inserted into the lower, inner aspect of the patient's right leg. The surface electrode was placed on the inside arch of the foot on the treatment leg. The lead set was  connected to the stimulator and the needle electrode clip was connected to the needle electrode. The stimulator that produces an adjustable electrical pulse that travels to the sacral nerve plexus via the tibial nerve was increased to 4 until the patient received a sensory response.     Assessment & Plan:    1. Frequency  Treatment Plan:  The needle electrode was removed without difficulty to the patient.  Patient tolerated the procedure for 30 minutes.  She will return next week for # 10 out of 12 of their weekly PTNS treatment's  2. Nocturia  - see above  Return for # 10 PTNS.  These notes generated with voice recognition software. I apologize for typographical errors.  Zara Council, [facility] [address], [place], [place] [address] [phone]

## 2017-05-19 ENCOUNTER — Encounter: Payer: Self-pay | Admitting: Urology

## 2017-05-19 ENCOUNTER — Ambulatory Visit: Payer: Medicare HMO | Admitting: Urology

## 2017-05-19 VITALS — BP 138/85 | HR 80 | Ht 63.0 in | Wt 101.0 lb

## 2017-05-19 DIAGNOSIS — R350 Frequency of micturition: Secondary | ICD-10-CM

## 2017-05-19 DIAGNOSIS — R351 Nocturia: Secondary | ICD-10-CM | POA: Diagnosis not present

## 2017-05-19 NOTE — Progress Notes (Signed)
PTNS  Session # 9  Health & Social Factors: no change Caffeine: 0 Alcohol: 0 Daytime voids #per day: 4 Night-time voids #per night: 1 Urgency:mild Incontinence Episodes #per day: urgency Ankle used: right Treatment Setting: 4 Feeling/ Response: sensory Comments: patient tolerated well  Preformed By: Zara Council, PA-C  Assistant: Elberta Leatherwood, CMA  Follow Up: 1 week

## 2017-05-25 NOTE — Progress Notes (Signed)
Chief Complaint:  Chief Complaint  Patient presents with  . PTNS     HPI: Patient is a 81 year old Caucasian female who presents today to begin her 12 weekly series of PTNS.  This will be her 10th of 12 weekly treatments.          Baseline complaints of frequency, urgency and nocturia.  She has been experiencing urgency x 4-7 (stable), frequency x 4-7 (stable), not restricting fluids to avoid visits to the restroom, is engaging in toilet mapping, incontinence x 4-7 (stable) and nocturia x 4-7 (improved).   Her PVR was 18 mL.   She has not seen any gross hematuria.  She denies fevers, chills, nausea or vomiting.  She is not having dysuria.     She did not find the Vesicare or Myrbetriq effective.     Previous Therapy:           Pelvic Floor Exercises           Biofeedback           Bladder Training           Fluid Management           Dietary Restrictions          Contraindications present for PTNS      Pacemaker - NO      Implantable defibrillator - NO      History of abnormal bleeding - NO      History of neuropathies or nerve damage - NO  Discussed with patient possible complications of procedure, such as discomfort, bleeding at insertion/stimulation site, procedure consent signed  Patient goals:     To stop the excessive urination.    Reemphasized that most patient's will see benefit by the 8th week of treatment, but about 10 to 20% of patient may be late responders.  If they happen to be late responders, it is important to continue with the therapy beyond the 12 weekly treatments as many of those patient find benefit.  Today, she is experiencing frequency and nocturia.  She denies any dysuria, gross hematuria suprapubic pain. She's not had fevers, chills, nausea or vomiting.   Treatment #  Baseline   # 1   # 2   # 3   # 4   # 5   # 6  Overall improvement  Day time voids   4-7     5    5    5    5     2-3   1-2  %150  Night time voids   4-7     7    2    2    3    2    1   %150  Incontinence episodes   4-7     3    2    2    2    2    1-2  %150  Urgency  Strong Strong None  None Strong Mild  Strong % 28    Treatment # Baseline  #7  #8  #9  #10  #11 #12 Overall improvement  Day time voids  4-7  4-5   3-4   4   3      Night time voids  4-7  0-1   1-2   1   1-2     Incontinence episodes  4-7  1-2   0   1   0     Urgency Strong  mild  mild  mild  mild       PMH: Past Medical History:  Diagnosis Date  . Asthma   . Collapsed lung   . Diabetes mellitus without complication ([place])    Patient takes Insulin twice a day.   Marland Kitchen GERD (gastroesophageal reflux disease)   . Hyperlipidemia   . Hypertension   . Hypothyroidism   . Parkinson's disease ([place])   . Reactive airway disease     Surgical History: Past Surgical History:  Procedure Laterality Date  . ABDOMINAL HYSTERECTOMY    . CHOLECYSTECTOMY    . COLONOSCOPY WITH PROPOFOL N/A 03/29/2015   Procedure: COLONOSCOPY WITH PROPOFOL;  Surgeon: Hulen Luster, MD;  Location: [facility] ENDOSCOPY;  Service: Gastroenterology;  Laterality: N/A;  . ESOPHAGOGASTRODUODENOSCOPY (EGD) WITH PROPOFOL N/A 03/29/2015   Procedure: ESOPHAGOGASTRODUODENOSCOPY (EGD) WITH PROPOFOL;  Surgeon: Hulen Luster, MD;  Location: [facility] ENDOSCOPY;  Service: Gastroenterology;  Laterality: N/A;  . lung collapse     broken rib from fall punctured lung    Home Medications:  Allergies as of 05/26/2017   No Known Allergies     Medication List        Accurate as of 05/26/17  2:42 PM. Always use your most recent med list.          AZILECT 1 MG Tabs tablet Generic drug:  rasagiline Take 1 mg by mouth daily.   BD PEN NEEDLE NANO U/F 32G X 4 MM Misc Generic drug:  Insulin Pen Needle   budesonide-formoterol 80-4.5 MCG/ACT inhaler Commonly known as:  SYMBICORT Inhale 2 puffs into the lungs 2 (two) times daily.   calcium-vitamin D 500-400 MG-UNIT tablet Commonly known as:  OSCAL-500 Take 1 tablet by mouth 2 (two) times daily.     carbidopa-levodopa 25-100 MG tablet Commonly known as:  SINEMET IR Take 0.5 tablets 5 (five) times daily by mouth.   donepezil 10 MG tablet Commonly known as:  ARICEPT Take 10 mg by mouth at bedtime.   fluticasone 50 MCG/ACT nasal spray Commonly known as:  FLONASE Place into the nose.   glucose blood test strip Commonly known as:  FREESTYLE LITE Use as instructed   HYDROcodone-acetaminophen 5-325 MG tablet Commonly known as:  NORCO/VICODIN Take 1 tablet by mouth every 6 (six) hours as needed for moderate pain.   ibuprofen 400 MG tablet Commonly known as:  ADVIL,MOTRIN Take 400 mg by mouth every 6 (six) hours as needed.   insulin aspart 100 UNIT/ML FlexPen Commonly known as:  NOVOLOG Sliding Scale TID with meals up to 30 units   ketorolac 0.5 % ophthalmic solution Commonly known as:  ACULAR INSTILL 1 DROP INTO RIGHT EYE Daily   LANTUS SOLOSTAR 100 UNIT/ML Solostar Pen Generic drug:  Insulin Glargine Inject 10 Units daily at 10 pm into the skin.   levothyroxine 50 MCG tablet Commonly known as:  SYNTHROID, LEVOTHROID Take 50 mcg by mouth daily before breakfast.   memantine 5 MG tablet Commonly known as:  NAMENDA Take 1 tablet daily by mouth.   mirabegron ER 50 MG Tb24 tablet Commonly known as:  MYRBETRIQ Take 1 tablet (50 mg total) by mouth daily.   omeprazole 40 MG capsule Commonly known as:  PRILOSEC Take 40 mg by mouth daily.   Vitamin D3 2000 units capsule Take by mouth.   vitamin E 400 UNIT capsule Take 400 Units by mouth daily.       Allergies: No Known Allergies  Family History: Family History  Problem Relation Age of Onset  .  Bladder Cancer Neg Hx   . Kidney cancer Neg Hx     Social History:  reports that  has never smoked. she has never used smokeless tobacco. She reports that she drinks alcohol. She reports that she does not use drugs.  ROS: UROLOGY Frequent Urination?: No Hard to postpone urination?: No Burning/pain with  urination?: No Get up at night to urinate?: Yes Leakage of urine?: No Urine stream starts and stops?: No Trouble starting stream?: No Do you have to strain to urinate?: No Blood in urine?: No Urinary tract infection?: No Sexually transmitted disease?: No Injury to kidneys or bladder?: No Painful intercourse?: No Weak stream?: No Currently pregnant?: No Vaginal bleeding?: No Last menstrual period?: n  Gastrointestinal Nausea?: No Vomiting?: No Indigestion/heartburn?: No Diarrhea?: No Constipation?: No  Constitutional Fever: No Night sweats?: No Weight loss?: Yes Fatigue?: Yes  Skin Skin rash/lesions?: No Itching?: No  Eyes Blurred vision?: Yes Double vision?: No  Ears/Nose/Throat Sore throat?: No Sinus problems?: Yes  Hematologic/Lymphatic Swollen glands?: No Easy bruising?: No  Cardiovascular Leg swelling?: No Chest pain?: No  Respiratory Cough?: No Shortness of breath?: Yes  Endocrine Excessive thirst?: Yes  Musculoskeletal Back pain?: No Joint pain?: No  Neurological Headaches?: No Dizziness?: Yes  Psychologic Depression?: No Anxiety?: No   Physical Exam: BP 105/66   Pulse 80   Ht 5\' 3"  (1.6 m)   Wt 100 lb 4.8 oz (45.5 kg)   BMI 17.77 kg/m   Constitutional: Well nourished. Alert and oriented, No acute distress. HEENT: [place] AT, moist mucus membranes. Trachea midline, no masses. Cardiovascular: No clubbing, cyanosis, or edema. Respiratory: Normal respiratory effort, no increased work of breathing. Skin: No rashes, bruises or suspicious lesions. Lymph: No cervical or inguinal adenopathy. Neurologic: Grossly intact, no focal deficits, moving all 4 extremities. Psychiatric: Normal mood and affect.  PTNS treatment: The needle electrode was inserted into the lower, inner aspect of the patient's left leg. The surface electrode was placed on the inside arch of the foot on the treatment leg. The lead set was connected to the stimulator and  the needle electrode clip was connected to the needle electrode. The stimulator that produces an adjustable electrical pulse that travels to the sacral nerve plexus via the tibial nerve was increased to 6 until the patient received a sensory response.     Assessment & Plan:    1. Frequency  Treatment Plan:  The needle electrode was removed without difficulty to the patient.  Patient tolerated the procedure for 30 minutes.  She will return next week for # 10 out of 12 of their weekly PTNS treatment's  2. Nocturia  - see above  Return in about 1 week (around 06/02/2017) for # 11 PTNS.  These notes generated with voice recognition software. I apologize for typographical errors.  Zara Council, [facility] [address] [place], [place] [address] [phone]

## 2017-05-26 ENCOUNTER — Encounter: Payer: Self-pay | Admitting: Urology

## 2017-05-26 ENCOUNTER — Ambulatory Visit: Payer: Medicare HMO | Admitting: Urology

## 2017-05-26 ENCOUNTER — Other Ambulatory Visit: Payer: Self-pay

## 2017-05-26 VITALS — BP 105/66 | HR 80 | Ht 63.0 in | Wt 100.3 lb

## 2017-05-26 DIAGNOSIS — R350 Frequency of micturition: Secondary | ICD-10-CM | POA: Diagnosis not present

## 2017-05-26 DIAGNOSIS — R351 Nocturia: Secondary | ICD-10-CM

## 2017-05-26 NOTE — Progress Notes (Signed)
PTNS  Session # 10  Health & Social Factors: No change Caffeine: 1 cup Alcohol: 0 Daytime voids #per day: 3 Night-time voids #per night: 1-2 Urgency: mild Incontinence Episodes #per day: 0 Ankle used: Left Treatment Setting: 6 Feeling/ Response: sensory Comments: n/a  Preformed By: Zara Council, PA-C  Assistant: C. Corinna Capra, CMA

## 2017-06-03 ENCOUNTER — Encounter: Payer: Self-pay | Admitting: Urology

## 2017-06-03 ENCOUNTER — Ambulatory Visit: Payer: Medicare HMO | Admitting: Urology

## 2017-06-03 VITALS — BP 92/58 | HR 78 | Ht 63.0 in | Wt 102.4 lb

## 2017-06-03 DIAGNOSIS — R350 Frequency of micturition: Secondary | ICD-10-CM

## 2017-06-03 DIAGNOSIS — N3281 Overactive bladder: Secondary | ICD-10-CM

## 2017-06-03 NOTE — Progress Notes (Signed)
PTNS  Session # 11  Health & Social Factors: no change Caffeine: 1 Alcohol: 0 Daytime voids #per day: 4 Night-time voids #per night: 1 Urgency: mild Incontinence Episodes #per day: 0 Ankle used: right Treatment Setting: 5 Feeling/ Response: both Comments: patient tolerated well  Preformed By: Elberta Leatherwood, CMA   Follow Up: 1 week #12

## 2017-06-09 ENCOUNTER — Encounter: Payer: Self-pay | Admitting: Urology

## 2017-06-09 ENCOUNTER — Ambulatory Visit: Payer: Medicare HMO | Admitting: Urology

## 2017-06-09 VITALS — BP 119/69 | HR 83 | Ht 63.0 in | Wt 101.0 lb

## 2017-06-09 DIAGNOSIS — R351 Nocturia: Secondary | ICD-10-CM

## 2017-06-09 DIAGNOSIS — R350 Frequency of micturition: Secondary | ICD-10-CM

## 2017-06-09 NOTE — Progress Notes (Signed)
Chief Complaint:  No chief complaint on file.    HPI: Patient is a 82 year old Caucasian female who presents today to begin her 12 weekly series of PTNS.  This will be her 12th of 12 weekly treatments.          Baseline complaints of frequency, urgency and nocturia.  She has been experiencing urgency x 4-7 (stable), frequency x 4-7 (stable), not restricting fluids to avoid visits to the restroom, is engaging in toilet mapping, incontinence x 4-7 (stable) and nocturia x 4-7 (improved).   Her PVR was 18 mL.   She has not seen any gross hematuria.  She denies fevers, chills, nausea or vomiting.  She is not having dysuria.     She did not find the Vesicare or Myrbetriq effective.     Previous Therapy:           Pelvic Floor Exercises           Biofeedback           Bladder Training           Fluid Management           Dietary Restrictions          Contraindications present for PTNS      Pacemaker - NO      Implantable defibrillator - NO      History of abnormal bleeding - NO      History of neuropathies or nerve damage - NO  Discussed with patient possible complications of procedure, such as discomfort, bleeding at insertion/stimulation site, procedure consent signed  Patient goals:     To stop the excessive urination.    Reemphasized that most patient's will see benefit by the 8th week of treatment, but about 10 to 20% of patient may be late responders.  If they happen to be late responders, it is important to continue with the therapy beyond the 12 weekly treatments as many of those patient find benefit.  Today, she is experiencing frequency and nocturia.  She denies any dysuria, gross hematuria suprapubic pain. She's not had fevers, chills, nausea or vomiting.   Treatment #  Baseline   # 1   # 2   # 3   # 4   # 5   # 6  Overall improvement  Day time voids   4-7     5    5    5    5     2-3   1-2  %150  Night time voids   4-7     7    2    2    3    2   1   %150  Incontinence  episodes   4-7     3    2    2    2    2    1-2  %150  Urgency  Strong Strong None  None Strong Mild  Strong % 28    Treatment # Baseline  #7  #8  #9  #10  #11 #12 Overall improvement  Day time voids  4-7  4-5   3-4   4   3  4   3-4  80%  Night time voids  4-7  0-1   1-2   1   1-2  1  0-2  200%  Incontinence episodes  4-7  1-2   0   1   0  0  0-1  200%  Urgency Strong  mild   mild  mild  mild  mild  mild  200%    PMH: Past Medical History:  Diagnosis Date  . Asthma   . Collapsed lung   . Diabetes mellitus without complication ([place])    Patient takes Insulin twice a day.   Marland Kitchen GERD (gastroesophageal reflux disease)   . Hyperlipidemia   . Hypertension   . Hypothyroidism   . Parkinson's disease ([place])   . Reactive airway disease     Surgical History: Past Surgical History:  Procedure Laterality Date  . ABDOMINAL HYSTERECTOMY    . CHOLECYSTECTOMY    . COLONOSCOPY WITH PROPOFOL N/A 03/29/2015   Procedure: COLONOSCOPY WITH PROPOFOL;  Surgeon: Hulen Luster, MD;  Location: [facility] ENDOSCOPY;  Service: Gastroenterology;  Laterality: N/A;  . ESOPHAGOGASTRODUODENOSCOPY (EGD) WITH PROPOFOL N/A 03/29/2015   Procedure: ESOPHAGOGASTRODUODENOSCOPY (EGD) WITH PROPOFOL;  Surgeon: Hulen Luster, MD;  Location: [facility] ENDOSCOPY;  Service: Gastroenterology;  Laterality: N/A;  . lung collapse     broken rib from fall punctured lung    Home Medications:  Allergies as of 06/09/2017   No Known Allergies     Medication List        Accurate as of 06/09/17  2:04 PM. Always use your most recent med list.          AZILECT 1 MG Tabs tablet Generic drug:  rasagiline Take 1 mg by mouth daily.   BD PEN NEEDLE NANO U/F 32G X 4 MM Misc Generic drug:  Insulin Pen Needle   budesonide-formoterol 80-4.5 MCG/ACT inhaler Commonly known as:  SYMBICORT Inhale 2 puffs into the lungs 2 (two) times daily.   calcium-vitamin D 500-400 MG-UNIT tablet Commonly known as:  OSCAL-500 Take 1 tablet by mouth 2 (two) times  daily.   carbidopa-levodopa 25-100 MG tablet Commonly known as:  SINEMET IR Take 0.5 tablets 5 (five) times daily by mouth.   donepezil 10 MG tablet Commonly known as:  ARICEPT Take 10 mg by mouth at bedtime.   fluticasone 50 MCG/ACT nasal spray Commonly known as:  FLONASE Place into the nose.   glucose blood test strip Commonly known as:  FREESTYLE LITE Use as instructed   HYDROcodone-acetaminophen 5-325 MG tablet Commonly known as:  NORCO/VICODIN Take 1 tablet by mouth every 6 (six) hours as needed for moderate pain.   ibuprofen 400 MG tablet Commonly known as:  ADVIL,MOTRIN Take 400 mg by mouth every 6 (six) hours as needed.   insulin aspart 100 UNIT/ML FlexPen Commonly known as:  NOVOLOG Sliding Scale TID with meals up to 30 units   ketorolac 0.5 % ophthalmic solution Commonly known as:  ACULAR INSTILL 1 DROP INTO RIGHT EYE Daily   LANTUS SOLOSTAR 100 UNIT/ML Solostar Pen Generic drug:  Insulin Glargine Inject 10 Units daily at 10 pm into the skin.   levothyroxine 50 MCG tablet Commonly known as:  SYNTHROID, LEVOTHROID Take 50 mcg by mouth daily before breakfast.   memantine 5 MG tablet Commonly known as:  NAMENDA Take 1 tablet daily by mouth.   mirabegron ER 50 MG Tb24 tablet Commonly known as:  MYRBETRIQ Take 1 tablet (50 mg total) by mouth daily.   omeprazole 40 MG capsule Commonly known as:  PRILOSEC Take 40 mg by mouth daily.   Vitamin D3 2000 units capsule Take by mouth.   vitamin E 400 UNIT capsule Take 400 Units by mouth daily.       Allergies: No Known Allergies  Family History:  Family History  Problem Relation Age of Onset  . Bladder Cancer Neg Hx   . Kidney cancer Neg Hx     Social History:  reports that  has never smoked. she has never used smokeless tobacco. She reports that she drinks alcohol. She reports that she does not use drugs.  ROS: UROLOGY Frequent Urination?: No Hard to postpone urination?: No Burning/pain with  urination?: No Get up at night to urinate?: Yes Leakage of urine?: Yes Urine stream starts and stops?: No Trouble starting stream?: No Do you have to strain to urinate?: No Blood in urine?: No Urinary tract infection?: No Sexually transmitted disease?: No Injury to kidneys or bladder?: No Painful intercourse?: No Weak stream?: No Currently pregnant?: No Vaginal bleeding?: No Last menstrual period?: n  Gastrointestinal Nausea?: No Vomiting?: No Indigestion/heartburn?: No Diarrhea?: No Constipation?: No  Constitutional Fever: No Night sweats?: No Weight loss?: No Fatigue?: No  Skin Skin rash/lesions?: No Itching?: No  Eyes Blurred vision?: No Double vision?: No  Ears/Nose/Throat Sore throat?: No Sinus problems?: No  Hematologic/Lymphatic Swollen glands?: No Easy bruising?: No  Cardiovascular Leg swelling?: No Chest pain?: No  Respiratory Cough?: No Shortness of breath?: No  Endocrine Excessive thirst?: No  Musculoskeletal Back pain?: No Joint pain?: No  Neurological Headaches?: No Dizziness?: No  Psychologic Depression?: No Anxiety?: No   Physical Exam: BP 119/69   Pulse 83   Ht 5\' 3"  (1.6 m)   Wt 101 lb (45.8 kg)   BMI 17.89 kg/m   Constitutional: Well nourished. Alert and oriented, No acute distress. HEENT: [place] AT, moist mucus membranes. Trachea midline, no masses. Cardiovascular: No clubbing, cyanosis, or edema. Respiratory: Normal respiratory effort, no increased work of breathing. Skin: No rashes, bruises or suspicious lesions. Lymph: No cervical or inguinal adenopathy. Neurologic: Grossly intact, no focal deficits, moving all 4 extremities. Psychiatric: Normal mood and affect.  PTNS treatment: The needle electrode was inserted into the lower, inner aspect of the patient's right leg. The surface electrode was placed on the inside arch of the foot on the treatment leg. The lead set was connected to the stimulator and the needle  electrode clip was connected to the needle electrode. The stimulator that produces an adjustable electrical pulse that travels to the sacral nerve plexus via the tibial nerve was increased to 7 until the patient received a sensory.     Assessment & Plan:    1. Frequency  Treatment Plan:  The needle electrode was removed without difficulty to the patient.  Patient tolerated the procedure for 30 minutes.  She will return next week for # 12 out of 12 of their weekly PTNS treatment's  2. Nocturia  - see above  Return in about 1 month (around 07/10/2017) for Maintenance PTNS.  These notes generated with voice recognition software. I apologize for typographical errors.  Zara Council, [facility] [address], [place], [place] [address] [phone]

## 2017-06-09 NOTE — Progress Notes (Signed)
PTNS  Session # 12  Health & Social Factors: same Caffeine: 1 Alcohol: 0 Daytime voids #per day: 3-4 Night-time voids #per night: 0-2 Urgency: mild Incontinence Episodes #per day: 0-1 Ankle used: right Treatment Setting: 7 Feeling/ Response: sensory Comments: n/a  Preformed By: Zara Council, PAC  Assistant: Fonnie Jarvis, CMA  Follow Up: 1 month

## 2017-06-10 ENCOUNTER — Ambulatory Visit: Payer: Self-pay | Admitting: Nurse Practitioner

## 2017-06-15 ENCOUNTER — Ambulatory Visit: Payer: Medicare HMO | Admitting: Urology

## 2017-06-23 DIAGNOSIS — E039 Hypothyroidism, unspecified: Secondary | ICD-10-CM | POA: Diagnosis not present

## 2017-06-23 DIAGNOSIS — E119 Type 2 diabetes mellitus without complications: Secondary | ICD-10-CM | POA: Diagnosis not present

## 2017-06-23 DIAGNOSIS — E1142 Type 2 diabetes mellitus with diabetic polyneuropathy: Secondary | ICD-10-CM | POA: Diagnosis not present

## 2017-06-23 DIAGNOSIS — B351 Tinea unguium: Secondary | ICD-10-CM | POA: Diagnosis not present

## 2017-06-23 DIAGNOSIS — E042 Nontoxic multinodular goiter: Secondary | ICD-10-CM | POA: Diagnosis not present

## 2017-06-23 DIAGNOSIS — Z794 Long term (current) use of insulin: Secondary | ICD-10-CM | POA: Diagnosis not present

## 2017-06-23 DIAGNOSIS — L851 Acquired keratosis [keratoderma] palmaris et plantaris: Secondary | ICD-10-CM | POA: Diagnosis not present

## 2017-07-12 DIAGNOSIS — L578 Other skin changes due to chronic exposure to nonionizing radiation: Secondary | ICD-10-CM | POA: Diagnosis not present

## 2017-07-12 DIAGNOSIS — L820 Inflamed seborrheic keratosis: Secondary | ICD-10-CM | POA: Diagnosis not present

## 2017-07-12 DIAGNOSIS — L821 Other seborrheic keratosis: Secondary | ICD-10-CM | POA: Diagnosis not present

## 2017-07-13 DIAGNOSIS — R42 Dizziness and giddiness: Secondary | ICD-10-CM | POA: Diagnosis not present

## 2017-07-13 DIAGNOSIS — R413 Other amnesia: Secondary | ICD-10-CM | POA: Diagnosis not present

## 2017-07-13 DIAGNOSIS — I959 Hypotension, unspecified: Secondary | ICD-10-CM | POA: Diagnosis not present

## 2017-07-13 DIAGNOSIS — G20 Parkinson's disease: Secondary | ICD-10-CM | POA: Diagnosis not present

## 2017-07-13 DIAGNOSIS — G478 Other sleep disorders: Secondary | ICD-10-CM | POA: Diagnosis not present

## 2017-07-20 ENCOUNTER — Ambulatory Visit: Payer: Self-pay | Admitting: Internal Medicine

## 2017-07-20 NOTE — Progress Notes (Signed)
Chief Complaint:  No chief complaint on file.    HPI: Patient is a 82 year old Caucasian female who presents today to begin her maintenance PTNS.          Baseline complaints of frequency, urgency and nocturia.  She has been experiencing urgency x 4-7 (stable), frequency x 4-7 (stable), not restricting fluids to avoid visits to the restroom, is engaging in toilet mapping, incontinence x 4-7 (stable) and nocturia x 4-7 (improved).   Her PVR was 18 mL.   She has not seen any gross hematuria.  She denies fevers, chills, nausea or vomiting.  She is not having dysuria.     She did not find the Vesicare or Myrbetriq effective.     Previous Therapy:           Pelvic Floor Exercises           Biofeedback           Bladder Training           Fluid Management           Dietary Restrictions          Contraindications present for PTNS      Pacemaker - NO      Implantable defibrillator - NO      History of abnormal bleeding - NO      History of neuropathies or nerve damage - NO  Discussed with patient possible complications of procedure, such as discomfort, bleeding at insertion/stimulation site, procedure consent signed  Patient goals:     To stop the excessive urination.    Reemphasized that most patient's will see benefit by the 8th week of treatment, but about 10 to 20% of patient may be late responders.  If they happen to be late responders, it is important to continue with the therapy beyond the 12 weekly treatments as many of those patient find benefit.  Today, she is experiencing frequency and nocturia.  She denies any dysuria, gross hematuria suprapubic pain. She's not had fevers, chills, nausea or vomiting.   Treatment #  Baseline   # 1   # 2   # 3   # 4   # 5   # 6  Overall improvement  Day time voids   4-7     5    5    5    5     2-3   1-2  %150  Night time voids   4-7     7    2    2    3    2   1   %150  Incontinence episodes   4-7     3    2    2    2    2    1-2  %150    Urgency  Strong Strong None  None Strong Mild  Strong % 28    Treatment # Baseline  #7  #8  #9  #10  #11 #12 Overall improvement  Day time voids  4-7  4-5   3-4   4   3  4   3-4  80%  Night time voids  4-7  0-1   1-2   1   1-2  1  0-2  200%  Incontinence episodes  4-7  1-2   0   1   0  0  0-1  200%  Urgency Strong  mild   mild  mild  mild  mild  mild  200%   Maintenance  Treatment # Baseline  #1  #2  #3  #4  #5 #6 Overall improvement  Day time voids  3-4                  Night time voids  0-2                   Incontinence episodes 0-1                   Urgency mild                 PTNS treatment: The needle electrode was inserted into the lower, inner aspect of the patient's right leg. The surface electrode was placed on the inside arch of the foot on the treatment leg. The lead set was connected to the stimulator and the needle electrode clip was connected to the needle electrode. The stimulator that produces an adjustable electrical pulse that travels to the sacral nerve plexus via the tibial nerve was increased to 7 until the patient received a sensory.     Assessment & Plan:    1. Frequency  Treatment Plan:  The needle electrode was removed without difficulty to the patient.  Patient tolerated the procedure for 30 minutes.  She will return next week for # 12 out of 12 of their weekly PTNS treatment's  2. Nocturia  - see above  No Follow-up on file.  These notes generated with voice recognition software. I apologize for typographical errors.  Zara Council, [facility] [address] [place], [place] [address] [phone]

## 2017-07-21 ENCOUNTER — Ambulatory Visit (INDEPENDENT_AMBULATORY_CARE_PROVIDER_SITE_OTHER): Payer: Medicare HMO

## 2017-07-21 DIAGNOSIS — R351 Nocturia: Secondary | ICD-10-CM

## 2017-07-21 DIAGNOSIS — R350 Frequency of micturition: Secondary | ICD-10-CM | POA: Diagnosis not present

## 2017-07-21 NOTE — Progress Notes (Signed)
PTNS  Session # Maintenance 1  Health & Social Factors: no change Caffeine: 1 Alcohol: 0 Daytime voids #per day: 3-4 Night-time voids #per night: 4-5 Urgency: mild Incontinence Episodes #per day: 1 Ankle used: left Treatment Setting: 1 Feeling/ Response: both Comments: n/a  Preformed By: Fonnie Jarvis, CMA    Follow Up: 1 month

## 2017-07-22 ENCOUNTER — Other Ambulatory Visit: Payer: Self-pay | Admitting: Internal Medicine

## 2017-08-02 ENCOUNTER — Encounter: Payer: Self-pay | Admitting: Internal Medicine

## 2017-08-12 DIAGNOSIS — H6123 Impacted cerumen, bilateral: Secondary | ICD-10-CM | POA: Diagnosis not present

## 2017-08-12 DIAGNOSIS — H900 Conductive hearing loss, bilateral: Secondary | ICD-10-CM | POA: Diagnosis not present

## 2017-08-19 ENCOUNTER — Ambulatory Visit: Payer: Medicare HMO

## 2017-08-26 DIAGNOSIS — H26493 Other secondary cataract, bilateral: Secondary | ICD-10-CM | POA: Diagnosis not present

## 2017-08-30 NOTE — Progress Notes (Signed)
08/31/2017 11:20 AM   Jacqueline [name] 1936/01/11 [id number]  Referring provider: Lavera [name], Jacqueline [name], Jacqueline [name] [id number]  Chief Complaint  Patient presents with  . Urinary Frequency    HPI: Patient is a 82 -year-old Caucasian female who has completed her 12 weekly treatments of PTNS for incontinence with her daughter, Jacqueline [name].    She feels that the PTNS treatments have not been effective.  She has also failed anticholinergics and Myrbetriq.  She states that she is still experiencing frequency, urgency, nocturia and incontinence.  Her PVR today is 29 mL.    After her 12 weeks of PTNS therapy, she is experiencing urgency x4-7, frequency x4-7, nocturia x4-7 and incontinence x4-7.  These were her baseline symptoms.  Patient denies any gross hematuria, dysuria or suprapubic/flank pain.  Patient denies any fevers, chills, nausea or vomiting.   Cystoscopy performed on 08/03/2016 with Dr. Junious [name] was negative.  She does not have a history of urinary tract infections, STI's or injury to the bladder.   She does not have a history of nephrolithiasis, GU surgery or GU trauma.   She is not sexually active.  She is post menopausal.   She denies constipation and/or diarrhea.   She is not having pain with bladder filling.    PMH: Past Medical History:  Diagnosis Date  . Asthma   . Collapsed lung   . Diabetes mellitus without complication (Jacqueline [name])    Patient takes Insulin twice a day.   Marland Kitchen GERD (gastroesophageal reflux disease)   . Hyperlipidemia   . Hypertension   . Hypothyroidism   . Parkinson's disease ([place])   . Reactive airway disease     Surgical History: Past Surgical History:  Procedure Laterality Date  . ABDOMINAL HYSTERECTOMY    . CHOLECYSTECTOMY    . COLONOSCOPY WITH PROPOFOL N/A 03/29/2015   Procedure: COLONOSCOPY WITH PROPOFOL;  Surgeon: Jacqueline Luster, MD;  Location: [facility] ENDOSCOPY;  Service: Gastroenterology;  Laterality: N/A;  .  ESOPHAGOGASTRODUODENOSCOPY (EGD) WITH PROPOFOL N/A 03/29/2015   Procedure: ESOPHAGOGASTRODUODENOSCOPY (EGD) WITH PROPOFOL;  Surgeon: Jacqueline Luster, MD;  Location: [facility] ENDOSCOPY;  Service: Gastroenterology;  Laterality: N/A;  . lung collapse     broken rib from fall punctured lung    Home Medications:  Allergies as of 08/31/2017   No Known Allergies     Medication List        Accurate as of 08/31/17 11:59 PM. Always use your most recent med list.          azelastine 0.05 % ophthalmic solution Commonly known as:  OPTIVAR   AZILECT 1 MG Tabs tablet Generic drug:  rasagiline Take 1 mg by mouth daily.   BD PEN NEEDLE NANO U/F 32G X 4 MM Misc Generic drug:  Insulin Pen Needle FOR TWICE DAILY BLOOD SUGAR TESTING E11.65   budesonide-formoterol 80-4.5 MCG/ACT inhaler Commonly known as:  SYMBICORT Inhale 2 puffs into the lungs 2 (two) times daily.   calcium-vitamin D 500-400 MG-UNIT tablet Commonly known as:  OSCAL-500 Take 1 tablet by mouth 2 (two) times daily.   carbidopa-levodopa 25-100 MG tablet Commonly known as:  SINEMET IR Take 0.5 tablets 5 (five) times daily by mouth.   donepezil 10 MG tablet Commonly known as:  ARICEPT Take 10 mg by mouth at bedtime.   fluticasone 50 MCG/ACT nasal spray Commonly known as:  FLONASE Place into the nose.   glucose blood test strip Commonly known as:  FREESTYLE LITE Use as  instructed   ibuprofen 400 MG tablet Commonly known as:  ADVIL,MOTRIN Take 400 mg by mouth every 6 (six) hours as needed.   insulin aspart 100 UNIT/ML FlexPen Commonly known as:  NOVOLOG Sliding Scale TID with meals up to 30 units   ketorolac 0.5 % ophthalmic solution Commonly known as:  ACULAR INSTILL 1 DROP INTO RIGHT EYE Daily   LANTUS SOLOSTAR 100 UNIT/ML Solostar Pen Generic drug:  Insulin Glargine Inject 10 Units daily at 10 pm into the skin.   levothyroxine 50 MCG tablet Commonly known as:  SYNTHROID, LEVOTHROID Take 50 mcg by mouth daily before  breakfast.   memantine 5 MG tablet Commonly known as:  NAMENDA Take 1 tablet daily by mouth.   mirabegron ER 50 MG Tb24 tablet Commonly known as:  MYRBETRIQ Take 1 tablet (50 mg total) by mouth daily.   omeprazole 40 MG capsule Commonly known as:  PRILOSEC Take 40 mg by mouth daily.   Vitamin D3 2000 units capsule Take by mouth.   vitamin E 400 UNIT capsule Take 400 Units by mouth daily.       Allergies: No Known Allergies  Family History: Family History  Problem Relation Age of Onset  . Bladder Cancer Neg Hx   . Kidney cancer Neg Hx     Social History:  reports that she has never smoked. She has never used smokeless tobacco. She reports that she drinks alcohol. She reports that she does not use drugs.  ROS: UROLOGY Frequent Urination?: Yes Hard to postpone urination?: Yes Burning/pain with urination?: No Get up at night to urinate?: Yes Leakage of urine?: Yes Urine stream starts and stops?: No Trouble starting stream?: No Do you have to strain to urinate?: No Blood in urine?: No Urinary tract infection?: No Sexually transmitted disease?: No Injury to kidneys or bladder?: No Painful intercourse?: No Weak stream?: No Currently pregnant?: No Vaginal bleeding?: No Last menstrual period?: n  Gastrointestinal Nausea?: No Vomiting?: No Indigestion/heartburn?: No Diarrhea?: No Constipation?: No  Constitutional Fever: No Night sweats?: Yes Weight loss?: No Fatigue?: Yes  Skin Skin rash/lesions?: No Itching?: No  Eyes Blurred vision?: Yes Double vision?: No  Ears/Nose/Throat Sore throat?: No Sinus problems?: No  Hematologic/Lymphatic Swollen glands?: No Easy bruising?: No  Cardiovascular Leg swelling?: No Chest pain?: No  Respiratory Cough?: No Shortness of breath?: No  Endocrine Excessive thirst?: No  Musculoskeletal Back pain?: No Joint pain?: No  Neurological Headaches?: No Dizziness?: Yes  Psychologic Depression?:  No Anxiety?: No  Physical Exam: BP 106/62 (BP Location: Right Arm, Patient Position: Sitting, Cuff Size: Normal)   Pulse 68   Ht 5\' 3"  (1.6 m)   Wt 109 lb 9.6 oz (49.7 kg)   BMI 19.41 kg/m   Constitutional: Well nourished. Alert and oriented, No acute distress. HEENT: [place] AT, moist mucus membranes. Trachea midline, no masses. Cardiovascular: No clubbing, cyanosis, or edema. Respiratory: Normal respiratory effort, no increased work of breathing. GI: Abdomen is soft, non tender, non distended, no abdominal masses. Liver and spleen not palpable.  No hernias appreciated.  Stool sample for occult testing is not indicated.   GU: No CVA tenderness.  No bladder fullness or masses.  Atrophic external genitalia, normal pubic hair distribution, no lesions.  Normal urethral meatus, no lesions, no prolapse, no discharge.   No urethral masses, tenderness and/or tenderness. No bladder fullness, tenderness or masses. Pale vagina mucosa, poor estrogen effect, no discharge, no lesions, Poor pelvic support, Grade II cystocele.  No rectocele noted.  Cervix and uterus are surgically absent.   No adnexal/parametria masses or tenderness noted.  Anus and perineum are without rashes or lesions.    Skin: No rashes, bruises or suspicious lesions. Lymph: No cervical or inguinal adenopathy. Neurologic: Grossly intact, no focal deficits, moving all 4 extremities. Psychiatric: Normal mood and affect.  Laboratory Data: Lab Results  Component Value Date   WBC 5.9 04/16/2015   HGB 14.0 04/16/2015   HCT 42.9 04/16/2015   MCV 91.1 04/16/2015   PLT 174 04/16/2015    Lab Results  Component Value Date   CREATININE 1.00 03/17/2016    No results found for: PSA  No results found for: TESTOSTERONE  No results found for: HGBA1C  Lab Results  Component Value Date   TSH 0.624 11/06/2014    No results found for: CHOL, HDL, CHOLHDL, VLDL, LDLCALC  Lab Results  Component Value Date   AST 20 04/16/2015   Lab  Results  Component Value Date   ALT 17 04/16/2015   No components found for: ALKALINEPHOPHATASE No components found for: BILIRUBINTOTAL  No results found for: ESTRADIOL  Urinalysis    Component Value Date/Time   COLORURINE YELLOW (A) 04/16/2015 1859   APPEARANCEUR Clear 08/03/2016 1146   LABSPEC 1.017 04/16/2015 1859   LABSPEC 1.031 08/11/2012 2247   PHURINE 5.0 04/16/2015 1859   GLUCOSEU Negative 08/03/2016 1146   GLUCOSEU 50 mg/dL 08/11/2012 2247   HGBUR 2+ (A) 04/16/2015 1859   BILIRUBINUR Negative 08/03/2016 1146   BILIRUBINUR Negative 08/11/2012 2247   KETONESUR NEGATIVE 04/16/2015 1859   PROTEINUR 1+ (A) 08/03/2016 1146   PROTEINUR NEGATIVE 04/16/2015 1859   NITRITE Negative 08/03/2016 1146   NITRITE NEGATIVE 04/16/2015 1859   LEUKOCYTESUR 2+ (A) 08/03/2016 1146   LEUKOCYTESUR Negative 08/11/2012 2247    I have reviewed the labs.   Pertinent Imaging: Results for NICOSHA, STRUVE (MRN [id number]) as of 08/31/2017 14:42  Ref. Range 08/31/2017 14:21  Scan Result Unknown 29     Assessment & Plan:    1. Mixed Incontinence  - discussed behavioral therapies, bladder training, bladder control strategies and pelvic floor muscle training  - discussed fluid management   - failed medical therapy with anticholinergic therapy and beta-3 adrenergic receptor agonist - cystoscopy was negative  - refer to PT for pelvic floor therapy  - RTC after PT completed  2. Cystocele See above  Return for follow up after PT.  These notes generated with voice recognition software. I apologize for typographical errors.  Zara Council, PA-C  [facility] [address] [place], [place] [address] [phone]  I spent 25 minutes with this patient of which greater than 50% was spent in counseling and coordination of care with the patient.

## 2017-08-31 ENCOUNTER — Ambulatory Visit: Payer: Medicare HMO | Admitting: Urology

## 2017-08-31 ENCOUNTER — Other Ambulatory Visit: Payer: Self-pay

## 2017-08-31 ENCOUNTER — Telehealth: Payer: Self-pay | Admitting: Urology

## 2017-08-31 ENCOUNTER — Encounter: Payer: Self-pay | Admitting: Urology

## 2017-08-31 VITALS — BP 106/62 | HR 68 | Ht 63.0 in | Wt 109.6 lb

## 2017-08-31 DIAGNOSIS — N3946 Mixed incontinence: Secondary | ICD-10-CM

## 2017-08-31 DIAGNOSIS — N8111 Cystocele, midline: Secondary | ICD-10-CM

## 2017-08-31 LAB — BLADDER SCAN AMB NON-IMAGING: Scan Result: 29

## 2017-08-31 NOTE — Telephone Encounter (Signed)
Would you call our home health services and see if they provide pelvic floor PT for patients in their home?

## 2017-09-06 ENCOUNTER — Other Ambulatory Visit: Payer: Self-pay | Admitting: Urology

## 2017-09-06 DIAGNOSIS — N393 Stress incontinence (female) (male): Secondary | ICD-10-CM

## 2017-09-06 NOTE — Progress Notes (Signed)
Please let Breniyah and her daughter know that home health does not provide in home PT.  I have put a referral in for [facility] pelvic floor therapy.     Spoke with pt daughter, [name], and made aware. [name] voiced understanding.

## 2017-09-06 NOTE — Telephone Encounter (Signed)
I spoke w/ Jacqueline [name] about this, she reached out to [facility] rep, they state they do not provide in home pelvic floor pt, they also said they are not sure of any others that do.

## 2017-09-09 DIAGNOSIS — E119 Type 2 diabetes mellitus without complications: Secondary | ICD-10-CM | POA: Diagnosis not present

## 2017-09-09 DIAGNOSIS — H26493 Other secondary cataract, bilateral: Secondary | ICD-10-CM | POA: Diagnosis not present

## 2017-09-09 DIAGNOSIS — H26492 Other secondary cataract, left eye: Secondary | ICD-10-CM | POA: Diagnosis not present

## 2017-09-09 DIAGNOSIS — H40013 Open angle with borderline findings, low risk, bilateral: Secondary | ICD-10-CM | POA: Diagnosis not present

## 2017-09-09 DIAGNOSIS — H35363 Drusen (degenerative) of macula, bilateral: Secondary | ICD-10-CM | POA: Diagnosis not present

## 2017-09-14 DIAGNOSIS — E042 Nontoxic multinodular goiter: Secondary | ICD-10-CM | POA: Diagnosis not present

## 2017-09-14 DIAGNOSIS — Z794 Long term (current) use of insulin: Secondary | ICD-10-CM | POA: Diagnosis not present

## 2017-09-14 DIAGNOSIS — E119 Type 2 diabetes mellitus without complications: Secondary | ICD-10-CM | POA: Diagnosis not present

## 2017-09-14 DIAGNOSIS — E039 Hypothyroidism, unspecified: Secondary | ICD-10-CM | POA: Diagnosis not present

## 2017-09-22 ENCOUNTER — Ambulatory Visit (INDEPENDENT_AMBULATORY_CARE_PROVIDER_SITE_OTHER): Payer: Medicare HMO | Admitting: Internal Medicine

## 2017-09-22 ENCOUNTER — Encounter: Payer: Self-pay | Admitting: Internal Medicine

## 2017-09-22 VITALS — BP 144/72 | HR 79 | Resp 16 | Ht 62.0 in | Wt 106.8 lb

## 2017-09-22 DIAGNOSIS — G309 Alzheimer's disease, unspecified: Secondary | ICD-10-CM | POA: Diagnosis not present

## 2017-09-22 DIAGNOSIS — J4520 Mild intermittent asthma, uncomplicated: Secondary | ICD-10-CM

## 2017-09-22 DIAGNOSIS — F0150 Vascular dementia without behavioral disturbance: Secondary | ICD-10-CM

## 2017-09-22 DIAGNOSIS — Z794 Long term (current) use of insulin: Secondary | ICD-10-CM

## 2017-09-22 DIAGNOSIS — G20 Parkinson's disease: Secondary | ICD-10-CM

## 2017-09-22 DIAGNOSIS — E118 Type 2 diabetes mellitus with unspecified complications: Secondary | ICD-10-CM

## 2017-09-22 DIAGNOSIS — E038 Other specified hypothyroidism: Secondary | ICD-10-CM | POA: Diagnosis not present

## 2017-09-22 DIAGNOSIS — F0280 Dementia in other diseases classified elsewhere without behavioral disturbance: Secondary | ICD-10-CM

## 2017-09-22 DIAGNOSIS — R69 Illness, unspecified: Secondary | ICD-10-CM | POA: Diagnosis not present

## 2017-09-22 DIAGNOSIS — G20A1 Parkinson's disease without dyskinesia, without mention of fluctuations: Secondary | ICD-10-CM

## 2017-09-22 NOTE — Progress Notes (Signed)
[facility] [place], [place] [address]  Internal MEDICINE  Office Visit Note  Patient Name: Jacqueline [name]  [id number]  [id number]  Date of Service: 09/22/2017  Chief Complaint  Patient presents with  . Hypothyroidism  . Hypertension  . Hyperlipidemia  . Diabetes    HPI  Pt is here for routine follow up. She has been stable, does see neurology for parkinsonism and endocrinology for dm, Pt still has abnormal movements per daughter, she is having hard time controlling her saliva.She has stable weight since last visit  Current Medication: Outpatient Encounter Medications as of 09/22/2017  Medication Sig  . azelastine (OPTIVAR) 0.05 % ophthalmic solution   . BD PEN NEEDLE NANO U/F 32G X 4 MM MISC FOR TWICE DAILY BLOOD SUGAR TESTING E11.65  . budesonide-formoterol (SYMBICORT) 80-4.5 MCG/ACT inhaler Inhale 2 puffs into the lungs 2 (two) times daily.  . calcium-vitamin D (OSCAL-500) 500-400 MG-UNIT tablet Take 1 tablet by mouth 2 (two) times daily.  . carbidopa-levodopa (SINEMET IR) 25-100 MG tablet Take 0.5 tablets 5 (five) times daily by mouth.   . Cholecalciferol (VITAMIN D3) 2000 units capsule Take by mouth.  . donepezil (ARICEPT) 10 MG tablet Take 10 mg by mouth at bedtime.  . fluticasone (FLONASE) 50 MCG/ACT nasal spray Place into the nose.  Marland Kitchen glucose blood (FREESTYLE LITE) test strip Use as instructed  . ibuprofen (ADVIL,MOTRIN) 400 MG tablet Take 400 mg by mouth every 6 (six) hours as needed.  . insulin aspart (NOVOLOG) 100 UNIT/ML FlexPen Sliding Scale TID with meals up to 30 units  . Insulin Glargine (LANTUS SOLOSTAR) 100 UNIT/ML Solostar Pen Inject 10 Units daily at 10 pm into the skin.   Marland Kitchen ketorolac (ACULAR) 0.5 % ophthalmic solution INSTILL 1 DROP INTO RIGHT EYE Daily  . levothyroxine (SYNTHROID, LEVOTHROID) 50 MCG tablet Take 50 mcg by mouth daily before breakfast.  . memantine (NAMENDA) 5 MG tablet Take 1 tablet daily by mouth.  . mirabegron ER  (MYRBETRIQ) 50 MG TB24 tablet Take 1 tablet (50 mg total) by mouth daily. (Patient not taking: Reported on 08/31/2017)  . omeprazole (PRILOSEC) 40 MG capsule Take 40 mg by mouth daily.  . rasagiline (AZILECT) 1 MG TABS tablet Take 1 mg by mouth daily.  . vitamin E 400 UNIT capsule Take 400 Units by mouth daily.   No facility-administered encounter medications on file as of 09/22/2017.     Surgical History: Past Surgical History:  Procedure Laterality Date  . ABDOMINAL HYSTERECTOMY    . CHOLECYSTECTOMY    . COLONOSCOPY WITH PROPOFOL N/A 03/29/2015   Procedure: COLONOSCOPY WITH PROPOFOL;  Surgeon: Hulen Luster, MD;  Location: [facility] ENDOSCOPY;  Service: Gastroenterology;  Laterality: N/A;  . ESOPHAGOGASTRODUODENOSCOPY (EGD) WITH PROPOFOL N/A 03/29/2015   Procedure: ESOPHAGOGASTRODUODENOSCOPY (EGD) WITH PROPOFOL;  Surgeon: Hulen Luster, MD;  Location: [facility] ENDOSCOPY;  Service: Gastroenterology;  Laterality: N/A;  . lung collapse     broken rib from fall punctured lung    Medical History: Past Medical History:  Diagnosis Date  . Asthma   . Collapsed lung   . Diabetes mellitus without complication ([place])    Patient takes Insulin twice a day.   Marland Kitchen GERD (gastroesophageal reflux disease)   . Hyperlipidemia   . Hypertension   . Hypothyroidism   . Parkinson's disease ([place])   . Reactive airway disease     Family History: Family History  Problem Relation Age of Onset  . Bladder Cancer Neg Hx   .  Kidney cancer Neg Hx     Social History   Socioeconomic History  . Marital status: Widowed    Spouse name: Not on file  . Number of children: Not on file  . Years of education: Not on file  . Highest education level: Not on file  Occupational History  . Not on file  Social Needs  . Financial resource strain: Not on file  . Food insecurity:    Worry: Not on file    Inability: Not on file  . Transportation needs:    Medical: Not on file    Non-medical: Not on file  Tobacco Use  . Smoking  status: Never Smoker  . Smokeless tobacco: Never Used  Substance and Sexual Activity  . Alcohol use: Yes    Comment: Occasional  . Drug use: No  . Sexual activity: Not on file  Lifestyle  . Physical activity:    Days per week: Not on file    Minutes per session: Not on file  . Stress: Not on file  Relationships  . Social connections:    Talks on phone: Not on file    Gets together: Not on file    Attends religious service: Not on file    Active member of club or organization: Not on file    Attends meetings of clubs or organizations: Not on file    Relationship status: Not on file  . Intimate partner violence:    Fear of current or ex partner: Not on file    Emotionally abused: Not on file    Physically abused: Not on file    Forced sexual activity: Not on file  Other Topics Concern  . Not on file  Social History Narrative  . Not on file   Review of Systems  Constitutional: Negative for chills, diaphoresis and fatigue.  HENT: Negative for ear pain, postnasal drip and sinus pressure.   Eyes: Negative for photophobia, discharge, redness, itching and visual disturbance.  Respiratory: Negative for cough, shortness of breath and wheezing.   Cardiovascular: Negative for chest pain, palpitations and leg swelling.  Gastrointestinal: Negative for abdominal pain, constipation, diarrhea, nausea and vomiting.  Genitourinary: Negative for dysuria and flank pain.  Musculoskeletal: Negative for arthralgias, back pain, gait problem and neck pain.  Skin: Negative for color change.  Allergic/Immunologic: Negative for environmental allergies and food allergies.  Neurological: Negative for dizziness and headaches.  Hematological: Does not bruise/bleed easily.  Psychiatric/Behavioral: Negative for agitation, behavioral problems (depression) and hallucinations.   Vital Signs: BP (!) 144/72   Pulse 79   Resp 16   Ht 5\' 2"  (1.575 m)   Wt 106 lb 12.8 oz (48.4 kg)   SpO2 97%   BMI 19.53  kg/m    Physical Exam  Constitutional: She is oriented to person, place, and time. She appears well-developed and well-nourished. No distress.  HENT:  Head: Normocephalic and atraumatic.  Mouth/Throat: Oropharynx is clear and moist. No oropharyngeal exudate.  Eyes: Pupils are equal, round, and reactive to light. EOM are normal.  Neck: Normal range of motion. Neck supple. No JVD present. No tracheal deviation present. No thyromegaly present.  Cardiovascular: Normal rate, regular rhythm and normal heart sounds. Exam reveals no gallop and no friction rub.  No murmur heard. Pulmonary/Chest: Effort normal. No respiratory distress. She has no wheezes. She has no rales. She exhibits no tenderness.  Abdominal: Soft. Bowel sounds are normal.  Musculoskeletal: Normal range of motion.  Lymphadenopathy:    She has  no cervical adenopathy.  Neurological: She is alert and oriented to person, place, and time. No cranial nerve deficit. Coordination abnormal.  Extrapyramidal movements   Skin: Skin is warm and dry. She is not diaphoretic.  Psychiatric: She has a normal mood and affect. Her behavior is normal. Judgment and thought content normal.   Assessment/Plan: 1. Controlled type 2 diabetes mellitus with complication, with long-term current use of insulin (HCC) - Controlled per endo  2. Mild intermittent asthma without status asthmaticus without complication - Controlled   3. Parkinson's disease [facility]) - Per Neurology  4. Mixed Alzheimer's and vascular dementia - She is alert and oriented x 3, daughter is in the room with her, pt does not drive at this time   5. Other specified hypothyroidism  - Continue Synthroid 50 mcg qam   General Counseling: Jacqueline [name] verbalizes understanding of the findings of todays visit and agrees with plan of treatment. I have discussed any further diagnostic evaluation that may be needed or ordered today. We also reviewed her medications today. she has been encouraged to  call the office with any questions or concerns that should arise related to todays visit. Diabetes Counseling:  1. Addition of ACE inh/ ARB'S for nephroprotection. 2. Diabetic foot care, prevention of complications.  3.Exercise and lose weight.  4. Diabetic eye examination, 5. Monitor blood sugar closlely. nutrition counseling.  6.Sign and symptoms of hypoglycemia including shaking sweating,confusion and headaches.    Time spent:25 Minutes  Dr Lavera Guise Internal medicine

## 2017-09-27 DIAGNOSIS — H26491 Other secondary cataract, right eye: Secondary | ICD-10-CM | POA: Diagnosis not present

## 2017-09-28 DIAGNOSIS — E039 Hypothyroidism, unspecified: Secondary | ICD-10-CM | POA: Diagnosis not present

## 2017-09-28 DIAGNOSIS — R809 Proteinuria, unspecified: Secondary | ICD-10-CM | POA: Diagnosis not present

## 2017-09-28 DIAGNOSIS — E1142 Type 2 diabetes mellitus with diabetic polyneuropathy: Secondary | ICD-10-CM | POA: Diagnosis not present

## 2017-09-28 DIAGNOSIS — E1129 Type 2 diabetes mellitus with other diabetic kidney complication: Secondary | ICD-10-CM | POA: Diagnosis not present

## 2017-09-28 DIAGNOSIS — Z794 Long term (current) use of insulin: Secondary | ICD-10-CM | POA: Diagnosis not present

## 2017-09-28 DIAGNOSIS — E042 Nontoxic multinodular goiter: Secondary | ICD-10-CM | POA: Diagnosis not present

## 2017-09-28 DIAGNOSIS — E1159 Type 2 diabetes mellitus with other circulatory complications: Secondary | ICD-10-CM | POA: Diagnosis not present

## 2017-09-28 DIAGNOSIS — B351 Tinea unguium: Secondary | ICD-10-CM | POA: Diagnosis not present

## 2017-09-28 DIAGNOSIS — L851 Acquired keratosis [keratoderma] palmaris et plantaris: Secondary | ICD-10-CM | POA: Diagnosis not present

## 2017-09-28 DIAGNOSIS — I10 Essential (primary) hypertension: Secondary | ICD-10-CM | POA: Diagnosis not present

## 2017-10-06 ENCOUNTER — Ambulatory Visit: Payer: Medicare HMO | Attending: Urology

## 2017-10-06 ENCOUNTER — Other Ambulatory Visit: Payer: Self-pay

## 2017-10-06 DIAGNOSIS — R2689 Other abnormalities of gait and mobility: Secondary | ICD-10-CM | POA: Diagnosis not present

## 2017-10-06 DIAGNOSIS — M62838 Other muscle spasm: Secondary | ICD-10-CM | POA: Diagnosis not present

## 2017-10-06 DIAGNOSIS — R278 Other lack of coordination: Secondary | ICD-10-CM | POA: Diagnosis not present

## 2017-10-06 DIAGNOSIS — M6281 Muscle weakness (generalized): Secondary | ICD-10-CM | POA: Diagnosis not present

## 2017-10-06 DIAGNOSIS — N3942 Incontinence without sensory awareness: Secondary | ICD-10-CM | POA: Diagnosis present

## 2017-10-06 NOTE — Therapy (Signed)
[place] MAIN [facility] SERVICES [address] [place], [place], [address] Phone: [phone]   Fax:  [phone]  Physical Therapy Evaluation  Patient Details  Name: Jacqueline [name] MRN: [id number] Date of Birth: 20-Nov-1945 Referring Provider: Zara Council   Encounter Date: 10/06/2017  PT End of Session - 10/06/17 2034    Visit Number  1    Number of Visits  10    Date for PT Re-Evaluation  12/15/17    Authorization Type  Medicare    PT Start Time  1333    PT Stop Time  1438    PT Time Calculation (min)  65 min    Activity Tolerance  Patient tolerated treatment well    Behavior During Therapy  [facility] for tasks assessed/performed       Past Medical History:  Diagnosis Date  . Asthma   . Collapsed lung   . Diabetes mellitus without complication ([place])    Patient takes Insulin twice a day.   Marland Kitchen GERD (gastroesophageal reflux disease)   . Hyperlipidemia   . Hypertension   . Hypothyroidism   . Parkinson's disease ([place])   . Reactive airway disease     Past Surgical History:  Procedure Laterality Date  . ABDOMINAL HYSTERECTOMY    . CHOLECYSTECTOMY    . COLONOSCOPY WITH PROPOFOL N/A 03/29/2015   Procedure: COLONOSCOPY WITH PROPOFOL;  Surgeon: Hulen Luster, MD;  Location: [facility] ENDOSCOPY;  Service: Gastroenterology;  Laterality: N/A;  . ESOPHAGOGASTRODUODENOSCOPY (EGD) WITH PROPOFOL N/A 03/29/2015   Procedure: ESOPHAGOGASTRODUODENOSCOPY (EGD) WITH PROPOFOL;  Surgeon: Hulen Luster, MD;  Location: [facility] ENDOSCOPY;  Service: Gastroenterology;  Laterality: N/A;  . lung collapse     broken rib from fall punctured lung    There were no vitals filed for this visit.       [facility] PT Assessment - 10/06/17 0001      Assessment   Medical Diagnosis  Stress Incontinence, Female    Referring Provider  Zara Council    Onset Date/Surgical Date  10/06/16    Prior Therapy  PTNS      Precautions   Precautions  Fall    Precaution Comments  Parkinsons       Restrictions   Weight Bearing Restrictions  No      Balance Screen   Has the patient fallen in the past 6 months  No    Has the patient had a decrease in activity level because of a fear of falling?   No    Is the patient reluctant to leave their home because of a fear of falling?   No      Home Environment   Living Environment  Private residence    Living Arrangements  Alone    Available Help at Discharge  Friend(s) Konrad Dolores, friend and caregiver    Type of [place] to enter    Entrance Stairs-Number of Steps  2    Entrance Stairs-Rails  None    Home Layout  Two level    Alternate Level Stairs-Number of Steps  17     Alternate Level Stairs-Rails  Can reach both    [place] - standard;Tub bench;Cane - single point;Bedside commode    Additional Comments  -- Has a bed with a lift that helps her get in, out and a rail       Prior Function   Level of Independence  Independent;Needs assistance with transfers;Independent with basic ADLs Assistance getting out of bed and getting dressed. medicine    Vocation  Retired    Development worker, international aid for [employer]   Overall Cognitive Status  Within Functional Limits for tasks assessed    Attention  Focused;Sustained          Pelvic Floor Physical Therapy Evaluation and Assessment  SCREENING  Falls in last 6 mo: no    Patient's communication preference:   Red Flags: Have you had any night sweats? Yes, started in the last month, inconsistent Unexplained weight loss? Has lost weight over the last 5 years (60 lbs) but has recently put a few pounds back on Saddle anesthesia? no Unexplained changes in bowel or bladder habits? no  SUBJECTIVE  Patient reports: Started having leakage "several years ago" and she started getting "acupuncture" (PTNS) for several weeks but it did not work so now she is coming to PT.   Precautions:  Parkinson's,  diabetes  Social/Family/Vocational History:   Retired, lives alone, has care-givers  Recent Procedures/Tests/Findings:  PTNS  Obstetrical History: 3 Vaginal deliveries, some tearing.  Gynecological History: Hysterectomy because she was still having regular periods at 17. No fibroids/cysts  Urinary History: Urinary incontinence without sensory awareness during the day. At night she can sense that she needs to go but cannot get there fast enough. Has sensation "sometimes" Sometimes she can get to the bathroom in time, sometimes not.  Gastrointestinal History: Has had diarrhea since she had her gallbladder removed years ago. Sometimes has stool leakage, especially when she has looser stool. Lately this has become better.  Sexual activity/pain: No t active, no pian ever.  No pain other than some soreness when she "sleeps funny"  Patient Goals: Would like to stop having to wear depends.   OBJECTIVE  Posture/Observations:  Sitting: Writhing, parkinsonian movements forward head and shoulders.  Standing: R PSIS is more superficial, R inferior border of scapula winging, R shoulder posterior to L   Palpation/Segmental Motion/Joint Play:  Special tests:   Balance: ~ 3 second hold on each side for SLS on third attempt. [place]: no sacral mobility B Ober: positive B, painful on R  Range of Motion/Flexibilty:  Spine: not assessed Hips: extension to neutral B   Strength/MMT:  LE MMT  LE MMT Left Right  Hip flex:  (L2) 5/5 5/5  Hip ext: 5/5 5/5  Hip abd: 5/5 5/5  Hip add: 5/5 5/5  Hip IR 4+/5 4+/5  Hip ER 5/5 5/5   Myotome for lower quarter clear.   Abdominal:  Palpation: TTP through Psoas B, R>L Diastasis: not assessed, not noted with palpation  Pelvic Floor External Exam: Deferred until next visit Introitus Appears:  Skin integrity:  Palpation: Cough: Prolapse visible?: Scar mobility:  Internal Vaginal Exam: Deferres to next visit Strength (PERF):   Symmetry: Palpation: Prolapse:   Internal Rectal Exam: deferred indfinately Strength (PERF): Symmetry: Palpation: Prolapse:   Gait Analysis: unsteady, slow, and weaving.   Pelvic Floor Outcome Measures:  PFDI:55/300, PFIQ:147/300  INTERVENTIONS THIS SESSION: Self-care: Educated on the structure and function of the pelvic floor in relation to their symptoms as well as the POC, and initial HEP in order to set patient expectations and understanding from which we will build on in the future sessions. Educated on expectations and potential for slow progress and set backs due to parkinson's.          Objective measurements completed on  examination: See above findings.              PT Education - 10/06/17 2033    Education provided  Yes    Education Details  See Interventions this session    Person(s) Educated  Patient    Methods  Explanation    Comprehension  Verbalized understanding       PT Short Term Goals - 10/06/17 2050      PT SHORT TERM GOAL #1   Title  Patient will demonstrate a coordinated contraction, relaxation, and bulge of the pelvic floor muscles to demonstrate functional recruitment and motion and allow for further strengthening.    Time  5    Period  Weeks    Status  New    Target Date  11/10/17      PT SHORT TERM GOAL #2   Title  Patient will demonstrate HEP x1 in the clinic to demonstrate understanding and proper form to allow for further improvement.    Time  5    Period  Weeks    Status  New    Target Date  11/10/17      PT SHORT TERM GOAL #3   Title  Patient will demonstrate improved pelvic alignment and balance of musculature surrounding the pelvis to facilitate decreased PFM spasms and decrease Urinary Incontinence    Time  5    Period  Weeks    Status  New    Target Date  11/10/17        PT Long Term Goals - 10/06/17 2054      PT LONG TERM GOAL #1   Title  Patient will report no episodes of SUI over the course of the  prior two weeks to demonstrate improved functional ability.    Baseline  Having to wear depends all the time due to leaking when reaching up or at light    Time  10    Period  Weeks    Status  New    Target Date  12/15/17      PT LONG TERM GOAL #2   Title  Patient will score at or below 10/300 on the PFDI and 102/300 on the PFIQ to demonstrate a clinically meaningful decrease in disability and distress due to pelvic floor dysfunction.    Baseline  PFDI:55/300, PFIQ:147/300    Time  10    Period  Weeks    Status  New    Target Date  12/15/17      PT LONG TERM GOAL #3   Title  Patient will demonstrate ability to balance for 15 seconds on each leg to demonstrate improved core stability and balance     Baseline  3 second SLS B.    Time  10    Period  Weeks    Status  New    Target Date  12/15/17             Plan - 10/06/17 2039    Clinical Impression Statement  Pt. Is a 82 y/o female with cheif c/o urinary incontinence that has been going on for years with stress but has become worse over the last year, requiring use of depends. Pt has significant medical history for parkinsons and diabetes. Clinical exam revealed poor balance, good lower extremity strength, hyperkyphotic posture, writhing movements, pelvic obliquity, spasms of hip musculature, and poor spinal alignment and proximal musculature stability. Pt. will benefit from skilled PT aimed at addressing these defecits and continuing to assess  for and address further contributing factors that may be leasing to symptoms. Despite Pts advanced parkinsons, she is fairly active and independent.     History and Personal Factors relevant to plan of care:  Parkinson's and diabetes    Clinical Presentation  Evolving    Clinical Presentation due to:  variability of parkinson's presentation with timing of medication, Pt was nearing time for more medicine when seen today    Clinical Decision Making  High    Rehab Potential  Fair    Clinical  Impairments Affecting Rehab Potential  Parkinson's, failed PTNS, diabetes    PT Frequency  1x / week    PT Duration  Other (comment) 10 weeks    PT Treatment/Interventions  ADLs/Self Care Home Management;Aquatic Therapy;Biofeedback;Electrical Stimulation;Gait training;Stair training;Functional mobility training;Therapeutic activities;Therapeutic exercise;Balance training;Patient/family education;Neuromuscular re-education;Manual techniques;Scar mobilization;Taping;Dry needling;Passive range of motion    PT Next Visit Plan  Internal assessment, spine ROM assessment, give exercises    Consulted and Agree with Plan of Care  Patient;Family member/caregiver    Family Member Consulted  Konrad Dolores        Patient will benefit from skilled therapeutic intervention in order to improve the following deficits and impairments:  Abnormal gait, Increased fascial restricitons, Improper body mechanics, Decreased coordination, Decreased mobility, Increased muscle spasms, Impaired tone, Postural dysfunction, Decreased activity tolerance, Decreased endurance, Decreased range of motion, Decreased strength, Hypomobility, Decreased balance, Difficulty walking, Impaired flexibility  Visit Diagnosis: Other lack of coordination  Muscle weakness (generalized)  Other muscle spasm  Balance disorder  Urinary incontinence without sensory awareness     Problem List Patient Active Problem List   Diagnosis Date Noted  . Asthma without status asthmaticus 07/08/2016  . Hyperlipidemia, unspecified 07/08/2016  . Dysphagia 01/07/2016  . Unsteadiness 10/01/2015  . Type 2 diabetes mellitus without complication, without long-term current use of insulin ([place]) 08/07/2015  . Mixed Alzheimer's and vascular dementia 08/06/2015  . Controlled type 2 diabetes mellitus without complication, without long-term current use of insulin ([place]) 02/12/2015  . Diabetes type 2, controlled ([place]) 06/19/2014  . Essential hypertension  03/08/2014  . Parkinson's disease ([place]) 01/16/2014  . Fatigue 11/09/2013  . Diabetes mellitus, type II ([place]) 09/15/2013  . Difficulty walking 09/15/2013  . Dizziness 09/15/2013   Willa Rough DPT, ATC Willa Rough 10/06/2017, 9:01 PM  [place] MAIN [facility] SERVICES [address] [place], [place], [address] Phone: [phone]   Fax:  [phone]  Name: CHIANTE PEDEN MRN: [id number] Date of Birth: [birth year]

## 2017-10-20 ENCOUNTER — Ambulatory Visit: Payer: Medicare HMO

## 2017-10-20 DIAGNOSIS — R2689 Other abnormalities of gait and mobility: Secondary | ICD-10-CM | POA: Diagnosis not present

## 2017-10-20 DIAGNOSIS — R278 Other lack of coordination: Secondary | ICD-10-CM

## 2017-10-20 DIAGNOSIS — M62838 Other muscle spasm: Secondary | ICD-10-CM | POA: Diagnosis not present

## 2017-10-20 DIAGNOSIS — M6281 Muscle weakness (generalized): Secondary | ICD-10-CM | POA: Diagnosis not present

## 2017-10-20 DIAGNOSIS — N3942 Incontinence without sensory awareness: Secondary | ICD-10-CM | POA: Diagnosis not present

## 2017-10-20 NOTE — Therapy (Signed)
[place] MAIN [facility] SERVICES [address] [place], [place], [address] Phone: [phone]   Fax:  [phone]  Physical Therapy Treatment  Patient Details  Name: Jacqueline [name] MRN: [id number] Date of Birth: 1936/05/17 Referring Provider: Zara Council   Encounter Date: 10/20/2017  PT End of Session - 10/20/17 1515    Visit Number  2    Number of Visits  10    Date for PT Re-Evaluation  12/15/17    Authorization Type  Medicare    PT Start Time  1340    PT Stop Time  1448    PT Time Calculation (min)  68 min    Activity Tolerance  Patient tolerated treatment well    Behavior During Therapy  [facility] for tasks assessed/performed       Past Medical History:  Diagnosis Date  . Asthma   . Collapsed lung   . Diabetes mellitus without complication ([place])    Patient takes Insulin twice a day.   Marland Kitchen GERD (gastroesophageal reflux disease)   . Hyperlipidemia   . Hypertension   . Hypothyroidism   . Parkinson's disease ([place])   . Reactive airway disease     Past Surgical History:  Procedure Laterality Date  . ABDOMINAL HYSTERECTOMY    . CHOLECYSTECTOMY    . COLONOSCOPY WITH PROPOFOL N/A 03/29/2015   Procedure: COLONOSCOPY WITH PROPOFOL;  Surgeon: Hulen Luster, MD;  Location: [facility] ENDOSCOPY;  Service: Gastroenterology;  Laterality: N/A;  . ESOPHAGOGASTRODUODENOSCOPY (EGD) WITH PROPOFOL N/A 03/29/2015   Procedure: ESOPHAGOGASTRODUODENOSCOPY (EGD) WITH PROPOFOL;  Surgeon: Hulen Luster, MD;  Location: [facility] ENDOSCOPY;  Service: Gastroenterology;  Laterality: N/A;  . lung collapse     broken rib from fall punctured lung    There were no vitals filed for this visit.    Pelvic Floor Physical Therapy Treatment Note  SCREENING  Changes in medications, allergies, or medical history?:no     SUBJECTIVE  Patient reports: She fell today, trying to get up quickly to answer the door for the people who were there to work on her sliding glass door. She  says she thinks she missed her arm rest with her hand and it put her off balance. It caused skin tearing of the L knee and bruising on the L hip.  Precautions:  Parkinson's disease, Falls  Pain update:  Pain in the knee where she fell is at ~ 2/10 after taking some aleve.   Patient Goals: Would like to stop having to wear depends.    OBJECTIVE  Changes in: Posture/Observations:  Greater movements when pain is high and when pelvic exam performed.   Range of Motion/Flexibilty:  Decreased mobility of sacrum with pain on R base with pressure. Tightness in hamstrings  *Improved mobility an decreased pain following treatment   Pelvic floor: External Exam:  Introitus Appears: gaping Skin integrity: normal Palpation: no TTP Cough: paradoxical Prolapse visible?: yes, above introitus Scar mobility: N/A  Internal Vaginal Exam:  Strength (PERF): 3/5, 5 seconds 2 times Symmetry: L>R for tenderness/spasm Palpation: TTP through all muscles B Prolapse: to ~ 1 cm above introitus with bearing down.   INTERVENTIONS THIS SESSION: Manual: assessed PFM for further POC development and planning. Performed grade 2-4 PA mobs to sacrum for improved mobility and decreased pressure on nerves innervating the pelvis.  NM re-ed: educated Pt. On proper kegel performance without accessory use of glutes and with diaphragmatic breathing pattern. Educated on pelvic neutral in sitting and standing to allow  for optimal function of the PFM. Therex: educated on and practiced seated hamstring stretch to decrease tension pulling Pt. Into posterior pelvic tilt and improve lumbar mobility.  Total time: 68 min.                         PT Education - 10/20/17 1514    Education provided  Yes    Education Details  See Pt. Instructions and Interventions this session    Person(s) Educated  Patient;Caregiver(s)    Methods  Explanation;Demonstration;Handout;Verbal cues    Comprehension   Verbalized understanding;Returned demonstration;Verbal cues required       PT Short Term Goals - 10/06/17 2050      PT SHORT TERM GOAL #1   Title  Patient will demonstrate a coordinated contraction, relaxation, and bulge of the pelvic floor muscles to demonstrate functional recruitment and motion and allow for further strengthening.    Time  5    Period  Weeks    Status  New    Target Date  11/10/17      PT SHORT TERM GOAL #2   Title  Patient will demonstrate HEP x1 in the clinic to demonstrate understanding and proper form to allow for further improvement.    Time  5    Period  Weeks    Status  New    Target Date  11/10/17      PT SHORT TERM GOAL #3   Title  Patient will demonstrate improved pelvic alignment and balance of musculature surrounding the pelvis to facilitate decreased PFM spasms and decrease Urinary Incontinence    Time  5    Period  Weeks    Status  New    Target Date  11/10/17        PT Long Term Goals - 10/06/17 2054      PT LONG TERM GOAL #1   Title  Patient will report no episodes of SUI over the course of the prior two weeks to demonstrate improved functional ability.    Baseline  Having to wear depends all the time due to leaking when reaching up or at light    Time  10    Period  Weeks    Status  New    Target Date  12/15/17      PT LONG TERM GOAL #2   Title  Patient will score at or below 10/300 on the PFDI and 102/300 on the PFIQ to demonstrate a clinically meaningful decrease in disability and distress due to pelvic floor dysfunction.    Baseline  PFDI:55/300, PFIQ:147/300    Time  10    Period  Weeks    Status  New    Target Date  12/15/17      PT LONG TERM GOAL #3   Title  Patient will demonstrate ability to balance for 15 seconds on each leg to demonstrate improved core stability and balance     Baseline  3 second SLS B.    Time  10    Period  Weeks    Status  New    Target Date  12/15/17            Plan - 10/20/17 1516     Clinical Impression Statement  Pt. responded well to all treatment today, demonstrating impoved sacral mobility and understanding of all education provided today. She was at a valley for performance based on her parkinson's medication and had trouble with coordination partially due  to uncontrolled movements but was able to demonstrate understanding and correct performance ~ 50 % of the time, she will benefit from review. Continue per POC.    Clinical Presentation  Evolving    Clinical Decision Making  High    Rehab Potential  Fair    Clinical Impairments Affecting Rehab Potential  Parkinson's, failed PTNS, diabetes    PT Frequency  1x / week    PT Duration  Other (comment)    PT Treatment/Interventions  ADLs/Self Care Home Management;Aquatic Therapy;Biofeedback;Electrical Stimulation;Gait training;Stair training;Functional mobility training;Therapeutic activities;Therapeutic exercise;Balance training;Patient/family education;Neuromuscular re-education;Manual techniques;Scar mobilization;Taping;Dry needling;Passive range of motion    PT Next Visit Plan  TP release surrounding low back/hips, PA to spine, childs pose, cobra pose    PT Home Exercise Plan  Kegels (5 second holds), hamstring stretch    Consulted and Agree with Plan of Care  Patient;Family member/caregiver    Family Member Consulted  Konrad Dolores        Patient will benefit from skilled therapeutic intervention in order to improve the following deficits and impairments:  Abnormal gait, Increased fascial restricitons, Improper body mechanics, Decreased coordination, Decreased mobility, Increased muscle spasms, Impaired tone, Postural dysfunction, Decreased activity tolerance, Decreased endurance, Decreased range of motion, Decreased strength, Hypomobility, Decreased balance, Difficulty walking, Impaired flexibility  Visit Diagnosis: Other lack of coordination  Muscle weakness (generalized)  Other muscle spasm  Balance  disorder  Urinary incontinence without sensory awareness     Problem List Patient Active Problem List   Diagnosis Date Noted  . Asthma without status asthmaticus 07/08/2016  . Hyperlipidemia, unspecified 07/08/2016  . Dysphagia 01/07/2016  . Unsteadiness 10/01/2015  . Type 2 diabetes mellitus without complication, without long-term current use of insulin ([place]) 08/07/2015  . Mixed Alzheimer's and vascular dementia 08/06/2015  . Controlled type 2 diabetes mellitus without complication, without long-term current use of insulin ([place]) 02/12/2015  . Diabetes type 2, controlled ([place]) 06/19/2014  . Essential hypertension 03/08/2014  . Parkinson's disease ([place]) 01/16/2014  . Fatigue 11/09/2013  . Diabetes mellitus, type II ([place]) 09/15/2013  . Difficulty walking 09/15/2013  . Dizziness 09/15/2013   Willa Rough DPT, ATC Willa Rough 10/20/2017, 3:23 PM  [place] MAIN [facility] SERVICES [address] [place], [place], [address] Phone: [phone]   Fax:  [phone]  Name: MODENA BELLEMARE MRN: [id number] Date of Birth: 1936-04-09

## 2017-10-20 NOTE — Patient Instructions (Signed)
Kegel exercises:    With neutral spine, inhale and relax the pelvic floor muscles and then exhale and tighten pelvic floor by imagining you are stopping the flow of urine, squeezing only around the vagina and anus.  Quick-Flicks: Pull up and in quickly and then relax allowing just enough time for the muscles to full lengthen before the next contraction. Do _10__ repetitions in a row, stopping if the pelvic floor muscle gets tired and other muscles try to take over.  Long-Holds: Hold for _~5__ seconds and then fully release, repeat _5__ times.   Repeat both of these exercises _2-3__ times throughout the day       Keep spine tall, hinge forward from the hips. Hold for ~ 30 seconds (5 deep breaths) and repeat 2-3 times on each side.

## 2017-10-27 ENCOUNTER — Ambulatory Visit: Payer: Medicare HMO

## 2017-10-27 DIAGNOSIS — R278 Other lack of coordination: Secondary | ICD-10-CM

## 2017-10-27 DIAGNOSIS — R2689 Other abnormalities of gait and mobility: Secondary | ICD-10-CM

## 2017-10-27 DIAGNOSIS — N3942 Incontinence without sensory awareness: Secondary | ICD-10-CM | POA: Diagnosis not present

## 2017-10-27 DIAGNOSIS — M6281 Muscle weakness (generalized): Secondary | ICD-10-CM | POA: Diagnosis not present

## 2017-10-27 DIAGNOSIS — M62838 Other muscle spasm: Secondary | ICD-10-CM | POA: Diagnosis not present

## 2017-10-27 NOTE — Therapy (Signed)
[place] MAIN [facility] SERVICES [address] [place], [place], [address] Phone: [phone]   Fax:  [phone]  Physical Therapy Treatment  Patient Details  Name: Jacqueline [name] MRN: [id number] Date of Birth: 03-26-1936 Referring Provider: Zara Council   Encounter Date: 10/27/2017  PT End of Session - 10/27/17 1525    Visit Number  3    Number of Visits  10    Date for PT Re-Evaluation  12/15/17    Authorization Type  Medicare    PT Start Time  1341    PT Stop Time  1446    PT Time Calculation (min)  65 min    Activity Tolerance  Patient tolerated treatment well    Behavior During Therapy  [employer] for tasks assessed/performed       Past Medical History:  Diagnosis Date  . Asthma   . Collapsed lung   . Diabetes mellitus without complication ([place])    Patient takes Insulin twice a day.   Marland Kitchen GERD (gastroesophageal reflux disease)   . Hyperlipidemia   . Hypertension   . Hypothyroidism   . Parkinson's disease ([place])   . Reactive airway disease     Past Surgical History:  Procedure Laterality Date  . ABDOMINAL HYSTERECTOMY    . CHOLECYSTECTOMY    . COLONOSCOPY WITH PROPOFOL N/A 03/29/2015   Procedure: COLONOSCOPY WITH PROPOFOL;  Surgeon: Hulen Luster, MD;  Location: [facility] ENDOSCOPY;  Service: Gastroenterology;  Laterality: N/A;  . ESOPHAGOGASTRODUODENOSCOPY (EGD) WITH PROPOFOL N/A 03/29/2015   Procedure: ESOPHAGOGASTRODUODENOSCOPY (EGD) WITH PROPOFOL;  Surgeon: Hulen Luster, MD;  Location: [facility] ENDOSCOPY;  Service: Gastroenterology;  Laterality: N/A;  . lung collapse     broken rib from fall punctured lung    There were no vitals filed for this visit.    Pelvic Floor Physical Therapy Treatment Note  SCREENING  Changes in medications, allergies, or medical history?: no   SUBJECTIVE  Patient reports: She does not think she is doing exercise right, has not gotten to the hamstring stretch because she was very focused on the other  exercise and having a hard time with it.  Precautions:  Parkinson's  Pain update:  Pt. Made no c/o pain.  Patient Goals: Would like to stop having to wear depends.    OBJECTIVE  Changes in: Posture/Observations:  Forward head and shoulders, choosing not to use cane to walk in.  Range of Motion/Flexibilty:  Decreased spinal mobility throughout, attempted PA through thoracolumbar junction but only mildly successful due to writhing movements from parkinson's worsened with pain.  Abdominal:  Decreased TA activation with exhale and kegel/tuck.   INTERVENTIONS THIS SESSION: Manual: Performed TP release to Psoas and QL B to decrease spasm and improve neural flow through nerve roots leading to the abdomen and pelvic floor to facilitate improved coordination training. NM re-ed: reviewed Diaphragmatic breathing in hook-lying and added pelvic tilts and kegels on step-wise to improve coordination and increase strength for decreased urinary incontinence. Therex: reviewed hamstring stretch in seated and demonstrated passive HS stretch for friend/caregiver to help with.  Total time: 65 min.                          PT Education - 10/27/17 1524    Education provided  Yes    Education Details  See Pt. Instructions and Interventions this session.    Person(s) Educated  Patient;Caregiver(s)    Methods  Explanation;Demonstration;Handout;Tactile cues;Verbal cues  Comprehension  Verbalized understanding;Returned demonstration;Verbal cues required;Tactile cues required;Need further instruction       PT Short Term Goals - 10/06/17 2050      PT SHORT TERM GOAL #1   Title  Patient will demonstrate a coordinated contraction, relaxation, and bulge of the pelvic floor muscles to demonstrate functional recruitment and motion and allow for further strengthening.    Time  5    Period  Weeks    Status  New    Target Date  11/10/17      PT SHORT TERM GOAL #2   Title   Patient will demonstrate HEP x1 in the clinic to demonstrate understanding and proper form to allow for further improvement.    Time  5    Period  Weeks    Status  New    Target Date  11/10/17      PT SHORT TERM GOAL #3   Title  Patient will demonstrate improved pelvic alignment and balance of musculature surrounding the pelvis to facilitate decreased PFM spasms and decrease Urinary Incontinence    Time  5    Period  Weeks    Status  New    Target Date  11/10/17        PT Long Term Goals - 10/06/17 2054      PT LONG TERM GOAL #1   Title  Patient will report no episodes of SUI over the course of the prior two weeks to demonstrate improved functional ability.    Baseline  Having to wear depends all the time due to leaking when reaching up or at light    Time  10    Period  Weeks    Status  New    Target Date  12/15/17      PT LONG TERM GOAL #2   Title  Patient will score at or below 10/300 on the PFDI and 102/300 on the PFIQ to demonstrate a clinically meaningful decrease in disability and distress due to pelvic floor dysfunction.    Baseline  PFDI:55/300, PFIQ:147/300    Time  10    Period  Weeks    Status  New    Target Date  12/15/17      PT LONG TERM GOAL #3   Title  Patient will demonstrate ability to balance for 15 seconds on each leg to demonstrate improved core stability and balance     Baseline  3 second SLS B.    Time  10    Period  Weeks    Status  New    Target Date  12/15/17            Plan - 10/27/17 1525    Clinical Impression Statement  Pt. responded well to all interventions today, demonstrating improved ability to recruit PFM and diaphragm following manual treatment and able to coordinate all three components of tilt, kegel, breathing by end of session in supine and seated with mod cueing. She will likely need review however. Continue per POC.    Clinical Presentation  Evolving    Clinical Decision Making  High    Rehab Potential  Fair    Clinical  Impairments Affecting Rehab Potential  Parkinson's, failed PTNS, diabetes    PT Frequency  1x / week    PT Duration  Other (comment)    PT Treatment/Interventions  ADLs/Self Care Home Management;Aquatic Therapy;Biofeedback;Electrical Stimulation;Gait training;Stair training;Functional mobility training;Therapeutic activities;Therapeutic exercise;Balance training;Patient/family education;Neuromuscular re-education;Manual techniques;Scar mobilization;Taping;Dry needling;Passive range of motion  PT Next Visit Plan  TP release surrounding low back/hips, PA to spine, childs pose, cobra pose    PT Home Exercise Plan  Kegels (5 second holds), hamstring stretch, supine pelvic tilts with brething and kegel.    Consulted and Agree with Plan of Care  Patient;Family member/caregiver    Family Member Consulted  Konrad Dolores        Patient will benefit from skilled therapeutic intervention in order to improve the following deficits and impairments:  Abnormal gait, Increased fascial restricitons, Improper body mechanics, Decreased coordination, Decreased mobility, Increased muscle spasms, Impaired tone, Postural dysfunction, Decreased activity tolerance, Decreased endurance, Decreased range of motion, Decreased strength, Hypomobility, Decreased balance, Difficulty walking, Impaired flexibility  Visit Diagnosis: Other lack of coordination  Muscle weakness (generalized)  Other muscle spasm  Balance disorder  Urinary incontinence without sensory awareness     Problem List Patient Active Problem List   Diagnosis Date Noted  . Asthma without status asthmaticus 07/08/2016  . Hyperlipidemia, unspecified 07/08/2016  . Dysphagia 01/07/2016  . Unsteadiness 10/01/2015  . Type 2 diabetes mellitus without complication, without long-term current use of insulin ([place]) 08/07/2015  . Mixed Alzheimer's and vascular dementia 08/06/2015  . Controlled type 2 diabetes mellitus without complication, without  long-term current use of insulin ([place]) 02/12/2015  . Diabetes type 2, controlled ([place]) 06/19/2014  . Essential hypertension 03/08/2014  . Parkinson's disease ([place]) 01/16/2014  . Fatigue 11/09/2013  . Diabetes mellitus, type II ([place]) 09/15/2013  . Difficulty walking 09/15/2013  . Dizziness 09/15/2013   Willa Rough DPT, ATC Willa Rough 10/27/2017, 3:29 PM  [place] MAIN [facility] SERVICES [address] [place], [place], [address] Phone: [phone]   Fax:  [phone]  Name: Jacqueline [name] MRN: [id number] Date of Birth: 03-Jan-1936

## 2017-10-27 NOTE — Patient Instructions (Addendum)
Getting In/out of bed     Lying on back, bend left knee and place left arm across chest. Roll all in one movement to the right. Reverse to roll to the left. Always move as one unit.     Once you are lying on you side, move legs to edge of bed. Pull in the pelvic floor and lower tummy and push down with both hands while moving legs off bed to reach sitting position.  *Reverse sequence to return to lying down.  Copyright  VHI. All rights reserved.    ** Use head of bed once already on your side to help you push up when strength is low.   Stabilization: Diaphragmatic Breathing    Lie with knees bent, feet flat. Place one hand on stomach, other on chest. Breathe deeply through nose, lifting belly hand without any motion of hand on chest. Repeat _10___ times per set. Do __3__ sets per session. Do _1-2_times per day.   Pelvic Tilt With Pelvic Floor (Hook-Lying)        Lie with hips and knees bent. Squeeze pelvic floor and flatten low back while breathing out so that pelvis tilts. Repeat __10x3_ times. Do _1-2__ times a day.       Start with just the tilts "Rock back and roll under" and then layer the breathing on top and finally add the kegel (pelvic floor squeeze)

## 2017-11-03 ENCOUNTER — Ambulatory Visit: Payer: Medicare HMO

## 2017-11-03 DIAGNOSIS — M62838 Other muscle spasm: Secondary | ICD-10-CM | POA: Diagnosis not present

## 2017-11-03 DIAGNOSIS — M6281 Muscle weakness (generalized): Secondary | ICD-10-CM

## 2017-11-03 DIAGNOSIS — N3942 Incontinence without sensory awareness: Secondary | ICD-10-CM

## 2017-11-03 DIAGNOSIS — R2689 Other abnormalities of gait and mobility: Secondary | ICD-10-CM | POA: Diagnosis not present

## 2017-11-03 DIAGNOSIS — R278 Other lack of coordination: Secondary | ICD-10-CM

## 2017-11-03 NOTE — Therapy (Signed)
[place] MAIN [facility] SERVICES [address] [place], [place], [address] Phone: [phone]   Fax:  [phone]  Wound Care Evaluation  Patient Details  Name: Jacqueline [name] MRN: [id number] Date of Birth: 1935/10/17 Referring Provider: Zara Council   Encounter Date: 11/03/2017  PT End of Session - 11/03/17 1831    Visit Number  4    Number of Visits  10    Date for PT Re-Evaluation  12/15/17    Authorization Type  Medicare    PT Start Time  1336    PT Stop Time  1434    PT Time Calculation (min)  58 min    Activity Tolerance  Patient tolerated treatment well    Behavior During Therapy  [facility] for tasks assessed/performed       Past Medical History:  Diagnosis Date  . Asthma   . Collapsed lung   . Diabetes mellitus without complication ([place])    Patient takes Insulin twice a day.   Marland Kitchen GERD (gastroesophageal reflux disease)   . Hyperlipidemia   . Hypertension   . Hypothyroidism   . Parkinson's disease ([place])   . Reactive airway disease     Past Surgical History:  Procedure Laterality Date  . ABDOMINAL HYSTERECTOMY    . CHOLECYSTECTOMY    . COLONOSCOPY WITH PROPOFOL N/A 03/29/2015   Procedure: COLONOSCOPY WITH PROPOFOL;  Surgeon: Hulen Luster, MD;  Location: [facility] ENDOSCOPY;  Service: Gastroenterology;  Laterality: N/A;  . ESOPHAGOGASTRODUODENOSCOPY (EGD) WITH PROPOFOL N/A 03/29/2015   Procedure: ESOPHAGOGASTRODUODENOSCOPY (EGD) WITH PROPOFOL;  Surgeon: Hulen Luster, MD;  Location: [facility] ENDOSCOPY;  Service: Gastroenterology;  Laterality: N/A;  . lung collapse     broken rib from fall punctured lung    There were no vitals filed for this visit.     Pelvic Floor Physical Therapy Treatment Note  SCREENING  Changes in medications, allergies, or medical history?: no   SUBJECTIVE  Patient reports: She is practicing, having some difficulties getting in and out of bed. Feels confident that she is doing the "roll" part but is not sure  about the breathing and squeezes yet. Has been doing "stretches" for her leg with the leg off of the ground.  Precautions:  Parkinson's  Pain update:  No pain  Patient Goals: Would like to stop having to wear depends.    OBJECTIVE  Changes in: Posture/Observations:  Patient is doing knee extensions/ hip flexion rather than hamstring stretch.   Range of Motion/Flexibilty:  In prone quad stretch position, hip elevates at ~ 95 deg. Flexion prior to hold-relax and at ~ 125 following.  Palpation: TTP through L QL and iliacus, decreased following TP release  INTERVENTIONS THIS SESSION: Therex: reviewed hamstring stretch, educated on and practiced prone quad stretch with hold-relax, childs pose, and seated side-stretch as well as prone press-ups to help decrease tension around the pelvis, improve hip extension and lumbar extension ROM, and take pressure off of nerves entering the pelvis for improved PFM function.  Manual: performed TP release to L QL and iliacus to decrease pain and spasm putting pressure on the nerves that innervate the pelvis.   Total time: 58 min.           Objective measurements completed on examination: See above findings.            PT Education - 11/03/17 1831    Education provided  Yes    Education Details  See Pt. Instructions and Interventions this  session    Person(s) Educated  Patient;Caregiver(s)    Methods  Explanation;Demonstration;Tactile cues;Verbal cues;Handout    Comprehension  Verbalized understanding;Returned demonstration;Verbal cues required;Tactile cues required;Need further instruction       PT Short Term Goals - 10/06/17 2050      PT SHORT TERM GOAL #1   Title  Patient will demonstrate a coordinated contraction, relaxation, and bulge of the pelvic floor muscles to demonstrate functional recruitment and motion and allow for further strengthening.    Time  5    Period  Weeks    Status  New    Target Date  11/10/17       PT SHORT TERM GOAL #2   Title  Patient will demonstrate HEP x1 in the clinic to demonstrate understanding and proper form to allow for further improvement.    Time  5    Period  Weeks    Status  New    Target Date  11/10/17      PT SHORT TERM GOAL #3   Title  Patient will demonstrate improved pelvic alignment and balance of musculature surrounding the pelvis to facilitate decreased PFM spasms and decrease Urinary Incontinence    Time  5    Period  Weeks    Status  New    Target Date  11/10/17        PT Long Term Goals - 10/06/17 2054      PT LONG TERM GOAL #1   Title  Patient will report no episodes of SUI over the course of the prior two weeks to demonstrate improved functional ability.    Baseline  Having to wear depends all the time due to leaking when reaching up or at light    Time  10    Period  Weeks    Status  New    Target Date  12/15/17      PT LONG TERM GOAL #2   Title  Patient will score at or below 10/300 on the PFDI and 102/300 on the PFIQ to demonstrate a clinically meaningful decrease in disability and distress due to pelvic floor dysfunction.    Baseline  PFDI:55/300, PFIQ:147/300    Time  10    Period  Weeks    Status  New    Target Date  12/15/17      PT LONG TERM GOAL #3   Title  Patient will demonstrate ability to balance for 15 seconds on each leg to demonstrate improved core stability and balance     Baseline  3 second SLS B.    Time  10    Period  Weeks    Status  New    Target Date  12/15/17           Plan - 11/03/17 1832    Clinical Impression Statement  Pt. responded well to all interventions today, demonstrating increased knee flexion ROM and decreased L hip flexor and QL spasm as well as understanding of all education provided. Continue per POC.     Clinical Presentation  Evolving    Clinical Decision Making  High    Rehab Potential  Fair    Clinical Impairments Affecting Rehab Potential  Parkinson's, failed PTNS, diabetes     PT Frequency  1x / week    PT Duration  Other (comment)    PT Treatment/Interventions  ADLs/Self Care Home Management;Aquatic Therapy;Biofeedback;Electrical Stimulation;Gait training;Stair training;Functional mobility training;Therapeutic activities;Therapeutic exercise;Balance training;Patient/family education;Neuromuscular re-education;Manual techniques;Scar mobilization;Taping;Dry needling;Passive range of motion  PT Next Visit Plan  review pelvic tilts with breathing and "lengthening kegels" TP release and mobilization of inner thigh and training on sit-to-stand with kegel and urge-suppression technique.    PT [place] (5 second holds), hamstring stretch, supine pelvic tilts with brething and kegel.    Consulted and Agree with Plan of Care  Patient;Family member/caregiver    Family Member Consulted  Konrad Dolores        Patient will benefit from skilled therapeutic intervention in order to improve the following deficits and impairments:  Abnormal gait, Increased fascial restricitons, Improper body mechanics, Decreased coordination, Decreased mobility, Increased muscle spasms, Impaired tone, Postural dysfunction, Decreased activity tolerance, Decreased endurance, Decreased range of motion, Decreased strength, Hypomobility, Decreased balance, Difficulty walking, Impaired flexibility  Visit Diagnosis: Other lack of coordination  Muscle weakness (generalized)  Other muscle spasm  Balance disorder  Urinary incontinence without sensory awareness    Problem List Patient Active Problem List   Diagnosis Date Noted  . Asthma without status asthmaticus 07/08/2016  . Hyperlipidemia, unspecified 07/08/2016  . Dysphagia 01/07/2016  . Unsteadiness 10/01/2015  . Type 2 diabetes mellitus without complication, without long-term current use of insulin ([place]) 08/07/2015  . Mixed Alzheimer's and vascular dementia 08/06/2015  . Controlled type 2 diabetes mellitus without  complication, without long-term current use of insulin ([place]) 02/12/2015  . Diabetes type 2, controlled ([place]) 06/19/2014  . Essential hypertension 03/08/2014  . Parkinson's disease ([place]) 01/16/2014  . Fatigue 11/09/2013  . Diabetes mellitus, type II ([place]) 09/15/2013  . Difficulty walking 09/15/2013  . Dizziness 09/15/2013   Willa Rough DPT, ATC Willa Rough 11/03/2017, 6:40 PM  [place] MAIN [facility] SERVICES [address] [place], [place], [address] Phone: [phone]   Fax:  [phone]  Name: Jacqueline [name] MRN: [id number] Date of Birth: 12-Feb-1936

## 2017-11-03 NOTE — Patient Instructions (Signed)
Child's Pose Pelvic Floor Lengthening    Sit in knee-chest position and reach arms forward. Separate knees for comfort. Hold position for _5__ breaths. Repeat _2-3__ times. Do _1-2__ times per day.     In-between sets of the child's pose press up into this pose and do 5 deep breaths in with heavy sighs "moody teenager" to improve your low back motion.     Press ankle into the strap with resistance through your hands and hold for 5 seconds, release and take up the slack in the strap then repeat 4 more times on each side. (5 times for 5 seconds) then hold for another 5 deep breaths to get a good stretch.     Hold for 30 seconds (5 deep breaths) and repeat 2-3 times on each side once a day

## 2017-11-10 ENCOUNTER — Ambulatory Visit: Payer: Medicare HMO | Attending: Urology

## 2017-11-10 DIAGNOSIS — M62838 Other muscle spasm: Secondary | ICD-10-CM | POA: Diagnosis not present

## 2017-11-10 DIAGNOSIS — R278 Other lack of coordination: Secondary | ICD-10-CM | POA: Insufficient documentation

## 2017-11-10 DIAGNOSIS — R2689 Other abnormalities of gait and mobility: Secondary | ICD-10-CM

## 2017-11-10 DIAGNOSIS — M6281 Muscle weakness (generalized): Secondary | ICD-10-CM | POA: Diagnosis not present

## 2017-11-10 DIAGNOSIS — N3942 Incontinence without sensory awareness: Secondary | ICD-10-CM | POA: Insufficient documentation

## 2017-11-10 NOTE — Patient Instructions (Signed)
   Breathe out as you lift your chest and arms up off of the floor/bed and squeeze your shoulder blades together. Do 2 sets of 10. Once a day.   2) SLOW DOWN! On the extensions over the towel roll and breathe out as you extend and do these before you do the above exercise.

## 2017-11-10 NOTE — Therapy (Signed)
[place] MAIN [facility] SERVICES [address] [place], [place], [address] Phone: [phone]   Fax:  [phone]  Physical Therapy Treatment  Patient Details  Name: Jacqueline [name] MRN: [id number] Date of Birth: 1936/02/20 Referring Provider: Zara Council   Encounter Date: 11/10/2017    Past Medical History:  Diagnosis Date  . Asthma   . Collapsed lung   . Diabetes mellitus without complication ([place])    Patient takes Insulin twice a day.   Marland Kitchen GERD (gastroesophageal reflux disease)   . Hyperlipidemia   . Hypertension   . Hypothyroidism   . Parkinson's disease ([place])   . Reactive airway disease     Past Surgical History:  Procedure Laterality Date  . ABDOMINAL HYSTERECTOMY    . CHOLECYSTECTOMY    . COLONOSCOPY WITH PROPOFOL N/A 03/29/2015   Procedure: COLONOSCOPY WITH PROPOFOL;  Surgeon: Hulen Luster, MD;  Location: [facility] ENDOSCOPY;  Service: Gastroenterology;  Laterality: N/A;  . ESOPHAGOGASTRODUODENOSCOPY (EGD) WITH PROPOFOL N/A 03/29/2015   Procedure: ESOPHAGOGASTRODUODENOSCOPY (EGD) WITH PROPOFOL;  Surgeon: Hulen Luster, MD;  Location: [facility] ENDOSCOPY;  Service: Gastroenterology;  Laterality: N/A;  . lung collapse     broken rib from fall punctured lung    There were no vitals filed for this visit.    Pelvic Floor Physical Therapy Treatment Note  SCREENING  Changes in medications, allergies, or medical history?: no   SUBJECTIVE  Patient reports: Had a hard time getting out of bed Monday morning so she lost some sleep, felt less energy yesterday but is feeling good today.   Precautions:  Parkinson's  Pain update:  Location of pain: L anterior hip Current pain:  4/10  Max pain:  4/10 Least pain:  0/10 Nature of pain: dull  Patient Goals: Would like to stop having to wear depends.   OBJECTIVE  Changes in:  Range of Motion/Flexibilty:  Decreased mobility of spine through the lumbo-thoracic junction region. Improved  mobility following treatment.  Palpation: TTP through B adductors and Iliacus. Decreased TTP through Adductors following TP release.   INTERVENTIONS THIS SESSION: Self-care: educated on importance of taking the time and effort to practice the breathing with her pelvic tilts in order to improve coordination. Educated on the potential use of dry needling to improve rate of spasm reduction for faster return of function. Educated on potential applications of e-stim with dry needles in treating parkinson's symptoms. Manual: TP release to B adductors for decreased spasm and referred pain to the pelvic floor. PA mobs to T10-L3 to improve mobility and decrease pressure on nerve roots as they course toward the pelvis. Therex: reviewed pelvic tilts with breathing to improve performance of HEP and improve coordination.  Total time: 65 min.                         PT Education - 11/10/17 1627    Education provided  Yes    Education Details  See Interventions this session    Person(s) Educated  Patient;Caregiver(s)    Methods  Explanation;Demonstration;Tactile cues;Verbal cues    Comprehension  Verbalized understanding;Returned demonstration;Verbal cues required;Tactile cues required;Need further instruction       PT Short Term Goals - 10/06/17 2050      PT SHORT TERM GOAL #1   Title  Patient will demonstrate a coordinated contraction, relaxation, and bulge of the pelvic floor muscles to demonstrate functional recruitment and motion and allow for further strengthening.    Time  5    Period  Weeks    Status  New    Target Date  11/10/17      PT SHORT TERM GOAL #2   Title  Patient will demonstrate HEP x1 in the clinic to demonstrate understanding and proper form to allow for further improvement.    Time  5    Period  Weeks    Status  New    Target Date  11/10/17      PT SHORT TERM GOAL #3   Title  Patient will demonstrate improved pelvic alignment and balance of  musculature surrounding the pelvis to facilitate decreased PFM spasms and decrease Urinary Incontinence    Time  5    Period  Weeks    Status  New    Target Date  11/10/17        PT Long Term Goals - 10/06/17 2054      PT LONG TERM GOAL #1   Title  Patient will report no episodes of SUI over the course of the prior two weeks to demonstrate improved functional ability.    Baseline  Having to wear depends all the time due to leaking when reaching up or at light    Time  10    Period  Weeks    Status  New    Target Date  12/15/17      PT LONG TERM GOAL #2   Title  Patient will score at or below 10/300 on the PFDI and 102/300 on the PFIQ to demonstrate a clinically meaningful decrease in disability and distress due to pelvic floor dysfunction.    Baseline  PFDI:55/300, PFIQ:147/300    Time  10    Period  Weeks    Status  New    Target Date  12/15/17      PT LONG TERM GOAL #3   Title  Patient will demonstrate ability to balance for 15 seconds on each leg to demonstrate improved core stability and balance     Baseline  3 second SLS B.    Time  10    Period  Weeks    Status  New    Target Date  12/15/17            Plan - 11/10/17 1628    Clinical Impression Statement  Pt. demonstrated slow release of TPs but responded appropriately to all interventions with decreased spasms and improved mobility as well as improved performance of her HEP following treatment. She will think about dry needling and decide if she is interested at next visit. Continue per POC.    Clinical Presentation  Evolving    Clinical Decision Making  High    Rehab Potential  Fair    Clinical Impairments Affecting Rehab Potential  Parkinson's, failed PTNS, diabetes    PT Frequency  1x / week    PT Duration  Other (comment) 10 weeks    PT Treatment/Interventions  ADLs/Self Care Home Management;Aquatic Therapy;Biofeedback;Electrical Stimulation;Gait training;Stair training;Functional mobility  training;Therapeutic activities;Therapeutic exercise;Balance training;Patient/family education;Neuromuscular re-education;Manual techniques;Scar mobilization;Taping;Dry needling;Passive range of motion    PT Next Visit Plan  re-check PFM, review pelvic tilts with breathing and "lengthening kegels" TP release and/or DN and mobilization of iliacus and training on sit-to-stand with kegel and urge-suppression technique.    PT [place] (5 second holds), hamstring stretch, supine pelvic tilts with brething and kegel.    Consulted and Agree with Plan of Care  Patient;Family member/caregiver  Family Member Consulted  Konrad Dolores        Patient will benefit from skilled therapeutic intervention in order to improve the following deficits and impairments:  Abnormal gait, Increased fascial restricitons, Improper body mechanics, Decreased coordination, Decreased mobility, Increased muscle spasms, Impaired tone, Postural dysfunction, Decreased activity tolerance, Decreased endurance, Decreased range of motion, Decreased strength, Hypomobility, Decreased balance, Difficulty walking, Impaired flexibility  Visit Diagnosis: Other lack of coordination  Muscle weakness (generalized)  Other muscle spasm  Balance disorder  Urinary incontinence without sensory awareness     Problem List Patient Active Problem List   Diagnosis Date Noted  . Asthma without status asthmaticus 07/08/2016  . Hyperlipidemia, unspecified 07/08/2016  . Dysphagia 01/07/2016  . Unsteadiness 10/01/2015  . Type 2 diabetes mellitus without complication, without long-term current use of insulin ([place]) 08/07/2015  . Mixed Alzheimer's and vascular dementia 08/06/2015  . Controlled type 2 diabetes mellitus without complication, without long-term current use of insulin ([place]) 02/12/2015  . Diabetes type 2, controlled ([place]) 06/19/2014  . Essential hypertension 03/08/2014  . Parkinson's disease ([place]) 01/16/2014  .  Fatigue 11/09/2013  . Diabetes mellitus, type II ([place]) 09/15/2013  . Difficulty walking 09/15/2013  . Dizziness 09/15/2013   Willa Rough DPT, ATC Willa Rough 11/10/2017, 4:33 PM  [place] MAIN [facility] SERVICES [address] [place], [place], [address] Phone: [phone]   Fax:  [phone]  Name: MAEOLA MCHANEY MRN: [id number] Date of Birth: 1935-10-11

## 2017-11-17 ENCOUNTER — Ambulatory Visit: Payer: Medicare HMO

## 2017-11-17 DIAGNOSIS — R278 Other lack of coordination: Secondary | ICD-10-CM

## 2017-11-17 DIAGNOSIS — M6281 Muscle weakness (generalized): Secondary | ICD-10-CM | POA: Diagnosis not present

## 2017-11-17 DIAGNOSIS — M62838 Other muscle spasm: Secondary | ICD-10-CM | POA: Diagnosis not present

## 2017-11-17 DIAGNOSIS — R2689 Other abnormalities of gait and mobility: Secondary | ICD-10-CM

## 2017-11-17 DIAGNOSIS — N3942 Incontinence without sensory awareness: Secondary | ICD-10-CM

## 2017-11-17 NOTE — Patient Instructions (Addendum)
1) I want you to play music and either sing or dance to it or both every day for at least 30 minutes every day!   2)  Sit, feet flat, scoot forward to the edge of the chair. Inhale as you bend forward at hips, begin to exhale just before and while you stand, contracting the glutes, lower tummy muscles and pelvic floor as if stopping urination as you stand up.   * Do this every time you sit or stand! If you catch yourself doing it "wrong, re-set and do it again so it can become habit!      Urge supression technique:  1) Take a deep breath to convince yourself that you are in control and calm the nervous system.  2) Do 5 "quick-flick" kegels (pelvic floor muscle contractions) and re-assess the urge. Repeat another set if urge is still present. 3) Once the urge has decreased, start walking calmly to the the bathroom. Stop and repeat steps 1 and 2 as many times as needed until you can successfully get to the toilet. 4) Only once seated, take a deep breath and allow the pelvic floor muscles to relax and allow for the urine to flow.    Do not be discouraged if you are not successful the first couple times, this is normal and it will take practice but remember that YOU are in control. Start by practicing this at home where you do not have to worry as much if there were to be an accident. Allowing yourself to get rushed or nervous puts the bladder back in control and will not allow the technique to work.

## 2017-11-17 NOTE — Therapy (Signed)
[place] [facility] [address] [place], [place], [address] Phone: [phone]   Fax:  [phone]  Physical Therapy Treatment  Patient Details  Name: Jacqueline [name] MRN: [id number] Date of Birth: [birth year] Referring Provider: Zara Council   Encounter Date: 11/17/2017  PT End of Session - 11/17/17 2217    Visit Number  5    Number of Visits  10    Date for PT Re-Evaluation  12/15/17    Authorization Type  Medicare    PT Start Time  1333    PT Stop Time  1433    PT Time Calculation (min)  60 min    Activity Tolerance  Patient tolerated treatment well    Behavior During Therapy  [facility] for tasks assessed/performed       Past Medical History:  Diagnosis Date  . Asthma   . Collapsed lung   . Diabetes mellitus without complication ([place])    Patient takes Insulin twice a day.   Marland Kitchen GERD (gastroesophageal reflux disease)   . Hyperlipidemia   . Hypertension   . Hypothyroidism   . Parkinson's disease ([place])   . Reactive airway disease     Past Surgical History:  Procedure Laterality Date  . ABDOMINAL HYSTERECTOMY    . CHOLECYSTECTOMY    . COLONOSCOPY WITH PROPOFOL N/A 03/29/2015   Procedure: COLONOSCOPY WITH PROPOFOL;  Surgeon: Hulen Luster, MD;  Location: [facility] ENDOSCOPY;  Service: Gastroenterology;  Laterality: N/A;  . ESOPHAGOGASTRODUODENOSCOPY (EGD) WITH PROPOFOL N/A 03/29/2015   Procedure: ESOPHAGOGASTRODUODENOSCOPY (EGD) WITH PROPOFOL;  Surgeon: Hulen Luster, MD;  Location: [facility] ENDOSCOPY;  Service: Gastroenterology;  Laterality: N/A;  . lung collapse     broken rib from fall punctured lung    There were no vitals filed for this visit.    Pelvic Floor Physical Therapy Treatment Note  SCREENING  Changes in medications, allergies, or medical history?: no   SUBJECTIVE  Patient reports: She has decided that she is not interested [facility]. Is not feeling great because she has not been able to walk on the track  lately and this is affecting her mood. She has noticed that she get out of bed less often about every 3-5 instead of 5-6 times a night.   Precautions:  Patrkinson's  Pain update: No pain.  Patient Goals: TTP through B adductors and Iliacus. Decreased TTP through Adductors following TP release.    OBJECTIVE  Changes in: Posture/Observations:  Patient was able to stand up and sit up with little effort when appropriately incorporating the exhale timing. Pt. Demonstrates improved coordination of pelvic tilt with breathing.  INTERVENTIONS THIS SESSION: NM re-ed: educated pt. On PFM contraction timing with exhale for sit-to-stand technique to decrease leakage with this motion and reviewed the concept with supine-to sit as well and educated on and practiced urge suppression technique with the scenarios that cause increased leakage to allow for improved continence. Self-care: Educated pt. On the importance of movement in releasing the chemicals her brain needs for her to function optimally and instructed patient to dance and sing for at least 30 min. Per day to also improve her TA recruitment and increase her volume of speech as well.  Total time: 67 min.                           PT Short Term Goals - 11/17/17 2217      PT SHORT TERM  GOAL #1   Title  Patient will demonstrate a coordinated contraction, relaxation, and bulge of the pelvic floor muscles to demonstrate functional recruitment and motion and allow for further strengthening.    Time  5    Period  Weeks    Status  On-going    Target Date  11/10/17      PT SHORT TERM GOAL #2   Title  Patient will demonstrate HEP x1 in the clinic to demonstrate understanding and proper form to allow for further improvement.    Time  5    Period  Weeks    Status  Achieved    Target Date  11/10/17      PT SHORT TERM GOAL #3   Title  Patient will demonstrate improved pelvic alignment and balance of musculature  surrounding the pelvis to facilitate decreased PFM spasms and decrease Urinary Incontinence    Time  5    Period  Weeks    Status  On-going    Target Date  11/10/17        PT Long Term Goals - 10/06/17 2054      PT LONG TERM GOAL #1   Title  Patient will report no episodes of SUI over the course of the prior two weeks to demonstrate improved functional ability.    Baseline  Having to wear depends all the time due to leaking when reaching up or at light    Time  10    Period  Weeks    Status  New    Target Date  12/15/17      PT LONG TERM GOAL #2   Title  Patient will score at or below 10/300 on the PFDI and 102/300 on the PFIQ to demonstrate a clinically meaningful decrease in disability and distress due to pelvic floor dysfunction.    Baseline  PFDI:55/300, PFIQ:147/300    Time  10    Period  Weeks    Status  New    Target Date  12/15/17      PT LONG TERM GOAL #3   Title  Patient will demonstrate ability to balance for 15 seconds on each leg to demonstrate improved core stability and balance     Baseline  3 second SLS B.    Time  10    Period  Weeks    Status  New    Target Date  12/15/17            Plan - 11/17/17 2213    Clinical Impression Statement  Pt. responded well to all interventions today and demonstrated understanding of all education though she may need review of urge suppression and sit-to-stand. Continue per POC.    Clinical Presentation  Evolving    Clinical Decision Making  High    Rehab Potential  Fair    Clinical Impairments Affecting Rehab Potential  Parkinson's, failed PTNS, diabetes    PT Frequency  1x / week    PT Duration  Other (comment)    PT Treatment/Interventions  ADLs/Self Care Home Management;Aquatic Therapy;Biofeedback;Electrical Stimulation;Gait training;Stair training;Functional mobility training;Therapeutic activities;Therapeutic exercise;Balance training;Patient/family education;Neuromuscular re-education;Manual techniques;Scar  mobilization;Taping;Dry needling;Passive range of motion    PT Next Visit Plan  re-check PFM, review pelvic tilts with breathing and "lengthening kegels" TP release and/or further discuss DN? and mobilization of iliacus and review sit-to-stand with kegel and urge-suppression technique.    PT [place] (5 second holds), hamstring stretch, supine pelvic tilts with brething and kegel, urge  suppression, sit-to-stand    Consulted and Agree with Plan of Care  Patient;Family member/caregiver    Family Member Consulted  Konrad Dolores        Patient will benefit from skilled therapeutic intervention in order to improve the following deficits and impairments:  Abnormal gait, Increased fascial restricitons, Improper body mechanics, Decreased coordination, Decreased mobility, Increased muscle spasms, Impaired tone, Postural dysfunction, Decreased activity tolerance, Decreased endurance, Decreased range of motion, Decreased strength, Hypomobility, Decreased balance, Difficulty walking, Impaired flexibility  Visit Diagnosis: Other lack of coordination  Muscle weakness (generalized)  Other muscle spasm  Balance disorder  Urinary incontinence without sensory awareness     Problem List Patient Active Problem List   Diagnosis Date Noted  . Asthma without status asthmaticus 07/08/2016  . Hyperlipidemia, unspecified 07/08/2016  . Dysphagia 01/07/2016  . Unsteadiness 10/01/2015  . Type 2 diabetes mellitus without complication, without long-term current use of insulin ([place]) 08/07/2015  . Mixed Alzheimer's and vascular dementia 08/06/2015  . Controlled type 2 diabetes mellitus without complication, without long-term current use of insulin ([place]) 02/12/2015  . Diabetes type 2, controlled ([place]) 06/19/2014  . Essential hypertension 03/08/2014  . Parkinson's disease ([place]) 01/16/2014  . Fatigue 11/09/2013  . Diabetes mellitus, type II ([place]) 09/15/2013  . Difficulty walking 09/15/2013   . Dizziness 09/15/2013   Willa Rough DPT, ATC Willa Rough 11/17/2017, 10:21 PM  [place] [facility] [address] [place], [place], [address] Phone: [phone]   Fax:  [phone]  Name: AMINAT SHELBURNE MRN: [id number] Date of Birth: 13-Jun-1935

## 2017-11-22 DIAGNOSIS — M316 Other giant cell arteritis: Secondary | ICD-10-CM | POA: Diagnosis not present

## 2017-11-22 DIAGNOSIS — H9209 Otalgia, unspecified ear: Secondary | ICD-10-CM | POA: Diagnosis not present

## 2017-11-22 DIAGNOSIS — J300 Vasomotor rhinitis: Secondary | ICD-10-CM | POA: Diagnosis not present

## 2017-11-22 DIAGNOSIS — R51 Headache: Secondary | ICD-10-CM | POA: Diagnosis not present

## 2017-12-01 ENCOUNTER — Ambulatory Visit: Payer: Medicare HMO

## 2017-12-28 DIAGNOSIS — M216X2 Other acquired deformities of left foot: Secondary | ICD-10-CM | POA: Diagnosis not present

## 2017-12-28 DIAGNOSIS — M216X1 Other acquired deformities of right foot: Secondary | ICD-10-CM | POA: Diagnosis not present

## 2017-12-28 DIAGNOSIS — L851 Acquired keratosis [keratoderma] palmaris et plantaris: Secondary | ICD-10-CM | POA: Diagnosis not present

## 2017-12-28 DIAGNOSIS — E1142 Type 2 diabetes mellitus with diabetic polyneuropathy: Secondary | ICD-10-CM | POA: Diagnosis not present

## 2017-12-28 DIAGNOSIS — L97311 Non-pressure chronic ulcer of right ankle limited to breakdown of skin: Secondary | ICD-10-CM | POA: Diagnosis not present

## 2018-03-01 DIAGNOSIS — G20 Parkinson's disease: Secondary | ICD-10-CM | POA: Diagnosis not present

## 2018-03-01 DIAGNOSIS — R42 Dizziness and giddiness: Secondary | ICD-10-CM | POA: Diagnosis not present

## 2018-03-01 DIAGNOSIS — R251 Tremor, unspecified: Secondary | ICD-10-CM | POA: Diagnosis not present

## 2018-03-01 DIAGNOSIS — G478 Other sleep disorders: Secondary | ICD-10-CM | POA: Diagnosis not present

## 2018-03-01 DIAGNOSIS — R413 Other amnesia: Secondary | ICD-10-CM | POA: Diagnosis not present

## 2018-03-08 DIAGNOSIS — M216X2 Other acquired deformities of left foot: Secondary | ICD-10-CM | POA: Diagnosis not present

## 2018-03-08 DIAGNOSIS — E1142 Type 2 diabetes mellitus with diabetic polyneuropathy: Secondary | ICD-10-CM | POA: Diagnosis not present

## 2018-03-08 DIAGNOSIS — L851 Acquired keratosis [keratoderma] palmaris et plantaris: Secondary | ICD-10-CM | POA: Diagnosis not present

## 2018-03-29 ENCOUNTER — Ambulatory Visit (INDEPENDENT_AMBULATORY_CARE_PROVIDER_SITE_OTHER): Payer: Medicare HMO | Admitting: Internal Medicine

## 2018-03-29 ENCOUNTER — Encounter: Payer: Self-pay | Admitting: Internal Medicine

## 2018-03-29 DIAGNOSIS — G309 Alzheimer's disease, unspecified: Secondary | ICD-10-CM | POA: Diagnosis not present

## 2018-03-29 DIAGNOSIS — E1165 Type 2 diabetes mellitus with hyperglycemia: Secondary | ICD-10-CM | POA: Diagnosis not present

## 2018-03-29 DIAGNOSIS — E038 Other specified hypothyroidism: Secondary | ICD-10-CM | POA: Diagnosis not present

## 2018-03-29 DIAGNOSIS — I10 Essential (primary) hypertension: Secondary | ICD-10-CM

## 2018-03-29 DIAGNOSIS — F0150 Vascular dementia without behavioral disturbance: Secondary | ICD-10-CM

## 2018-03-29 DIAGNOSIS — G20A1 Parkinson's disease without dyskinesia, without mention of fluctuations: Secondary | ICD-10-CM

## 2018-03-29 DIAGNOSIS — F0280 Dementia in other diseases classified elsewhere without behavioral disturbance: Secondary | ICD-10-CM

## 2018-03-29 DIAGNOSIS — R69 Illness, unspecified: Secondary | ICD-10-CM | POA: Diagnosis not present

## 2018-03-29 DIAGNOSIS — G20 Parkinson's disease: Secondary | ICD-10-CM

## 2018-03-29 LAB — POCT GLYCOSYLATED HEMOGLOBIN (HGB A1C): Hemoglobin A1C: 7.5 % — AB (ref 4.0–5.6)

## 2018-03-29 NOTE — Progress Notes (Signed)
[facility] [place], [place] [address]  Internal MEDICINE  Office Visit Note  Patient Name: Jacqueline [name]  [id number]  [id number]  Date of Service: 03/30/2018  Chief Complaint  Patient presents with  . Medical Management of Chronic Issues    6 month follow up  . Hypertension  . Hyperlipidemia  . Quality Metric Gaps    flu vaccine, pneumovax, eye exam scheduled 2wks, foot exam    HPI  Pt is here with her daughter. No complaints, parkinson's is followed by neurology and she is declining. Dm is followed by endocrinology, she does not want to get mammograms any more. memory is stable   Current Medication: Outpatient Encounter Medications as of 03/29/2018  Medication Sig  . BD PEN NEEDLE NANO U/F 32G X 4 MM MISC FOR TWICE DAILY BLOOD SUGAR TESTING E11.65  . budesonide-formoterol (SYMBICORT) 80-4.5 MCG/ACT inhaler Inhale 2 puffs into the lungs 2 (two) times daily.  . calcium-vitamin D (OSCAL-500) 500-400 MG-UNIT tablet Take 1 tablet by mouth 2 (two) times daily.  . carbidopa-levodopa (SINEMET IR) 25-100 MG tablet Take 0.5 tablets 5 (five) times daily by mouth.   . Cholecalciferol (VITAMIN D3) 2000 units capsule Take by mouth.  . donepezil (ARICEPT) 10 MG tablet Take 10 mg by mouth at bedtime.  . fluticasone (FLONASE) 50 MCG/ACT nasal spray Place into the nose.  Marland Kitchen glucose blood (FREESTYLE LITE) test strip Use as instructed  . ibuprofen (ADVIL,MOTRIN) 400 MG tablet Take 400 mg by mouth every 6 (six) hours as needed.  . insulin aspart (NOVOLOG) 100 UNIT/ML FlexPen Sliding Scale TID with meals up to 30 units  . Insulin Glargine (LANTUS SOLOSTAR) 100 UNIT/ML Solostar Pen Inject 10 Units daily at 10 pm into the skin.   Marland Kitchen levothyroxine (SYNTHROID, LEVOTHROID) 50 MCG tablet Take 50 mcg by mouth daily before breakfast.  . memantine (NAMENDA) 5 MG tablet Take 1 tablet daily by mouth.  Marland Kitchen omeprazole (PRILOSEC) 40 MG capsule Take 40 mg by mouth daily.  . rasagiline  (AZILECT) 1 MG TABS tablet Take 1 mg by mouth daily.  . vitamin E 400 UNIT capsule Take 400 Units by mouth daily.  Marland Kitchen azelastine (OPTIVAR) 0.05 % ophthalmic solution   . ketorolac (ACULAR) 0.5 % ophthalmic solution INSTILL 1 DROP INTO RIGHT EYE Daily  . mirabegron ER (MYRBETRIQ) 50 MG TB24 tablet Take 1 tablet (50 mg total) by mouth daily. (Patient not taking: Reported on 03/29/2018)   No facility-administered encounter medications on file as of 03/29/2018.     Surgical History: Past Surgical History:  Procedure Laterality Date  . ABDOMINAL HYSTERECTOMY    . CHOLECYSTECTOMY    . COLONOSCOPY WITH PROPOFOL N/A 03/29/2015   Procedure: COLONOSCOPY WITH PROPOFOL;  Surgeon: Hulen Luster, MD;  Location: [facility] ENDOSCOPY;  Service: Gastroenterology;  Laterality: N/A;  . ESOPHAGOGASTRODUODENOSCOPY (EGD) WITH PROPOFOL N/A 03/29/2015   Procedure: ESOPHAGOGASTRODUODENOSCOPY (EGD) WITH PROPOFOL;  Surgeon: Hulen Luster, MD;  Location: [facility] ENDOSCOPY;  Service: Gastroenterology;  Laterality: N/A;  . lung collapse     broken rib from fall punctured lung    Medical History: Past Medical History:  Diagnosis Date  . Asthma   . Collapsed lung   . Diabetes mellitus without complication ([place])    Patient takes Insulin twice a day.   Marland Kitchen GERD (gastroesophageal reflux disease)   . Hyperlipidemia   . Hypertension   . Hypothyroidism   . Parkinson's disease ([place])   . Reactive airway disease  Family History: Family History  Problem Relation Age of Onset  . Bladder Cancer Neg Hx   . Kidney cancer Neg Hx     Social History   Socioeconomic History  . Marital status: Widowed    Spouse name: Not on file  . Number of children: Not on file  . Years of education: Not on file  . Highest education level: Not on file  Occupational History  . Not on file  Social Needs  . Financial resource strain: Not on file  . Food insecurity:    Worry: Not on file    Inability: Not on file  . Transportation needs:     Medical: Not on file    Non-medical: Not on file  Tobacco Use  . Smoking status: Never Smoker  . Smokeless tobacco: Never Used  Substance and Sexual Activity  . Alcohol use: Not Currently  . Drug use: No  . Sexual activity: Not on file  Lifestyle  . Physical activity:    Days per week: Not on file    Minutes per session: Not on file  . Stress: Not on file  Relationships  . Social connections:    Talks on phone: Not on file    Gets together: Not on file    Attends religious service: Not on file    Active member of club or organization: Not on file    Attends meetings of clubs or organizations: Not on file    Relationship status: Not on file  . Intimate partner violence:    Fear of current or ex partner: Not on file    Emotionally abused: Not on file    Physically abused: Not on file    Forced sexual activity: Not on file  Other Topics Concern  . Not on file  Social History Narrative  . Not on file   Review of Systems  Constitutional: Negative for chills, diaphoresis and fatigue.  HENT: Negative for ear pain, postnasal drip and sinus pressure.   Eyes: Negative for photophobia, discharge, redness, itching and visual disturbance.  Respiratory: Negative for cough, shortness of breath and wheezing.   Cardiovascular: Negative for chest pain, palpitations and leg swelling.  Gastrointestinal: Negative for abdominal pain, constipation, diarrhea, nausea and vomiting.  Genitourinary: Negative for dysuria and flank pain.  Musculoskeletal: Negative for arthralgias, back pain, gait problem and neck pain.  Skin: Negative for color change.  Allergic/Immunologic: Negative for environmental allergies and food allergies.  Neurological: Negative for dizziness and headaches.  Hematological: Does not bruise/bleed easily.  Psychiatric/Behavioral: Negative for agitation, behavioral problems (depression) and hallucinations.    Vital Signs: BP 110/60 (BP Location: Right Arm, Patient Position:  Sitting, Cuff Size: Normal)   Pulse 91   Resp 16   Ht 5\' 2"  (1.575 m)   Wt 115 lb 3.2 oz (52.3 kg)   SpO2 95%   BMI 21.07 kg/m    Physical Exam  Constitutional: She is oriented to person, place, and time. She appears well-developed and well-nourished. No distress.  HENT:  Head: Normocephalic and atraumatic.  Mouth/Throat: Oropharynx is clear and moist. No oropharyngeal exudate.  Eyes: Pupils are equal, round, and reactive to light. EOM are normal.  Neck: Normal range of motion. Neck supple. No JVD present. No tracheal deviation present. No thyromegaly present.  Cardiovascular: Normal rate, regular rhythm and normal heart sounds. Exam reveals no gallop and no friction rub.  No murmur heard. Pulmonary/Chest: Effort normal. No respiratory distress. She has no wheezes. She has no  rales. She exhibits no tenderness.  Abdominal: Soft. Bowel sounds are normal.  Musculoskeletal: Normal range of motion.  Lymphadenopathy:    She has no cervical adenopathy.  Neurological: She is alert and oriented to person, place, and time. No cranial nerve deficit.  Skin: Skin is warm and dry. She is not diaphoretic.  Psychiatric: She has a normal mood and affect. Her behavior is normal. Judgment and thought content normal.   Assessment/Plan: 1. Uncontrolled type 2 diabetes mellitus with hyperglycemia ([place]) All labs are updated - POCT HgB A1C - CBC with Differential/Platelet - Lipid Panel With LDL/HDL Ratio - TSH - T4, free - Comprehensive metabolic panel - Urinalysis - Microalbumin, urine  2. Essential hypertension Controlled  3. Parkinson's disease ([place]) Followed up by Neurology   4. Mixed Alzheimer's and vascular dementia ([place]) Stable   5. Other specified hypothyroidism Controlled   General Counseling: Franceska verbalizes understanding of the findings of todays visit and agrees with plan of treatment. I have discussed any further diagnostic evaluation that may be needed or ordered today. We  also reviewed her medications today. she has been encouraged to call the office with any questions or concerns that should arise related to todays visit.    Orders Placed This Encounter  Procedures  . CBC with Differential/Platelet  . Lipid Panel With LDL/HDL Ratio  . TSH  . T4, free  . Comprehensive metabolic panel  . Urinalysis  . Microalbumin, urine  . POCT HgB A1C    No orders of the defined types were placed in this encounter.   Time spent: [place] [facility]

## 2018-04-06 DIAGNOSIS — E039 Hypothyroidism, unspecified: Secondary | ICD-10-CM | POA: Diagnosis not present

## 2018-04-06 DIAGNOSIS — E042 Nontoxic multinodular goiter: Secondary | ICD-10-CM | POA: Diagnosis not present

## 2018-04-06 DIAGNOSIS — Z794 Long term (current) use of insulin: Secondary | ICD-10-CM | POA: Diagnosis not present

## 2018-04-06 DIAGNOSIS — E1129 Type 2 diabetes mellitus with other diabetic kidney complication: Secondary | ICD-10-CM | POA: Diagnosis not present

## 2018-04-06 DIAGNOSIS — R809 Proteinuria, unspecified: Secondary | ICD-10-CM | POA: Diagnosis not present

## 2018-04-09 ENCOUNTER — Other Ambulatory Visit: Payer: Self-pay | Admitting: Internal Medicine

## 2018-04-14 DIAGNOSIS — H26493 Other secondary cataract, bilateral: Secondary | ICD-10-CM | POA: Diagnosis not present

## 2018-04-14 DIAGNOSIS — H1045 Other chronic allergic conjunctivitis: Secondary | ICD-10-CM | POA: Diagnosis not present

## 2018-04-14 DIAGNOSIS — E119 Type 2 diabetes mellitus without complications: Secondary | ICD-10-CM | POA: Diagnosis not present

## 2018-05-17 ENCOUNTER — Ambulatory Visit: Payer: Self-pay | Admitting: Nurse Practitioner

## 2018-05-17 DIAGNOSIS — B351 Tinea unguium: Secondary | ICD-10-CM | POA: Diagnosis not present

## 2018-05-17 DIAGNOSIS — E1142 Type 2 diabetes mellitus with diabetic polyneuropathy: Secondary | ICD-10-CM | POA: Diagnosis not present

## 2018-05-17 DIAGNOSIS — L851 Acquired keratosis [keratoderma] palmaris et plantaris: Secondary | ICD-10-CM | POA: Diagnosis not present

## 2018-05-18 ENCOUNTER — Other Ambulatory Visit: Payer: Self-pay | Admitting: Internal Medicine

## 2018-06-16 ENCOUNTER — Ambulatory Visit (INDEPENDENT_AMBULATORY_CARE_PROVIDER_SITE_OTHER): Payer: Medicare HMO | Admitting: Nurse Practitioner

## 2018-06-16 ENCOUNTER — Encounter: Payer: Self-pay | Admitting: Nurse Practitioner

## 2018-06-16 VITALS — BP 122/62 | HR 69 | Resp 16 | Ht 65.0 in | Wt 115.0 lb

## 2018-06-16 DIAGNOSIS — J4520 Mild intermittent asthma, uncomplicated: Secondary | ICD-10-CM

## 2018-06-16 DIAGNOSIS — E1165 Type 2 diabetes mellitus with hyperglycemia: Secondary | ICD-10-CM | POA: Diagnosis not present

## 2018-06-16 DIAGNOSIS — G20A1 Parkinson's disease without dyskinesia, without mention of fluctuations: Secondary | ICD-10-CM

## 2018-06-16 DIAGNOSIS — Z0001 Encounter for general adult medical examination with abnormal findings: Secondary | ICD-10-CM | POA: Diagnosis not present

## 2018-06-16 DIAGNOSIS — I10 Essential (primary) hypertension: Secondary | ICD-10-CM

## 2018-06-16 DIAGNOSIS — G20 Parkinson's disease: Secondary | ICD-10-CM | POA: Diagnosis not present

## 2018-06-16 LAB — POCT GLYCOSYLATED HEMOGLOBIN (HGB A1C): Hemoglobin A1C: 7.5 % — AB (ref 4.0–5.6)

## 2018-06-16 NOTE — Progress Notes (Signed)
[facility] [place], [place] [address]  Internal MEDICINE  Office Visit Note  Patient Name: Jacqueline [name]  [id number]  [id number]  Date of Service: 06/21/2018   Pt is here for routine health maintenance examination  Chief Complaint  Patient presents with  . Medicare Wellness    6wk well visit, pt is sleeping a lot more.     The patient is here for routine health maintenance exam. She is feeling more fatigued more recently. She states that every time she eats, she feels tired and has to take a short nap. Also having some increased movement associated with parkinson's disease. She will seen her neurologist for follow up in February.     Current Medication: Outpatient Encounter Medications as of 06/16/2018  Medication Sig  . azelastine (OPTIVAR) 0.05 % ophthalmic solution   . BD PEN NEEDLE NANO U/F 32G X 4 MM MISC FOR TWICE DAILY BLOOD SUGAR TESTING E11.65  . calcium-vitamin D (OSCAL-500) 500-400 MG-UNIT tablet Take 1 tablet by mouth 2 (two) times daily.  . carbidopa-levodopa (SINEMET IR) 25-100 MG tablet Take 0.5 tablets 5 (five) times daily by mouth.   . Cholecalciferol (VITAMIN D3) 2000 units capsule Take by mouth.  . donepezil (ARICEPT) 10 MG tablet Take 10 mg by mouth at bedtime.  . fluticasone (FLONASE) 50 MCG/ACT nasal spray Place into the nose.  Marland Kitchen glucose blood (FREESTYLE LITE) test strip Use as instructed  . ibuprofen (ADVIL,MOTRIN) 400 MG tablet Take 400 mg by mouth every 6 (six) hours as needed.  . insulin aspart (NOVOLOG) 100 UNIT/ML FlexPen Sliding Scale TID with meals up to 30 units  . Insulin Glargine (LANTUS SOLOSTAR) 100 UNIT/ML Solostar Pen Inject 10 Units daily at 10 pm into the skin.   Marland Kitchen ketorolac (ACULAR) 0.5 % ophthalmic solution INSTILL 1 DROP INTO RIGHT EYE Daily  . levothyroxine (SYNTHROID, LEVOTHROID) 50 MCG tablet Take 50 mcg by mouth daily before breakfast.  . omeprazole (PRILOSEC) 40 MG capsule TAKE 1 CAPSULE BY MOUTH EVERY  DAY  . rasagiline (AZILECT) 1 MG TABS tablet Take 1 mg by mouth daily.  . SYMBICORT 80-4.5 MCG/ACT inhaler TAKE 2 PUFFS BY MOUTH TWICE A DAY  . vitamin E 400 UNIT capsule Take 400 Units by mouth daily.  . memantine (NAMENDA) 5 MG tablet Take 1 tablet daily by mouth.  . mirabegron ER (MYRBETRIQ) 50 MG TB24 tablet Take 1 tablet (50 mg total) by mouth daily. (Patient not taking: Reported on 03/29/2018)   No facility-administered encounter medications on file as of 06/16/2018.     Surgical History: Past Surgical History:  Procedure Laterality Date  . ABDOMINAL HYSTERECTOMY    . CHOLECYSTECTOMY    . COLONOSCOPY WITH PROPOFOL N/A 03/29/2015   Procedure: COLONOSCOPY WITH PROPOFOL;  Surgeon: Hulen Luster, MD;  Location: [facility] ENDOSCOPY;  Service: Gastroenterology;  Laterality: N/A;  . ESOPHAGOGASTRODUODENOSCOPY (EGD) WITH PROPOFOL N/A 03/29/2015   Procedure: ESOPHAGOGASTRODUODENOSCOPY (EGD) WITH PROPOFOL;  Surgeon: Hulen Luster, MD;  Location: [facility] ENDOSCOPY;  Service: Gastroenterology;  Laterality: N/A;  . lung collapse     broken rib from fall punctured lung    Medical History: Past Medical History:  Diagnosis Date  . Asthma   . Collapsed lung   . Diabetes mellitus without complication ([place])    Patient takes Insulin twice a day.   Marland Kitchen GERD (gastroesophageal reflux disease)   . Hyperlipidemia   . Hypertension   . Hypothyroidism   . Parkinson's disease ([place])   .  Reactive airway disease     Family History: Family History  Problem Relation Age of Onset  . Bladder Cancer Neg Hx   . Kidney cancer Neg Hx       Review of Systems  Constitutional: Positive for fatigue. Negative for chills and unexpected weight change.  HENT: Negative for congestion, postnasal drip, rhinorrhea, sneezing and sore throat.   Respiratory: Negative for cough, chest tightness, shortness of breath and wheezing.   Cardiovascular: Negative for chest pain and palpitations.  Gastrointestinal: Negative for abdominal  pain, constipation, diarrhea, nausea and vomiting.  Endocrine: Negative for cold intolerance, heat intolerance, polydipsia and polyuria.       Blood sugars are stable. She is due to have thyroid panel checked.   Genitourinary: Negative for dysuria and frequency.  Musculoskeletal: Positive for arthralgias and myalgias. Negative for back pain, joint swelling and neck pain.  Skin: Negative for rash.  Allergic/Immunologic: Negative for environmental allergies.  Neurological: Positive for tremors and weakness. Negative for dizziness, numbness and headaches.  Hematological: Negative for adenopathy. Does not bruise/bleed easily.  Psychiatric/Behavioral: Negative for behavioral problems (Depression), dysphoric mood, sleep disturbance and suicidal ideas. The patient is not nervous/anxious.      Today's Vitals   06/16/18 1449  BP: 122/62  Pulse: 69  Resp: 16  SpO2: 92%  Weight: 115 lb (52.2 kg)  Height: 5\' 5"  (1.651 m)    Physical Exam Vitals signs and nursing note reviewed.  Constitutional:      General: She is not in acute distress.    Appearance: Normal appearance. She is well-developed. She is not diaphoretic.  HENT:     Head: Normocephalic and atraumatic.     Nose: Nose normal.     Mouth/Throat:     Pharynx: No oropharyngeal exudate.  Eyes:     Extraocular Movements: Extraocular movements intact.     Conjunctiva/sclera: Conjunctivae normal.     Pupils: Pupils are equal, round, and reactive to light.  Neck:     Musculoskeletal: Normal range of motion and neck supple.     Thyroid: No thyromegaly.     Vascular: No carotid bruit or JVD.     Trachea: No tracheal deviation.  Cardiovascular:     Rate and Rhythm: Normal rate and regular rhythm.     Pulses: Normal pulses.     Heart sounds: Normal heart sounds. No murmur. No friction rub. No gallop.   Pulmonary:     Effort: Pulmonary effort is normal. No respiratory distress.     Breath sounds: Normal breath sounds. No wheezing or  rales.  Chest:     Chest wall: No tenderness.  Abdominal:     General: Bowel sounds are normal.     Palpations: Abdomen is soft.     Tenderness: There is no abdominal tenderness.  Musculoskeletal: Normal range of motion.  Lymphadenopathy:     Cervical: No cervical adenopathy.  Skin:    General: Skin is warm and dry.     Capillary Refill: Capillary refill takes 2 to 3 seconds.  Neurological:     Mental Status: She is alert and oriented to person, place, and time. Mental status is at baseline.     Cranial Nerves: No cranial nerve deficit.  Psychiatric:        Behavior: Behavior normal.        Thought Content: Thought content normal.        Judgment: Judgment normal.    Depression screen [facility] 2/9 06/16/2018 03/29/2018 09/22/2017  Decreased Interest  0 0 0  Down, Depressed, Hopeless 0 0 0  PHQ - 2 Score 0 0 0    Functional Status Survey: Is the patient deaf or have difficulty hearing?: No Does the patient have difficulty seeing, even when wearing glasses/contacts?: No Does the patient have difficulty concentrating, remembering, or making decisions?: No Does the patient have difficulty walking or climbing stairs?: Yes Does the patient have difficulty dressing or bathing?: Yes Does the patient have difficulty doing errands alone such as visiting a doctor's office or shopping?: Yes  MMSE - [place] Exam 06/21/2018  Orientation to time 5  Orientation to Place 5  Registration 3  Attention/ Calculation 4  Recall 3  Language- name 2 objects 2  Language- repeat 1  Language- follow 3 step command 2  Language- read & follow direction 1  Write a sentence 1  Copy design 0  Total score 27    Fall Risk  06/16/2018 03/29/2018 09/22/2017  Falls in the past year? 0 No Yes  Number falls in past yr: - - 2 or more  Injury with Fall? - - No      LABS: Recent Results (from the past 2160 hour(s))  POCT HgB A1C     Status: Abnormal   Collection Time: 03/29/18 11:39 AM  Result Value  Ref Range   Hemoglobin A1C 7.5 (A) 4.0 - 5.6 %   HbA1c POC (<> result, manual entry)     HbA1c, POC (prediabetic range)     HbA1c, POC (controlled diabetic range)    POCT HgB A1C     Status: Abnormal   Collection Time: 06/16/18  3:48 PM  Result Value Ref Range   Hemoglobin A1C 7.5 (A) 4.0 - 5.6 %   HbA1c POC (<> result, manual entry)     HbA1c, POC (prediabetic range)     HbA1c, POC (controlled diabetic range)     Assessment/Plan: 1. Encounter for general adult medical examination with abnormal findings Annual wellness visit today  2. Uncontrolled type 2 diabetes mellitus with hyperglycemia (HCC) - POCT HgB A1C stable at 7.5 today. Continue diabetic medication as prescribed   3. Essential hypertension Stable. Continue bp medication as prescribed   4. Parkinson's disease ([place]) Regular visits with neurology as scheduled   5. Mild intermittent asthma without status asthmaticus without complication Inhalers should be used as prescribed   General Counseling: Breean verbalizes understanding of the findings of todays visit and agrees with plan of treatment. I have discussed any further diagnostic evaluation that may be needed or ordered today. We also reviewed her medications today. she has been encouraged to call the office with any questions or concerns that should arise related to todays visit.    Counseling:  Diabetes Counseling:  1. Addition of ACE inh/ ARB'S for nephroprotection. Microalbumin is updated  2. Diabetic foot care, prevention of complications. Podiatry consult 3. Exercise and lose weight.  4. Diabetic eye examination, Diabetic eye exam is updated  5. Monitor blood sugar closlely. nutrition counseling.  6. Sign and symptoms of hypoglycemia including shaking sweating,confusion and headaches.  This patient was seen by Leretha Pol FNP Collaboration with Dr Lavera Guise as a part of collaborative care agreement  Orders Placed This Encounter  Procedures  . POCT  HgB A1C      Time spent: [place], MD  Internal Medicine

## 2018-06-21 DIAGNOSIS — Z0001 Encounter for general adult medical examination with abnormal findings: Secondary | ICD-10-CM | POA: Insufficient documentation

## 2018-06-21 DIAGNOSIS — E1165 Type 2 diabetes mellitus with hyperglycemia: Secondary | ICD-10-CM | POA: Insufficient documentation

## 2018-08-16 DIAGNOSIS — L851 Acquired keratosis [keratoderma] palmaris et plantaris: Secondary | ICD-10-CM | POA: Diagnosis not present

## 2018-08-16 DIAGNOSIS — B351 Tinea unguium: Secondary | ICD-10-CM | POA: Diagnosis not present

## 2018-08-16 DIAGNOSIS — E1142 Type 2 diabetes mellitus with diabetic polyneuropathy: Secondary | ICD-10-CM | POA: Diagnosis not present

## 2018-08-30 DIAGNOSIS — R69 Illness, unspecified: Secondary | ICD-10-CM | POA: Diagnosis not present

## 2018-09-22 ENCOUNTER — Ambulatory Visit: Payer: Self-pay | Admitting: Nurse Practitioner

## 2018-09-24 DIAGNOSIS — R69 Illness, unspecified: Secondary | ICD-10-CM | POA: Diagnosis not present

## 2018-10-03 DIAGNOSIS — M216X1 Other acquired deformities of right foot: Secondary | ICD-10-CM | POA: Diagnosis not present

## 2018-10-03 DIAGNOSIS — M79671 Pain in right foot: Secondary | ICD-10-CM | POA: Diagnosis not present

## 2018-10-03 DIAGNOSIS — L851 Acquired keratosis [keratoderma] palmaris et plantaris: Secondary | ICD-10-CM | POA: Diagnosis not present

## 2018-10-05 ENCOUNTER — Other Ambulatory Visit: Payer: Self-pay | Admitting: Internal Medicine

## 2018-10-07 DIAGNOSIS — R69 Illness, unspecified: Secondary | ICD-10-CM | POA: Diagnosis not present

## 2018-10-17 DIAGNOSIS — G20 Parkinson's disease: Secondary | ICD-10-CM | POA: Diagnosis not present

## 2018-10-25 DIAGNOSIS — Z794 Long term (current) use of insulin: Secondary | ICD-10-CM | POA: Diagnosis not present

## 2018-10-25 DIAGNOSIS — E039 Hypothyroidism, unspecified: Secondary | ICD-10-CM | POA: Diagnosis not present

## 2018-10-25 DIAGNOSIS — E782 Mixed hyperlipidemia: Secondary | ICD-10-CM | POA: Diagnosis not present

## 2018-10-25 DIAGNOSIS — E1159 Type 2 diabetes mellitus with other circulatory complications: Secondary | ICD-10-CM | POA: Diagnosis not present

## 2018-10-25 DIAGNOSIS — R809 Proteinuria, unspecified: Secondary | ICD-10-CM | POA: Diagnosis not present

## 2018-10-25 DIAGNOSIS — E1129 Type 2 diabetes mellitus with other diabetic kidney complication: Secondary | ICD-10-CM | POA: Diagnosis not present

## 2018-10-25 DIAGNOSIS — I10 Essential (primary) hypertension: Secondary | ICD-10-CM | POA: Diagnosis not present

## 2018-10-26 DIAGNOSIS — R69 Illness, unspecified: Secondary | ICD-10-CM | POA: Diagnosis not present

## 2018-10-30 ENCOUNTER — Other Ambulatory Visit: Payer: Self-pay | Admitting: Internal Medicine

## 2018-11-04 DIAGNOSIS — R69 Illness, unspecified: Secondary | ICD-10-CM | POA: Diagnosis not present

## 2018-11-13 ENCOUNTER — Other Ambulatory Visit: Payer: Self-pay | Admitting: Internal Medicine

## 2018-11-25 ENCOUNTER — Other Ambulatory Visit: Payer: Self-pay

## 2018-11-25 MED ORDER — OMEPRAZOLE 40 MG PO CPDR: DELAYED_RELEASE_CAPSULE | ORAL | 0 refills | Status: DC

## 2018-11-29 DIAGNOSIS — R69 Illness, unspecified: Secondary | ICD-10-CM | POA: Diagnosis not present

## 2018-12-19 DIAGNOSIS — L851 Acquired keratosis [keratoderma] palmaris et plantaris: Secondary | ICD-10-CM | POA: Diagnosis not present

## 2018-12-19 DIAGNOSIS — E1142 Type 2 diabetes mellitus with diabetic polyneuropathy: Secondary | ICD-10-CM | POA: Diagnosis not present

## 2019-01-02 DIAGNOSIS — R69 Illness, unspecified: Secondary | ICD-10-CM | POA: Diagnosis not present

## 2019-01-13 ENCOUNTER — Ambulatory Visit (INDEPENDENT_AMBULATORY_CARE_PROVIDER_SITE_OTHER): Payer: Medicare HMO | Admitting: Adult Health

## 2019-01-13 ENCOUNTER — Encounter: Payer: Self-pay | Admitting: Adult Health

## 2019-01-13 ENCOUNTER — Other Ambulatory Visit: Payer: Self-pay

## 2019-01-13 VITALS — BP 115/81 | HR 38 | Resp 16 | Ht 61.5 in | Wt 109.0 lb

## 2019-01-13 DIAGNOSIS — G20 Parkinson's disease: Secondary | ICD-10-CM | POA: Diagnosis not present

## 2019-01-13 DIAGNOSIS — T148XXD Other injury of unspecified body region, subsequent encounter: Secondary | ICD-10-CM

## 2019-01-13 DIAGNOSIS — I10 Essential (primary) hypertension: Secondary | ICD-10-CM | POA: Diagnosis not present

## 2019-01-13 DIAGNOSIS — G20A1 Parkinson's disease without dyskinesia, without mention of fluctuations: Secondary | ICD-10-CM

## 2019-01-13 NOTE — Progress Notes (Signed)
[facility] [place], [place] [address]  Internal MEDICINE  Office Visit Note  Patient Name: Jacqueline [name]  [id number]  [id number]  Date of Service: 01/13/2019  Chief Complaint  Patient presents with  . Sore    on backside that is not healing , several months .      HPI Pt is here for a sick visit. Pt reports she has noticed a sore on her backside for a few months. She has been using neosporin and sitting on a doughnut.  She has reports this place has become larger.  She denies fever, or other symptoms.    Current Medication:  Outpatient Encounter Medications as of 01/13/2019  Medication Sig  . azelastine (OPTIVAR) 0.05 % ophthalmic solution   . BD PEN NEEDLE NANO U/F 32G X 4 MM MISC USE AS DIRECTED TWICE A DAY  . calcium-vitamin D (OSCAL-500) 500-400 MG-UNIT tablet Take 1 tablet by mouth 2 (two) times daily.  . carbidopa-levodopa (SINEMET IR) 25-100 MG tablet Take 0.5 tablets 5 (five) times daily by mouth.   . Cholecalciferol (VITAMIN D3) 2000 units capsule Take by mouth.  . donepezil (ARICEPT) 10 MG tablet Take 10 mg by mouth at bedtime.  . fluticasone (FLONASE) 50 MCG/ACT nasal spray Place into the nose.  Marland Kitchen glucose blood (FREESTYLE LITE) test strip Use as instructed  . ibuprofen (ADVIL,MOTRIN) 400 MG tablet Take 400 mg by mouth every 6 (six) hours as needed.  . insulin aspart (NOVOLOG) 100 UNIT/ML FlexPen Sliding Scale TID with meals up to 30 units  . Insulin Glargine (LANTUS SOLOSTAR) 100 UNIT/ML Solostar Pen Inject 10 Units daily at 10 pm into the skin.   Marland Kitchen ketorolac (ACULAR) 0.5 % ophthalmic solution INSTILL 1 DROP INTO RIGHT EYE Daily  . levothyroxine (SYNTHROID, LEVOTHROID) 50 MCG tablet Take 50 mcg by mouth daily before breakfast.  . omeprazole (PRILOSEC) 40 MG capsule TAKE 1 CAPSULE BY MOUTH EVERY DAY  . rasagiline (AZILECT) 1 MG TABS tablet Take 1 mg by mouth daily.  . SYMBICORT 80-4.5 MCG/ACT inhaler TAKE 2 PUFFS BY MOUTH TWICE A DAY  .  vitamin E 400 UNIT capsule Take 400 Units by mouth daily.  . memantine (NAMENDA) 5 MG tablet Take 1 tablet daily by mouth.  . [DISCONTINUED] mirabegron ER (MYRBETRIQ) 50 MG TB24 tablet Take 1 tablet (50 mg total) by mouth daily. (Patient not taking: Reported on 03/29/2018)   No facility-administered encounter medications on file as of 01/13/2019.       Medical History: Past Medical History:  Diagnosis Date  . Asthma   . Collapsed lung   . Diabetes mellitus without complication ([place])    Patient takes Insulin twice a day.   Marland Kitchen GERD (gastroesophageal reflux disease)   . Hyperlipidemia   . Hypertension   . Hypothyroidism   . Parkinson's disease ([place])   . Reactive airway disease      Vital Signs: BP 115/81   Pulse (!) 38   Resp 16   Ht 5' 1.5" (1.562 m)   Wt 109 lb (49.4 kg)   SpO2 96%   BMI 20.26 kg/m    Review of Systems  Constitutional: Negative for chills, fatigue and unexpected weight change.  HENT: Negative for congestion, rhinorrhea, sneezing and sore throat.   Eyes: Negative for photophobia, pain and redness.  Respiratory: Negative for cough, chest tightness and shortness of breath.   Cardiovascular: Negative for chest pain and palpitations.  Gastrointestinal: Negative for abdominal pain, constipation, diarrhea, nausea  and vomiting.  Endocrine: Negative.   Genitourinary: Negative for dysuria and frequency.  Musculoskeletal: Negative for arthralgias, back pain, joint swelling and neck pain.  Skin: Positive for wound. Negative for rash.  Allergic/Immunologic: Negative.   Neurological: Negative for tremors and numbness.  Hematological: Negative for adenopathy. Does not bruise/bleed easily.  Psychiatric/Behavioral: Negative for behavioral problems and sleep disturbance. The patient is not nervous/anxious.     Physical Exam Vitals signs and nursing note reviewed.  Constitutional:      General: She is not in acute distress.    Appearance: She is well-developed. She  is not diaphoretic.  HENT:     Head: Normocephalic and atraumatic.     Mouth/Throat:     Pharynx: No oropharyngeal exudate.  Eyes:     Pupils: Pupils are equal, round, and reactive to light.  Neck:     Musculoskeletal: Normal range of motion and neck supple.     Thyroid: No thyromegaly.     Vascular: No JVD.     Trachea: No tracheal deviation.  Cardiovascular:     Rate and Rhythm: Normal rate and regular rhythm.     Heart sounds: Normal heart sounds. No murmur. No friction rub. No gallop.   Pulmonary:     Effort: Pulmonary effort is normal. No respiratory distress.     Breath sounds: Normal breath sounds. No wheezing or rales.  Chest:     Chest wall: No tenderness.  Abdominal:     Palpations: Abdomen is soft.     Tenderness: There is no abdominal tenderness. There is no guarding.  Musculoskeletal: Normal range of motion.  Lymphadenopathy:     Cervical: No cervical adenopathy.  Skin:    General: Skin is warm and dry.     Comments: Wound: gluteal fold, skin abrasion.  Left posterior forearm.  Delayed healing.  Neurological:     Mental Status: She is alert and oriented to person, place, and time.     Cranial Nerves: No cranial nerve deficit.  Psychiatric:        Behavior: Behavior normal.        Thought Content: Thought content normal.        Judgment: Judgment normal.    Assessment/Plan: 1. Wound healing, delayed Wound consult for patient.  - AMB referral to wound care center  2. Parkinson's disease ([place]) Follow up with Dr. Melrose Nakayama as scheduled.  3. Essential hypertension Stable, continue present management.  General Counseling: [place] verbalizes understanding of the findings of todays visit and agrees with plan of treatment. I have discussed any further diagnostic evaluation that may be needed or ordered today. We also reviewed her medications today. she has been encouraged to call the office with any questions or concerns that should arise related to todays  visit.   No orders of the defined types were placed in this encounter.   No orders of the defined types were placed in this encounter.   Time spent: 15 Minutes  This patient was seen by Orson Gear AGNP-C in Collaboration with Dr Lavera Guise as a part of collaborative care agreement.  Kendell Bane AGNP-C Internal Medicine

## 2019-01-19 DIAGNOSIS — R42 Dizziness and giddiness: Secondary | ICD-10-CM | POA: Diagnosis not present

## 2019-01-19 DIAGNOSIS — R413 Other amnesia: Secondary | ICD-10-CM | POA: Diagnosis not present

## 2019-01-19 DIAGNOSIS — G478 Other sleep disorders: Secondary | ICD-10-CM | POA: Diagnosis not present

## 2019-01-19 DIAGNOSIS — I959 Hypotension, unspecified: Secondary | ICD-10-CM | POA: Diagnosis not present

## 2019-01-19 DIAGNOSIS — Z8639 Personal history of other endocrine, nutritional and metabolic disease: Secondary | ICD-10-CM | POA: Diagnosis not present

## 2019-01-19 DIAGNOSIS — G20 Parkinson's disease: Secondary | ICD-10-CM | POA: Diagnosis not present

## 2019-01-30 DIAGNOSIS — I10 Essential (primary) hypertension: Secondary | ICD-10-CM | POA: Diagnosis not present

## 2019-01-30 DIAGNOSIS — R32 Unspecified urinary incontinence: Secondary | ICD-10-CM | POA: Diagnosis not present

## 2019-01-30 DIAGNOSIS — E042 Nontoxic multinodular goiter: Secondary | ICD-10-CM | POA: Diagnosis not present

## 2019-01-30 DIAGNOSIS — E785 Hyperlipidemia, unspecified: Secondary | ICD-10-CM | POA: Diagnosis not present

## 2019-01-30 DIAGNOSIS — E119 Type 2 diabetes mellitus without complications: Secondary | ICD-10-CM | POA: Diagnosis not present

## 2019-01-30 DIAGNOSIS — G20 Parkinson's disease: Secondary | ICD-10-CM | POA: Diagnosis not present

## 2019-01-30 DIAGNOSIS — J45909 Unspecified asthma, uncomplicated: Secondary | ICD-10-CM | POA: Diagnosis not present

## 2019-01-30 DIAGNOSIS — Z794 Long term (current) use of insulin: Secondary | ICD-10-CM | POA: Diagnosis not present

## 2019-01-30 DIAGNOSIS — K219 Gastro-esophageal reflux disease without esophagitis: Secondary | ICD-10-CM | POA: Diagnosis not present

## 2019-01-30 DIAGNOSIS — Z9181 History of falling: Secondary | ICD-10-CM | POA: Diagnosis not present

## 2019-02-01 DIAGNOSIS — R69 Illness, unspecified: Secondary | ICD-10-CM | POA: Diagnosis not present

## 2019-02-05 DIAGNOSIS — E785 Hyperlipidemia, unspecified: Secondary | ICD-10-CM | POA: Diagnosis not present

## 2019-02-05 DIAGNOSIS — J45909 Unspecified asthma, uncomplicated: Secondary | ICD-10-CM | POA: Diagnosis not present

## 2019-02-05 DIAGNOSIS — R32 Unspecified urinary incontinence: Secondary | ICD-10-CM | POA: Diagnosis not present

## 2019-02-05 DIAGNOSIS — E119 Type 2 diabetes mellitus without complications: Secondary | ICD-10-CM | POA: Diagnosis not present

## 2019-02-05 DIAGNOSIS — Z9181 History of falling: Secondary | ICD-10-CM | POA: Diagnosis not present

## 2019-02-05 DIAGNOSIS — Z794 Long term (current) use of insulin: Secondary | ICD-10-CM | POA: Diagnosis not present

## 2019-02-05 DIAGNOSIS — E042 Nontoxic multinodular goiter: Secondary | ICD-10-CM | POA: Diagnosis not present

## 2019-02-05 DIAGNOSIS — G20 Parkinson's disease: Secondary | ICD-10-CM | POA: Diagnosis not present

## 2019-02-05 DIAGNOSIS — I10 Essential (primary) hypertension: Secondary | ICD-10-CM | POA: Diagnosis not present

## 2019-02-05 DIAGNOSIS — K219 Gastro-esophageal reflux disease without esophagitis: Secondary | ICD-10-CM | POA: Diagnosis not present

## 2019-02-08 DIAGNOSIS — E042 Nontoxic multinodular goiter: Secondary | ICD-10-CM | POA: Diagnosis not present

## 2019-02-08 DIAGNOSIS — G20 Parkinson's disease: Secondary | ICD-10-CM | POA: Diagnosis not present

## 2019-02-08 DIAGNOSIS — Z9181 History of falling: Secondary | ICD-10-CM | POA: Diagnosis not present

## 2019-02-08 DIAGNOSIS — E119 Type 2 diabetes mellitus without complications: Secondary | ICD-10-CM | POA: Diagnosis not present

## 2019-02-08 DIAGNOSIS — I10 Essential (primary) hypertension: Secondary | ICD-10-CM | POA: Diagnosis not present

## 2019-02-08 DIAGNOSIS — J45909 Unspecified asthma, uncomplicated: Secondary | ICD-10-CM | POA: Diagnosis not present

## 2019-02-08 DIAGNOSIS — K219 Gastro-esophageal reflux disease without esophagitis: Secondary | ICD-10-CM | POA: Diagnosis not present

## 2019-02-08 DIAGNOSIS — E785 Hyperlipidemia, unspecified: Secondary | ICD-10-CM | POA: Diagnosis not present

## 2019-02-08 DIAGNOSIS — R32 Unspecified urinary incontinence: Secondary | ICD-10-CM | POA: Diagnosis not present

## 2019-02-08 DIAGNOSIS — Z794 Long term (current) use of insulin: Secondary | ICD-10-CM | POA: Diagnosis not present

## 2019-02-10 DIAGNOSIS — R32 Unspecified urinary incontinence: Secondary | ICD-10-CM | POA: Diagnosis not present

## 2019-02-10 DIAGNOSIS — I10 Essential (primary) hypertension: Secondary | ICD-10-CM | POA: Diagnosis not present

## 2019-02-10 DIAGNOSIS — E042 Nontoxic multinodular goiter: Secondary | ICD-10-CM | POA: Diagnosis not present

## 2019-02-10 DIAGNOSIS — K219 Gastro-esophageal reflux disease without esophagitis: Secondary | ICD-10-CM | POA: Diagnosis not present

## 2019-02-10 DIAGNOSIS — G20 Parkinson's disease: Secondary | ICD-10-CM | POA: Diagnosis not present

## 2019-02-10 DIAGNOSIS — E119 Type 2 diabetes mellitus without complications: Secondary | ICD-10-CM | POA: Diagnosis not present

## 2019-02-10 DIAGNOSIS — J45909 Unspecified asthma, uncomplicated: Secondary | ICD-10-CM | POA: Diagnosis not present

## 2019-02-10 DIAGNOSIS — E785 Hyperlipidemia, unspecified: Secondary | ICD-10-CM | POA: Diagnosis not present

## 2019-02-10 DIAGNOSIS — Z794 Long term (current) use of insulin: Secondary | ICD-10-CM | POA: Diagnosis not present

## 2019-02-10 DIAGNOSIS — Z9181 History of falling: Secondary | ICD-10-CM | POA: Diagnosis not present

## 2019-02-15 DIAGNOSIS — Z9181 History of falling: Secondary | ICD-10-CM | POA: Diagnosis not present

## 2019-02-15 DIAGNOSIS — Z794 Long term (current) use of insulin: Secondary | ICD-10-CM | POA: Diagnosis not present

## 2019-02-15 DIAGNOSIS — E042 Nontoxic multinodular goiter: Secondary | ICD-10-CM | POA: Diagnosis not present

## 2019-02-15 DIAGNOSIS — K219 Gastro-esophageal reflux disease without esophagitis: Secondary | ICD-10-CM | POA: Diagnosis not present

## 2019-02-15 DIAGNOSIS — E785 Hyperlipidemia, unspecified: Secondary | ICD-10-CM | POA: Diagnosis not present

## 2019-02-15 DIAGNOSIS — J45909 Unspecified asthma, uncomplicated: Secondary | ICD-10-CM | POA: Diagnosis not present

## 2019-02-15 DIAGNOSIS — E119 Type 2 diabetes mellitus without complications: Secondary | ICD-10-CM | POA: Diagnosis not present

## 2019-02-15 DIAGNOSIS — R32 Unspecified urinary incontinence: Secondary | ICD-10-CM | POA: Diagnosis not present

## 2019-02-15 DIAGNOSIS — I10 Essential (primary) hypertension: Secondary | ICD-10-CM | POA: Diagnosis not present

## 2019-02-15 DIAGNOSIS — G20 Parkinson's disease: Secondary | ICD-10-CM | POA: Diagnosis not present

## 2019-02-17 DIAGNOSIS — G20 Parkinson's disease: Secondary | ICD-10-CM | POA: Diagnosis not present

## 2019-02-17 DIAGNOSIS — E785 Hyperlipidemia, unspecified: Secondary | ICD-10-CM | POA: Diagnosis not present

## 2019-02-17 DIAGNOSIS — E042 Nontoxic multinodular goiter: Secondary | ICD-10-CM | POA: Diagnosis not present

## 2019-02-17 DIAGNOSIS — Z794 Long term (current) use of insulin: Secondary | ICD-10-CM | POA: Diagnosis not present

## 2019-02-17 DIAGNOSIS — K219 Gastro-esophageal reflux disease without esophagitis: Secondary | ICD-10-CM | POA: Diagnosis not present

## 2019-02-17 DIAGNOSIS — Z9181 History of falling: Secondary | ICD-10-CM | POA: Diagnosis not present

## 2019-02-17 DIAGNOSIS — R32 Unspecified urinary incontinence: Secondary | ICD-10-CM | POA: Diagnosis not present

## 2019-02-17 DIAGNOSIS — J45909 Unspecified asthma, uncomplicated: Secondary | ICD-10-CM | POA: Diagnosis not present

## 2019-02-17 DIAGNOSIS — E119 Type 2 diabetes mellitus without complications: Secondary | ICD-10-CM | POA: Diagnosis not present

## 2019-02-17 DIAGNOSIS — I10 Essential (primary) hypertension: Secondary | ICD-10-CM | POA: Diagnosis not present

## 2019-02-20 DIAGNOSIS — I10 Essential (primary) hypertension: Secondary | ICD-10-CM | POA: Diagnosis not present

## 2019-02-20 DIAGNOSIS — R32 Unspecified urinary incontinence: Secondary | ICD-10-CM | POA: Diagnosis not present

## 2019-02-20 DIAGNOSIS — E042 Nontoxic multinodular goiter: Secondary | ICD-10-CM | POA: Diagnosis not present

## 2019-02-20 DIAGNOSIS — Z794 Long term (current) use of insulin: Secondary | ICD-10-CM | POA: Diagnosis not present

## 2019-02-20 DIAGNOSIS — J45909 Unspecified asthma, uncomplicated: Secondary | ICD-10-CM | POA: Diagnosis not present

## 2019-02-20 DIAGNOSIS — E119 Type 2 diabetes mellitus without complications: Secondary | ICD-10-CM | POA: Diagnosis not present

## 2019-02-20 DIAGNOSIS — K219 Gastro-esophageal reflux disease without esophagitis: Secondary | ICD-10-CM | POA: Diagnosis not present

## 2019-02-20 DIAGNOSIS — E785 Hyperlipidemia, unspecified: Secondary | ICD-10-CM | POA: Diagnosis not present

## 2019-02-20 DIAGNOSIS — Z9181 History of falling: Secondary | ICD-10-CM | POA: Diagnosis not present

## 2019-02-20 DIAGNOSIS — G20 Parkinson's disease: Secondary | ICD-10-CM | POA: Diagnosis not present

## 2019-02-24 DIAGNOSIS — E785 Hyperlipidemia, unspecified: Secondary | ICD-10-CM | POA: Diagnosis not present

## 2019-02-24 DIAGNOSIS — G20 Parkinson's disease: Secondary | ICD-10-CM | POA: Diagnosis not present

## 2019-02-24 DIAGNOSIS — Z794 Long term (current) use of insulin: Secondary | ICD-10-CM | POA: Diagnosis not present

## 2019-02-24 DIAGNOSIS — Z9181 History of falling: Secondary | ICD-10-CM | POA: Diagnosis not present

## 2019-02-24 DIAGNOSIS — E119 Type 2 diabetes mellitus without complications: Secondary | ICD-10-CM | POA: Diagnosis not present

## 2019-02-24 DIAGNOSIS — R32 Unspecified urinary incontinence: Secondary | ICD-10-CM | POA: Diagnosis not present

## 2019-02-24 DIAGNOSIS — I10 Essential (primary) hypertension: Secondary | ICD-10-CM | POA: Diagnosis not present

## 2019-02-24 DIAGNOSIS — J45909 Unspecified asthma, uncomplicated: Secondary | ICD-10-CM | POA: Diagnosis not present

## 2019-02-24 DIAGNOSIS — E042 Nontoxic multinodular goiter: Secondary | ICD-10-CM | POA: Diagnosis not present

## 2019-02-24 DIAGNOSIS — K219 Gastro-esophageal reflux disease without esophagitis: Secondary | ICD-10-CM | POA: Diagnosis not present

## 2019-02-27 ENCOUNTER — Other Ambulatory Visit: Payer: Self-pay

## 2019-02-27 DIAGNOSIS — R32 Unspecified urinary incontinence: Secondary | ICD-10-CM | POA: Diagnosis not present

## 2019-02-27 DIAGNOSIS — K219 Gastro-esophageal reflux disease without esophagitis: Secondary | ICD-10-CM | POA: Diagnosis not present

## 2019-02-27 DIAGNOSIS — E785 Hyperlipidemia, unspecified: Secondary | ICD-10-CM | POA: Diagnosis not present

## 2019-02-27 DIAGNOSIS — G20 Parkinson's disease: Secondary | ICD-10-CM | POA: Diagnosis not present

## 2019-02-27 DIAGNOSIS — I10 Essential (primary) hypertension: Secondary | ICD-10-CM | POA: Diagnosis not present

## 2019-02-27 DIAGNOSIS — E042 Nontoxic multinodular goiter: Secondary | ICD-10-CM | POA: Diagnosis not present

## 2019-02-27 DIAGNOSIS — Z9181 History of falling: Secondary | ICD-10-CM | POA: Diagnosis not present

## 2019-02-27 DIAGNOSIS — Z794 Long term (current) use of insulin: Secondary | ICD-10-CM | POA: Diagnosis not present

## 2019-02-27 DIAGNOSIS — J45909 Unspecified asthma, uncomplicated: Secondary | ICD-10-CM | POA: Diagnosis not present

## 2019-02-27 DIAGNOSIS — E119 Type 2 diabetes mellitus without complications: Secondary | ICD-10-CM | POA: Diagnosis not present

## 2019-02-27 MED ORDER — BD PEN NEEDLE NANO U/F 32G X 4 MM MISC: 0 refills | Status: DC

## 2019-02-27 MED ORDER — OMEPRAZOLE 40 MG PO CPDR: DELAYED_RELEASE_CAPSULE | ORAL | 1 refills | Status: DC

## 2019-03-02 DIAGNOSIS — R69 Illness, unspecified: Secondary | ICD-10-CM | POA: Diagnosis not present

## 2019-03-09 DIAGNOSIS — E042 Nontoxic multinodular goiter: Secondary | ICD-10-CM | POA: Diagnosis not present

## 2019-03-09 DIAGNOSIS — E119 Type 2 diabetes mellitus without complications: Secondary | ICD-10-CM | POA: Diagnosis not present

## 2019-03-09 DIAGNOSIS — Z794 Long term (current) use of insulin: Secondary | ICD-10-CM | POA: Diagnosis not present

## 2019-03-09 DIAGNOSIS — Z9181 History of falling: Secondary | ICD-10-CM | POA: Diagnosis not present

## 2019-03-09 DIAGNOSIS — G20 Parkinson's disease: Secondary | ICD-10-CM | POA: Diagnosis not present

## 2019-03-09 DIAGNOSIS — K219 Gastro-esophageal reflux disease without esophagitis: Secondary | ICD-10-CM | POA: Diagnosis not present

## 2019-03-09 DIAGNOSIS — R32 Unspecified urinary incontinence: Secondary | ICD-10-CM | POA: Diagnosis not present

## 2019-03-09 DIAGNOSIS — I10 Essential (primary) hypertension: Secondary | ICD-10-CM | POA: Diagnosis not present

## 2019-03-09 DIAGNOSIS — E785 Hyperlipidemia, unspecified: Secondary | ICD-10-CM | POA: Diagnosis not present

## 2019-03-09 DIAGNOSIS — J45909 Unspecified asthma, uncomplicated: Secondary | ICD-10-CM | POA: Diagnosis not present

## 2019-03-22 DIAGNOSIS — G20 Parkinson's disease: Secondary | ICD-10-CM | POA: Diagnosis not present

## 2019-03-22 DIAGNOSIS — R42 Dizziness and giddiness: Secondary | ICD-10-CM | POA: Diagnosis not present

## 2019-03-22 DIAGNOSIS — I959 Hypotension, unspecified: Secondary | ICD-10-CM | POA: Diagnosis not present

## 2019-03-22 DIAGNOSIS — Z8639 Personal history of other endocrine, nutritional and metabolic disease: Secondary | ICD-10-CM | POA: Diagnosis not present

## 2019-03-22 DIAGNOSIS — R413 Other amnesia: Secondary | ICD-10-CM | POA: Diagnosis not present

## 2019-03-22 DIAGNOSIS — B351 Tinea unguium: Secondary | ICD-10-CM | POA: Diagnosis not present

## 2019-03-22 DIAGNOSIS — E1142 Type 2 diabetes mellitus with diabetic polyneuropathy: Secondary | ICD-10-CM | POA: Diagnosis not present

## 2019-03-22 DIAGNOSIS — L851 Acquired keratosis [keratoderma] palmaris et plantaris: Secondary | ICD-10-CM | POA: Diagnosis not present

## 2019-03-22 DIAGNOSIS — G478 Other sleep disorders: Secondary | ICD-10-CM | POA: Diagnosis not present

## 2019-03-27 ENCOUNTER — Other Ambulatory Visit: Payer: Self-pay | Admitting: Neurology

## 2019-03-27 ENCOUNTER — Other Ambulatory Visit: Payer: Self-pay | Admitting: Internal Medicine

## 2019-03-27 DIAGNOSIS — R1319 Other dysphagia: Secondary | ICD-10-CM

## 2019-03-31 DIAGNOSIS — R69 Illness, unspecified: Secondary | ICD-10-CM | POA: Diagnosis not present

## 2019-04-07 ENCOUNTER — Ambulatory Visit: Payer: Medicare HMO | Attending: Neurology

## 2019-04-20 DIAGNOSIS — R69 Illness, unspecified: Secondary | ICD-10-CM | POA: Diagnosis not present

## 2019-04-24 ENCOUNTER — Emergency Department: Payer: Medicare HMO

## 2019-04-24 ENCOUNTER — Encounter: Payer: Self-pay | Admitting: Emergency Medicine

## 2019-04-24 ENCOUNTER — Other Ambulatory Visit: Payer: Self-pay

## 2019-04-24 DIAGNOSIS — R000 Tachycardia, unspecified: Secondary | ICD-10-CM | POA: Diagnosis not present

## 2019-04-24 DIAGNOSIS — R0602 Shortness of breath: Secondary | ICD-10-CM | POA: Insufficient documentation

## 2019-04-24 DIAGNOSIS — Z20828 Contact with and (suspected) exposure to other viral communicable diseases: Secondary | ICD-10-CM | POA: Insufficient documentation

## 2019-04-24 DIAGNOSIS — E119 Type 2 diabetes mellitus without complications: Secondary | ICD-10-CM | POA: Diagnosis not present

## 2019-04-24 DIAGNOSIS — G20 Parkinson's disease: Secondary | ICD-10-CM | POA: Diagnosis not present

## 2019-04-24 DIAGNOSIS — E039 Hypothyroidism, unspecified: Secondary | ICD-10-CM | POA: Insufficient documentation

## 2019-04-24 DIAGNOSIS — R069 Unspecified abnormalities of breathing: Secondary | ICD-10-CM | POA: Diagnosis not present

## 2019-04-24 DIAGNOSIS — J45909 Unspecified asthma, uncomplicated: Secondary | ICD-10-CM | POA: Diagnosis not present

## 2019-04-24 DIAGNOSIS — R69 Illness, unspecified: Secondary | ICD-10-CM | POA: Diagnosis not present

## 2019-04-24 DIAGNOSIS — Z794 Long term (current) use of insulin: Secondary | ICD-10-CM | POA: Insufficient documentation

## 2019-04-24 DIAGNOSIS — I10 Essential (primary) hypertension: Secondary | ICD-10-CM | POA: Insufficient documentation

## 2019-04-24 DIAGNOSIS — Z79899 Other long term (current) drug therapy: Secondary | ICD-10-CM | POA: Insufficient documentation

## 2019-04-24 DIAGNOSIS — F0150 Vascular dementia without behavioral disturbance: Secondary | ICD-10-CM | POA: Insufficient documentation

## 2019-04-24 LAB — COMPREHENSIVE METABOLIC PANEL
ALT: <5 U/L (ref 0–44)
AST: 17 U/L (ref 15–41)
Albumin: 3.5 g/dL (ref 3.5–5.0)
Alkaline Phosphatase: 97 U/L (ref 38–126)
Anion gap: 12 (ref 5–15)
BUN: 24 mg/dL — ABNORMAL HIGH (ref 8–23)
CO2: 27 mmol/L (ref 22–32)
Calcium: 9.7 mg/dL (ref 8.9–10.3)
Chloride: 100 mmol/L (ref 98–111)
Creatinine, Ser: 1.28 mg/dL — ABNORMAL HIGH (ref 0.44–1.00)
GFR calc Af Amer: 45 mL/min — ABNORMAL LOW (ref >60)
GFR calc non Af Amer: 39 mL/min — ABNORMAL LOW (ref >60)
Glucose, Bld: 176 mg/dL — ABNORMAL HIGH (ref 70–99)
Potassium: 3.7 mmol/L (ref 3.5–5.1)
Sodium: 139 mmol/L (ref 135–145)
Total Bilirubin: 0.8 mg/dL (ref 0.3–1.2)
Total Protein: 6.6 g/dL (ref 6.5–8.1)

## 2019-04-24 LAB — CBC WITH DIFFERENTIAL/PLATELET
Abs Immature Granulocytes: 0.02 10*3/uL (ref 0.00–0.07)
Basophils Absolute: 0 10*3/uL (ref 0.0–0.1)
Basophils Relative: 1 %
Eosinophils Absolute: 0.1 10*3/uL (ref 0.0–0.5)
Eosinophils Relative: 1 %
HCT: 40.6 % (ref 36.0–46.0)
Hemoglobin: 14 g/dL (ref 12.0–15.0)
Immature Granulocytes: 0 %
Lymphocytes Relative: 24 %
Lymphs Abs: 1.5 10*3/uL (ref 0.7–4.0)
MCH: 29.9 pg (ref 26.0–34.0)
MCHC: 34.5 g/dL (ref 30.0–36.0)
MCV: 86.6 fL (ref 80.0–100.0)
Monocytes Absolute: 0.6 10*3/uL (ref 0.1–1.0)
Monocytes Relative: 9 %
Neutro Abs: 4.2 10*3/uL (ref 1.7–7.7)
Neutrophils Relative %: 65 %
Platelets: 184 10*3/uL (ref 150–400)
RBC: 4.69 MIL/uL (ref 3.87–5.11)
RDW: 12.9 % (ref 11.5–15.5)
WBC: 6.4 10*3/uL (ref 4.0–10.5)
nRBC: 0 % (ref 0.0–0.2)

## 2019-04-24 LAB — TROPONIN I (HIGH SENSITIVITY): Troponin I (High Sensitivity): 9 ng/L (ref <18)

## 2019-04-24 NOTE — ED Triage Notes (Signed)
Pt to triage via w/c with no distress noted, mask in place; pt reports SHOB and sweats since last night; denies cough, denies pain

## 2019-04-25 ENCOUNTER — Other Ambulatory Visit: Payer: Self-pay

## 2019-04-25 ENCOUNTER — Emergency Department
Admission: EM | Admit: 2019-04-25 | Discharge: 2019-04-25 | Disposition: A | Discharge status: Home / Self Care | Payer: Medicare HMO | Attending: Student | Admitting: Student

## 2019-04-25 DIAGNOSIS — R0602 Shortness of breath: Secondary | ICD-10-CM

## 2019-04-25 LAB — TROPONIN I (HIGH SENSITIVITY): Troponin I (High Sensitivity): 9 ng/L (ref <18)

## 2019-04-25 LAB — BLOOD GAS, VENOUS
Acid-Base Excess: 4.3 mmol/L — ABNORMAL HIGH (ref 0.0–2.0)
Bicarbonate: 31.5 mmol/L — ABNORMAL HIGH (ref 20.0–28.0)
O2 Saturation: 55.7 %
Patient temperature: 37
pCO2, Ven: 57 mmHg (ref 44.0–60.0)
pH, Ven: 7.35 (ref 7.250–7.430)
pO2, Ven: 31 mmHg — CL (ref 32.0–45.0)

## 2019-04-25 LAB — URINALYSIS, COMPLETE (UACMP) WITH MICROSCOPIC
Bacteria, UA: NONE SEEN
Bilirubin Urine: NEGATIVE
Glucose, UA: NEGATIVE mg/dL
Ketones, ur: NEGATIVE mg/dL
Leukocytes,Ua: NEGATIVE
Nitrite: NEGATIVE
Protein, ur: NEGATIVE mg/dL
Specific Gravity, Urine: 1.004 — ABNORMAL LOW (ref 1.005–1.030)
pH: 6 (ref 5.0–8.0)

## 2019-04-25 LAB — SARS CORONAVIRUS 2 (TAT 6-24 HRS): SARS Coronavirus 2: NEGATIVE

## 2019-04-25 NOTE — Discharge Instructions (Signed)
Thank you for letting us take care of you in the emergency department today.   Someone will call you in 24-48 hours if your COVID swab is positive.  In the meantime, please be sure to follow proper social distancing, hygiene, mask wearing.  Please continue to take any regular, prescribed medications.   Please follow up with: - Your primary care doctor to review your ER visit and follow up on your symptoms.   Please return to the ER for any new or worsening symptoms.

## 2019-04-25 NOTE — ED Provider Notes (Signed)
[facility] Emergency Department Provider Note  ____________________________________________   First MD Initiated Contact with Patient 04/25/19 [id number]     (approximate)  I have reviewed the triage vital signs and the nursing notes.  History  Chief Complaint Shortness of Breath    HPI Jacqueline [name] is a 83 y.o. female with history of dementia, Parkinson's, DM, asthma/RAD, hypothyroidism who presents to the emergency department for shortness of breath.  Patient reports chronic shortness of breath, but feels like it has worsened over the last several days.  She denies any associated fevers, cough, chest pain, diaphoresis, leg swelling, or history of VTE.  She reports compliance with all of her medications, including her inhalers.  She denies any tobacco use.  Daughter at bedside does note that the patient has seemed slightly more confused than her baseline.  She reports history of similar symptoms when the patient has had a UTI.   Past Medical Hx Past Medical History:  Diagnosis Date  . Asthma   . Collapsed lung   . Diabetes mellitus without complication ([place])    Patient takes Insulin twice a day.   Marland Kitchen GERD (gastroesophageal reflux disease)   . Hyperlipidemia   . Hypertension   . Hypothyroidism   . Parkinson's disease ([place])   . Reactive airway disease     Problem List Patient Active Problem List   Diagnosis Date Noted  . Encounter for general adult medical examination with abnormal findings 06/21/2018  . Uncontrolled type 2 diabetes mellitus with hyperglycemia ([place]) 06/21/2018  . Asthma without status asthmaticus 07/08/2016  . Hyperlipidemia, unspecified 07/08/2016  . Dysphagia 01/07/2016  . Unsteadiness 10/01/2015  . Type 2 diabetes mellitus without complication, without long-term current use of insulin ([place]) 08/07/2015  . Mixed Alzheimer's and vascular dementia ([place]) 08/06/2015  . Controlled type 2 diabetes mellitus without complication, without  long-term current use of insulin ([place]) 02/12/2015  . Diabetes type 2, controlled ([place]) 06/19/2014  . Essential hypertension 03/08/2014  . Parkinson's disease ([place]) 01/16/2014  . Fatigue 11/09/2013  . Diabetes mellitus, type II ([place]) 09/15/2013  . Difficulty walking 09/15/2013  . Dizziness 09/15/2013    Past Surgical Hx Past Surgical History:  Procedure Laterality Date  . ABDOMINAL HYSTERECTOMY    . CHOLECYSTECTOMY    . COLONOSCOPY WITH PROPOFOL N/A 03/29/2015   Procedure: COLONOSCOPY WITH PROPOFOL;  Surgeon: Hulen Luster, MD;  Location[facility] END[facility]COPY;  Service: Gastroenterology;  Laterality: N/A;  . ESOPHAGOGASTRODUODENOSCOPY (EGD) WITH PROPOFOL N/A 03/29/2015   Procedure: ESOPHAGOGASTRODUODENOSCOPY (EGD) WITH PROPOFOL;  Surgeon: Hulen Luster, MD;  Location: [facility] ENDOSCOPY;  Service: Gastroenterology;  Laterality: N/A;  . lung collapse     broken rib from fall punctured lung    Medications Prior to Admission medications   Medication Sig Start Date End Date Taking? Authorizing Provider  azelastine (OPTIVAR) 0.05 % ophthalmic solution  08/26/17   [provider]  BD PEN NEEDLE NANO U/F 32G X 4 MM MISC USE AS DIRECTED TWICE A DAY 03/27/19   [place], Audie Clear, NP  calcium-vitamin D (OSCAL-500) 500-400 MG-UNIT tablet Take 1 tablet by mouth 2 (two) times daily.    [provider]  carbidopa-levodopa (SINEMET IR) 25-100 MG tablet Take 0.5 tablets 5 (five) times daily by mouth.  07/07/16   [provider]  Cholecalciferol (VITAMIN D3) 2000 units capsule Take by mouth.    [provider]  donepezil (ARICEPT) 10 MG tablet Take 10 mg by mouth at bedtime.    [provider]  fluticasone (FLONASE) 50 MCG/ACT nasal spray Place into the nose.    [provider]  glucose blood (FREESTYLE LITE) test strip Use as instructed 04/16/15   Nance Pear, MD  ibuprofen (ADVIL,MOTRIN) 400 MG tablet Take 400 mg by mouth every 6 (six) hours as needed.     [provider]  insulin aspart (NOVOLOG) 100 UNIT/ML FlexPen Sliding Scale TID with meals up to 30 units 09/03/15   [provider]  Insulin Glargine (LANTUS SOLOSTAR) 100 UNIT/ML Solostar Pen Inject 10 Units daily at 10 pm into the skin.  10/01/15 05/12/20  [provider]  ketorolac (ACULAR) 0.5 % ophthalmic solution INSTILL 1 DROP INTO RIGHT EYE Daily 12/18/15   [provider]  levothyroxine (SYNTHROID, LEVOTHROID) 50 MCG tablet Take 50 mcg by mouth daily before breakfast.    [provider]  memantine (NAMENDA) 5 MG tablet Take 1 tablet daily by mouth. 04/13/17 04/13/18  [provider]  omeprazole (PRILOSEC) 40 MG capsule TAKE 1 CAPSULE BY MOUTH EVERY DAY 02/27/19   Kendell Bane, NP  rasagiline (AZILECT) 1 MG TABS tablet Take 1 mg by mouth daily.    [provider]  SYMBICORT 80-4.5 MCG/ACT inhaler TAKE 2 PUFFS BY MOUTH TWICE A DAY 05/18/18   Lavera Guise, MD  vitamin E 400 UNIT capsule Take 400 Units by mouth daily.    [provider]    Allergies Patient has no known allergies.  Family Hx Family History  Problem Relation Age of Onset  . Bladder Cancer Neg Hx   . Kidney cancer Neg Hx     Social Hx Social History   Tobacco Use  . Smoking status: Never Smoker  . Smokeless tobacco: Never Used  Substance Use Topics  . Alcohol use: Not Currently  . Drug use: No     Review of Systems  Constitutional: Negative for fever, chills. Eyes: Negative for visual changes. ENT: Negative for sore throat. Cardiovascular: Negative for chest pain. Respiratory: + for shortness of breath. Gastrointestinal: Negative for nausea, vomiting.  Genitourinary: Negative for dysuria. Musculoskeletal: Negative for leg swelling. Skin: Negative for rash. Neurological: Negative for for headaches.   Physical Exam  Vital Signs: ED Triage Vitals  Enc Vitals Group     BP 04/24/19 2117 110/80     Pulse Rate 04/24/19 2117 79      Resp 04/24/19 2117 20     Temp 04/24/19 2117 97.6 F (36.4 C)     Temp Source 04/24/19 2117 Oral     SpO2 04/24/19 2117 95 %     Weight 04/24/19 2115 104 lb (47.2 kg)     Height 04/24/19 2115 5\' 1"  (1.549 m)     Head Circumference --      Peak Flow --      Pain Score 04/24/19 2114 0     Pain Loc --      Pain Edu? --      Excl. in [place]? --     Constitutional: Alert and oriented.  Soft-spoken. Head: Normocephalic. Atraumatic. Eyes: Conjunctivae clear. Sclera anicteric. Nose: No congestion. No rhinorrhea. Mouth/Throat: Wearing mask.  Neck: No stridor.   Cardiovascular: Normal rate, regular rhythm. Extremities well perfused. Respiratory: Normal respiratory effort.  Lungs CTAB.  No wheezing. Gastrointestinal: Soft. Non-tender. Non-distended.  Musculoskeletal: No lower extremity edema. No deformities. Neurologic:  Normal speech and language. No gross focal neurologic deficits are appreciated.  Skin: Skin is warm, dry and intact. No rash noted. Psychiatric:  Mood and affect are appropriate for situation.  EKG  Personally reviewed. 04/25/19 at 1:11 AM.  Rate: 71 Rhythm: sinus Axis: normal Intervals: WNL No acute ischemic changes No STEMI    Radiology  XR: IMPRESSION:  COPD/chronic changes. No active disease.    Procedures  Procedure(s) performed (including critical care):  Procedures   Initial Impression / Assessment and Plan / ED Course  83 y.o. female who presents to the ED for shortness of breath, as above.  Exam is reassuring, with good air movement bilaterally, no wheezing, no decreased breath sounds.  Ddx: RAD exacerbation, bronchitis, PNA, atypical ACS. No evidence of leg swelling suggestive of HF exacerbation. No hx of VTE. Consider hypercarbia/UTI with regards to increased confusion above baseline.   XR reveals no active disease.  EKG without acute ischemic changes. Troponin x 2 negative. No evidence of hypercarbia.  UA negative for infection.  Given  negative work-up, feel patient is appropriate for discharge with outpatient follow-up.  Patient and daughter are agreeable.  COVID swab obtained prior to discharge, given patient has several nurse aids visit her throughout the day.  Patient and daughter voiced understanding that the results will come in the next 1 to 2 days, discussed proper hygiene/quarantining/social distancing while awaiting results.  They voiced understanding are comfortable with the plan and discharge.  Given return precautions.   Final Clinical Impression(s) / ED Diagnosis  Final diagnoses:  SOB (shortness of breath)       Note:  This document was prepared using Dragon voice recognition software and may include unintentional dictation errors.   Lilia Pro., MD 04/25/19 [phone]

## 2019-04-26 DIAGNOSIS — Z794 Long term (current) use of insulin: Secondary | ICD-10-CM | POA: Diagnosis not present

## 2019-04-26 DIAGNOSIS — R809 Proteinuria, unspecified: Secondary | ICD-10-CM | POA: Diagnosis not present

## 2019-04-26 DIAGNOSIS — E782 Mixed hyperlipidemia: Secondary | ICD-10-CM | POA: Diagnosis not present

## 2019-04-26 DIAGNOSIS — E1159 Type 2 diabetes mellitus with other circulatory complications: Secondary | ICD-10-CM | POA: Diagnosis not present

## 2019-04-26 DIAGNOSIS — E1129 Type 2 diabetes mellitus with other diabetic kidney complication: Secondary | ICD-10-CM | POA: Diagnosis not present

## 2019-04-26 DIAGNOSIS — I10 Essential (primary) hypertension: Secondary | ICD-10-CM | POA: Diagnosis not present

## 2019-04-26 DIAGNOSIS — E039 Hypothyroidism, unspecified: Secondary | ICD-10-CM | POA: Diagnosis not present

## 2019-04-26 DIAGNOSIS — E042 Nontoxic multinodular goiter: Secondary | ICD-10-CM | POA: Diagnosis not present

## 2019-04-29 DIAGNOSIS — R69 Illness, unspecified: Secondary | ICD-10-CM | POA: Diagnosis not present

## 2019-05-03 DIAGNOSIS — Z794 Long term (current) use of insulin: Secondary | ICD-10-CM | POA: Diagnosis not present

## 2019-05-03 DIAGNOSIS — I10 Essential (primary) hypertension: Secondary | ICD-10-CM | POA: Diagnosis not present

## 2019-05-03 DIAGNOSIS — E1129 Type 2 diabetes mellitus with other diabetic kidney complication: Secondary | ICD-10-CM | POA: Diagnosis not present

## 2019-05-03 DIAGNOSIS — R809 Proteinuria, unspecified: Secondary | ICD-10-CM | POA: Diagnosis not present

## 2019-05-03 DIAGNOSIS — E042 Nontoxic multinodular goiter: Secondary | ICD-10-CM | POA: Diagnosis not present

## 2019-05-03 DIAGNOSIS — E039 Hypothyroidism, unspecified: Secondary | ICD-10-CM | POA: Diagnosis not present

## 2019-05-03 DIAGNOSIS — E1159 Type 2 diabetes mellitus with other circulatory complications: Secondary | ICD-10-CM | POA: Diagnosis not present

## 2019-05-10 DIAGNOSIS — I959 Hypotension, unspecified: Secondary | ICD-10-CM | POA: Diagnosis not present

## 2019-05-10 DIAGNOSIS — R413 Other amnesia: Secondary | ICD-10-CM | POA: Diagnosis not present

## 2019-05-10 DIAGNOSIS — G20 Parkinson's disease: Secondary | ICD-10-CM | POA: Diagnosis not present

## 2019-05-10 DIAGNOSIS — G478 Other sleep disorders: Secondary | ICD-10-CM | POA: Diagnosis not present

## 2019-05-10 DIAGNOSIS — Z8639 Personal history of other endocrine, nutritional and metabolic disease: Secondary | ICD-10-CM | POA: Diagnosis not present

## 2019-05-17 ENCOUNTER — Other Ambulatory Visit: Payer: Self-pay | Admitting: Neurology

## 2019-05-17 DIAGNOSIS — R1319 Other dysphagia: Secondary | ICD-10-CM

## 2019-05-17 DIAGNOSIS — R633 Feeding difficulties, unspecified: Secondary | ICD-10-CM

## 2019-06-06 DIAGNOSIS — R69 Illness, unspecified: Secondary | ICD-10-CM | POA: Diagnosis not present

## 2019-06-12 ENCOUNTER — Other Ambulatory Visit: Payer: Self-pay

## 2019-06-12 ENCOUNTER — Encounter: Payer: Self-pay | Admitting: Nurse Practitioner

## 2019-06-12 ENCOUNTER — Ambulatory Visit (INDEPENDENT_AMBULATORY_CARE_PROVIDER_SITE_OTHER): Payer: Medicare HMO | Admitting: Nurse Practitioner

## 2019-06-12 VITALS — BP 120/77 | HR 88 | Temp 96.9°F | Wt 104.0 lb

## 2019-06-12 DIAGNOSIS — T148XXD Other injury of unspecified body region, subsequent encounter: Secondary | ICD-10-CM | POA: Insufficient documentation

## 2019-06-12 DIAGNOSIS — I10 Essential (primary) hypertension: Secondary | ICD-10-CM

## 2019-06-12 DIAGNOSIS — E119 Type 2 diabetes mellitus without complications: Secondary | ICD-10-CM | POA: Diagnosis not present

## 2019-06-12 DIAGNOSIS — L89159 Pressure ulcer of sacral region, unspecified stage: Secondary | ICD-10-CM

## 2019-06-12 DIAGNOSIS — L89151 Pressure ulcer of sacral region, stage 1: Secondary | ICD-10-CM | POA: Insufficient documentation

## 2019-06-12 DIAGNOSIS — G20 Parkinson's disease: Secondary | ICD-10-CM

## 2019-06-12 DIAGNOSIS — G20A1 Parkinson's disease without dyskinesia, without mention of fluctuations: Secondary | ICD-10-CM

## 2019-06-12 MED ORDER — SULFAMETHOXAZOLE-TRIMETHOPRIM 400-80 MG PO TABS: 1.0000 | ORAL_TABLET | Freq: Two times a day (BID) | ORAL | 0 refills | Status: DC

## 2019-06-12 MED ORDER — LIDOCAINE VISCOUS HCL 2 % MT SOLN: OROMUCOSAL | 0 refills | Status: DC

## 2019-06-12 NOTE — Progress Notes (Signed)
[facility] [place], [place] [address]  Internal MEDICINE  Telephone Visit  Patient Name: Jacqueline [name]  [id number]  [id number]  Date of Service: 06/14/2019  I connected with the patient at 12:30pm by telephone and verified the patients identity using two identifiers.   I discussed the limitations, risks, security and privacy concerns of performing an evaluation and management service by telephone and the availability of in person appointments. I also discussed with the patient that there may be a patient responsible charge related to the service.  The patient expressed understanding and agrees to proceed.    Chief Complaint  Patient presents with  . Telephone Assessment    blood sugar 168  . Telephone Screen  . Nasal Congestion  . Shortness of Breath  . Leg Problem    bad cramps on both legs  . Night Sweats    woke up sweating at 3:30 in the morning  . Fatigue  . Open Wound    on buttocks, it had healed but now its back. was seen by [place] for it     The patient has been contacted via telephone for follow up visit due to concerns for spread of novel coronavirus. The patient presents for acute visit. She states she started having difficult time breathing. She got sweaty, her legs were cramping, and she started having nasal congestion this morning. She was weak, and felt like she was going to fall and felt very shaky. The patient does have history of Parkinson's disease and diabetes. Unsure is symptoms are related to this or to some sort of virus.  She also has wound in the gluteal fold. Was seen for this back in August. She was referred to wound care. The wound healed on it's own and she did not go to wound management. It has recently returned. It is very tender to sit and lay on the back due to the wound.        Current Medication: Outpatient Encounter Medications as of 06/12/2019  Medication Sig  . azelastine (OPTIVAR) 0.05 % ophthalmic solution   . BD  PEN NEEDLE NANO U/F 32G X 4 MM MISC USE AS DIRECTED TWICE A DAY  . calcium-vitamin D (OSCAL-500) 500-400 MG-UNIT tablet Take 1 tablet by mouth 2 (two) times daily.  . carbidopa-levodopa (SINEMET IR) 25-100 MG tablet Take 0.5 tablets 5 (five) times daily by mouth.   . Cholecalciferol (VITAMIN D3) 2000 units capsule Take by mouth.  . donepezil (ARICEPT) 10 MG tablet Take 10 mg by mouth at bedtime.  . fluticasone (FLONASE) 50 MCG/ACT nasal spray Place into the nose.  Marland Kitchen glucose blood (FREESTYLE LITE) test strip Use as instructed  . ibuprofen (ADVIL,MOTRIN) 400 MG tablet Take 400 mg by mouth every 6 (six) hours as needed.  . insulin aspart (NOVOLOG) 100 UNIT/ML FlexPen Sliding Scale TID with meals up to 30 units  . Insulin Glargine (LANTUS SOLOSTAR) 100 UNIT/ML Solostar Pen Inject 10 Units daily at 10 pm into the skin.   Marland Kitchen ketorolac (ACULAR) 0.5 % ophthalmic solution INSTILL 1 DROP INTO RIGHT EYE Daily  . levothyroxine (SYNTHROID, LEVOTHROID) 50 MCG tablet Take 50 mcg by mouth daily before breakfast.  . omeprazole (PRILOSEC) 40 MG capsule TAKE 1 CAPSULE BY MOUTH EVERY DAY  . rasagiline (AZILECT) 1 MG TABS tablet Take 1 mg by mouth daily.  . SYMBICORT 80-4.5 MCG/ACT inhaler TAKE 2 PUFFS BY MOUTH TWICE A DAY  . vitamin E 400 UNIT capsule Take 400 Units  by mouth daily.  Marland Kitchen lidocaine (XYLOCAINE) 2 % solution Apply small amount to affected area three to four times daily as needed for pain.  . memantine (NAMENDA) 5 MG tablet Take 1 tablet daily by mouth.  . sulfamethoxazole-trimethoprim (BACTRIM) 400-80 MG tablet Take 1 tablet by mouth 2 (two) times daily.   No facility-administered encounter medications on file as of 06/12/2019.    Surgical History: Past Surgical History:  Procedure Laterality Date  . ABDOMINAL HYSTERECTOMY    . CHOLECYSTECTOMY    . COLONOSCOPY WITH PROPOFOL N/A 03/29/2015   Procedure: COLONOSCOPY WITH PROPOFOL;  Surgeon: Hulen Luster, MD;  Location: [facility] ENDOSCOPY;  Service:  Gastroenterology;  Laterality: N/A;  . ESOPHAGOGASTRODUODENOSCOPY (EGD) WITH PROPOFOL N/A 03/29/2015   Procedure: ESOPHAGOGASTRODUODENOSCOPY (EGD) WITH PROPOFOL;  Surgeon: Hulen Luster, MD;  Location: [facility] ENDOSCOPY;  Service: Gastroenterology;  Laterality: N/A;  . lung collapse     broken rib from fall punctured lung    Medical History: Past Medical History:  Diagnosis Date  . Asthma   . Collapsed lung   . Diabetes mellitus without complication ([place])    Patient takes Insulin twice a day.   Marland Kitchen GERD (gastroesophageal reflux disease)   . Hyperlipidemia   . Hypertension   . Hypothyroidism   . Parkinson's disease ([place])   . Reactive airway disease     Family History: Family History  Problem Relation Age of Onset  . Bladder Cancer Neg Hx   . Kidney cancer Neg Hx     Social History   Socioeconomic History  . Marital status: Widowed    Spouse name: Not on file  . Number of children: Not on file  . Years of education: Not on file  . Highest education level: Not on file  Occupational History  . Not on file  Tobacco Use  . Smoking status: Never Smoker  . Smokeless tobacco: Never Used  Substance and Sexual Activity  . Alcohol use: Not Currently  . Drug use: No  . Sexual activity: Not on file  Other Topics Concern  . Not on file  Social History Narrative  . Not on file   Social Determinants of Health   Financial Resource Strain:   . Difficulty of Paying Living Expenses: Not on file  Food Insecurity:   . Worried About Charity fundraiser in the Last Year: Not on file  . Ran Out of Food in the Last Year: Not on file  Transportation Needs:   . Lack of Transportation (Medical): Not on file  . Lack of Transportation (Non-Medical): Not on file  Physical Activity:   . Days of Exercise per Week: Not on file  . Minutes of Exercise per Session: Not on file  Stress:   . Feeling of Stress : Not on file  Social Connections:   . Frequency of Communication with Friends and Family:  Not on file  . Frequency of Social Gatherings with Friends and Family: Not on file  . Attends Religious Services: Not on file  . Active Member of Clubs or Organizations: Not on file  . Attends Archivist Meetings: Not on file  . Marital Status: Not on file  Intimate Partner Violence:   . Fear of Current or Ex-Partner: Not on file  . Emotionally Abused: Not on file  . Physically Abused: Not on file  . Sexually Abused: Not on file      Review of Systems  Constitutional: Positive for fatigue. Negative for chills, fever and  unexpected weight change.  HENT: Positive for congestion and postnasal drip. Negative for rhinorrhea, sneezing and sore throat.   Respiratory: Negative for cough, chest tightness and shortness of breath.   Cardiovascular: Negative for chest pain and palpitations.  Gastrointestinal: Negative for abdominal pain, constipation, diarrhea, nausea and vomiting.  Musculoskeletal: Negative for arthralgias, back pain, joint swelling and neck pain.  Skin: Positive for wound. Negative for rash.       Wound to gluteal fold of the buttocks.  Very tender. Hurts to sit or lay on her back due to pain.   Neurological: Positive for dizziness and weakness. Negative for tremors and numbness.  Hematological: Negative for adenopathy. Does not bruise/bleed easily.  Psychiatric/Behavioral: Negative for behavioral problems and sleep disturbance. The patient is not nervous/anxious.    Today's Vitals   06/12/19 1059  BP: 120/77  Pulse: 88  Temp: (!) 96.9 F (36.1 C)  Weight: 104 lb (47.2 kg)   Body mass index is 19.65 kg/m.  Observation/Objective:   The patient is alert and oriented. She is pleasant and answers all questions appropriately. Breathing is non-labored. She is in no acute distress at this time.    Assessment/Plan: 1. Pressure injury of skin of sacral region, unspecified injury stage Start regular dose bactrim twice daily for 10 days. Keep wound clean and dry.  May apply small amount viscous lidocaine to effected area up to four times daily if needed for pain.  - sulfamethoxazole-trimethoprim (BACTRIM) 400-80 MG tablet; Take 1 tablet by mouth 2 (two) times daily.  Dispense: 20 tablet; Refill: 0 - lidocaine (XYLOCAINE) 2 % solution; Apply small amount to affected area three to four times daily as needed for pain.  Dispense: 100 mL; Refill: 0  2. Wound healing, delayed Start regular dose bactrim twice daily for 10 days. Keep wound clean and dry. May apply small amount viscous lidocaine to effected area up to four times daily if needed for pain. Consider new referral to wound care if no improvement over next few days.  - sulfamethoxazole-trimethoprim (BACTRIM) 400-80 MG tablet; Take 1 tablet by mouth 2 (two) times daily.  Dispense: 20 tablet; Refill: 0 - lidocaine (XYLOCAINE) 2 % solution; Apply small amount to affected area three to four times daily as needed for pain.  Dispense: 100 mL; Refill: 0  3. Parkinson's disease ([place]) Continue regular visits with neurology as scheduled.   4. Essential hypertension Continue bp medication as prescribed   5. Type 2 diabetes mellitus without complication, without long-term current use of insulin (HCC) Continue insulin and other diabetic medication as prescribed   General Counseling: Cadie verbalizes understanding of the findings of today's phone visit and agrees with plan of treatment. I have discussed any further diagnostic evaluation that may be needed or ordered today. We also reviewed her medications today. she has been encouraged to call the office with any questions or concerns that should arise related to todays visit.   This patient was seen by [place] with Dr Lavera Guise as a part of collaborative care agreement  Meds ordered this encounter  Medications  . sulfamethoxazole-trimethoprim (BACTRIM) 400-80 MG tablet    Sig: Take 1 tablet by mouth 2 (two) times daily.    Dispense:   20 tablet    Refill:  0    Order Specific Question:   Supervising Provider    Answer:   Lavera Guise [id number]  . lidocaine (XYLOCAINE) 2 % solution    Sig: Apply small amount to  affected area three to four times daily as needed for pain.    Dispense:  100 mL    Refill:  0    Order Specific Question:   Supervising Provider    Answer:   Lavera Guise [id number]    Time spent: 30 Minutes    Dr Lavera Guise Internal medicine

## 2019-06-15 ENCOUNTER — Telehealth: Payer: Self-pay

## 2019-06-15 DIAGNOSIS — E119 Type 2 diabetes mellitus without complications: Secondary | ICD-10-CM | POA: Diagnosis not present

## 2019-06-15 DIAGNOSIS — H26493 Other secondary cataract, bilateral: Secondary | ICD-10-CM | POA: Diagnosis not present

## 2019-06-15 DIAGNOSIS — H1045 Other chronic allergic conjunctivitis: Secondary | ICD-10-CM | POA: Diagnosis not present

## 2019-06-15 DIAGNOSIS — H04129 Dry eye syndrome of unspecified lacrimal gland: Secondary | ICD-10-CM | POA: Diagnosis not present

## 2019-06-15 NOTE — Telephone Encounter (Signed)
CONFIRMED AND SCREENED FOR 06-20-19 OV. °

## 2019-06-17 ENCOUNTER — Emergency Department: Payer: Medicare HMO

## 2019-06-17 ENCOUNTER — Other Ambulatory Visit: Payer: Self-pay

## 2019-06-17 ENCOUNTER — Emergency Department
Admission: EM | Admit: 2019-06-17 | Discharge: 2019-06-17 | Disposition: A | Discharge status: Home / Self Care | Payer: Medicare HMO | Attending: Student | Admitting: Student

## 2019-06-17 ENCOUNTER — Encounter: Payer: Self-pay | Admitting: Emergency Medicine

## 2019-06-17 DIAGNOSIS — S7001XA Contusion of right hip, initial encounter: Secondary | ICD-10-CM | POA: Insufficient documentation

## 2019-06-17 DIAGNOSIS — Y929 Unspecified place or not applicable: Secondary | ICD-10-CM | POA: Insufficient documentation

## 2019-06-17 DIAGNOSIS — S0990XA Unspecified injury of head, initial encounter: Secondary | ICD-10-CM | POA: Diagnosis not present

## 2019-06-17 DIAGNOSIS — G20 Parkinson's disease: Secondary | ICD-10-CM | POA: Diagnosis not present

## 2019-06-17 DIAGNOSIS — Y999 Unspecified external cause status: Secondary | ICD-10-CM | POA: Insufficient documentation

## 2019-06-17 DIAGNOSIS — M25551 Pain in right hip: Secondary | ICD-10-CM | POA: Diagnosis not present

## 2019-06-17 DIAGNOSIS — E119 Type 2 diabetes mellitus without complications: Secondary | ICD-10-CM | POA: Insufficient documentation

## 2019-06-17 DIAGNOSIS — W1839XA Other fall on same level, initial encounter: Secondary | ICD-10-CM | POA: Insufficient documentation

## 2019-06-17 DIAGNOSIS — I10 Essential (primary) hypertension: Secondary | ICD-10-CM | POA: Insufficient documentation

## 2019-06-17 DIAGNOSIS — Y9389 Activity, other specified: Secondary | ICD-10-CM | POA: Insufficient documentation

## 2019-06-17 DIAGNOSIS — Z794 Long term (current) use of insulin: Secondary | ICD-10-CM | POA: Insufficient documentation

## 2019-06-17 DIAGNOSIS — S79911A Unspecified injury of right hip, initial encounter: Secondary | ICD-10-CM | POA: Diagnosis not present

## 2019-06-17 DIAGNOSIS — S0101XA Laceration without foreign body of scalp, initial encounter: Secondary | ICD-10-CM | POA: Diagnosis present

## 2019-06-17 DIAGNOSIS — W19XXXA Unspecified fall, initial encounter: Secondary | ICD-10-CM

## 2019-06-17 MED ORDER — ACETAMINOPHEN 500 MG PO TABS
1000.0000 mg | ORAL_TABLET | Freq: Once | ORAL | Status: AC
  Administered 2019-06-17: 1000 mg via ORAL
  Filled 2019-06-17: qty 2

## 2019-06-17 MED ORDER — LIDOCAINE-EPINEPHRINE-TETRACAINE (LET) TOPICAL GEL
3.0000 mL | Freq: Once | TOPICAL | Status: AC
  Administered 2019-06-17: 3 mL via TOPICAL

## 2019-06-17 NOTE — ED Triage Notes (Signed)
Tripped and fell approx 1430 and hit head, lac to back of scalp noted. Patient has parkinsons and unsteady gait. No LOC.

## 2019-06-17 NOTE — ED Provider Notes (Signed)
[facility] EMERGENCY DEPARTMENT Provider Note   CSN: [id number] Arrival date & time: 06/17/19  1522     History Chief Complaint  Patient presents with   Laceration    Jacqueline [name] is a 84 y.o. female presents to the emergency department for evaluation of a fall just prior to arrival.  Patient suffers from Parkinson's, states her legs locked up and she fell backwards.  Fall was witnessed by daughter.  Patient has some pain to the right buttocks and to the posterior occipital region.  She suffered a laceration to the scalp along the occipital region.  She denies any headache at this time.  No LOC, nausea or vomiting.  No chest pain or shortness of breath.  No dizziness or lightheadedness prior to her fall.  She is on aspirin.  She denies any back pain numbness tingling or radicular symptoms.  HPI     Past Medical History:  Diagnosis Date   Asthma    Collapsed lung    Diabetes mellitus without complication ([place])    Patient takes Insulin twice a day.    GERD (gastroesophageal reflux disease)    Hyperlipidemia    Hypertension    Hypothyroidism    Parkinson's disease ([place])    Reactive airway disease     Patient Active Problem List   Diagnosis Date Noted   Pressure injury of skin of sacral region 06/12/2019   Wound healing, delayed 06/12/2019   Encounter for general adult medical examination with abnormal findings 06/21/2018   Uncontrolled type 2 diabetes mellitus with hyperglycemia ([place]) 06/21/2018   Asthma without status asthmaticus 07/08/2016   Hyperlipidemia, unspecified 07/08/2016   Dysphagia 01/07/2016   Unsteadiness 10/01/2015   Type 2 diabetes mellitus without complication, without long-term current use of insulin ([place]) 08/07/2015   Mixed Alzheimer's and vascular dementia ([place]) 08/06/2015   Controlled type 2 diabetes mellitus without complication, without long-term current use of insulin ([place]) 02/12/2015   Diabetes type 2,  controlled ([place]) 06/19/2014   Essential hypertension 03/08/2014   Parkinson's disease ([place]) 01/16/2014   Fatigue 11/09/2013   Diabetes mellitus, type II ([place]) 09/15/2013   Difficulty walking 09/15/2013   Dizziness 09/15/2013    Past Surgical History:  Procedure Laterality Date   ABDOMINAL HYSTERECTOMY     CHOLECYSTECTOMY     COLONOSCOPY WITH PROPOFOL N/A 03/29/2015   Procedure: COLONOSCOPY WITH PROPOFOL;  Surgeon: Hulen Luster, MD;  Location: [facility] ENDOSCOPY;  Service: Gastroenterology;  Laterality: N/A;   ESOPHAGOGASTRODUODENOSCOPY (EGD) WITH PROPOFOL N/A 03/29/2015   Procedure: ESOPHAGOGASTRODUODENOSCOPY (EGD) WITH PROPOFOL;  Surgeon: Hulen Luster, MD;  Location: [facility] ENDOSCOPY;  Service: Gastroenterology;  Laterality: N/A;   lung collapse     broken rib from fall punctured lung     OB History   No obstetric history on file.     Family History  Problem Relation Age of Onset   Bladder Cancer Neg Hx    Kidney cancer Neg Hx     Social History   Tobacco Use   Smoking status: Never Smoker   Smokeless tobacco: Never Used  Substance Use Topics   Alcohol use: Not Currently   Drug use: No    Home Medications Prior to Admission medications   Medication Sig Start Date End Date Taking? Authorizing Provider  azelastine (OPTIVAR) 0.05 % ophthalmic solution  08/26/17   [provider]  BD PEN NEEDLE NANO U/F 32G X 4 MM MISC USE AS DIRECTED TWICE A DAY 03/27/19   [place],  Audie Clear, NP  calcium-vitamin D (OSCAL-500) 500-400 MG-UNIT tablet Take 1 tablet by mouth 2 (two) times daily.    [provider]  carbidopa-levodopa (SINEMET IR) 25-100 MG tablet Take 0.5 tablets 5 (five) times daily by mouth.  07/07/16   [provider]  Cholecalciferol (VITAMIN D3) 2000 units capsule Take by mouth.    [provider]  donepezil (ARICEPT) 10 MG tablet Take 10 mg by mouth at bedtime.    [provider]  fluticasone (FLONASE) 50 MCG/ACT nasal  spray Place into the nose.    [provider]  glucose blood (FREESTYLE LITE) test strip Use as instructed 04/16/15   Nance Pear, MD  ibuprofen (ADVIL,MOTRIN) 400 MG tablet Take 400 mg by mouth every 6 (six) hours as needed.    [provider]  insulin aspart (NOVOLOG) 100 UNIT/ML FlexPen Sliding Scale TID with meals up to 30 units 09/03/15   [provider]  Insulin Glargine (LANTUS SOLOSTAR) 100 UNIT/ML Solostar Pen Inject 10 Units daily at 10 pm into the skin.  10/01/15 05/12/20  [provider]  ketorolac (ACULAR) 0.5 % ophthalmic solution INSTILL 1 DROP INTO RIGHT EYE Daily 12/18/15   [provider]  levothyroxine (SYNTHROID, LEVOTHROID) 50 MCG tablet Take 50 mcg by mouth daily before breakfast.    [provider]  lidocaine (XYLOCAINE) 2 % solution Apply small amount to affected area three to four times daily as needed for pain. 06/12/19   Ronnell Freshwater, NP  memantine (NAMENDA) 5 MG tablet Take 1 tablet daily by mouth. 04/13/17 04/13/18  [provider]  omeprazole (PRILOSEC) 40 MG capsule TAKE 1 CAPSULE BY MOUTH EVERY DAY 02/27/19   Kendell Bane, NP  rasagiline (AZILECT) 1 MG TABS tablet Take 1 mg by mouth daily.    [provider]  sulfamethoxazole-trimethoprim (BACTRIM) 400-80 MG tablet Take 1 tablet by mouth 2 (two) times daily. 06/12/19   Ronnell Freshwater, NP  SYMBICORT 80-4.5 MCG/ACT inhaler TAKE 2 PUFFS BY MOUTH TWICE A DAY 05/18/18   Lavera Guise, MD  vitamin E 400 UNIT capsule Take 400 Units by mouth daily.    [provider]    Allergies    Patient has no known allergies.  Review of Systems   Review of Systems  Constitutional: Negative for fever.  Eyes: Negative for visual disturbance.  Respiratory: Negative for cough and shortness of breath.   Cardiovascular: Negative for chest pain.  Gastrointestinal: Negative for nausea and vomiting.  Musculoskeletal: Positive for arthralgias. Negative  for back pain.  Skin: Positive for wound.  Neurological: Negative for dizziness, light-headedness and headaches.    Physical Exam Updated Vital Signs BP (!) 111/56    Pulse 90    Temp 98.6 F (37 C) (Oral)    Resp 18    Ht 5\' 2"  (1.575 m)    Wt 47.2 kg    SpO2 99%    BMI 19.02 kg/m   Physical Exam Constitutional:      Appearance: She is well-developed.  HENT:     Head: Normocephalic and atraumatic.     Comments: 2 cm linear laceration to the posterior scalp along the occipital region.  No large hematoma swelling.  Mild tenderness to palpation.    Mouth/Throat:     Pharynx: No oropharyngeal exudate or posterior oropharyngeal erythema.  Eyes:     Conjunctiva/sclera: Conjunctivae normal.  Cardiovascular:     Rate and Rhythm: Normal rate.  Pulmonary:  Effort: Pulmonary effort is normal. No respiratory distress.  Musculoskeletal:        General: Normal range of motion.     Cervical back: Normal range of motion.     Comments: No lumbar spinous process tenderness.  Tenderness to the right posterior hip and greater troches region.  Leg lengths are equal.  No spinous process tenderness along the cervical spine.  No clavicle or proximal humerus tenderness.  Skin:    General: Skin is warm.     Findings: No rash.  Neurological:     General: No focal deficit present.     Mental Status: She is alert and oriented to person, place, and time.     Cranial Nerves: No cranial nerve deficit.  Psychiatric:        Behavior: Behavior normal.        Thought Content: Thought content normal.     ED Results / Procedures / Treatments   Labs (all labs ordered are listed, but only abnormal results are displayed) Labs Reviewed - No data to display  EKG None  Radiology CT Head Wo Contrast  Result Date: 06/17/2019 CLINICAL DATA:  Trip and fall this afternoon with head injury. Parkinson disease. EXAM: CT HEAD WITHOUT CONTRAST TECHNIQUE: Contiguous axial images were obtained from the base of the  skull through the vertex without intravenous contrast. COMPARISON:  11/19/2014 head CT. FINDINGS: Brain: No evidence of parenchymal hemorrhage or extra-axial fluid collection. No mass lesion, mass effect, or midline shift. No CT evidence of acute infarction. Nonspecific moderate subcortical and periventricular white matter hypodensity, most in keeping with chronic small vessel ischemic change. Generalized cerebral volume loss. No ventriculomegaly. Vascular: No acute abnormality. Skull: No evidence of calvarial fracture. Sinuses/Orbits: No fluid levels. Mucoperiosteal thickening and prominent opacification of the left maxillary sinus. Asymmetric mild left maxillary sinus wall hyperostosis. Other:  The mastoid air cells are unopacified. IMPRESSION: 1. No evidence of acute intracranial abnormality. No evidence of calvarial fracture. 2. Generalized cerebral volume loss and moderate chronic small vessel ischemic changes. 3. Chronic left maxillary sinusitis. Electronically Signed   By: Ilona Sorrel M.D.   On: 06/17/2019 19:04   DG HIP UNILAT WITH PELVIS 2-3 VIEWS RIGHT  Result Date: 06/17/2019 CLINICAL DATA:  Fall today with right hip pain. EXAM: DG HIP (WITH OR WITHOUT PELVIS) 2-3V RIGHT COMPARISON:  07/29/2016 CT abdomen/pelvis FINDINGS: No pelvic fracture or diastasis. No right hip fracture or dislocation. No suspicious focal osseous lesions. No significant right hip arthropathy. IMPRESSION: No fracture.  No right hip malalignment. Electronically Signed   By: Ilona Sorrel M.D.   On: 06/17/2019 18:43    Procedures .Marland KitchenLaceration Repair  Date/Time: 06/17/2019 6:37 PM Performed by: Duanne Guess, PA-C Authorized by: Duanne Guess, PA-C   Consent:    Consent obtained:  Verbal   Consent given by:  Patient Anesthesia (see MAR for exact dosages):    Anesthesia method:  Topical application   Topical anesthetic:  LET Laceration details:    Location:  Scalp   Scalp location:  Occipital   Length (cm):  2    Depth (mm):  2 Repair type:    Repair type:  Simple Exploration:    Wound exploration: wound explored through full range of motion and entire depth of wound probed and visualized   Treatment:    Area cleansed with:  Betadine and saline Skin repair:    Repair method:  Staples   Number of staples:  4 Approximation:    Approximation:  Close Post-procedure details:    Dressing:  Bulky dressing   Patient tolerance of procedure:  Tolerated well, no immediate complications   (including critical care time)  Medications Ordered in ED Medications  lidocaine-EPINEPHrine-tetracaine (LET) topical gel (3 mLs Topical Given by Other 06/17/19 1926)  acetaminophen (TYLENOL) tablet 1,000 mg (1,000 mg Oral Given 06/17/19 1929)    ED Course  I have reviewed the triage vital signs and the nursing notes.  Pertinent labs & imaging results that were available during my care of the patient were reviewed by me and considered in my medical decision making (see chart for details).    MDM Rules/Calculators/A&P                      -year-old female with mechanical fall earlier today.  No preceding chest pain, shortness of breath, dizziness or lightheadedness.  Mild right hip pain, x-rays negative.  No leg length discrepancies.  Patient ambulatory.  She hit her head and suffered a laceration.  CT of the head negative.  Laceration cleansed and repaired with 4 staples.  Patient with very little discomfort.  No LOC, nausea or vomiting.  She appears well.  Stable and ready for discharge to home.  She is educated on wound care and signs symptoms return to ED for. Final Clinical Impression(s) / ED Diagnoses Final diagnoses:  Injury of head, initial encounter  Scalp laceration, initial encounter  Contusion of right hip, initial encounter    Rx / DC Orders ED Discharge Orders    None       Renata Caprice 06/17/19 1939    Lilia Pro., MD 06/17/19 2011

## 2019-06-17 NOTE — Discharge Instructions (Addendum)
Please wear dressing over the laceration until tomorrow morning.  Tomorrow morning patient may shower and remove dressing.  As long as there is no drainage from the laceration site no dressing will be needed.  Return to the ER for any headache, nausea, vomiting, worsening symptoms or urgent changes in patient's health.  Follow-up PCP in 1 week for staple removal.

## 2019-06-20 ENCOUNTER — Other Ambulatory Visit: Payer: Self-pay

## 2019-06-20 ENCOUNTER — Encounter: Payer: Self-pay | Admitting: Internal Medicine

## 2019-06-20 ENCOUNTER — Ambulatory Visit (INDEPENDENT_AMBULATORY_CARE_PROVIDER_SITE_OTHER): Payer: Medicare HMO | Admitting: Internal Medicine

## 2019-06-20 DIAGNOSIS — G20 Parkinson's disease: Secondary | ICD-10-CM | POA: Diagnosis not present

## 2019-06-20 DIAGNOSIS — Z794 Long term (current) use of insulin: Secondary | ICD-10-CM

## 2019-06-20 DIAGNOSIS — L89152 Pressure ulcer of sacral region, stage 2: Secondary | ICD-10-CM

## 2019-06-20 DIAGNOSIS — E782 Mixed hyperlipidemia: Secondary | ICD-10-CM | POA: Diagnosis not present

## 2019-06-20 DIAGNOSIS — Z5320 Procedure and treatment not carried out because of patient's decision for unspecified reasons: Secondary | ICD-10-CM

## 2019-06-20 DIAGNOSIS — Z0001 Encounter for general adult medical examination with abnormal findings: Secondary | ICD-10-CM

## 2019-06-20 DIAGNOSIS — Z23 Encounter for immunization: Secondary | ICD-10-CM | POA: Diagnosis not present

## 2019-06-20 DIAGNOSIS — G20A1 Parkinson's disease without dyskinesia, without mention of fluctuations: Secondary | ICD-10-CM

## 2019-06-20 DIAGNOSIS — E119 Type 2 diabetes mellitus without complications: Secondary | ICD-10-CM

## 2019-06-20 DIAGNOSIS — R300 Dysuria: Secondary | ICD-10-CM | POA: Diagnosis not present

## 2019-06-20 MED ORDER — ATORVASTATIN CALCIUM 10 MG PO TABS: ORAL_TABLET | ORAL | 2 refills | Status: DC

## 2019-06-20 NOTE — Progress Notes (Signed)
[facility] [place], [place] [address]  Internal MEDICINE  Office Visit Note  Patient Name: Jacqueline [name]  [id number]  [id number]  Date of Service: 06/20/2019  Chief Complaint  Patient presents with  . Medicare Wellness  . Hypertension  . Hyperlipidemia  . Gastroesophageal Reflux  . Diabetes  . Hypothyroidism  . Quality Metric Gaps    pna vacc  . Fall    fallen several times in the last few weeks fell saturday and had to get staples   . Sore    on left side of buttock    HPI Pt is here for routine health maintenance examination. She has here with her daughter. She has multiple medical problem 1. Parkinsonism disease followed and managed by neurology. She continues to have uncontrolled, worsening disease with tremors and fall. She will need staples to be removed in few days  2. DM is not well controlled but is labile with hypoglycemic events, managed by endocrinology on insulin 3. Recent lipid profile is abnormal as well. 4. Due to long periods of sitting she has pressure sores and are painful. She is not sure how many she has 5. Has lost more weight. 6. She lives at home  7. Blood pressure is slightly elevated  Current Medication: Outpatient Encounter Medications as of 06/20/2019  Medication Sig  . azelastine (OPTIVAR) 0.05 % ophthalmic solution   . BD PEN NEEDLE NANO U/F 32G X 4 MM MISC USE AS DIRECTED TWICE A DAY  . calcium-vitamin D (OSCAL-500) 500-400 MG-UNIT tablet Take 1 tablet by mouth 2 (two) times daily.  . carbidopa-levodopa (SINEMET IR) 25-100 MG tablet Take 0.5 tablets 5 (five) times daily by mouth.   . Cholecalciferol (VITAMIN D3) 2000 units capsule Take by mouth.  . donepezil (ARICEPT) 10 MG tablet Take 10 mg by mouth at bedtime.  . fluticasone (FLONASE) 50 MCG/ACT nasal spray Place into the nose.  Marland Kitchen glucose blood (FREESTYLE LITE) test strip Use as instructed  . ibuprofen (ADVIL,MOTRIN) 400 MG tablet Take 400 mg by mouth every 6  (six) hours as needed.  . insulin aspart (NOVOLOG) 100 UNIT/ML FlexPen Sliding Scale TID with meals up to 30 units  . Insulin Glargine (LANTUS SOLOSTAR) 100 UNIT/ML Solostar Pen Inject 10 Units daily at 10 pm into the skin.   Marland Kitchen ketorolac (ACULAR) 0.5 % ophthalmic solution INSTILL 1 DROP INTO RIGHT EYE Daily  . levothyroxine (SYNTHROID, LEVOTHROID) 50 MCG tablet Take 50 mcg by mouth daily before breakfast.  . lidocaine (XYLOCAINE) 2 % solution Apply small amount to affected area three to four times daily as needed for pain.  Marland Kitchen omeprazole (PRILOSEC) 40 MG capsule TAKE 1 CAPSULE BY MOUTH EVERY DAY  . rasagiline (AZILECT) 1 MG TABS tablet Take 1 mg by mouth daily.  Marland Kitchen sulfamethoxazole-trimethoprim (BACTRIM) 400-80 MG tablet Take 1 tablet by mouth 2 (two) times daily.  . SYMBICORT 80-4.5 MCG/ACT inhaler TAKE 2 PUFFS BY MOUTH TWICE A DAY  . vitamin E 400 UNIT capsule Take 400 Units by mouth daily.  Marland Kitchen atorvastatin (LIPITOR) 10 MG tablet Take half tab po qd for high cholesterol  . memantine (NAMENDA) 5 MG tablet Take 1 tablet daily by mouth.   No facility-administered encounter medications on file as of 06/20/2019.    Surgical History: Past Surgical History:  Procedure Laterality Date  . ABDOMINAL HYSTERECTOMY    . CHOLECYSTECTOMY    . COLONOSCOPY WITH PROPOFOL N/A 03/29/2015   Procedure: COLONOSCOPY WITH PROPOFOL;  Surgeon: Eddie Dibbles  Johnell Comings, MD;  Location: [facility] ENDOSCOPY;  Service: Gastroenterology;  Laterality: N/A;  . ESOPHAGOGASTRODUODENOSCOPY (EGD) WITH PROPOFOL N/A 03/29/2015   Procedure: ESOPHAGOGASTRODUODENOSCOPY (EGD) WITH PROPOFOL;  Surgeon: Hulen Luster, MD;  Location: [facility] ENDOSCOPY;  Service: Gastroenterology;  Laterality: N/A;  . lung collapse     broken rib from fall punctured lung    Medical History: Past Medical History:  Diagnosis Date  . Asthma   . Collapsed lung   . Diabetes mellitus without complication ([place])    Patient takes Insulin twice a day.   Marland Kitchen GERD (gastroesophageal  reflux disease)   . Hyperlipidemia   . Hypertension   . Hypothyroidism   . Parkinson's disease ([place])   . Reactive airway disease     Family History: Family History  Problem Relation Age of Onset  . Bladder Cancer Neg Hx   . Kidney cancer Neg Hx    Review of Systems  Constitutional: Negative for chills, diaphoresis and fatigue.  HENT: Negative for ear pain, postnasal drip and sinus pressure.   Eyes: Negative for visual disturbance.  Respiratory: Negative for cough, shortness of breath and wheezing.   Cardiovascular: Negative for chest pain.  Gastrointestinal: Negative for abdominal pain, constipation, diarrhea, nausea and vomiting.  Endocrine: Positive for cold intolerance.  Genitourinary: Negative for dysuria and flank pain.  Musculoskeletal: Positive for gait problem. Negative for arthralgias, back pain and neck pain.       Frequent falls   Skin: Negative for color change.  Allergic/Immunologic: Negative for environmental allergies and food allergies.  Neurological: Positive for tremors and speech difficulty. Negative for dizziness.  Hematological: Does not bruise/bleed easily.  Psychiatric/Behavioral: Negative for agitation, behavioral problems (depression) and hallucinations.     Vital Signs: BP (!) 140/92   Pulse (!) 101   Resp 16   Ht 5' 1.5" (1.562 m)   Wt 106 lb 9.6 oz (48.4 kg)   SpO2 98%   BMI 19.82 kg/m    Physical Exam Constitutional:      General: She is not in acute distress.    Appearance: She is well-developed. She is not diaphoretic.  HENT:     Head: Normocephalic and atraumatic.     Mouth/Throat:     Pharynx: No oropharyngeal exudate.  Eyes:     Pupils: Pupils are equal, round, and reactive to light.  Neck:     Thyroid: No thyromegaly.     Vascular: No JVD.     Trachea: No tracheal deviation.  Cardiovascular:     Rate and Rhythm: Normal rate and regular rhythm.     Heart sounds: Normal heart sounds. No murmur. No friction rub. No gallop.    Pulmonary:     Effort: Pulmonary effort is normal. No respiratory distress.     Breath sounds: No wheezing or rales.  Chest:     Chest wall: No tenderness.     Breasts:        Right: Normal.        Left: Normal.  Abdominal:     General: Bowel sounds are normal.     Palpations: Abdomen is soft.  Musculoskeletal:        General: Normal range of motion.     Cervical back: Normal range of motion and neck supple.  Lymphadenopathy:     Cervical: No cervical adenopathy.  Skin:    General: Skin is warm and dry.  Neurological:     Mental Status: She is alert and oriented to person, place, and time.  Cranial Nerves: No cranial nerve deficit.  Psychiatric:        Behavior: Behavior normal.        Thought Content: Thought content normal.        Judgment: Judgment normal.   LABS: Recent Results (from the past 2160 hour(s))  CBC with Differential     Status: None   Collection Time: 04/24/19  9:19 PM  Result Value Ref Range   WBC 6.4 4.0 - 10.5 K/uL   RBC 4.69 3.87 - 5.11 MIL/uL   Hemoglobin 14.0 12.0 - 15.0 g/dL   HCT 40.6 36.0 - 46.0 %   MCV 86.6 80.0 - 100.0 fL   MCH 29.9 26.0 - 34.0 pg   MCHC 34.5 30.0 - 36.0 g/dL   RDW 12.9 11.5 - 15.5 %   Platelets 184 150 - 400 K/uL   nRBC 0.0 0.0 - 0.2 %   Neutrophils Relative % 65 %   Neutro Abs 4.2 1.7 - 7.7 K/uL   Lymphocytes Relative 24 %   Lymphs Abs 1.5 0.7 - 4.0 K/uL   Monocytes Relative 9 %   Monocytes Absolute 0.6 0.1 - 1.0 K/uL   Eosinophils Relative 1 %   Eosinophils Absolute 0.1 0.0 - 0.5 K/uL   Basophils Relative 1 %   Basophils Absolute 0.0 0.0 - 0.1 K/uL   Immature Granulocytes 0 %   Abs Immature Granulocytes 0.02 0.00 - 0.07 K/uL    Comment: Performed at [facility], [address] [place], [place] [address]  Comprehensive metabolic panel     Status: Abnormal   Collection Time: 04/24/19  9:19 PM  Result Value Ref Range   Sodium 139 135 - 145 mmol/L   Potassium 3.7 3.5 - 5.1 mmol/L   Chloride 100 98  - 111 mmol/L   CO2 27 22 - 32 mmol/L   Glucose, Bld 176 (H) 70 - 99 mg/dL   BUN 24 (H) 8 - 23 mg/dL   Creatinine, Ser 1.28 (H) 0.44 - 1.00 mg/dL   Calcium 9.7 8.9 - 10.3 mg/dL   Total Protein 6.6 6.5 - 8.1 g/dL   Albumin 3.5 3.5 - 5.0 g/dL   AST 17 15 - 41 U/L   ALT <5 0 - 44 U/L   Alkaline Phosphatase 97 38 - 126 U/L   Total Bilirubin 0.8 0.3 - 1.2 mg/dL   GFR calc non Af Amer 39 (L) >60 mL/min   GFR calc Af Amer 45 (L) >60 mL/min   Anion gap 12 5 - 15    Comment: Performed at [facility], [place], [place] [address]  Troponin I (High Sensitivity)     Status: None   Collection Time: 04/24/19  9:19 PM  Result Value Ref Range   Troponin I (High Sensitivity) 9 <18 ng/L    Comment: (NOTE) Elevated high sensitivity troponin I (hsTnI) values and significant  changes across serial measurements may suggest ACS but many other  chronic and acute conditions are known to elevate hsTnI results.  Refer to the "Links" section for chest pain algorithms and additional  guidance. Performed at [facility], [place], [place] [address]   Troponin I (High Sensitivity)     Status: None   Collection Time: 04/25/19  1:08 AM  Result Value Ref Range   Troponin I (High Sensitivity) 9 <18 ng/L    Comment: (NOTE) Elevated high sensitivity troponin I (hsTnI) values and significant  changes across serial measurements may suggest  ACS but many other  chronic and acute conditions are known to elevate hsTnI results.  Refer to the "Links" section for chest pain algorithms and additional  guidance. Performed at [facility], [address] [place], [place] [address]   Blood gas, venous     Status: Abnormal   Collection Time: 04/25/19  1:08 AM  Result Value Ref Range   pH, Ven 7.35 7.250 - 7.430   pCO2, Ven 57 44.0 - 60.0 mmHg   pO2, Ven 31.0 (LL) 32.0 - 45.0 mmHg   Bicarbonate 31.5 (H) 20.0 - 28.0 mmol/L   Acid-Base Excess 4.3 (H) 0.0 - 2.0  mmol/L   O2 Saturation 55.7 %   Patient temperature 37.0    Collection site VEIN    Sample type VENOUS     Comment: Performed at [facility], [address] [place], [place] [address]  Urinalysis, Complete w Microscopic     Status: Abnormal   Collection Time: 04/25/19  3:35 AM  Result Value Ref Range   Color, Urine YELLOW (A) YELLOW   APPearance CLEAR (A) CLEAR   Specific Gravity, Urine 1.004 (L) 1.005 - 1.030   pH 6.0 5.0 - 8.0   Glucose, UA NEGATIVE NEGATIVE mg/dL   Hgb urine dipstick SMALL (A) NEGATIVE   Bilirubin Urine NEGATIVE NEGATIVE   Ketones, ur NEGATIVE NEGATIVE mg/dL   Protein, ur NEGATIVE NEGATIVE mg/dL   Nitrite NEGATIVE NEGATIVE   Leukocytes,Ua NEGATIVE NEGATIVE   RBC / HPF 0-5 0 - 5 RBC/hpf   WBC, UA 0-5 0 - 5 WBC/hpf   Bacteria, UA NONE SEEN NONE SEEN   Squamous Epithelial / LPF 0-5 0 - 5    Comment: Performed at [facility], [address] [place], [place] [address]  SARS CORONAVIRUS 2 (TAT 6-24 HRS) Nasopharyngeal Nasopharyngeal Swab     Status: None   Collection Time: 04/25/19  4:00 AM   Specimen: Nasopharyngeal Swab  Result Value Ref Range   SARS Coronavirus 2 NEGATIVE NEGATIVE    Comment: (NOTE) SARS-CoV-2 target nucleic acids are NOT DETECTED. The SARS-CoV-2 RNA is generally detectable in upper and lower respiratory specimens during the acute phase of infection. Negative results do not preclude SARS-CoV-2 infection, do not rule out co-infections with other pathogens, and should not be used as the sole basis for treatment or other patient management decisions. Negative results must be combined with clinical observations, patient history, and epidemiological information. The expected result is Negative. Fact Sheet for Patients: SugarRoll.be Fact Sheet for Healthcare Providers: https://www.woods-mathews.com/ This test is not yet approved or cleared by the [place] FDA and  has been  authorized for detection and/or diagnosis of SARS-CoV-2 by FDA under an Emergency Use Authorization (EUA). This EUA will remain  in effect (meaning this test can be used) for the duration of the COVID-19 declaration under Section 56 4(b)(1) of the Act, 21 U.S.C. section 360bbb-3(b)(1), unless the authorization is terminated or revoked sooner. Performed at [facility], [place] [address], [place], [place] [address]    Assessment/Plan: 1. Encounter for general adult medical examination with abnormal findings - Continue with PHM   2. Type 2 diabetes mellitus without complication, with long-term current use of insulin (HCC) - Followed up by endocrinology.  - AMB referral to wound care center  3. Parkinson's disease ([place]) - Worsening disease, multiple falls, pt will need staples to be removed   4. Pressure injury of sacral region, stage 2 (HCC) - Tender sores with punched  out lesions ( about 2 in number with surrounding erythema and tenderness ) - AMB referral to wound care center  5. Mixed hyperlipidemia -Start Lipitor 10 mg ( half tab po qd)   6. Screening mammography declined - Pt declined mammogram   7. Dysuria - UA/M w/rflx Culture, Routine  8. Flu vaccine need - Flu Vaccine  General Counseling: Kriya verbalizes understanding of the findings of todays visit and agrees with plan of treatment. I have discussed any further diagnostic evaluation that may be needed or ordered today. We also reviewed her medications today. she has been encouraged to call the office with any questions or concerns that should arise related to todays visit.   Orders Placed This Encounter  Procedures  . Flu Vaccine  . UA/M w/rflx Culture, Routine  . AMB referral to wound care center    Meds ordered this encounter  Medications  . atorvastatin (LIPITOR) 10 MG tablet    Sig: Take half tab po qd for high cholesterol    Dispense:  45 tablet    Refill:  2    Total time spent: 30  Minutes  Time spent includes review of chart, medications, test results, and follow up plan with the patient.   Lavera Guise, MD  Internal Medicine

## 2019-06-21 LAB — UA/M W/RFLX CULTURE, ROUTINE
Bilirubin, UA: NEGATIVE
Glucose, UA: NEGATIVE
Ketones, UA: NEGATIVE
Leukocytes,UA: NEGATIVE
Nitrite, UA: NEGATIVE
Protein,UA: NEGATIVE
RBC, UA: NEGATIVE
Specific Gravity, UA: 1.021 (ref 1.005–1.030)
Urobilinogen, Ur: 0.2 mg/dL (ref 0.2–1.0)
pH, UA: 5.5 (ref 5.0–7.5)

## 2019-06-21 LAB — MICROSCOPIC EXAMINATION

## 2019-06-22 ENCOUNTER — Telehealth: Payer: Self-pay

## 2019-06-22 NOTE — Telephone Encounter (Signed)
Confirmed appointment with patients daughter. klh

## 2019-06-26 ENCOUNTER — Ambulatory Visit (INDEPENDENT_AMBULATORY_CARE_PROVIDER_SITE_OTHER): Payer: Medicare HMO | Admitting: Adult Health

## 2019-06-26 ENCOUNTER — Other Ambulatory Visit: Payer: Self-pay

## 2019-06-26 ENCOUNTER — Encounter: Payer: Self-pay | Admitting: Adult Health

## 2019-06-26 ENCOUNTER — Encounter: Payer: Medicare HMO | Attending: Physician Assistant | Admitting: Physician Assistant

## 2019-06-26 VITALS — BP 148/94 | HR 84 | Temp 97.3°F | Resp 16 | Ht 61.5 in | Wt 105.4 lb

## 2019-06-26 DIAGNOSIS — B351 Tinea unguium: Secondary | ICD-10-CM | POA: Diagnosis not present

## 2019-06-26 DIAGNOSIS — E1142 Type 2 diabetes mellitus with diabetic polyneuropathy: Secondary | ICD-10-CM | POA: Diagnosis not present

## 2019-06-26 DIAGNOSIS — G20 Parkinson's disease: Secondary | ICD-10-CM | POA: Insufficient documentation

## 2019-06-26 DIAGNOSIS — L89323 Pressure ulcer of left buttock, stage 3: Secondary | ICD-10-CM | POA: Insufficient documentation

## 2019-06-26 DIAGNOSIS — Z4802 Encounter for removal of sutures: Secondary | ICD-10-CM | POA: Diagnosis not present

## 2019-06-26 DIAGNOSIS — E11622 Type 2 diabetes mellitus with other skin ulcer: Secondary | ICD-10-CM | POA: Insufficient documentation

## 2019-06-26 DIAGNOSIS — Z794 Long term (current) use of insulin: Secondary | ICD-10-CM | POA: Insufficient documentation

## 2019-06-26 DIAGNOSIS — E1151 Type 2 diabetes mellitus with diabetic peripheral angiopathy without gangrene: Secondary | ICD-10-CM | POA: Insufficient documentation

## 2019-06-26 DIAGNOSIS — E782 Mixed hyperlipidemia: Secondary | ICD-10-CM

## 2019-06-26 DIAGNOSIS — L89152 Pressure ulcer of sacral region, stage 2: Secondary | ICD-10-CM | POA: Diagnosis not present

## 2019-06-26 DIAGNOSIS — I10 Essential (primary) hypertension: Secondary | ICD-10-CM | POA: Insufficient documentation

## 2019-06-26 DIAGNOSIS — L851 Acquired keratosis [keratoderma] palmaris et plantaris: Secondary | ICD-10-CM | POA: Diagnosis not present

## 2019-06-26 MED ORDER — PNEUMOCOCCAL 13-VAL CONJ VACC IM SUSP: 0.5000 mL | INTRAMUSCULAR | 0 refills | Status: AC

## 2019-06-26 NOTE — Progress Notes (Signed)
[facility] [place], [place] [address]  Internal MEDICINE  Office Visit Note  Patient Name: Jacqueline [name]  [id number]  [id number]  Date of Service: 06/26/2019  Chief Complaint  Patient presents with  . Follow-up    staple removal, went to wound care today  . Medication Management    atorvastatin was given for high cholesterol by dfk and wanted to make sure that it is okay for her to take along with her other meds and her dx of parkinsons    HPI  Pt is here for staple removal.  She fell and lacerated her posterior scalp 10 days ago. She was seen in the ER and 4 staples were placed. She has had no issues with this wound.  It is well approximated, and appears to be healing.  No drainage noted.  There are no signs/symptoms of infection.    Discussed atorvastatin, and safety with current medications.      Current Medication: Outpatient Encounter Medications as of 06/26/2019  Medication Sig  . atorvastatin (LIPITOR) 10 MG tablet Take half tab po qd for high cholesterol  . azelastine (OPTIVAR) 0.05 % ophthalmic solution   . BD PEN NEEDLE NANO U/F 32G X 4 MM MISC USE AS DIRECTED TWICE A DAY  . calcium-vitamin D (OSCAL-500) 500-400 MG-UNIT tablet Take 1 tablet by mouth 2 (two) times daily.  . carbidopa-levodopa (SINEMET IR) 25-100 MG tablet Take 0.5 tablets 5 (five) times daily by mouth.   . Cholecalciferol (VITAMIN D3) 2000 units capsule Take by mouth.  . donepezil (ARICEPT) 10 MG tablet Take 10 mg by mouth at bedtime.  . fluticasone (FLONASE) 50 MCG/ACT nasal spray Place into the nose.  Marland Kitchen glucose blood (FREESTYLE LITE) test strip Use as instructed  . ibuprofen (ADVIL,MOTRIN) 400 MG tablet Take 400 mg by mouth every 6 (six) hours as needed.  . insulin aspart (NOVOLOG) 100 UNIT/ML FlexPen Sliding Scale TID with meals up to 30 units  . Insulin Glargine (LANTUS SOLOSTAR) 100 UNIT/ML Solostar Pen Inject 10 Units daily at 10 pm into the skin.   Marland Kitchen ketorolac (ACULAR)  0.5 % ophthalmic solution INSTILL 1 DROP INTO RIGHT EYE Daily  . levothyroxine (SYNTHROID, LEVOTHROID) 50 MCG tablet Take 50 mcg by mouth daily before breakfast.  . lidocaine (XYLOCAINE) 2 % solution Apply small amount to affected area three to four times daily as needed for pain.  Marland Kitchen omeprazole (PRILOSEC) 40 MG capsule TAKE 1 CAPSULE BY MOUTH EVERY DAY  . rasagiline (AZILECT) 1 MG TABS tablet Take 1 mg by mouth daily.  Marland Kitchen sulfamethoxazole-trimethoprim (BACTRIM) 400-80 MG tablet Take 1 tablet by mouth 2 (two) times daily.  . SYMBICORT 80-4.5 MCG/ACT inhaler TAKE 2 PUFFS BY MOUTH TWICE A DAY  . vitamin E 400 UNIT capsule Take 400 Units by mouth daily.  . memantine (NAMENDA) 5 MG tablet Take 1 tablet daily by mouth.   No facility-administered encounter medications on file as of 06/26/2019.    Surgical History: Past Surgical History:  Procedure Laterality Date  . ABDOMINAL HYSTERECTOMY    . CHOLECYSTECTOMY    . COLONOSCOPY WITH PROPOFOL N/A 03/29/2015   Procedure: COLONOSCOPY WITH PROPOFOL;  Surgeon: Hulen Luster, MD;  Location: [facility] ENDOSCOPY;  Service: Gastroenterology;  Laterality: N/A;  . ESOPHAGOGASTRODUODENOSCOPY (EGD) WITH PROPOFOL N/A 03/29/2015   Procedure: ESOPHAGOGASTRODUODENOSCOPY (EGD) WITH PROPOFOL;  Surgeon: Hulen Luster, MD;  Location: [facility] ENDOSCOPY;  Service: Gastroenterology;  Laterality: N/A;  . lung collapse     broken  rib from fall punctured lung    Medical History: Past Medical History:  Diagnosis Date  . Asthma   . Collapsed lung   . Diabetes mellitus without complication ([place])    Patient takes Insulin twice a day.   Marland Kitchen GERD (gastroesophageal reflux disease)   . Hyperlipidemia   . Hypertension   . Hypothyroidism   . Parkinson's disease ([place])   . Reactive airway disease     Family History: Family History  Problem Relation Age of Onset  . Bladder Cancer Neg Hx   . Kidney cancer Neg Hx     Social History   Socioeconomic History  . Marital status: Widowed     Spouse name: Not on file  . Number of children: Not on file  . Years of education: Not on file  . Highest education level: Not on file  Occupational History  . Not on file  Tobacco Use  . Smoking status: Never Smoker  . Smokeless tobacco: Never Used  Substance and Sexual Activity  . Alcohol use: Not Currently  . Drug use: No  . Sexual activity: Not on file  Other Topics Concern  . Not on file  Social History Narrative  . Not on file   Social Determinants of Health   Financial Resource Strain:   . Difficulty of Paying Living Expenses: Not on file  Food Insecurity:   . Worried About Charity fundraiser in the Last Year: Not on file  . Ran Out of Food in the Last Year: Not on file  Transportation Needs:   . Lack of Transportation (Medical): Not on file  . Lack of Transportation (Non-Medical): Not on file  Physical Activity:   . Days of Exercise per Week: Not on file  . Minutes of Exercise per Session: Not on file  Stress:   . Feeling of Stress : Not on file  Social Connections:   . Frequency of Communication with Friends and Family: Not on file  . Frequency of Social Gatherings with Friends and Family: Not on file  . Attends Religious Services: Not on file  . Active Member of Clubs or Organizations: Not on file  . Attends Archivist Meetings: Not on file  . Marital Status: Not on file  Intimate Partner Violence:   . Fear of Current or Ex-Partner: Not on file  . Emotionally Abused: Not on file  . Physically Abused: Not on file  . Sexually Abused: Not on file      Review of Systems  Constitutional: Negative for chills, fatigue and unexpected weight change.  HENT: Negative for congestion, rhinorrhea, sneezing and sore throat.   Eyes: Negative for photophobia, pain and redness.  Respiratory: Negative for cough, chest tightness and shortness of breath.   Cardiovascular: Negative for chest pain and palpitations.  Gastrointestinal: Negative for abdominal pain,  constipation, diarrhea, nausea and vomiting.  Endocrine: Negative.   Genitourinary: Negative for dysuria and frequency.  Musculoskeletal: Negative for arthralgias, back pain, joint swelling and neck pain.  Skin: Negative for rash.  Allergic/Immunologic: Negative.   Neurological: Negative for tremors and numbness.  Hematological: Negative for adenopathy. Does not bruise/bleed easily.  Psychiatric/Behavioral: Negative for behavioral problems and sleep disturbance. The patient is not nervous/anxious.     Vital Signs: BP (!) 148/94   Pulse 84   Temp (!) 97.3 F (36.3 C)   Resp 16   Ht 5' 1.5" (1.562 m)   Wt 105 lb 6.4 oz (47.8 kg)   SpO2 96%  BMI 19.59 kg/m    Physical Exam Vitals and nursing note reviewed.  Constitutional:      General: She is not in acute distress.    Appearance: She is well-developed. She is not diaphoretic.  HENT:     Head: Normocephalic and atraumatic.     Mouth/Throat:     Pharynx: No oropharyngeal exudate.  Eyes:     Pupils: Pupils are equal, round, and reactive to light.  Neck:     Thyroid: No thyromegaly.     Vascular: No JVD.     Trachea: No tracheal deviation.  Cardiovascular:     Rate and Rhythm: Normal rate and regular rhythm.     Heart sounds: Normal heart sounds. No murmur. No friction rub. No gallop.   Pulmonary:     Effort: Pulmonary effort is normal. No respiratory distress.     Breath sounds: Normal breath sounds. No wheezing or rales.  Chest:     Chest wall: No tenderness.  Abdominal:     Palpations: Abdomen is soft.     Tenderness: There is no abdominal tenderness. There is no guarding.  Musculoskeletal:        General: Normal range of motion.     Cervical back: Normal range of motion and neck supple.  Lymphadenopathy:     Cervical: No cervical adenopathy.  Skin:    General: Skin is warm and dry.  Neurological:     Mental Status: She is alert and oriented to person, place, and time.     Cranial Nerves: No cranial nerve  deficit.  Psychiatric:        Behavior: Behavior normal.        Thought Content: Thought content normal.        Judgment: Judgment normal.    Assessment/Plan: 1. Encounter for removal of staples 4 stables removed from posterior scalp. Clean and intact.    2. Pressure injury of sacral region, stage 2 ([place]) Saw wound care today, continue to monitor.  3. Mixed hyperlipidemia Discussed safety of atorvastatin at this time. Encouraged continued compliance.  General Counseling: Yael verbalizes understanding of the findings of todays visit and agrees with plan of treatment. I have discussed any further diagnostic evaluation that may be needed or ordered today. We also reviewed her medications today. she has been encouraged to call the office with any questions or concerns that should arise related to todays visit.    No orders of the defined types were placed in this encounter.   No orders of the defined types were placed in this encounter.   Time spent: 25 Minutes   This patient was seen by Orson Gear AGNP-C in Collaboration with Dr Lavera Guise as a part of collaborative care agreement     Kendell Bane AGNP-C Internal medicine

## 2019-06-26 NOTE — Progress Notes (Signed)
[name], [name] ([id number]) Visit Report for 06/26/2019 Allergy List Details Patient Name: Jacqueline [name], Jacqueline [name] Date of Service: 06/26/2019 9:45 AM Medical Record Number: [id number] Patient Account Number: [id number] Date of Birth/Sex: 03/04/1936 (84 y.o. F) Treating RN: Montey Hora Primary Care Logyn Dedominicis: Clayborn Bigness Other Clinician: Referring Kirk Basquez: Clayborn Bigness Treating Joshua Zeringue/Extender: STONE III, HOYT Weeks in Treatment: 0 Allergies Active Allergies No Known Drug Allergies Allergy Notes Electronic Signature(s) Signed: 06/26/2019 4:42:47 PM By: Montey Hora Entered By: Montey Hora on 06/26/2019 09:57:42 Jacqueline [name] ([id number]) -------------------------------------------------------------------------------- Arrival Information Details Patient Name: Jacqueline [name] Date of Service: 06/26/2019 9:45 AM Medical Record Number: [id number] Patient Account Number: [id number] Date of Birth/Sex: May 23, 1936 (84 y.o. F) Treating RN: Cornell Barman Primary Care Sakai Heinle: Clayborn Bigness Other Clinician: Referring Brinlyn Cena: Clayborn Bigness Treating Ofelia Podolski/Extender: Melburn Hake, HOYT Weeks in Treatment: 0 Visit Information Patient Arrived: Cane Arrival Time: 09:45 Accompanied By: caregiver Transfer Assistance: None Patient Identification Verified: Yes Secondary Verification Process Completed: Yes Electronic Signature(s) Signed: 06/26/2019 11:15:10 AM By: Lorine Bears RCP, RRT, CHT Entered By: Lorine Bears on 06/26/2019 09:51:53 Jacqueline [name] ([id number]) -------------------------------------------------------------------------------- Clinic Level of Care Assessment Details Patient Name: Jacqueline [name] Date of Service: 06/26/2019 9:45 AM Medical Record Number: [id number] Patient Account Number: [id number] Date of Birth/Sex: [birth year] (83 y.o. F) Treating RN: Cornell Barman Primary Care Gotham Raden: Clayborn Bigness Other Clinician: Referring Torin Whisner: Clayborn Bigness Treating Domanick Cuccia/Extender: Melburn Hake, HOYT Weeks in Treatment: 0 Clinic Level of Care Assessment Items TOOL 1 Quantity Score []  - Use when EandM and Procedure is performed on INITIAL visit 0 ASSESSMENTS - Nursing Assessment / Reassessment X - General Physical Exam (combine w/ comprehensive assessment (listed just below) when 1 20 performed on new pt. evals) X- 1 25 Comprehensive Assessment (HX, ROS, Risk Assessments, Wounds Hx, etc.) ASSESSMENTS - Wound and Skin Assessment / Reassessment []  - Dermatologic / Skin Assessment (not related to wound area) 0 ASSESSMENTS - Ostomy and/or Continence Assessment and Care []  - Incontinence Assessment and Management 0 []  - 0 Ostomy Care Assessment and Management (repouching, etc.) PROCESS - Coordination of Care X - Simple Patient / Family Education for ongoing care 1 15 []  - 0 Complex (extensive) Patient / Family Education for ongoing care []  - 0 Staff obtains Programmer, systems, Records, Test Results / Process Orders []  - 0 Staff telephones HHA, Nursing Homes / Clarify orders / etc []  - 0 Routine Transfer to another Facility (non-emergent condition) []  - 0 Routine Hospital Admission (non-emergent condition) X- 1 15 New Admissions / Biomedical engineer / Ordering NPWT, Apligraf, etc. []  - 0 Emergency Hospital Admission (emergent condition) PROCESS - Special Needs []  - Pediatric / Minor Patient Management 0 []  - 0 Isolation Patient Management []  - 0 Hearing / Language / Visual special needs []  - 0 Assessment of Community assistance (transportation, D/C planning, etc.) []  - 0 Additional assistance / Altered mentation []  - 0 Support Surface(s) Assessment (bed, cushion, seat, etc.) Jacqueline [name], Jacqueline [name] ([id number]) INTERVENTIONS - Miscellaneous []  - External ear exam 0 []  - 0 Patient Transfer (multiple staff / Civil Service fast streamer / Similar devices) []  - 0 Simple Staple / Suture removal (25 or less) []  - 0 Complex Staple / Suture removal (26  or more) []  - 0 Hypo/Hyperglycemic Management (do not check if billed separately) []  - 0 Ankle / Brachial Index (ABI) - do not check if billed separately Has the patient been seen at the hospital within the last three years: Yes Total Score: 75 Level Of  Care: New/Established - Level 2 Electronic Signature(s) Signed: 06/26/2019 5:04:31 PM By: Gretta Cool, BSN, RN, CWS, Kim RN, BSN Entered By: Gretta Cool, BSN, RN, CWS, Kim on 06/26/2019 10:33:00 Jacqueline [name] ([id number]) -------------------------------------------------------------------------------- Encounter Discharge Information Details Patient Name: Jacqueline [name] Date of Service: 06/26/2019 9:45 AM Medical Record Number: [id number] Patient Account Number: [id number] Date of Birth/Sex: Aug 12, 1935 (83 y.o. F) Treating RN: Cornell Barman Primary Care Audray Rumore: Clayborn Bigness Other Clinician: Referring Norvell Ureste: Clayborn Bigness Treating Jameca Chumley/Extender: Melburn Hake, HOYT Weeks in Treatment: 0 Encounter Discharge Information Items Post Procedure Vitals Discharge Condition: Stable Temperature (F): 97.5 Ambulatory Status: Ambulatory Pulse (bpm): 87 Discharge Destination: Home Respiratory Rate (breaths/min): 16 Transportation: Private Auto Blood Pressure (mmHg): 137/60 Accompanied By: caregiver Schedule Follow-up Appointment: Yes Clinical Summary of Care: Electronic Signature(s) Signed: 06/26/2019 5:04:31 PM By: Gretta Cool, BSN, RN, CWS, Kim RN, BSN Entered By: Gretta Cool, BSN, RN, CWS, Kim on 06/26/2019 10:37:38 Jacqueline [name] ([id number]) -------------------------------------------------------------------------------- Lower Extremity Assessment Details Patient Name: Jacqueline [name] Date of Service: 06/26/2019 9:45 AM Medical Record Number: [id number] Patient Account Number: [id number] Date of Birth/Sex: 03/24/1936 (83 y.o. F) Treating RN: Montey Hora Primary Care Kordelia Severin: Clayborn Bigness Other Clinician: Referring Tavaris Eudy: Clayborn Bigness Treating  Charnita Trudel/Extender: Melburn Hake, HOYT Weeks in Treatment: 0 Electronic Signature(s) Signed: 06/26/2019 4:42:47 PM By: Montey Hora Entered By: Montey Hora on 06/26/2019 09:57:52 Jacqueline [name] ([id number]) -------------------------------------------------------------------------------- Multi Wound Chart Details Patient Name: Jacqueline [name] Date of Service: 06/26/2019 9:45 AM Medical Record Number: [id number] Patient Account Number: [id number] Date of Birth/Sex: [birth year] (84 y.o. F) Treating RN: Cornell Barman Primary Care Ilian Wessell: Clayborn Bigness Other Clinician: Referring Juleen Sorrels: Clayborn Bigness Treating Arlind Klingerman/Extender: Melburn Hake, HOYT Weeks in Treatment: 0 Vital Signs Height(in): 62 Pulse(bpm): 74 Weight(lbs): 104 Blood Pressure(mmHg): 137/60 Body Mass Index(BMI): 19 Temperature(F): 97.5 Respiratory Rate 16 (breaths/min): Photos: [N/A:N/A] Wound Location: Left Gluteus - Proximal Left Gluteus - Distal N/A Wounding Event: Pressure Injury Pressure Injury N/A Primary Etiology: Pressure Ulcer Pressure Ulcer N/A Comorbid History: Hypertension, Type II Hypertension, Type II N/A Diabetes, Osteoarthritis Diabetes, Osteoarthritis Date Acquired: 05/26/2019 05/26/2019 N/A Weeks of Treatment: 0 0 N/A Wound Status: Open Open N/A Measurements L x W x D 0.4x0.6x0.2 1.4x1.2x0.2 N/A (cm) Area (cm) : 0.188 1.319 N/A Volume (cm) : 0.038 0.264 N/A Classification: Category/Stage III Category/Stage III N/A Exudate Amount: Medium Medium N/A Exudate Type: Serous Serous N/A Exudate Color: amber amber N/A Wound Margin: Flat and Intact Flat and Intact N/A Granulation Amount: Medium (34-66%) Medium (34-66%) N/A Granulation Quality: Pink Pink N/A Necrotic Amount: Medium (34-66%) Medium (34-66%) N/A Exposed Structures: Fat Layer (Subcutaneous Fat Layer (Subcutaneous N/A Tissue) Exposed: Yes Tissue) Exposed: Yes Fascia: No Fascia: No Tendon: No Tendon: No Muscle: No Muscle: No Joint:  No Joint: No Bone: No Bone: No Epithelialization: None None N/A Jacqueline [name], Jacqueline [name] ([id number]) Treatment Notes Electronic Signature(s) Signed: 06/26/2019 5:04:31 PM By: Gretta Cool, BSN, RN, CWS, Kim RN, BSN Entered By: Gretta Cool, BSN, RN, CWS, Kim on 06/26/2019 10:27:48 Jacqueline [name] ([id number]) -------------------------------------------------------------------------------- [place] Details Patient Name: Jacqueline [name] Date of Service: 06/26/2019 9:45 AM Medical Record Number: [id number] Patient Account Number: [id number] Date of Birth/Sex: [birth year] (83 y.o. F) Treating RN: Cornell Barman Primary Care Princessa Lesmeister: Clayborn Bigness Other Clinician: Referring Emiel Kielty: Clayborn Bigness Treating Sharmain Lastra/Extender: Melburn Hake, HOYT Weeks in Treatment: 0 Active Inactive Necrotic Tissue Nursing Diagnoses: Impaired tissue integrity related to necrotic/devitalized tissue Goals: Necrotic/devitalized tissue will be minimized in the wound bed Date Initiated: 06/26/2019 Target Resolution Date: 08/07/2019 Goal Status:  Active Patient/caregiver will verbalize understanding of reason and process for debridement of necrotic tissue Date Initiated: 06/26/2019 Target Resolution Date: 08/07/2019 Goal Status: Active Interventions: Assess patient pain level pre-, during and post procedure and prior to discharge Provide education on necrotic tissue and debridement process Treatment Activities: Apply topical anesthetic as ordered : 06/26/2019 Excisional debridement : 06/26/2019 Notes: Nutrition Nursing Diagnoses: Imbalanced nutrition Goals: Patient/caregiver verbalizes understanding of need to maintain therapeutic glucose control per primary care physician Date Initiated: 06/26/2019 Target Resolution Date: 07/10/2019 Goal Status: Active Interventions: Provide education on elevated blood sugars and impact on wound healing Treatment Activities: Patient referred to dietician for evaluation and possible  medical nutrition therapy : 06/26/2019 Notes: Orientation to the Wound Care Program [place]. ([id number]) Nursing Diagnoses: Knowledge deficit related to the wound healing center program Goals: Patient/caregiver will verbalize understanding of the [facility] Program Date Initiated: 06/26/2019 Target Resolution Date: 07/10/2019 Goal Status: Active Interventions: Provide education on orientation to the wound center Notes: Pain, Acute or Chronic Nursing Diagnoses: Pain Management - Non-cyclic Acute (Procedural) Goals: Patient will verbalize adequate pain control and receive pain control interventions during procedures as needed Date Initiated: 06/26/2019 Target Resolution Date: 07/10/2019 Goal Status: Active Interventions: Reposition patient for comfort Treatment Activities: Administer pain control measures as ordered : 06/26/2019 Notes: Pressure Nursing Diagnoses: Knowledge deficit related to management of pressures ulcers Potential for impaired tissue integrity related to pressure, friction, moisture, and shear Goals: Patient will remain free from development of additional pressure ulcers Date Initiated: 06/26/2019 Target Resolution Date: 07/10/2019 Goal Status: Active Interventions: Assess: immobility, friction, shearing, incontinence upon admission and as needed Notes: Wound/Skin Impairment Nursing Diagnoses: Impaired tissue integrity Goals: Ulcer/skin breakdown will have a volume reduction of 30% by week 4 Jacqueline [name], Jacqueline [name] ([id number]) Date Initiated: 06/26/2019 Target Resolution Date: 07/27/2019 Goal Status: Active Interventions: Assess patient/caregiver ability to obtain necessary supplies Assess patient/caregiver ability to perform ulcer/skin care regimen upon admission and as needed Treatment Activities: Referred to DME Maddilynn Esperanza for dressing supplies : 06/26/2019 Skin care regimen initiated : 06/26/2019 Notes: Electronic Signature(s) Signed: 06/26/2019  5:04:31 PM By: Gretta Cool, BSN, RN, CWS, Kim RN, BSN Entered By: Gretta Cool, BSN, RN, CWS, Kim on 06/26/2019 10:27:22 Jacqueline [name] ([id number]) -------------------------------------------------------------------------------- Pain Assessment Details Patient Name: Jacqueline [name] Date of Service: 06/26/2019 9:45 AM Medical Record Number: [id number] Patient Account Number: [id number] Date of Birth/Sex: [birth year] (84 y.o. F) Treating RN: Cornell Barman Primary Care Yeriel Mineo: Clayborn Bigness Other Clinician: Referring Christeena Krogh: Clayborn Bigness Treating Mery Guadalupe/Extender: Melburn Hake, HOYT Weeks in Treatment: 0 Active Problems Location of Pain Severity and Description of Pain Patient Has Paino Yes Site Locations Rate the pain. Current Pain Level: 8 Pain Management and Medication Current Pain Management: Electronic Signature(s) Signed: 06/26/2019 11:15:10 AM By: Lorine Bears RCP, RRT, CHT Signed: 06/26/2019 5:04:31 PM By: Gretta Cool, BSN, RN, CWS, Kim RN, BSN Entered By: Lorine Bears on 06/26/2019 09:52:24 Jacqueline [name] ([id number]) -------------------------------------------------------------------------------- Patient/Caregiver Education Details Patient Name: Jacqueline [name] Date of Service: 06/26/2019 9:45 AM Medical Record Number: [id number] Patient Account Number: [id number] Date of Birth/Gender: 02-16-1936 (84 y.o. F) Treating RN: Cornell Barman Primary Care Physician: Clayborn Bigness Other Clinician: Referring Physician: Clayborn Bigness Treating Physician/Extender: Sharalyn Ink in Treatment: 0 Education Assessment Education Provided To: Patient Education Topics Provided Wound/Skin Impairment: Handouts: Caring for Your Ulcer Methods: Demonstration, Explain/Verbal Responses: State content correctly Electronic Signature(s) Signed: 06/26/2019 5:04:31 PM By: Gretta Cool, BSN, RN, CWS, Kim RN, BSN Entered By: Gretta Cool, BSN, RN,  CWS, Kim on 06/26/2019 10:33:22 Jacqueline [name], Jacqueline [name]  ([id number]) -------------------------------------------------------------------------------- Wound Assessment Details Patient Name: Jacqueline [name], Jacqueline [name] Date of Service: 06/26/2019 9:45 AM Medical Record Number: [id number] Patient Account Number: [id number] Date of Birth/Sex: [birth year] (84 y.o. F) Treating RN: Montey Hora Primary Care Edis Huish: Clayborn Bigness Other Clinician: Referring Farheen Pfahler: Clayborn Bigness Treating Trelyn Vanderlinde/Extender: Melburn Hake, HOYT Weeks in Treatment: 0 Wound Status Wound Number: 1 Primary Etiology: Pressure Ulcer Wound Location: Left Gluteus - Proximal Wound Status: Open Wounding Event: Pressure Injury Comorbid Hypertension, Type II Diabetes, History: Osteoarthritis Date Acquired: 05/26/2019 Weeks Of Treatment: 0 Clustered Wound: No Photos Wound Measurements Length: (cm) 0.4 % Reduction in Width: (cm) 0.6 % Reduction in Depth: (cm) 0.2 Epithelializat Area: (cm) 0.188 Tunneling: Volume: (cm) 0.038 Undermining: Area: 0% Volume: 0% ion: None No No Wound Description Classification: Category/Stage III Foul Odor Afte Wound Margin: Flat and Intact Slough/Fibrino Exudate Amount: Medium Exudate Type: Serous Exudate Color: amber r Cleansing: No Yes Wound Bed Granulation Amount: Medium (34-66%) Exposed Structure Granulation Quality: Pink Fascia Exposed: No Necrotic Amount: Medium (34-66%) Fat Layer (Subcutaneous Tissue) Exposed: Yes Necrotic Quality: Adherent Slough Tendon Exposed: No Muscle Exposed: No Joint Exposed: No Bone Exposed: No Assessment Notes full thickness Jacqueline [name], Jacqueline [name] ([id number]) Treatment Notes Wound #1 (Left, Proximal Gluteus) Notes Prisma Ag, Bordered Foam Dressing Electronic Signature(s) Signed: 06/26/2019 4:42:47 PM By: Montey Hora Signed: 06/26/2019 5:04:31 PM By: Gretta Cool, BSN, RN, CWS, Kim RN, BSN Entered By: Gretta Cool, BSN, RN, CWS, Kim on 06/26/2019 10:32:21 Jacqueline [name]  ([id number]) -------------------------------------------------------------------------------- Wound Assessment Details Patient Name: Jacqueline [name] Date of Service: 06/26/2019 9:45 AM Medical Record Number: [id number] Patient Account Number: [id number] Date of Birth/Sex: [birth year] (84 y.o. F) Treating RN: Montey Hora Primary Care Adit Riddles: Clayborn Bigness Other Clinician: Referring Suzette Flagler: Clayborn Bigness Treating Lyndol Vanderheiden/Extender: Melburn Hake, HOYT Weeks in Treatment: 0 Wound Status Wound Number: 2 Primary Etiology: Pressure Ulcer Wound Location: Left Gluteus - Distal Wound Status: Open Wounding Event: Pressure Injury Comorbid Hypertension, Type II Diabetes, History: Osteoarthritis Date Acquired: 05/26/2019 Weeks Of Treatment: 0 Clustered Wound: No Photos Wound Measurements Length: (cm) 1.4 % Reduction Width: (cm) 1.2 % Reduction Depth: (cm) 0.2 Epithelializ Area: (cm) 1.319 Tunneling: Volume: (cm) 0.264 Undermining in Area: 0% in Volume: 0% ation: None No : No Wound Description Classification: Category/Stage III Foul Odor Af Wound Margin: Flat and Intact Slough/Fibri Exudate Amount: Medium Exudate Type: Serous Exudate Color: amber ter Cleansing: No no Yes Wound Bed Granulation Amount: Medium (34-66%) Exposed Structure Granulation Quality: Pink Fascia Exposed: No Necrotic Amount: Medium (34-66%) Fat Layer (Subcutaneous Tissue) Exposed: Yes Necrotic Quality: Adherent Slough Tendon Exposed: No Muscle Exposed: No Joint Exposed: No Bone Exposed: No Assessment Notes full thickness Jacqueline [name], Jacqueline [name] ([id number]) Treatment Notes Wound #2 (Left, Distal Gluteus) Notes Prisma Ag, Bordered Foam Dressing Electronic Signature(s) Signed: 06/26/2019 4:42:47 PM By: Montey Hora Signed: 06/26/2019 5:04:31 PM By: Gretta Cool, BSN, RN, CWS, Kim RN, BSN Entered By: Gretta Cool, BSN, RN, CWS, Kim on 06/26/2019 10:32:36 Jacqueline [name]  ([id number]) -------------------------------------------------------------------------------- Vitals Details Patient Name: Jacqueline [name] Date of Service: 06/26/2019 9:45 AM Medical Record Number: [id number] Patient Account Number: [id number] Date of Birth/Sex: 1936-02-16 (84 y.o. F) Treating RN: Cornell Barman Primary Care Symeon Puleo: Clayborn Bigness Other Clinician: Referring Erricka Falkner: Clayborn Bigness Treating Franke Menter/Extender: Melburn Hake, HOYT Weeks in Treatment: 0 Vital Signs Time Taken: 09:52 Temperature (F): 97.5 Height (in): 62 Pulse (bpm): 87 Source: Stated Respiratory Rate (breaths/min): 16 Weight (lbs): 104 Blood Pressure (mmHg): 137/60 Source: Measured Reference Range:  80 - 120 mg / dl Body Mass Index (BMI): 19 Electronic Signature(s) Signed: 06/26/2019 11:15:10 AM By: Lorine Bears RCP, RRT, CHT Entered By: Lorine Bears on 06/26/2019 09:54:51

## 2019-06-26 NOTE — Progress Notes (Signed)
KENT, STULLER ([id number]) Visit Report for 06/26/2019 Abuse/Suicide Risk Screen Details Patient Name: Jacqueline [name], Jacqueline [name] Date of Service: 06/26/2019 9:45 AM Medical Record Number: [id number] Patient Account Number: [id number] Date of Birth/Sex: [birth year] (84 y.o. F) Treating RN: Montey Hora Primary Care Karsen Nakanishi: Clayborn Bigness Other Clinician: Referring July Linam: Clayborn Bigness Treating Caylor Cerino/Extender: Melburn Hake, HOYT Weeks in Treatment: 0 Abuse/Suicide Risk Screen Items Answer ABUSE RISK SCREEN: Has anyone close to you tried to hurt or harm you recentlyo No Do you feel uncomfortable with anyone in your familyo No Has anyone forced you do things that you didnot want to doo No Electronic Signature(s) Signed: 06/26/2019 4:42:47 PM By: Montey Hora Entered By: Montey Hora on 06/26/2019 10:01:50 Jacqueline [name] ([id number]) -------------------------------------------------------------------------------- Activities of Daily Living Details Patient Name: Jacqueline [name] Date of Service: 06/26/2019 9:45 AM Medical Record Number: [id number] Patient Account Number: [id number] Date of Birth/Sex: 1935/10/24 (83 y.o. F) Treating RN: Montey Hora Primary Care Shannyn Jankowiak: Clayborn Bigness Other Clinician: Referring Kaydince Towles: Clayborn Bigness Treating Vasiliy Mccarry/Extender: Melburn Hake, HOYT Weeks in Treatment: 0 Activities of Daily Living Items Answer Activities of Daily Living (Please select one for each item) Drive Automobile Not Able Take Medications Need Assistance Use Telephone Completely Able Care for Appearance Completely Able Use Toilet Completely Able Bath / Shower Need Assistance Dress Self Completely Able Feed Self Completely Able Walk Need Assistance Get In / Out Bed Completely Mio Need Assistance Shop for Self Need Assistance Electronic Signature(s) Signed: 06/26/2019 4:42:47 PM By: Montey Hora Entered By:  Montey Hora on 06/26/2019 10:02:31 Jacqueline [name] ([id number]) -------------------------------------------------------------------------------- Education Screening Details Patient Name: Jacqueline [name] Date of Service: 06/26/2019 9:45 AM Medical Record Number: [id number] Patient Account Number: [id number] Date of Birth/Sex: 1936-06-07 (83 y.o. F) Treating RN: Montey Hora Primary Care Babygirl Trager: Clayborn Bigness Other Clinician: Referring Daja Shuping: Clayborn Bigness Treating Hadas Jessop/Extender: Melburn Hake, HOYT Weeks in Treatment: 0 Primary Learner Assessed: Patient Learning Preferences/Education Level/Primary Language Learning Preference: Explanation, Demonstration Highest Education Level: College or Above Preferred Language: English Cognitive Barrier Language Barrier: No Translator Needed: No Memory Deficit: No Emotional Barrier: No Cultural/Religious Beliefs Affecting Medical Care: No Physical Barrier Impaired Vision: No Impaired Hearing: No Decreased Hand dexterity: No Knowledge/Comprehension Knowledge Level: Medium Comprehension Level: Medium Ability to understand written Medium instructions: Ability to understand verbal Medium instructions: Motivation Anxiety Level: Calm Cooperation: Cooperative Education Importance: Acknowledges Need Interest in Health Problems: Asks Questions Perception: Coherent Willingness to Engage in Self- Medium Management Activities: Readiness to Engage in Self- Medium Management Activities: Electronic Signature(s) Signed: 06/26/2019 4:42:47 PM By: Montey Hora Entered By: Montey Hora on 06/26/2019 10:03:01 Jacqueline [name] ([id number]) -------------------------------------------------------------------------------- Fall Risk Assessment Details Patient Name: Jacqueline [name] Date of Service: 06/26/2019 9:45 AM Medical Record Number: [id number] Patient Account Number: [id number] Date of Birth/Sex: 27-Oct-1935 (84 y.o. F) Treating RN:  Montey Hora Primary Care Derry Kassel: Clayborn Bigness Other Clinician: Referring Jamyah Folk: Clayborn Bigness Treating Oaklie Durrett/Extender: Melburn Hake, HOYT Weeks in Treatment: 0 Fall Risk Assessment Items Have you had 2 or more falls in the last 12 monthso 0 Yes Have you had any fall that resulted in injury in the last 12 monthso 0 Yes FALLS RISK SCREEN History of falling - immediate or within 3 months 25 Yes Secondary diagnosis (Do you have 2 or more medical diagnoseso) 0 No Ambulatory aid None/bed rest/wheelchair/nurse 0 No Crutches/cane/walker 15 Yes Furniture 0 No Intravenous therapy Access/Saline/Heparin Lock 0 No Gait/Transferring Normal/ bed rest/ wheelchair 0 No  Weak (short steps with or without shuffle, stooped but able to lift head while 10 Yes walking, may seek support from furniture) Impaired (short steps with shuffle, may have difficulty arising from chair, head 0 No down, impaired balance) Mental Status Oriented to own ability 0 Yes Electronic Signature(s) Signed: 06/26/2019 4:42:47 PM By: Montey Hora Entered By: Montey Hora on 06/26/2019 10:03:31 Jacqueline [name] ([id number]) -------------------------------------------------------------------------------- Foot Assessment Details Patient Name: Jacqueline [name] Date of Service: 06/26/2019 9:45 AM Medical Record Number: [id number] Patient Account Number: [id number] Date of Birth/Sex: February 21, 1936 (84 y.o. F) Treating RN: Montey Hora Primary Care Talissa Apple: Clayborn Bigness Other Clinician: Referring Alveta Quintela: Clayborn Bigness Treating Naiomy Watters/Extender: Melburn Hake, HOYT Weeks in Treatment: 0 Foot Assessment Items Site Locations + = Sensation present, - = Sensation absent, C = Callus, U = Ulcer R = Redness, W = Warmth, M = Maceration, PU = Pre-ulcerative lesion F = Fissure, S = Swelling, D = Dryness Assessment Right: Left: Other Deformity: No No Prior Foot Ulcer: No No Prior Amputation: No No Charcot Joint: No No Ambulatory  Status: Ambulatory With Help Assistance Device: Cane Gait: Steady Electronic Signature(s) Signed: 06/26/2019 4:42:47 PM By: Montey Hora Entered By: Montey Hora on 06/26/2019 10:03:48 Jacqueline [name] ([id number]) -------------------------------------------------------------------------------- Nutrition Risk Screening Details Patient Name: Jacqueline [name] Date of Service: 06/26/2019 9:45 AM Medical Record Number: [id number] Patient Account Number: [id number] Date of Birth/Sex: 06-Aug-1935 (84 y.o. F) Treating RN: Montey Hora Primary Care Marquiz Sotelo: Clayborn Bigness Other Clinician: Referring Arelly Whittenberg: Clayborn Bigness Treating Elya Tarquinio/Extender: Melburn Hake, HOYT Weeks in Treatment: 0 Height (in): 62 Weight (lbs): 104 Body Mass Index (BMI): 19 Nutrition Risk Screening Items Score Screening NUTRITION RISK SCREEN: I have an illness or condition that made me change the kind and/or amount of 0 No food I eat I eat fewer than two meals per day 0 No I eat few fruits and vegetables, or milk products 0 No I have three or more drinks of beer, liquor or wine almost every day 0 No I have tooth or mouth problems that make it hard for me to eat 0 No I don't always have enough money to buy the food I need 0 No I eat alone most of the time 0 No I take three or more different prescribed or over-the-counter drugs a day 1 Yes Without wanting to, I have lost or gained 10 pounds in the last six months 0 No I am not always physically able to shop, cook and/or feed myself 0 No Nutrition Protocols Good Risk Protocol 0 No interventions needed Moderate Risk Protocol High Risk Proctocol Risk Level: Good Risk Score: 1 Electronic Signature(s) Signed: 06/26/2019 4:42:47 PM By: Montey Hora Entered By: Montey Hora on 06/26/2019 10:03:38

## 2019-06-26 NOTE — Progress Notes (Signed)
Jacqueline [name], Jacqueline [name] ([id number]) Visit Report for 06/26/2019 Chief Complaint Document Details Patient Name: Jacqueline [name] Date of Service: 06/26/2019 9:45 AM Medical Record Number: [id number] Patient Account Number: [id number] Date of Birth/Sex: 09-28-1935 (84 y.o. F) Treating RN: Cornell Barman Primary Care Provider: Clayborn Bigness Other Clinician: Referring Provider: Clayborn Bigness Treating Provider/Extender: Melburn Hake, Kaylea Mounsey Weeks in Treatment: 0 Information Obtained from: Patient Chief Complaint Left buttock pressure ulcer Electronic Signature(s) Signed: 06/26/2019 10:21:07 AM By: Worthy Keeler PA-C Entered By: Worthy Keeler on 06/26/2019 10:21:07 Jacqueline [name] ([id number]) -------------------------------------------------------------------------------- Debridement Details Patient Name: Jacqueline [name] Date of Service: 06/26/2019 9:45 AM Medical Record Number: [id number] Patient Account Number: [id number] Date of Birth/Sex: 02-Sep-1935 (83 y.o. F) Treating RN: Cornell Barman Primary Care Provider: Clayborn Bigness Other Clinician: Referring Provider: Clayborn Bigness Treating Provider/Extender: Melburn Hake, Sabine Tenenbaum Weeks in Treatment: 0 Debridement Performed for Wound #1 Left,Proximal Gluteus Assessment: Performed By: Physician STONE III, Dagmawi Venable E., PA-C Debridement Type: Debridement Level of Consciousness (Pre- Awake and Alert procedure): Pre-procedure Verification/Time Yes - 10:22 Out Taken: Start Time: 10:22 Pain Control: Lidocaine Total Area Debrided (L x W): 0.4 (cm) x 0.6 (cm) = 0.24 (cm) Tissue and other material Viable, Non-Viable, Slough, Subcutaneous, Slough debrided: Level: Skin/Subcutaneous Tissue Debridement Description: Excisional Instrument: Curette Bleeding: Minimum Hemostasis Achieved: Pressure End Time: 10:25 Response to Treatment: Procedure was tolerated well Level of Consciousness Awake and Alert (Post-procedure): Post Debridement Measurements of Total Wound Length:  (cm) 0.4 Stage: Category/Stage III Width: (cm) 0.6 Depth: (cm) 0.2 Volume: (cm) 0.038 Character of Wound/Ulcer Post Stable Debridement: Post Procedure Diagnosis Same as Pre-procedure Electronic Signature(s) Signed: 06/26/2019 4:47:43 PM By: Worthy Keeler PA-C Signed: 06/26/2019 5:04:31 PM By: Gretta Cool, BSN, RN, CWS, Kim RN, BSN Entered By: Gretta Cool, BSN, RN, CWS, Kim on 06/26/2019 10:28:40 Jacqueline [name] ([id number]) -------------------------------------------------------------------------------- Debridement Details Patient Name: Jacqueline [name] Date of Service: 06/26/2019 9:45 AM Medical Record Number: [id number] Patient Account Number: [id number] Date of Birth/Sex: [birth year] (84 y.o. F) Treating RN: Cornell Barman Primary Care Provider: Clayborn Bigness Other Clinician: Referring Provider: Clayborn Bigness Treating Provider/Extender: Melburn Hake, Natayla Cadenhead Weeks in Treatment: 0 Debridement Performed for Wound #2 Left,Distal Gluteus Assessment: Performed By: Physician STONE III, Faizon Capozzi E., PA-C Debridement Type: Debridement Level of Consciousness (Pre- Awake and Alert procedure): Pre-procedure Verification/Time Yes - 10:22 Out Taken: Start Time: 10:22 Pain Control: Lidocaine Total Area Debrided (L x W): 1.4 (cm) x 1.2 (cm) = 1.68 (cm) Tissue and other material Viable, Non-Viable, Slough, Subcutaneous, Slough debrided: Level: Skin/Subcutaneous Tissue Debridement Description: Excisional Instrument: Curette Bleeding: Minimum Hemostasis Achieved: Pressure End Time: 10:25 Response to Treatment: Procedure was tolerated well Level of Consciousness Awake and Alert (Post-procedure): Post Debridement Measurements of Total Wound Length: (cm) 1.4 Stage: Category/Stage III Width: (cm) 1.3 Depth: (cm) 0.3 Volume: (cm) 0.429 Character of Wound/Ulcer Post Stable Debridement: Post Procedure Diagnosis Same as Pre-procedure Electronic Signature(s) Signed: 06/26/2019 4:47:43 PM By: Worthy Keeler PA-C Signed: 06/26/2019 5:04:31 PM By: Gretta Cool, BSN, RN, CWS, Kim RN, BSN Entered By: Gretta Cool, BSN, RN, CWS, Kim on 06/26/2019 10:29:18 Jacqueline [name] ([id number]) -------------------------------------------------------------------------------- HPI Details Patient Name: Jacqueline [name] Date of Service: 06/26/2019 9:45 AM Medical Record Number: [id number] Patient Account Number: [id number] Date of Birth/Sex: 1936-02-26 (84 y.o. F) Treating RN: Cornell Barman Primary Care Provider: Clayborn Bigness Other Clinician: Referring Provider: Clayborn Bigness Treating Provider/Extender: Melburn Hake, Starr Engel Weeks in Treatment: 0 History of Present Illness HPI Description: 06/26/2019 on evaluation today patient appears to be doing decently  well with regard to her left gluteal wounds upon initial inspection today here in the clinic. Fortunately there is no evidence of active infection at this point. She has been attempting to manage this for a little over a month on her own with the help of her caregiver she has been using Neosporin on the area. Unfortunately there really has not been any significant improvement during that time. She does have Parkinson's disease and does have a lot of spasming which causes her to have jerking movements and her caregiver believes the way she sits that she is probably rubbing and causing issues in this area as well which is likely what contributed to the formation of the wounds. She is previously had when they were able to heal without having to come into the clinic. Unfortunately this is just not doing nearly as well. The patient does have a history of diabetes mellitus type 1 type II with a hemoglobin A1c of 7.7 on last check which was 04/26/2019.She also has a history of hypertension as well as Parkinson's disease as mentioned above and urinary incontinence. Electronic Signature(s) Signed: 06/26/2019 10:33:31 AM By: Worthy Keeler PA-C Entered By: Worthy Keeler on 06/26/2019  10:33:31 Jacqueline [name] ([id number]) -------------------------------------------------------------------------------- Physical Exam Details Patient Name: Jacqueline [name] Date of Service: 06/26/2019 9:45 AM Medical Record Number: [id number] Patient Account Number: [id number] Date of Birth/Sex: May 01, 1936 (83 y.o. F) Treating RN: Cornell Barman Primary Care Provider: Clayborn Bigness Other Clinician: Referring Provider: Clayborn Bigness Treating Provider/Extender: Melburn Hake, Jahiem Franzoni Weeks in Treatment: 0 Constitutional supine blood pressure is within target range for patient.. pulse regular and within target range for patient.Marland Kitchen respirations regular, non-labored and within target range for patient.Marland Kitchen temperature within target range for patient.. Well-nourished and well-hydrated in no acute distress. Eyes conjunctiva clear no eyelid edema noted. pupils equal round and reactive to light and accommodation. Ears, Nose, Mouth, and Throat no gross abnormality of ear auricles or external auditory canals. normal hearing noted during conversation. mucus membranes moist. Respiratory normal breathing without difficulty. clear to auscultation bilaterally. Cardiovascular regular rate and rhythm with normal S1, S2. no clubbing, cyanosis, significant edema, <3 sec cap refill. Gastrointestinal (GI) soft, non-tender, non-distended, +BS. no ventral hernia noted. Musculoskeletal Patient unable to walk without assistance. no significant deformity or arthritic changes, no loss or range of motion, no clubbing. Psychiatric this patient is able to make decisions and demonstrates good insight into disease process. Alert and Oriented x 3. pleasant and cooperative. Notes On inspection today patient's wounds actually appear to be superficial stage III pressure ulcers. Fortunately there is no sign of active infection at this time systemically which is good news. There was some slough and biofilm on the surface of the wounds  which did require sharp debridement today and that was performed without complication post debridement the wound bed appears to be doing much better which is excellent news. Overall I am very pleased in this regard. The patient does have Parkinson's disease which makes her have very spastic motions and movements pretty much constantly. This is likely contributing to the pressure ulcers and what we are seeing at this point. Electronic Signature(s) Signed: 06/26/2019 10:34:28 AM By: Worthy Keeler PA-C Entered By: Worthy Keeler on 06/26/2019 10:34:28 Jacqueline [name] ([id number]) -------------------------------------------------------------------------------- Physician Orders Details Patient Name: Jacqueline [name] Date of Service: 06/26/2019 9:45 AM Medical Record Number: [id number] Patient Account Number: [id number] Date of Birth/Sex: 04-26-1936 (83 y.o. F) Treating RN: Cornell Barman Primary Care Provider: Humphrey Rolls,  Clifton.Rei Other Clinician: Referring Provider: Clayborn Bigness Treating Provider/Extender: Melburn Hake, Marlina Cataldi Weeks in Treatment: 0 Verbal / Phone Orders: No Diagnosis Coding ICD-10 Coding [name] Description L89.323 Pressure ulcer of left buttock, stage 3 E11.622 Type 2 diabetes mellitus with other skin ulcer G20 Parkinson's disease I10 Essential (primary) hypertension N39.498 Other specified urinary incontinence Wound Cleansing Wound #1 Left,Proximal Gluteus o Clean wound with Normal Saline. o Dial antibacterial soap, wash wounds, rinse and pat dry prior to dressing wounds Wound #2 Left,Distal Gluteus o Clean wound with Normal Saline. o Dial antibacterial soap, wash wounds, rinse and pat dry prior to dressing wounds Anesthetic (add to Medication List) Wound #1 Left,Proximal Gluteus o Topical Lidocaine 4% cream applied to wound bed prior to debridement (In Clinic Only). Wound #2 Left,Distal Gluteus o Topical Lidocaine 4% cream applied to wound bed prior to debridement (In  Clinic Only). Primary Wound Dressing Wound #1 Left,Proximal Gluteus o Silver Collagen - moistened with saline Wound #2 Left,Distal Gluteus o Silver Collagen - moistened with saline Secondary Dressing Wound #1 Left,Proximal Gluteus o Boardered Foam Dressing Wound #2 Left,Distal Gluteus o Boardered Foam Dressing Dressing Change Frequency Wound #1 Left,Proximal Gluteus o Change dressing every other day. Jacqueline [name], Jacqueline [name] [place]. ([id number]) Wound #2 Left,Distal Gluteus o Change dressing every other day. Follow-up Appointments Wound #1 Left,Proximal Gluteus o Return Appointment in 1 week. Wound #2 Left,Distal Gluteus o Return Appointment in 1 week. Off-Loading Wound #1 Left,Proximal Gluteus o Turn and reposition every 2 hours o Other: - Keep pressure off of area. Wound #2 Left,Distal Gluteus o Turn and reposition every 2 hours o Other: - Keep pressure off of area. Additional Orders / Instructions Wound #1 Left,Proximal Gluteus o Increase protein intake. Wound #2 Left,Distal Gluteus o Increase protein intake. Electronic Signature(s) Signed: 06/26/2019 4:47:43 PM By: Worthy Keeler PA-C Signed: 06/26/2019 5:04:31 PM By: Gretta Cool, BSN, RN, CWS, Kim RN, BSN Entered By: Gretta Cool, BSN, RN, CWS, Kim on 06/26/2019 10:31:31 Jacqueline [name], Jacqueline [name] ([id number]) -------------------------------------------------------------------------------- Problem List Details Patient Name: Jacqueline [name] Date of Service: 06/26/2019 9:45 AM Medical Record Number: [id number] Patient Account Number: [id number] Date of Birth/Sex: 1935/11/11 (83 y.o. F) Treating RN: Cornell Barman Primary Care Provider: Clayborn Bigness Other Clinician: Referring Provider: Clayborn Bigness Treating Provider/Extender: Melburn Hake, Ayaan Shutes Weeks in Treatment: 0 Active Problems ICD-10 Evaluated Encounter [name] Description Active Date Today Diagnosis L89.323 Pressure ulcer of left buttock, stage 3 06/26/2019 No Yes E11.622 Type 2  diabetes mellitus with other skin ulcer 06/26/2019 No Yes G20 Parkinson's disease 06/26/2019 No Yes I10 Essential (primary) hypertension 06/26/2019 No Yes N39.498 Other specified urinary incontinence 06/26/2019 No Yes Inactive Problems Resolved Problems Electronic Signature(s) Signed: 06/26/2019 10:20:52 AM By: Worthy Keeler PA-C Entered By: Worthy Keeler on 06/26/2019 10:20:52 Jacqueline [name] ([id number]) -------------------------------------------------------------------------------- Progress Note Details Patient Name: Jacqueline [name] Date of Service: 06/26/2019 9:45 AM Medical Record Number: [id number] Patient Account Number: [id number] Date of Birth/Sex: 30-Aug-1935 (83 y.o. F) Treating RN: Cornell Barman Primary Care Provider: Clayborn Bigness Other Clinician: Referring Provider: Clayborn Bigness Treating Provider/Extender: Melburn Hake, Tamecka Milham Weeks in Treatment: 0 Subjective Chief Complaint Information obtained from Patient Left buttock pressure ulcer History of Present Illness (HPI) 06/26/2019 on evaluation today patient appears to be doing decently well with regard to her left gluteal wounds upon initial inspection today here in the clinic. Fortunately there is no evidence of active infection at this point. She has been attempting to manage this for a little over a month on her own with the  help of her caregiver she has been using Neosporin on the area. Unfortunately there really has not been any significant improvement during that time. She does have Parkinson's disease and does have a lot of spasming which causes her to have jerking movements and her caregiver believes the way she sits that she is probably rubbing and causing issues in this area as well which is likely what contributed to the formation of the wounds. She is previously had when they were able to heal without having to come into the clinic. Unfortunately this is just not doing nearly as well. The patient does have a history of  diabetes mellitus type 1 type II with a hemoglobin A1c of 7.7 on last check which was 04/26/2019.She also has a history of hypertension as well as Parkinson's disease as mentioned above and urinary incontinence. Patient History Information obtained from Patient. Allergies No Known Drug Allergies Family History Cancer - Mother, Stroke - Siblings, No family history of Diabetes, Heart Disease, Hereditary Spherocytosis, Hypertension, Kidney Disease, Lung Disease, Seizures, Thyroid Problems, Tuberculosis. Social History Never smoker, Marital Status - Widowed, Alcohol Use - Never, Drug Use - No History, Caffeine Use - Daily. Medical History Cardiovascular Patient has history of Hypertension Denies history of Angina, Arrhythmia, Congestive Heart Failure, Coronary Artery Disease, Deep Vein Thrombosis, Hypotension, Myocardial Infarction, Peripheral Arterial Disease, Peripheral Venous Disease, Phlebitis, Vasculitis Endocrine Patient has history of Type II Diabetes Denies history of Type I Diabetes Integumentary (Skin) Denies history of History of Burn, History of pressure wounds Musculoskeletal Patient has history of Osteoarthritis Denies history of Gout, Rheumatoid Arthritis, Osteomyelitis Neurologic Jacqueline [name], Jacqueline [name]. ([id number]) Denies history of Dementia, Neuropathy, Quadriplegia, Paraplegia, Seizure Disorder Patient is treated with Insulin. Medical And Surgical History Notes Neurologic Parkinson's since 2012 Review of Systems (ROS) Constitutional Symptoms (General Health) Denies complaints or symptoms of Fatigue, Fever, Chills, Marked Weight Change. Eyes Denies complaints or symptoms of Dry Eyes, Vision Changes, Glasses / Contacts. Ear/Nose/Mouth/Throat Denies complaints or symptoms of Difficult clearing ears, Sinusitis. Hematologic/Lymphatic Denies complaints or symptoms of Bleeding / Clotting Disorders, Human Immunodeficiency Virus. Respiratory Denies complaints or symptoms of  Chronic or frequent coughs, Shortness of Breath. Cardiovascular Denies complaints or symptoms of Chest pain, LE edema. Gastrointestinal Denies complaints or symptoms of Frequent diarrhea, Nausea, Vomiting. Endocrine Denies complaints or symptoms of Hepatitis, Thyroid disease, Polydypsia (Excessive Thirst). Genitourinary Complains or has symptoms of Incontinence/dribbling. Denies complaints or symptoms of Kidney failure/ Dialysis. Immunological Denies complaints or symptoms of Hives, Itching. Integumentary (Skin) Complains or has symptoms of Wounds. Denies complaints or symptoms of Bleeding or bruising tendency, Breakdown, Swelling. Musculoskeletal Denies complaints or symptoms of Muscle Pain, Muscle Weakness. Neurologic Denies complaints or symptoms of Numbness/parasthesias, Focal/Weakness. Psychiatric Denies complaints or symptoms of Anxiety, Claustrophobia. Objective Constitutional supine blood pressure is within target range for patient.. pulse regular and within target range for patient.Marland Kitchen respirations regular, non-labored and within target range for patient.Marland Kitchen temperature within target range for patient.. Well-nourished and well-hydrated in no acute distress. Vitals Time Taken: 9:52 AM, Height: 62 in, Source: Stated, Weight: 104 lbs, Source: Measured, BMI: 19, Temperature: 97.5  F, Pulse: 87 bpm, Respiratory Rate: 16 breaths/min, Blood Pressure: 137/60 mmHg. Jacqueline [name], Jacqueline [name] [place]. ([id number]) Eyes conjunctiva clear no eyelid edema noted. pupils equal round and reactive to light and accommodation. Ears, Nose, Mouth, and Throat no gross abnormality of ear auricles or external auditory canals. normal hearing noted during conversation. mucus membranes moist. Respiratory normal breathing without difficulty. clear to auscultation bilaterally. Cardiovascular regular rate and  rhythm with normal S1, S2. no clubbing, cyanosis, significant edema, Gastrointestinal (GI) soft, non-tender,  non-distended, +BS. no ventral hernia noted. Musculoskeletal Patient unable to walk without assistance. no significant deformity or arthritic changes, no loss or range of motion, no clubbing. Psychiatric this patient is able to make decisions and demonstrates good insight into disease process. Alert and Oriented x 3. pleasant and cooperative. General Notes: On inspection today patient's wounds actually appear to be superficial stage III pressure ulcers. Fortunately there is no sign of active infection at this time systemically which is good news. There was some slough and biofilm on the surface of the wounds which did require sharp debridement today and that was performed without complication post debridement the wound bed appears to be doing much better which is excellent news. Overall I am very pleased in this regard. The patient does have Parkinson's disease which makes her have very spastic motions and movements pretty much constantly. This is likely contributing to the pressure ulcers and what we are seeing at this point. Integumentary (Hair, Skin) Wound #1 status is Open. Original cause of wound was Pressure Injury. The wound is located on the Left,Proximal Gluteus. The wound measures 0.4cm length x 0.6cm width x 0.2cm depth; 0.188cm^2 area and 0.038cm^3 volume. There is Fat Layer (Subcutaneous Tissue) Exposed exposed. There is no tunneling or undermining noted. There is a medium amount of serous drainage noted. The wound margin is flat and intact. There is medium (34-66%) pink granulation within the wound bed. There is a medium (34-66%) amount of necrotic tissue within the wound bed including Adherent Slough. General Notes: full thickness Wound #2 status is Open. Original cause of wound was Pressure Injury. The wound is located on the Left,Distal Gluteus. The wound measures 1.4cm length x 1.2cm width x 0.2cm depth; 1.319cm^2 area and 0.264cm^3 volume. There is Fat Layer (Subcutaneous  Tissue) Exposed exposed. There is no tunneling or undermining noted. There is a medium amount of serous drainage noted. The wound margin is flat and intact. There is medium (34-66%) pink granulation within the wound bed. There is a medium (34-66%) amount of necrotic tissue within the wound bed including Adherent Slough. General Notes: full thickness Assessment Active Problems ICD-10 Pressure ulcer of left buttock, stage 3 Type 2 diabetes mellitus with other skin ulcer Parkinson's disease Essential (primary) hypertension Other specified urinary incontinence Jacqueline [name], Jacqueline [name]. ([id number]) Procedures Wound #1 Pre-procedure diagnosis of Wound #1 is a Pressure Ulcer located on the Left,Proximal Gluteus . There was a Excisional Skin/Subcutaneous Tissue Debridement with a total area of 0.24 sq cm performed by STONE III, Sheyna Pettibone E., PA-C. With the following instrument(s): Curette to remove Viable and Non-Viable tissue/material. Material removed includes Subcutaneous Tissue and Slough and after achieving pain control using Lidocaine. No specimens were taken. A time out was conducted at 10:22, prior to the start of the procedure. A Minimum amount of bleeding was controlled with Pressure. The procedure was tolerated well. Post Debridement Measurements: 0.4cm length x 0.6cm width x 0.2cm depth; 0.038cm^3 volume. Post debridement Stage noted as Category/Stage III. Character of Wound/Ulcer Post Debridement is stable. Post procedure Diagnosis Wound #1: Same as Pre-Procedure Wound #2 Pre-procedure diagnosis of Wound #2 is a Pressure Ulcer located on the Left,Distal Gluteus . There was a Excisional Skin/Subcutaneous Tissue Debridement with a total area of 1.68 sq cm performed by STONE III, Reylene Stauder E., PA-C. With the following instrument(s): Curette to remove Viable and Non-Viable tissue/material. Material removed includes Subcutaneous Tissue and Slough and after achieving  pain control using Lidocaine. No  specimens were taken. A time out was conducted at 10:22, prior to the start of the procedure. A Minimum amount of bleeding was controlled with Pressure. The procedure was tolerated well. Post Debridement Measurements: 1.4cm length x 1.3cm width x 0.3cm depth; 0.429cm^3 volume. Post debridement Stage noted as Category/Stage III. Character of Wound/Ulcer Post Debridement is stable. Post procedure Diagnosis Wound #2: Same as Pre-Procedure Plan Wound Cleansing: Wound #1 Left,Proximal Gluteus: Clean wound with Normal Saline. Dial antibacterial soap, wash wounds, rinse and pat dry prior to dressing wounds Wound #2 Left,Distal Gluteus: Clean wound with Normal Saline. Dial antibacterial soap, wash wounds, rinse and pat dry prior to dressing wounds Anesthetic (add to Medication List): Wound #1 Left,Proximal Gluteus: Topical Lidocaine 4% cream applied to wound bed prior to debridement (In Clinic Only). Wound #2 Left,Distal Gluteus: Topical Lidocaine 4% cream applied to wound bed prior to debridement (In Clinic Only). Primary Wound Dressing: Wound #1 Left,Proximal Gluteus: Silver Collagen - moistened with saline Wound #2 Left,Distal Gluteus: Silver Collagen - moistened with saline Secondary Dressing: Wound #1 Left,Proximal Gluteus: Boardered Foam Dressing Jacqueline [name], Jacqueline [name]. ([id number]) Wound #2 Left,Distal Gluteus: Boardered Foam Dressing Dressing Change Frequency: Wound #1 Left,Proximal Gluteus: Change dressing every other day. Wound #2 Left,Distal Gluteus: Change dressing every other day. Follow-up Appointments: Wound #1 Left,Proximal Gluteus: Return Appointment in 1 week. Wound #2 Left,Distal Gluteus: Return Appointment in 1 week. Off-Loading: Wound #1 Left,Proximal Gluteus: Turn and reposition every 2 hours Other: - Keep pressure off of area. Wound #2 Left,Distal Gluteus: Turn and reposition every 2 hours Other: - Keep pressure off of area. Additional Orders /  Instructions: Wound #1 Left,Proximal Gluteus: Increase protein intake. Wound #2 Left,Distal Gluteus: Increase protein intake. 1. My suggestion at this time is can be that we go ahead and initiate treatment with a silver collagen dressing I think that is good to be ideal for the patient. 2. I am also can recommend that we go ahead and initiate a bordered foam dressing to cover the area in order to help with pressure relief I think that can be beneficial as well for her. 3. I am going to suggest as well that she monitor for excessive pressure to the gluteal region and this may even mean watching out for friction considering the spasm type motions that she has secondary to the Parkinson's disease. For this reason she may need to spend some time even on her couch on her side in order to prevent continued an additional irritation to this area We will see patient back for reevaluation in 1 week here in the clinic. If anything worsens or changes patient will contact our office for additional recommendations. Electronic Signature(s) Signed: 06/26/2019 10:35:32 AM By: Worthy Keeler PA-C Entered By: Worthy Keeler on 06/26/2019 10:35:32 Jacqueline [name] ([id number]) -------------------------------------------------------------------------------- ROS/PFSH Details Patient Name: Jacqueline [name] Date of Service: 06/26/2019 9:45 AM Medical Record Number: [id number] Patient Account Number: [id number] Date of Birth/Sex: 12/16/1935 (83 y.o. F) Treating RN: Montey Hora Primary Care Provider: Clayborn Bigness Other Clinician: Referring Provider: Clayborn Bigness Treating Provider/Extender: Melburn Hake, Infant Zink Weeks in Treatment: 0 Information Obtained From Patient Constitutional Symptoms (General Health) Complaints and Symptoms: Negative for: Fatigue; Fever; Chills; Marked Weight Change Eyes Complaints and Symptoms: Negative for: Dry Eyes; Vision Changes; Glasses / Contacts Ear/Nose/Mouth/Throat Complaints  and Symptoms: Negative for: Difficult clearing ears; Sinusitis Hematologic/Lymphatic Complaints and Symptoms: Negative for: Bleeding / Clotting Disorders; Human Immunodeficiency Virus Respiratory Complaints and Symptoms: Negative for:  Chronic or frequent coughs; Shortness of Breath Cardiovascular Complaints and Symptoms: Negative for: Chest pain; LE edema Medical History: Positive for: Hypertension Negative for: Angina; Arrhythmia; Congestive Heart Failure; Coronary Artery Disease; Deep Vein Thrombosis; Hypotension; Myocardial Infarction; Peripheral Arterial Disease; Peripheral Venous Disease; Phlebitis; Vasculitis Gastrointestinal Complaints and Symptoms: Negative for: Frequent diarrhea; Nausea; Vomiting Endocrine Complaints and Symptoms: Negative for: Hepatitis; Thyroid disease; Polydypsia (Excessive Thirst) Medical History: Positive for: Type II Diabetes Negative for: Type I Diabetes Jacqueline [name], Jacqueline [name] ([id number]) Treated with: Insulin Genitourinary Complaints and Symptoms: Positive for: Incontinence/dribbling Negative for: Kidney failure/ Dialysis Immunological Complaints and Symptoms: Negative for: Hives; Itching Integumentary (Skin) Complaints and Symptoms: Positive for: Wounds Negative for: Bleeding or bruising tendency; Breakdown; Swelling Medical History: Negative for: History of Burn; History of pressure wounds Musculoskeletal Complaints and Symptoms: Negative for: Muscle Pain; Muscle Weakness Medical History: Positive for: Osteoarthritis Negative for: Gout; Rheumatoid Arthritis; Osteomyelitis Neurologic Complaints and Symptoms: Negative for: Numbness/parasthesias; Focal/Weakness Medical History: Negative for: Dementia; Neuropathy; Quadriplegia; Paraplegia; Seizure Disorder Past Medical History Notes: Parkinson's since 2012 Psychiatric Complaints and Symptoms: Negative for: Anxiety; Claustrophobia Oncologic Immunizations Pneumococcal  Vaccine: Received Pneumococcal Vaccination: Yes Implantable Devices None Family and Social History Cancer: Yes - Mother; Diabetes: No; Heart Disease: No; Hereditary Spherocytosis: No; Hypertension: No; Kidney Disease: No; Lung Disease: No; Seizures: No; Stroke: Yes - Siblings; Thyroid Problems: No; Tuberculosis: No; Never smoker; Marital Jacqueline [name], Jacqueline [name]. ([id number]) Status - Widowed; Alcohol Use: Never; Drug Use: No History; Caffeine Use: Daily; Financial Concerns: No; Food, Clothing or Shelter Needs: No; Support System Lacking: No; Transportation Concerns: No Electronic Signature(s) Signed: 06/26/2019 4:42:47 PM By: Montey Hora Signed: 06/26/2019 4:47:43 PM By: Worthy Keeler PA-C Entered By: Montey Hora on 06/26/2019 10:01:43 Jacqueline [name] ([id number]) -------------------------------------------------------------------------------- [place] Details Patient Name: Jacqueline [name] Date of Service: 06/26/2019 Medical Record Number: [id number] Patient Account Number: [id number] Date of Birth/Sex: March 09, 1936 (84 y.o. F) Treating RN: Cornell Barman Primary Care Provider: Clayborn Bigness Other Clinician: Referring Provider: Clayborn Bigness Treating Provider/Extender: Melburn Hake, Ammar Moffatt Weeks in Treatment: 0 Diagnosis Coding ICD-10 Codes [name] Description [id number] Pressure ulcer of left buttock, stage 3 E11.622 Type 2 diabetes mellitus with other skin ulcer G20 Parkinson's disease I10 Essential (primary) hypertension N39.498 Other specified urinary incontinence Facility Procedures CPT4 [name]: [id number] Description: [phone] - WOUND CARE VISIT-LEV 2 EST PT Modifier: Quantity: 1 CPT4 [name]: [id number] Description: 11042 - DEB SUBQ TISSUE 20 SQ CM/< ICD-10 Diagnosis Description L89.323 Pressure ulcer of left buttock, stage 3 Modifier: Quantity: 1 Physician Procedures CPT4 [name]: [id number] Description: WC PHYS LEVEL 3 o NEW PT ICD-10 Diagnosis Description L89.323 Pressure ulcer of left buttock, stage 3  E11.622 Type 2 diabetes mellitus with other skin ulcer G20 Parkinson's disease I10 Essential (primary) hypertension Modifier: 25 Quantity: 1 CPT4 [name]: [id number] Description: 11042 - WC PHYS SUBQ TISS 20 SQ CM ICD-10 Diagnosis Description L89.323 Pressure ulcer of left buttock, stage 3 Modifier: Quantity: 1 Electronic Signature(s) Signed: 06/26/2019 10:36:59 AM By: Worthy Keeler PA-C Entered By: Worthy Keeler on 06/26/2019 10:36:59

## 2019-07-03 ENCOUNTER — Other Ambulatory Visit: Payer: Self-pay

## 2019-07-03 ENCOUNTER — Encounter: Payer: Medicare HMO | Admitting: Physician Assistant

## 2019-07-03 DIAGNOSIS — G20 Parkinson's disease: Secondary | ICD-10-CM | POA: Diagnosis not present

## 2019-07-03 DIAGNOSIS — Z794 Long term (current) use of insulin: Secondary | ICD-10-CM | POA: Diagnosis not present

## 2019-07-03 DIAGNOSIS — L89323 Pressure ulcer of left buttock, stage 3: Secondary | ICD-10-CM | POA: Diagnosis not present

## 2019-07-03 DIAGNOSIS — E1151 Type 2 diabetes mellitus with diabetic peripheral angiopathy without gangrene: Secondary | ICD-10-CM | POA: Diagnosis not present

## 2019-07-03 DIAGNOSIS — I10 Essential (primary) hypertension: Secondary | ICD-10-CM | POA: Diagnosis not present

## 2019-07-03 DIAGNOSIS — L89329 Pressure ulcer of left buttock, unspecified stage: Secondary | ICD-10-CM | POA: Diagnosis not present

## 2019-07-03 DIAGNOSIS — E11622 Type 2 diabetes mellitus with other skin ulcer: Secondary | ICD-10-CM | POA: Diagnosis not present

## 2019-07-03 NOTE — Progress Notes (Signed)
Jacqueline [name], Jacqueline [name] ([id number]) Visit Report for 07/03/2019 Arrival Information Details Patient Name: Jacqueline [name] Date of Service: 07/03/2019 3:15 PM Medical Record Number: [id number] Patient Account Number: [id number] Date of Birth/Sex: [birth year] (84 y.o. F) Treating RN: Cornell Barman Primary Care Knoxx Boeding: Clayborn Bigness Other Clinician: Referring Nohlan Burdin: Clayborn Bigness Treating Natavia Sublette/Extender: Melburn Hake, HOYT Weeks in Treatment: 1 Visit Information History Since Last Visit Added or deleted any medications: No Patient Arrived: Ambulatory Any new allergies or adverse reactions: No Arrival Time: 15:22 Had a fall or experienced change in No Accompanied By: caregiver activities of daily living that may affect Transfer Assistance: None risk of falls: Patient Identification Verified: Yes Signs or symptoms of abuse/neglect since last visito No Secondary Verification Process Completed: Yes Hospitalized since last visit: No Implantable device outside of the clinic excluding No cellular tissue based products placed in the center since last visit: Has Dressing in Place as Prescribed: Yes Pain Present Now: No Electronic Signature(s) Signed: 07/03/2019 4:35:47 PM By: Gretta Cool, BSN, RN, CWS, Kim RN, BSN Entered By: Gretta Cool, BSN, RN, CWS, Kim on 07/03/2019 15:23:05 Jacqueline [name] ([id number]) -------------------------------------------------------------------------------- Clinic Level of Care Assessment Details Patient Name: Jacqueline [name] Date of Service: 07/03/2019 3:15 PM Medical Record Number: [id number] Patient Account Number: [id number] Date of Birth/Sex: [birth year] (83 y.o. F) Treating RN: Cornell Barman Primary Care Eduardo Wurth: Clayborn Bigness Other Clinician: Referring Rozanne Heumann: Clayborn Bigness Treating Jamayah Myszka/Extender: Melburn Hake, HOYT Weeks in Treatment: 1 Clinic Level of Care Assessment Items TOOL 4 Quantity Score []  - Use when only an EandM is performed on FOLLOW-UP visit 0 ASSESSMENTS -  Nursing Assessment / Reassessment X - Reassessment of Co-morbidities (includes updates in patient status) 1 10 X- 1 5 Reassessment of Adherence to Treatment Plan ASSESSMENTS - Wound and Skin Assessment / Reassessment []  - Simple Wound Assessment / Reassessment - one wound 0 X- 2 5 Complex Wound Assessment / Reassessment - multiple wounds []  - 0 Dermatologic / Skin Assessment (not related to wound area) ASSESSMENTS - Focused Assessment []  - Circumferential Edema Measurements - multi extremities 0 []  - 0 Nutritional Assessment / Counseling / Intervention []  - 0 Lower Extremity Assessment (monofilament, tuning fork, pulses) []  - 0 Peripheral Arterial Disease Assessment (using hand held doppler) ASSESSMENTS - Ostomy and/or Continence Assessment and Care []  - Incontinence Assessment and Management 0 []  - 0 Ostomy Care Assessment and Management (repouching, etc.) PROCESS - Coordination of Care X - Simple Patient / Family Education for ongoing care 1 15 []  - 0 Complex (extensive) Patient / Family Education for ongoing care []  - 0 Staff obtains Programmer, systems, Records, Test Results / Process Orders []  - 0 Staff telephones HHA, Nursing Homes / Clarify orders / etc []  - 0 Routine Transfer to another Facility (non-emergent condition) []  - 0 Routine Hospital Admission (non-emergent condition) []  - 0 New Admissions / Biomedical engineer / Ordering NPWT, Apligraf, etc. []  - 0 Emergency Hospital Admission (emergent condition) X- 1 10 Simple Discharge Coordination Jacqueline [name], Jacqueline [name]. ([id number]) []  - 0 Complex (extensive) Discharge Coordination PROCESS - Special Needs []  - Pediatric / Minor Patient Management 0 []  - 0 Isolation Patient Management []  - 0 Hearing / Language / Visual special needs []  - 0 Assessment of Community assistance (transportation, D/C planning, etc.) []  - 0 Additional assistance / Altered mentation []  - 0 Support Surface(s) Assessment (bed, cushion, seat,  etc.) INTERVENTIONS - Wound Cleansing / Measurement []  - Simple Wound Cleansing - one wound 0 X- 2 5 Complex Wound Cleansing - multiple  wounds X- 1 5 Wound Imaging (photographs - any number of wounds) []  - 0 Wound Tracing (instead of photographs) []  - 0 Simple Wound Measurement - one wound X- 2 5 Complex Wound Measurement - multiple wounds INTERVENTIONS - Wound Dressings []  - Small Wound Dressing one or multiple wounds 0 []  - 0 Medium Wound Dressing one or multiple wounds X- 1 20 Large Wound Dressing one or multiple wounds []  - 0 Application of Medications - topical []  - 0 Application of Medications - injection INTERVENTIONS - Miscellaneous []  - External ear exam 0 []  - 0 Specimen Collection (cultures, biopsies, blood, body fluids, etc.) []  - 0 Specimen(s) / Culture(s) sent or taken to Lab for analysis []  - 0 Patient Transfer (multiple staff / Civil Service fast streamer / Similar devices) []  - 0 Simple Staple / Suture removal (25 or less) []  - 0 Complex Staple / Suture removal (26 or more) []  - 0 Hypo / Hyperglycemic Management (close monitor of Blood Glucose) []  - 0 Ankle / Brachial Index (ABI) - do not check if billed separately X- 1 5 Vital Signs Jacqueline [name], Jacqueline [name] ([id number]) Has the patient been seen at the hospital within the last three years: Yes Total Score: 100 Level Of Care: New/Established - Level 3 Electronic Signature(s) Signed: 07/03/2019 4:35:47 PM By: Gretta Cool, BSN, RN, CWS, Kim RN, BSN Entered By: Gretta Cool, BSN, RN, CWS, Kim on 07/03/2019 15:37:14 Jacqueline [name] ([id number]) -------------------------------------------------------------------------------- Encounter Discharge Information Details Patient Name: Jacqueline [name] Date of Service: 07/03/2019 3:15 PM Medical Record Number: [id number] Patient Account Number: [id number] Date of Birth/Sex: 08-Nov-1935 (83 y.o. F) Treating RN: Cornell Barman Primary Care Kaliope Quinonez: Clayborn Bigness Other Clinician: Referring Ann Groeneveld: Clayborn Bigness Treating Kache Mcclurg/Extender: Melburn Hake, HOYT Weeks in Treatment: 1 Encounter Discharge Information Items Discharge Condition: Stable Ambulatory Status: Ambulatory Discharge Destination: Home Transportation: Private Auto Accompanied By: self Schedule Follow-up Appointment: Yes Clinical Summary of Care: Electronic Signature(s) Signed: 07/03/2019 4:35:47 PM By: Gretta Cool, BSN, RN, CWS, Kim RN, BSN Entered By: Gretta Cool, BSN, RN, CWS, Kim on 07/03/2019 15:38:54 Jacqueline [name] ([id number]) -------------------------------------------------------------------------------- Lower Extremity Assessment Details Patient Name: Jacqueline [name] Date of Service: 07/03/2019 3:15 PM Medical Record Number: [id number] Patient Account Number: [id number] Date of Birth/Sex: 04-10-1936 (83 y.o. F) Treating RN: Cornell Barman Primary Care Estefani Bateson: Clayborn Bigness Other Clinician: Referring Lanessa Shill: Clayborn Bigness Treating Elman Dettman/Extender: Sharalyn Ink in Treatment: 1 Electronic Signature(s) Signed: 07/03/2019 4:35:47 PM By: Gretta Cool, BSN, RN, CWS, Kim RN, BSN Entered By: Gretta Cool, BSN, RN, CWS, Kim on 07/03/2019 15:30:37 Jacqueline [name] ([id number]) -------------------------------------------------------------------------------- Multi Wound Chart Details Patient Name: Jacqueline [name] Date of Service: 07/03/2019 3:15 PM Medical Record Number: [id number] Patient Account Number: [id number] Date of Birth/Sex: Mar 30, 1936 (83 y.o. F) Treating RN: Cornell Barman Primary Care Kamesha Herne: Clayborn Bigness Other Clinician: Referring Cheryllynn Sarff: Clayborn Bigness Treating Henrik Orihuela/Extender: Melburn Hake, HOYT Weeks in Treatment: 1 Vital Signs Height(in): 62 Pulse(bpm): 80 Weight(lbs): 104 Blood Pressure(mmHg): 138/90 Body Mass Index(BMI): 19 Temperature(F): 97.8 Respiratory Rate 16 (breaths/min): Photos: [N/A:N/A] Wound Location: Left, Proximal Gluteus Left, Distal Gluteus N/A Wounding Event: Pressure Injury Pressure Injury  N/A Primary Etiology: Pressure Ulcer Pressure Ulcer N/A Comorbid History: Hypertension, Type II Hypertension, Type II N/A Diabetes, Osteoarthritis Diabetes, Osteoarthritis Date Acquired: 05/26/2019 05/26/2019 N/A Weeks of Treatment: 1 1 N/A Wound Status: Open Open N/A Measurements L x W x D 0.2x0.2x0.1 0.5x0.7x0.1 N/A (cm) Area (cm) : 0.031 0.275 N/A Volume (cm) : 0.003 0.027 N/A % Reduction in Area: 83.50% 79.20% N/A % Reduction  in Volume: 92.10% 89.80% N/A Classification: Category/Stage III Category/Stage III N/A Exudate Amount: Medium Medium N/A Exudate Type: Serous Serous N/A Exudate Color: amber amber N/A Wound Margin: Flat and Intact Flat and Intact N/A Granulation Amount: Large (67-100%) Medium (34-66%) N/A Granulation Quality: Pink Pink N/A Necrotic Amount: None Present (0%) Medium (34-66%) N/A Exposed Structures: Fascia: No Fat Layer (Subcutaneous N/A Fat Layer (Subcutaneous Tissue) Exposed: Yes Tissue) Exposed: No Fascia: No Tendon: No Tendon: No Muscle: No Muscle: No Joint: No Joint: No Bone: No Jacqueline [name], Jacqueline [name] ([id number]) Bone: No Limited to Skin Breakdown Epithelialization: Large (67-100%) Small (1-33%) N/A Treatment Notes Electronic Signature(s) Signed: 07/03/2019 4:35:47 PM By: Gretta Cool, BSN, RN, CWS, Kim RN, BSN Entered By: Gretta Cool, BSN, RN, CWS, Kim on 07/03/2019 15:34:19 Jacqueline [name] ([id number]) -------------------------------------------------------------------------------- [place] Details Patient Name: Jacqueline [name] Date of Service: 07/03/2019 3:15 PM Medical Record Number: [id number] Patient Account Number: [id number] Date of Birth/Sex: 06-08-1936 (83 y.o. F) Treating RN: Cornell Barman Primary Care Johann Gascoigne: Clayborn Bigness Other Clinician: Referring Tyeesha Riker: Clayborn Bigness Treating Cassandra Mcmanaman/Extender: Melburn Hake, HOYT Weeks in Treatment: 1 Active Inactive Necrotic Tissue Nursing Diagnoses: Impaired tissue integrity related to  necrotic/devitalized tissue Goals: Necrotic/devitalized tissue will be minimized in the wound bed Date Initiated: 06/26/2019 Target Resolution Date: 08/07/2019 Goal Status: Active Patient/caregiver will verbalize understanding of reason and process for debridement of necrotic tissue Date Initiated: 06/26/2019 Target Resolution Date: 08/07/2019 Goal Status: Active Interventions: Assess patient pain level pre-, during and post procedure and prior to discharge Provide education on necrotic tissue and debridement process Treatment Activities: Apply topical anesthetic as ordered : 06/26/2019 Excisional debridement : 06/26/2019 Notes: Nutrition Nursing Diagnoses: Imbalanced nutrition Goals: Patient/caregiver verbalizes understanding of need to maintain therapeutic glucose control per primary care physician Date Initiated: 06/26/2019 Target Resolution Date: 07/10/2019 Goal Status: Active Interventions: Provide education on elevated blood sugars and impact on wound healing Treatment Activities: Patient referred to dietician for evaluation and possible medical nutrition therapy : 06/26/2019 Notes: Orientation to the Wound Care Program [place]. ([id number]) Nursing Diagnoses: Knowledge deficit related to the wound healing center program Goals: Patient/caregiver will verbalize understanding of the [facility] Date Initiated: 06/26/2019 Target Resolution Date: 07/10/2019 Goal Status: Active Interventions: Provide education on orientation to the wound center Notes: Pain, Acute or Chronic Nursing Diagnoses: Pain Management - Non-cyclic Acute (Procedural) Goals: Patient will verbalize adequate pain control and receive pain control interventions during procedures as needed Date Initiated: 06/26/2019 Target Resolution Date: 07/10/2019 Goal Status: Active Interventions: Reposition patient for comfort Treatment Activities: Administer pain control measures as ordered :  06/26/2019 Notes: Pressure Nursing Diagnoses: Knowledge deficit related to management of pressures ulcers Potential for impaired tissue integrity related to pressure, friction, moisture, and shear Goals: Patient will remain free from development of additional pressure ulcers Date Initiated: 06/26/2019 Target Resolution Date: 07/10/2019 Goal Status: Active Interventions: Assess: immobility, friction, shearing, incontinence upon admission and as needed Notes: Wound/Skin Impairment Nursing Diagnoses: Impaired tissue integrity Goals: Ulcer/skin breakdown will have a volume reduction of 30% by week 4 Jacqueline [name], Jacqueline [name] ([id number]) Date Initiated: 06/26/2019 Target Resolution Date: 07/27/2019 Goal Status: Active Interventions: Assess patient/caregiver ability to obtain necessary supplies Assess patient/caregiver ability to perform ulcer/skin care regimen upon admission and as needed Treatment Activities: Referred to DME Saul Dorsi for dressing supplies : 06/26/2019 Skin care regimen initiated : 06/26/2019 Notes: Electronic Signature(s) Signed: 07/03/2019 4:35:47 PM By: Gretta Cool, BSN, RN, CWS, Kim RN, BSN Entered By: Gretta Cool, BSN, RN, CWS, Kim on 07/03/2019 15:34:09  Jacqueline [name], Jacqueline [name] ([id number]) -------------------------------------------------------------------------------- Pain Assessment Details Patient Name: Jacqueline [name], Jacqueline [name] Date of Service: 07/03/2019 3:15 PM Medical Record Number: [id number] Patient Account Number: [id number] Date of Birth/Sex: March 09, 1936 (84 y.o. F) Treating RN: Cornell Barman Primary Care Kunaal Walkins: Clayborn Bigness Other Clinician: Referring Delvon Chipps: Clayborn Bigness Treating Tysin Salada/Extender: Melburn Hake, HOYT Weeks in Treatment: 1 Active Problems Location of Pain Severity and Description of Pain Patient Has Paino No Site Locations Pain Management and Medication Current Pain Management: Notes Pressure, no pain. Electronic Signature(s) Signed: 07/03/2019 4:35:47 PM By: Gretta Cool,  BSN, RN, CWS, Kim RN, BSN Entered By: Gretta Cool, BSN, RN, CWS, Kim on 07/03/2019 15:23:23 Jacqueline [name] ([id number]) -------------------------------------------------------------------------------- Patient/Caregiver Education Details Patient Name: Jacqueline [name] Date of Service: 07/03/2019 3:15 PM Medical Record Number: [id number] Patient Account Number: [id number] Date of Birth/Gender: [birth year] (83 y.o. F) Treating RN: Cornell Barman Primary Care Physician: Clayborn Bigness Other Clinician: Referring Physician: Clayborn Bigness Treating Physician/Extender: Sharalyn Ink in Treatment: 1 Education Assessment Education Provided To: Patient Education Topics Provided Wound/Skin Impairment: Handouts: Caring for Your Ulcer Methods: Demonstration, Explain/Verbal Responses: State content correctly Electronic Signature(s) Signed: 07/03/2019 4:35:47 PM By: Gretta Cool, BSN, RN, CWS, Kim RN, BSN Entered By: Gretta Cool, BSN, RN, CWS, Kim on 07/03/2019 15:37:56 Jacqueline [name] ([id number]) -------------------------------------------------------------------------------- Wound Assessment Details Patient Name: Jacqueline [name] Date of Service: 07/03/2019 3:15 PM Medical Record Number: [id number] Patient Account Number: [id number] Date of Birth/Sex: September 19, 1935 (83 y.o. F) Treating RN: Cornell Barman Primary Care Nolton Denis: Clayborn Bigness Other Clinician: Referring Mersades Barbaro: Clayborn Bigness Treating Zendaya Groseclose/Extender: Melburn Hake, HOYT Weeks in Treatment: 1 Wound Status Wound Number: 1 Primary Etiology: Pressure Ulcer Wound Location: Left, Proximal Gluteus Wound Status: Open Wounding Event: Pressure Injury Comorbid Hypertension, Type II Diabetes, History: Osteoarthritis Date Acquired: 05/26/2019 Weeks Of Treatment: 1 Clustered Wound: No Photos Wound Measurements Length: (cm) 0.2 % Reduction Width: (cm) 0.2 % Reduction Depth: (cm) 0.1 Epitheliali Area: (cm) 0.031 Tunneling: Volume: (cm) 0.003 Underminin in Area:  83.5% in Volume: 92.1% zation: Large (67-100%) No g: No Wound Description Classification: Category/Stage III Foul Odor A Wound Margin: Flat and Intact Slough/Fibr Exudate Amount: Medium Exudate Type: Serous Exudate Color: amber fter Cleansing: No ino Yes Wound Bed Granulation Amount: Large (67-100%) Exposed Structure Granulation Quality: Pink Fascia Exposed: No Necrotic Amount: None Present (0%) Fat Layer (Subcutaneous Tissue) Exposed: No Tendon Exposed: No Muscle Exposed: No Joint Exposed: No Bone Exposed: No Limited to Skin Breakdown Treatment Notes Jacqueline [name], Jacqueline [name] ([id number]) Wound #1 (Left, Proximal Gluteus) Notes Prisma Ag, Bordered Foam Dressing Electronic Signature(s) Signed: 07/03/2019 4:35:47 PM By: Gretta Cool, BSN, RN, CWS, Kim RN, BSN Entered By: Gretta Cool, BSN, RN, CWS, Kim on 07/03/2019 15:31:48 Jacqueline [name] ([id number]) -------------------------------------------------------------------------------- Wound Assessment Details Patient Name: Jacqueline [name] Date of Service: 07/03/2019 3:15 PM Medical Record Number: [id number] Patient Account Number: [id number] Date of Birth/Sex: 07-28-1935 (83 y.o. F) Treating RN: Cornell Barman Primary Care Valera Vallas: Clayborn Bigness Other Clinician: Referring Bayley Hurn: Clayborn Bigness Treating Dolores Mcgovern/Extender: Melburn Hake, HOYT Weeks in Treatment: 1 Wound Status Wound Number: 2 Primary Etiology: Pressure Ulcer Wound Location: Left, Distal Gluteus Wound Status: Open Wounding Event: Pressure Injury Comorbid Hypertension, Type II Diabetes, History: Osteoarthritis Date Acquired: 05/26/2019 Weeks Of Treatment: 1 Clustered Wound: No Photos Wound Measurements Length: (cm) 0.5 % Reduction i Width: (cm) 0.7 % Reduction i Depth: (cm) 0.1 Epithelializa Area: (cm) 0.275 Tunneling: Volume: (cm) 0.027 Undermining: n Area: 79.2% n Volume: 89.8% tion: Small (1-33%) No No Wound Description Classification: Category/Stage III Foul Odor  Aft Wound Margin: Flat and Intact Slough/Fibrin Exudate Amount: Medium Exudate Type: Serous Exudate Color: amber er Cleansing: No o Yes Wound Bed Granulation Amount: Medium (34-66%) Exposed Structure Granulation Quality: Pink Fascia Exposed: No Necrotic Amount: Medium (34-66%) Fat Layer (Subcutaneous Tissue) Exposed: Yes Necrotic Quality: Adherent Slough Tendon Exposed: No Muscle Exposed: No Joint Exposed: No Bone Exposed: No Treatment Notes Jacqueline [name], Jacqueline [name] ([id number]) Wound #2 (Left, Distal Gluteus) Notes Prisma Ag, Bordered Foam Dressing Electronic Signature(s) Signed: 07/03/2019 4:35:47 PM By: Gretta Cool, BSN, RN, CWS, Kim RN, BSN Entered By: Gretta Cool, BSN, RN, CWS, Kim on 07/03/2019 15:31:49 Jacqueline [name] ([id number]) -------------------------------------------------------------------------------- Vitals Details Patient Name: Jacqueline [name] Date of Service: 07/03/2019 3:15 PM Medical Record Number: [id number] Patient Account Number: [id number] Date of Birth/Sex: 1935-11-25 (84 y.o. F) Treating RN: Cornell Barman Primary Care Jezel Basto: Clayborn Bigness Other Clinician: Referring Ammy Lienhard: Clayborn Bigness Treating Sriansh Farra/Extender: Melburn Hake, HOYT Weeks in Treatment: 1 Vital Signs Time Taken: 15:23 Temperature (F): 97.8 Height (in): 62 Pulse (bpm): 80 Weight (lbs): 104 Respiratory Rate (breaths/min): 16 Body Mass Index (BMI): 19 Blood Pressure (mmHg): 138/90 Reference Range: 80 - 120 mg / dl Electronic Signature(s) Signed: 07/03/2019 4:35:47 PM By: Gretta Cool, BSN, RN, CWS, Kim RN, BSN Entered By: Gretta Cool, BSN, RN, CWS, Kim on 07/03/2019 15:24:18

## 2019-07-03 NOTE — Progress Notes (Addendum)
CABRINI, WINBERRY ([id number]) Visit Report for 07/03/2019 Chief Complaint Document Details Patient Name: Jacqueline [name], Jacqueline [name] Date of Service: 07/03/2019 3:15 PM Medical Record Number: [id number] Patient Account Number: [id number] Date of Birth/Sex: 12-26-1935 (84 y.o. F) Treating RN: Cornell Barman Primary Care Provider: Clayborn Bigness Other Clinician: Referring Provider: Clayborn Bigness Treating Provider/Extender: Melburn Hake, Mirta Mally Weeks in Treatment: 1 Information Obtained from: Patient Chief Complaint Left buttock pressure ulcer Electronic Signature(s) Signed: 07/03/2019 3:27:02 PM By: Worthy Keeler PA-C Entered By: Worthy Keeler on 07/03/2019 15:27:02 Jacqueline [name] ([id number]) -------------------------------------------------------------------------------- HPI Details Patient Name: Jacqueline [name] Date of Service: 07/03/2019 3:15 PM Medical Record Number: [id number] Patient Account Number: [id number] Date of Birth/Sex: [birth year] (83 y.o. F) Treating RN: Cornell Barman Primary Care Provider: Clayborn Bigness Other Clinician: Referring Provider: Clayborn Bigness Treating Provider/Extender: Melburn Hake, Tanith Dagostino Weeks in Treatment: 1 History of Present Illness HPI Description: 06/26/2019 on evaluation today patient appears to be doing decently well with regard to her left gluteal wounds upon initial inspection today here in the clinic. Fortunately there is no evidence of active infection at this point. She has been attempting to manage this for a little over a month on her own with the help of her caregiver she has been using Neosporin on the area. Unfortunately there really has not been any significant improvement during that time. She does have Parkinson's disease and does have a lot of spasming which causes her to have jerking movements and her caregiver believes the way she sits that she is probably rubbing and causing issues in this area as well which is likely what contributed to the formation of the  wounds. She is previously had when they were able to heal without having to come into the clinic. Unfortunately this is just not doing nearly as well. The patient does have a history of diabetes mellitus type 1 type II with a hemoglobin A1c of 7.7 on last check which was 04/26/2019.She also has a history of hypertension as well as Parkinson's disease as mentioned above and urinary incontinence. 07/03/2019 upon evaluation today patient appears to be doing very well in regard to her wounds. She has been practicing offloading it very well she spent more time in the bed which is good news. This obviously is done very well for her and she seems to be progressing quite nicely. The wounds in fact are measuring about half the size they were last week. Electronic Signature(s) Signed: 07/03/2019 3:58:01 PM By: Worthy Keeler PA-C Entered By: Worthy Keeler on 07/03/2019 15:58:01 Jacqueline [name] ([id number]) -------------------------------------------------------------------------------- Physical Exam Details Patient Name: Jacqueline [name] Date of Service: 07/03/2019 3:15 PM Medical Record Number: [id number] Patient Account Number: [id number] Date of Birth/Sex: 1935-10-11 (84 y.o. F) Treating RN: Cornell Barman Primary Care Provider: Clayborn Bigness Other Clinician: Referring Provider: Clayborn Bigness Treating Provider/Extender: Melburn Hake, Ronya Gilcrest Weeks in Treatment: 1 Constitutional Well-nourished and well-hydrated in no acute distress. Respiratory normal breathing without difficulty. Psychiatric this patient is able to make decisions and demonstrates good insight into disease process. Alert and Oriented x 3. pleasant and cooperative. Notes Upon inspection patient's wound showed signs of good granulation and epithelization. Minimal slough noted mechanically debrided away but otherwise there does not appear to be any need for sharp debridement at this point which is good news. Electronic Signature(s) Signed:  07/03/2019 3:58:16 PM By: Worthy Keeler PA-C Entered By: Worthy Keeler on 07/03/2019 15:58:16 Jacqueline [name] ([id number]) -------------------------------------------------------------------------------- Physician Orders Details Patient Name: BEMUS,  Hans Eden Date of Service: 07/03/2019 3:15 PM Medical Record Number: [id number] Patient Account Number: [id number] Date of Birth/Sex: 24-Oct-1935 (84 y.o. F) Treating RN: Cornell Barman Primary Care Provider: Clayborn Bigness Other Clinician: Referring Provider: Clayborn Bigness Treating Provider/Extender: Melburn Hake, Rawlins Stuard Weeks in Treatment: 1 Verbal / Phone Orders: No Diagnosis Coding ICD-10 Coding [name] Description [phone] Pressure ulcer of left buttock, stage 3 E11.622 Type 2 diabetes mellitus with other skin ulcer G20 Parkinson's disease I10 Essential (primary) hypertension N39.498 Other specified urinary incontinence Wound Cleansing Wound #1 Left,Proximal Gluteus o Clean wound with Normal Saline. o Dial antibacterial soap, wash wounds, rinse and pat dry prior to dressing wounds Wound #2 Left,Distal Gluteus o Clean wound with Normal Saline. o Dial antibacterial soap, wash wounds, rinse and pat dry prior to dressing wounds Anesthetic (add to Medication List) Wound #1 Left,Proximal Gluteus o Topical Lidocaine 4% cream applied to wound bed prior to debridement (In Clinic Only). Wound #2 Left,Distal Gluteus o Topical Lidocaine 4% cream applied to wound bed prior to debridement (In Clinic Only). Primary Wound Dressing Wound #1 Left,Proximal Gluteus o Silver Collagen - moistened with saline Wound #2 Left,Distal Gluteus o Silver Collagen - moistened with saline Secondary Dressing Wound #1 Left,Proximal Gluteus o Boardered Foam Dressing Wound #2 Left,Distal Gluteus o Boardered Foam Dressing Dressing Change Frequency Wound #1 Left,Proximal Gluteus o Change dressing every other day. Jacqueline [name], Jacqueline [name] [place]. ([id number]) Wound #2  Left,Distal Gluteus o Change dressing every other day. Follow-up Appointments Wound #1 Left,Proximal Gluteus o Return Appointment in 1 week. Wound #2 Left,Distal Gluteus o Return Appointment in 1 week. Off-Loading Wound #1 Left,Proximal Gluteus o Turn and reposition every 2 hours o Other: - Keep pressure off of area. Wound #2 Left,Distal Gluteus o Turn and reposition every 2 hours o Other: - Keep pressure off of area. Additional Orders / Instructions Wound #1 Left,Proximal Gluteus o Increase protein intake. Wound #2 Left,Distal Gluteus o Increase protein intake. Electronic Signature(s) Signed: 07/03/2019 4:16:35 PM By: Worthy Keeler PA-C Signed: 07/03/2019 4:35:47 PM By: Gretta Cool, BSN, RN, CWS, Kim RN, BSN Entered By: Gretta Cool, BSN, RN, CWS, Kim on 07/03/2019 15:36:41 Jacqueline [name] ([id number]) -------------------------------------------------------------------------------- Problem List Details Patient Name: Jacqueline [name] Date of Service: 07/03/2019 3:15 PM Medical Record Number: [id number] Patient Account Number: [id number] Date of Birth/Sex: [birth year] (83 y.o. F) Treating RN: Cornell Barman Primary Care Provider: Clayborn Bigness Other Clinician: Referring Provider: Clayborn Bigness Treating Provider/Extender: Melburn Hake, Tykee Heideman Weeks in Treatment: 1 Active Problems ICD-10 Evaluated Encounter [name] Description Active Date Today Diagnosis L89.323 Pressure ulcer of left buttock, stage 3 06/26/2019 No Yes E11.622 Type 2 diabetes mellitus with other skin ulcer 06/26/2019 No Yes G20 Parkinson's disease 06/26/2019 No Yes I10 Essential (primary) hypertension 06/26/2019 No Yes N39.498 Other specified urinary incontinence 06/26/2019 No Yes Inactive Problems Resolved Problems Electronic Signature(s) Signed: 07/03/2019 3:26:46 PM By: Worthy Keeler PA-C Entered By: Worthy Keeler on 07/03/2019 15:26:46 Jacqueline [name]  ([id number]) -------------------------------------------------------------------------------- Progress Note Details Patient Name: Jacqueline [name] Date of Service: 07/03/2019 3:15 PM Medical Record Number: [id number] Patient Account Number: [id number] Date of Birth/Sex: 23-Sep-1935 (83 y.o. F) Treating RN: Cornell Barman Primary Care Provider: Clayborn Bigness Other Clinician: Referring Provider: Clayborn Bigness Treating Provider/Extender: Melburn Hake, Daira Hine Weeks in Treatment: 1 Subjective Chief Complaint Information obtained from Patient Left buttock pressure ulcer History of Present Illness (HPI) 06/26/2019 on evaluation today patient appears to be doing decently well with regard to her left gluteal wounds upon initial inspection today here in  the clinic. Fortunately there is no evidence of active infection at this point. She has been attempting to manage this for a little over a month on her own with the help of her caregiver she has been using Neosporin on the area. Unfortunately there really has not been any significant improvement during that time. She does have Parkinson's disease and does have a lot of spasming which causes her to have jerking movements and her caregiver believes the way she sits that she is probably rubbing and causing issues in this area as well which is likely what contributed to the formation of the wounds. She is previously had when they were able to heal without having to come into the clinic. Unfortunately this is just not doing nearly as well. The patient does have a history of diabetes mellitus type 1 type II with a hemoglobin A1c of 7.7 on last check which was 04/26/2019.She also has a history of hypertension as well as Parkinson's disease as mentioned above and urinary incontinence. 07/03/2019 upon evaluation today patient appears to be doing very well in regard to her wounds. She has been practicing offloading it very well she spent more time in the bed which is good news.  This obviously is done very well for her and she seems to be progressing quite nicely. The wounds in fact are measuring about half the size they were last week. Objective Constitutional Well-nourished and well-hydrated in no acute distress. Vitals Time Taken: 3:23 PM, Height: 62 in, Weight: 104 lbs, BMI: 19, Temperature: 97.8 F, Pulse: 80 bpm, Respiratory Rate: 16 breaths/min, Blood Pressure: 138/90 mmHg. Respiratory normal breathing without difficulty. Psychiatric this patient is able to make decisions and demonstrates good insight into disease process. Alert and Oriented x 3. pleasant and cooperative. General Notes: Upon inspection patient's wound showed signs of good granulation and epithelization. Minimal slough noted Jacqueline [name], Jacqueline [name]. ([id number]) mechanically debrided away but otherwise there does not appear to be any need for sharp debridement at this point which is good news. Integumentary (Hair, Skin) Wound #1 status is Open. Original cause of wound was Pressure Injury. The wound is located on the Left,Proximal Gluteus. The wound measures 0.2cm length x 0.2cm width x 0.1cm depth; 0.031cm^2 area and 0.003cm^3 volume. The wound is limited to skin breakdown. There is no tunneling or undermining noted. There is a medium amount of serous drainage noted. The wound margin is flat and intact. There is large (67-100%) pink granulation within the wound bed. There is no necrotic tissue within the wound bed. Wound #2 status is Open. Original cause of wound was Pressure Injury. The wound is located on the Left,Distal Gluteus. The wound measures 0.5cm length x 0.7cm width x 0.1cm depth; 0.275cm^2 area and 0.027cm^3 volume. There is Fat Layer (Subcutaneous Tissue) Exposed exposed. There is no tunneling or undermining noted. There is a medium amount of serous drainage noted. The wound margin is flat and intact. There is medium (34-66%) pink granulation within the wound bed. There is a medium  (34-66%) amount of necrotic tissue within the wound bed including Adherent Slough. Assessment Active Problems ICD-10 Pressure ulcer of left buttock, stage 3 Type 2 diabetes mellitus with other skin ulcer Parkinson's disease Essential (primary) hypertension Other specified urinary incontinence Plan Wound Cleansing: Wound #1 Left,Proximal Gluteus: Clean wound with Normal Saline. Dial antibacterial soap, wash wounds, rinse and pat dry prior to dressing wounds Wound #2 Left,Distal Gluteus: Clean wound with Normal Saline. Dial antibacterial soap, wash wounds, rinse and pat dry  prior to dressing wounds Anesthetic (add to Medication List): Wound #1 Left,Proximal Gluteus: Topical Lidocaine 4% cream applied to wound bed prior to debridement (In Clinic Only). Wound #2 Left,Distal Gluteus: Topical Lidocaine 4% cream applied to wound bed prior to debridement (In Clinic Only). Primary Wound Dressing: Wound #1 Left,Proximal Gluteus: Silver Collagen - moistened with saline Wound #2 Left,Distal Gluteus: Silver Collagen - moistened with saline Secondary Dressing: Wound #1 Left,Proximal Gluteus: Boardered Foam Dressing Wound #2 Left,Distal GluteusKADIATU, Jacqueline [name] ([id number]) Boardered Foam Dressing Dressing Change Frequency: Wound #1 Left,Proximal Gluteus: Change dressing every other day. Wound #2 Left,Distal Gluteus: Change dressing every other day. Follow-up Appointments: Wound #1 Left,Proximal Gluteus: Return Appointment in 1 week. Wound #2 Left,Distal Gluteus: Return Appointment in 1 week. Off-Loading: Wound #1 Left,Proximal Gluteus: Turn and reposition every 2 hours Other: - Keep pressure off of area. Wound #2 Left,Distal Gluteus: Turn and reposition every 2 hours Other: - Keep pressure off of area. Additional Orders / Instructions: Wound #1 Left,Proximal Gluteus: Increase protein intake. Wound #2 Left,Distal Gluteus: Increase protein intake. 1. My suggestion at this time  is good to be that we going to continue with the current wound care measures which includes the silver collagen dressing. I think this is done very well for her up to this point and I am hopeful that she will continue to show signs of good improvement. 2. I do believe that she needs to continue focusing on appropriate offloading she is done excellent with this currently and I think she can continue to show signs of improvement as such with continued diligence in this regard. When she is healed obviously she would not have to be quite as diligent although keep an eye on the area and watching out as far as will be an ongoing thing for her caregiver to do. We will see patient back for reevaluation in 1 week here in the clinic. If anything worsens or changes patient will contact our office for additional recommendations. Electronic Signature(s) Signed: 07/03/2019 4:01:26 PM By: Worthy Keeler PA-C Entered By: Worthy Keeler on 07/03/2019 16:01:26 Jacqueline [name] ([id number]) -------------------------------------------------------------------------------- SuperBill Details Patient Name: Jacqueline [name] Date of Service: 07/03/2019 Medical Record Number: [id number] Patient Account Number: [id number] Date of Birth/Sex: [birth year] (83 y.o. F) Treating RN: Cornell Barman Primary Care Provider: Clayborn Bigness Other Clinician: Referring Provider: Clayborn Bigness Treating Provider/Extender: Melburn Hake, Anshi Jalloh Weeks in Treatment: 1 Diagnosis Coding ICD-10 Codes [name] Description [phone] Pressure ulcer of left buttock, stage 3 E11.622 Type 2 diabetes mellitus with other skin ulcer G20 Parkinson's disease I10 Essential (primary) hypertension N39.498 Other specified urinary incontinence Facility Procedures CPT4 [name]: [id number] Description: [id number] - WOUND CARE VISIT-LEV 3 EST PT Modifier: Quantity: 1 Physician Procedures CPT4 [name]: [id number] Description: [id number] - WC PHYS LEVEL 3 - EST PT ICD-10 Diagnosis  Description L89.323 Pressure ulcer of left buttock, stage 3 E11.622 Type 2 diabetes mellitus with other skin ulcer G20 Parkinson's disease I10 Essential (primary) hypertension Modifier: Quantity: 1 Electronic Signature(s) Signed: 07/03/2019 4:02:00 PM By: Worthy Keeler PA-C Entered By: Worthy Keeler on 07/03/2019 16:01:59

## 2019-07-08 DIAGNOSIS — R69 Illness, unspecified: Secondary | ICD-10-CM | POA: Diagnosis not present

## 2019-07-10 ENCOUNTER — Encounter: Payer: Medicare HMO | Admitting: Physician Assistant

## 2019-07-10 ENCOUNTER — Ambulatory Visit: Payer: Medicare HMO | Admitting: Speech Pathology

## 2019-07-12 ENCOUNTER — Ambulatory Visit: Payer: Medicare HMO | Attending: Neurology | Admitting: Speech Pathology

## 2019-07-12 ENCOUNTER — Telehealth: Payer: Self-pay

## 2019-07-12 ENCOUNTER — Encounter: Payer: Self-pay | Admitting: Speech Pathology

## 2019-07-12 ENCOUNTER — Other Ambulatory Visit: Payer: Self-pay

## 2019-07-12 DIAGNOSIS — R490 Dysphonia: Secondary | ICD-10-CM | POA: Diagnosis not present

## 2019-07-12 NOTE — Telephone Encounter (Signed)
Called lmom informing patient needs to reschedule appointment on 12/20/2019. klh

## 2019-07-12 NOTE — Therapy (Signed)
[place] MAIN [facility] SERVICES [address] [place], [place], [address] Phone: [phone]   Fax:  [phone]  Speech Language Pathology Evaluation  Patient Details  Name: Jacqueline [name] MRN: [id number] Date of Birth: 04-16-1936 Referring Provider (SLP): Dr. Melrose Nakayama   Encounter Date: 07/12/2019  End of Session - 07/12/19 1425    Visit Number  1    Number of Visits  17    Date for SLP Re-Evaluation  09/06/19    Authorization Type  Medicare    Authorization Time Period  Start 07/12/2019    Authorization - Visit Number  1    Authorization - Number of Visits  10    SLP Start Time  1100    SLP Stop Time   1150    SLP Time Calculation (min)  50 min    Activity Tolerance  Patient tolerated treatment well       Past Medical History:  Diagnosis Date  . Asthma   . Collapsed lung   . Diabetes mellitus without complication ([place])    Patient takes Insulin twice a day.   Marland Kitchen GERD (gastroesophageal reflux disease)   . Hyperlipidemia   . Hypertension   . Hypothyroidism   . Parkinson's disease ([place])   . Reactive airway disease     Past Surgical History:  Procedure Laterality Date  . ABDOMINAL HYSTERECTOMY    . CHOLECYSTECTOMY    . COLONOSCOPY WITH PROPOFOL N/A 03/29/2015   Procedure: COLONOSCOPY WITH PROPOFOL;  Surgeon: Hulen Luster, MD;  Location: [facility] ENDOSCOPY;  Service: Gastroenterology;  Laterality: N/A;  . ESOPHAGOGASTRODUODENOSCOPY (EGD) WITH PROPOFOL N/A 03/29/2015   Procedure: ESOPHAGOGASTRODUODENOSCOPY (EGD) WITH PROPOFOL;  Surgeon: Hulen Luster, MD;  Location: [facility] ENDOSCOPY;  Service: Gastroenterology;  Laterality: N/A;  . lung collapse     broken rib from fall punctured lung    There were no vitals filed for this visit.      SLP Evaluation [facility] - 07/12/19 0001      SLP Visit Information   SLP Received On  07/12/19    Referring Provider (SLP)  Dr. Melrose Nakayama    Onset Date  03/23/2019    Medical Diagnosis  Parkinson's disease       Subjective   Subjective  Patient reports that "people can't hear me on the phone"     Patient/Family Stated Goal  Loud enough to be heard on the phone      General Information   HPI  Jacqueline [name] is an 84 year old woman with Parkinson's referred for speech therapy secondary hypophonia.        Prior Functional Status   Cognitive/Linguistic Baseline  Baseline deficits    Baseline deficit details  Dysphonia due to Parkinson's      Oral Motor/Sensory Function   Overall Oral Motor/Sensory Function  Appears within functional limits for tasks assessed      Motor Speech   Overall Motor Speech  Impaired    Respiration  Impaired    Level of Impairment  Sentence    Phonation  Hoarse;Low vocal intensity    Resonance  Within functional limits    Articulation  Within functional limitis    Intelligibility  Intelligible    Phonation  Impaired    Tension Present  Jaw;Neck;Shoulder    Volume  Soft    Pitch  Low      Standardized Assessments   Standardized Assessments   Other Assessment   Perceptual Voice Evaluation  Perceptual Voice Evaluation Maximum phonation time for sustained "ah": 8 seconds Mean intensity during sustained "ah": 70 dB  Mean intensity sustained during conversational speech: 60 dB Highest dynamic pitch when altering pitch from a low note to a high note: 254 Hz Lowest dynamic pitch when altering from a high note to a low note: 146 Hz Habitual pitch: 202 Hz  Average fundamental frequency during sustained "ah": 164 Hz (3 STD below average for age and gender) Visi-Pitch: Museum/gallery exhibitions officer (MDVP)  MDVPT extracts objective quantitative values (Relative Average Perturbation, Shimmer, Voice Turbulence Index, and Noise to Harmonic Ratio) on sustained phonation, which are displayed graphically and numerically in comparison to a built-in normative database.  The patient exhibited values outside the norm for Relative Average Perturbation, Shimmer, and Noise to  Harmonic Ratio.  Average fundamental frequency was below average for age and gender.  The patient improved all parameters when cued to alter voicing ("loud like me").  Simulatability: Improved vocal quality with loud voice.  SLP Education - 07/12/19 1424    Education Details  Flow phonation, voice building HEP    Person(s) Educated  Patient;Caregiver(s)    Methods  Explanation;Demonstration;Handout    Comprehension  Verbalized understanding         SLP Long Term Goals - 07/12/19 1427      SLP LONG TERM GOAL #1   Title  The patient will minimize vocal tension via flow phonation and/or resonant voice therapy (or comparable techniques) with min SLP cues with 80% accuracy.    Time  8    Period  Weeks    Status  New    Target Date  09/06/19      SLP LONG TERM GOAL #2   Title  The patient will maximize voice quality and loudness using breath support/oral resonance for sustained vowel production, pitch glides, and hierarchal speech drill.    Time  8    Period  Weeks    Status  New    Target Date  09/06/19      SLP LONG TERM GOAL #3   Title  The patient will maximize voice quality and loudness using breath support/oral resonance for paragraph length recitation with 80% accuracy.    Time  8    Period  Weeks    Status  New    Target Date  09/06/19       Plan - 07/12/19 1426    Clinical Impression Statement  This 84 year old woman diagnosed with Parkinson's disease is presenting with moderate dysphonia.   The patient demonstrates hoarse vocal quality, reduced breath control for speech, excessive pharyngeal resonance, strained/tense phonation, limited pitch range, vocal fatigue, and laryngeal tension. She will benefit from voice therapy for education, to improve breath support, improve tone focus, promote easy flow phonation, and learn techniques to increase loudness and pitch range without strain.    Speech Therapy Frequency  2x / week    Duration  Other (comment)   8 weeks    Treatment/Interventions  SLP instruction and feedback;Patient/family education;Other (comment)   Voice therapy   Potential to Achieve Goals  Good    Potential Considerations  Ability to learn/carryover information;Previous level of function;Co-morbidities;Severity of impairments;Cooperation/participation level;Medical prognosis;Family/community support       Patient will benefit from skilled therapeutic intervention in order to improve the following deficits and impairments:   Dysphonia - Plan: SLP plan of care cert/re-cert    Problem List Patient Active Problem List   Diagnosis Date Noted  . Pressure injury  of skin of sacral region 06/12/2019  . Wound healing, delayed 06/12/2019  . Encounter for general adult medical examination with abnormal findings 06/21/2018  . Uncontrolled type 2 diabetes mellitus with hyperglycemia ([place]) 06/21/2018  . Asthma without status asthmaticus 07/08/2016  . Hyperlipidemia, unspecified 07/08/2016  . Dysphagia 01/07/2016  . Unsteadiness 10/01/2015  . Type 2 diabetes mellitus without complication, without long-term current use of insulin ([place]) 08/07/2015  . Mixed Alzheimer's and vascular dementia ([place]) 08/06/2015  . Controlled type 2 diabetes mellitus without complication, without long-term current use of insulin ([place]) 02/12/2015  . Diabetes type 2, controlled ([place]) 06/19/2014  . Essential hypertension 03/08/2014  . Parkinson's disease ([place]) 01/16/2014  . Fatigue 11/09/2013  . Diabetes mellitus, type II ([place]) 09/15/2013  . Difficulty walking 09/15/2013  . Dizziness 09/15/2013   Leroy Sea, MS/CCC- SLP  Lou Miner 07/12/2019, 2:33 PM  [place] [facility] SERVICES [address] [place], [place], [address] Phone: [phone]   Fax:  [phone]  Name: KARRIN STACY MRN: [id number] Date of Birth: [birth year]

## 2019-07-17 ENCOUNTER — Encounter: Payer: Medicare HMO | Attending: Physician Assistant | Admitting: Physician Assistant

## 2019-07-17 ENCOUNTER — Encounter: Payer: Self-pay | Admitting: Speech Pathology

## 2019-07-17 ENCOUNTER — Ambulatory Visit: Payer: Medicare HMO | Admitting: Speech Pathology

## 2019-07-17 ENCOUNTER — Other Ambulatory Visit: Payer: Self-pay

## 2019-07-17 DIAGNOSIS — E119 Type 2 diabetes mellitus without complications: Secondary | ICD-10-CM | POA: Diagnosis not present

## 2019-07-17 DIAGNOSIS — G20 Parkinson's disease: Secondary | ICD-10-CM | POA: Diagnosis not present

## 2019-07-17 DIAGNOSIS — I10 Essential (primary) hypertension: Secondary | ICD-10-CM | POA: Diagnosis not present

## 2019-07-17 DIAGNOSIS — L89329 Pressure ulcer of left buttock, unspecified stage: Secondary | ICD-10-CM | POA: Diagnosis not present

## 2019-07-17 DIAGNOSIS — L89323 Pressure ulcer of left buttock, stage 3: Secondary | ICD-10-CM | POA: Insufficient documentation

## 2019-07-17 DIAGNOSIS — R32 Unspecified urinary incontinence: Secondary | ICD-10-CM | POA: Insufficient documentation

## 2019-07-17 DIAGNOSIS — R490 Dysphonia: Secondary | ICD-10-CM | POA: Diagnosis not present

## 2019-07-17 NOTE — Therapy (Signed)
[place] MAIN [facility] SERVICES [address] [place], [place], [address] Phone: [phone]   Fax:  [phone]  Speech Language Pathology Treatment  Patient Details  Name: Jacqueline [name] MRN: [id number] Date of Birth: 1936-01-01 Referring Provider (SLP): Dr. Melrose Nakayama   Encounter Date: 07/17/2019  End of Session - 07/17/19 1350    Visit Number  2    Number of Visits  17    Date for SLP Re-Evaluation  09/06/19    Authorization Type  Medicare    Authorization Time Period  Start 07/12/2019    Authorization - Visit Number  2    Authorization - Number of Visits  10    SLP Start Time  1120    SLP Stop Time   1200    SLP Time Calculation (min)  40 min    Activity Tolerance  Patient tolerated treatment well       Past Medical History:  Diagnosis Date  . Asthma   . Collapsed lung   . Diabetes mellitus without complication ([place])    Patient takes Insulin twice a day.   Marland Kitchen GERD (gastroesophageal reflux disease)   . Hyperlipidemia   . Hypertension   . Hypothyroidism   . Parkinson's disease ([place])   . Reactive airway disease     Past Surgical History:  Procedure Laterality Date  . ABDOMINAL HYSTERECTOMY    . CHOLECYSTECTOMY    . COLONOSCOPY WITH PROPOFOL N/A 03/29/2015   Procedure: COLONOSCOPY WITH PROPOFOL;  Surgeon: Hulen Luster, MD;  Location: [facility] ENDOSCOPY;  Service: Gastroenterology;  Laterality: N/A;  . ESOPHAGOGASTRODUODENOSCOPY (EGD) WITH PROPOFOL N/A 03/29/2015   Procedure: ESOPHAGOGASTRODUODENOSCOPY (EGD) WITH PROPOFOL;  Surgeon: Hulen Luster, MD;  Location: [facility] ENDOSCOPY;  Service: Gastroenterology;  Laterality: N/A;  . lung collapse     broken rib from fall punctured lung    There were no vitals filed for this visit.  Subjective Assessment - 07/17/19 1348    Subjective  Patient complained of fatigue and "slowness" but was highly engaged in the session.    Patient is accompained by:  --   Caregiver           ADULT SLP  TREATMENT - 07/17/19 0001      General Information   Behavior/Cognition  Alert;Cooperative;Pleasant mood    Oral care provided  N/A    HPI  Jacqueline [name] is an 84 year old female with a history of Parkinson's Disease. Currently has dysphonia secondary to Parkinson's.       Treatment Provided   Treatment provided  Cognitive-Linquistic      Cognitive-Linquistic Treatment   Treatment focused on  Voice    Skilled Treatment  Patient was prompted to practice sustained vowels at an increased volume and variations in pitch. Overall patient demonstrated an increase in pitch variation and volume as compared to previous session. Patient was prompted to do a simple reading task (f=50) and asked to rank her volume. Patient typically responded "medium". Patient was very stimulable for increased volume on structured speech task. Patient was asked to engage in conversation with therapist and self-monitor volume. Patient demonstrated vocal fatigue as the sesison progressed but was able to regain loudness with vowl prolongations.       Assessment / Recommendations / Plan   Plan  Continue with current plan of care      Progression Toward Goals   Progression toward goals  Progressing toward goals       SLP Education - 07/17/19 1349  Education Details  flow phonation, self-monitoring volume    Person(s) Educated  Patient;Caregiver(s)    Methods  Explanation;Demonstration    Comprehension  Verbalized understanding;Returned demonstration         SLP Long Term Goals - 07/12/19 1427      SLP LONG TERM GOAL #1   Title  The patient will minimize vocal tension via flow phonation and/or resonant voice therapy (or comparable techniques) with min SLP cues with 80% accuracy.    Time  8    Period  Weeks    Status  New    Target Date  09/06/19      SLP LONG TERM GOAL #2   Title  The patient will maximize voice quality and loudness using breath support/oral resonance for sustained vowel production, pitch  glides, and hierarchal speech drill.    Time  8    Period  Weeks    Status  New    Target Date  09/06/19      SLP LONG TERM GOAL #3   Title  The patient will maximize voice quality and loudness using breath support/oral resonance for paragraph length recitation with 80% accuracy.    Time  8    Period  Weeks    Status  New    Target Date  09/06/19       Plan - 07/17/19 1351    Clinical Impression Statement  Pateint continues to demosntrate hoarse vocal quality, reduced breath control, limited pitch range and strained/tense phonation. Patient often noted squeezing eyes shut during speech tasks, especially on pitch variation tasks. Patient shared that she is practicing flowphonation techniques at home as per recommendation from the previous session. Patient was quick to notice her volume but benefited from cues to try to speak louder. Vocal fatigue noted as the task continued, but patient was stimulable for a louder voice with reminders from clinician and flow phonation techniques.    Speech Therapy Frequency  2x / week    Duration  Other (comment)   8 weeks   Treatment/Interventions  SLP instruction and feedback;Patient/family education;Other (comment)   Voice therapy   Potential to Achieve Goals  Good    Potential Considerations  Ability to learn/carryover information;Previous level of function;Co-morbidities;Severity of impairments;Cooperation/participation level;Medical prognosis;Family/community support    SLP Home Exercise Plan  continue with flow phonation, especially /ah/ and pitch variation.       Patient will benefit from skilled therapeutic intervention in order to improve the following deficits and impairments:   Dysphonia    Problem List Patient Active Problem List   Diagnosis Date Noted  . Pressure injury of skin of sacral region 06/12/2019  . Wound healing, delayed 06/12/2019  . Encounter for general adult medical examination with abnormal findings 06/21/2018  .  Uncontrolled type 2 diabetes mellitus with hyperglycemia ([place]) 06/21/2018  . Asthma without status asthmaticus 07/08/2016  . Hyperlipidemia, unspecified 07/08/2016  . Dysphagia 01/07/2016  . Unsteadiness 10/01/2015  . Type 2 diabetes mellitus without complication, without long-term current use of insulin ([place]) 08/07/2015  . Mixed Alzheimer's and vascular dementia ([place]) 08/06/2015  . Controlled type 2 diabetes mellitus without complication, without long-term current use of insulin ([place]) 02/12/2015  . Diabetes type 2, controlled ([place]) 06/19/2014  . Essential hypertension 03/08/2014  . Parkinson's disease ([place]) 01/16/2014  . Fatigue 11/09/2013  . Diabetes mellitus, type II ([place]) 09/15/2013  . Difficulty walking 09/15/2013  . Dizziness 09/15/2013    Semir Brill 07/17/2019, 1:55 PM  [facility]  [place], [place], [address] Phone: [phone]   Fax:  [phone]   Name: Jacqueline [name] MRN: [id number] Date of Birth: 10/08/1935

## 2019-07-18 NOTE — Progress Notes (Signed)
[name], [name] ([id number]) Visit Report for 07/17/2019 Arrival Information Details Patient Name: Jacqueline [name], Jacqueline [name] Date of Service: 07/17/2019 12:30 PM Medical Record Number: [id number] Patient Account Number: [id number] Date of Birth/Sex: 11-09-1935 (84 y.o. F) Treating RN: Cornell Barman Primary Care Sueo Cullen: Clayborn Bigness Other Clinician: Referring Lameeka Schleifer: Clayborn Bigness Treating Chaya Dehaan/Extender: Melburn Hake, HOYT Weeks in Treatment: 3 Visit Information History Since Last Visit Added or deleted any medications: No Patient Arrived: Ambulatory Any new allergies or adverse reactions: No Arrival Time: 12:41 Had a fall or experienced change in No Accompanied By: caregiver activities of daily living that may affect Transfer Assistance: None risk of falls: Patient Identification Verified: Yes Signs or symptoms of abuse/neglect since last visito No Secondary Verification Process Completed: Yes Hospitalized since last visit: No Implantable device outside of the clinic excluding No cellular tissue based products placed in the center since last visit: Has Dressing in Place as Prescribed: Yes Pain Present Now: No Electronic Signature(s) Signed: 07/17/2019 4:25:10 PM By: Lorine Bears RCP, RRT, CHT Entered By: Lorine Bears on 07/17/2019 12:42:17 Jacqueline [name] ([id number]) -------------------------------------------------------------------------------- Clinic Level of Care Assessment Details Patient Name: Jacqueline [name] Date of Service: 07/17/2019 12:30 PM Medical Record Number: [id number] Patient Account Number: [id number] Date of Birth/Sex: [birth year] (84 y.o. F) Treating RN: Cornell Barman Primary Care Parmvir Boomer: Clayborn Bigness Other Clinician: Referring Alichia Alridge: Clayborn Bigness Treating Amador Braddy/Extender: Melburn Hake, HOYT Weeks in Treatment: 3 Clinic Level of Care Assessment Items TOOL 4 Quantity Score []  - Use when only an EandM is performed on FOLLOW-UP visit  0 ASSESSMENTS - Nursing Assessment / Reassessment []  - Reassessment of Co-morbidities (includes updates in patient status) 0 X- 1 5 Reassessment of Adherence to Treatment Plan ASSESSMENTS - Wound and Skin Assessment / Reassessment X - Simple Wound Assessment / Reassessment - one wound 1 5 []  - 0 Complex Wound Assessment / Reassessment - multiple wounds []  - 0 Dermatologic / Skin Assessment (not related to wound area) ASSESSMENTS - Focused Assessment []  - Circumferential Edema Measurements - multi extremities 0 []  - 0 Nutritional Assessment / Counseling / Intervention []  - 0 Lower Extremity Assessment (monofilament, tuning fork, pulses) []  - 0 Peripheral Arterial Disease Assessment (using hand held doppler) ASSESSMENTS - Ostomy and/or Continence Assessment and Care []  - Incontinence Assessment and Management 0 []  - 0 Ostomy Care Assessment and Management (repouching, etc.) PROCESS - Coordination of Care X - Simple Patient / Family Education for ongoing care 1 15 []  - 0 Complex (extensive) Patient / Family Education for ongoing care []  - 0 Staff obtains Programmer, systems, Records, Test Results / Process Orders []  - 0 Staff telephones HHA, Nursing Homes / Clarify orders / etc []  - 0 Routine Transfer to another Facility (non-emergent condition) []  - 0 Routine Hospital Admission (non-emergent condition) []  - 0 New Admissions / Biomedical engineer / Ordering NPWT, Apligraf, etc. []  - 0 Emergency Hospital Admission (emergent condition) X- 1 10 Simple Discharge Coordination Jacqueline [name], SOLANO. ([id number]) []  - 0 Complex (extensive) Discharge Coordination PROCESS - Special Needs []  - Pediatric / Minor Patient Management 0 []  - 0 Isolation Patient Management []  - 0 Hearing / Language / Visual special needs []  - 0 Assessment of Community assistance (transportation, D/C planning, etc.) []  - 0 Additional assistance / Altered mentation []  - 0 Support Surface(s) Assessment (bed,  cushion, seat, etc.) INTERVENTIONS - Wound Cleansing / Measurement []  - Simple Wound Cleansing - one wound 0 []  - 0 Complex Wound Cleansing - multiple wounds X- 1  5 Wound Imaging (photographs - any number of wounds) []  - 0 Wound Tracing (instead of photographs) []  - 0 Simple Wound Measurement - one wound []  - 0 Complex Wound Measurement - multiple wounds INTERVENTIONS - Wound Dressings []  - Small Wound Dressing one or multiple wounds 0 []  - 0 Medium Wound Dressing one or multiple wounds []  - 0 Large Wound Dressing one or multiple wounds []  - 0 Application of Medications - topical []  - 0 Application of Medications - injection INTERVENTIONS - Miscellaneous []  - External ear exam 0 []  - 0 Specimen Collection (cultures, biopsies, blood, body fluids, etc.) []  - 0 Specimen(s) / Culture(s) sent or taken to Lab for analysis []  - 0 Patient Transfer (multiple staff / Civil Service fast streamer / Similar devices) []  - 0 Simple Staple / Suture removal (25 or less) []  - 0 Complex Staple / Suture removal (26 or more) []  - 0 Hypo / Hyperglycemic Management (close monitor of Blood Glucose) []  - 0 Ankle / Brachial Index (ABI) - do not check if billed separately X- 1 5 Vital Signs Jacqueline [name], Jacqueline [name] ([id number]) Has the patient been seen at the hospital within the last three years: Yes Total Score: 45 Level Of Care: New/Established - Level 2 Electronic Signature(s) Signed: 07/18/2019 4:33:39 PM By: Gretta Cool, BSN, RN, CWS, Kim RN, BSN Entered By: Gretta Cool, BSN, RN, CWS, Kim on 07/17/2019 12:51:56 Jacqueline [name] ([id number]) -------------------------------------------------------------------------------- Encounter Discharge Information Details Patient Name: Jacqueline [name] Date of Service: 07/17/2019 12:30 PM Medical Record Number: [id number] Patient Account Number: [id number] Date of Birth/Sex: 07-30-1935 (83 y.o. F) Treating RN: Cornell Barman Primary Care Lori Liew: Clayborn Bigness Other Clinician: Referring  Audrie Kuri: Clayborn Bigness Treating Keyandre Pileggi/Extender: Melburn Hake, HOYT Weeks in Treatment: 3 Encounter Discharge Information Items Discharge Condition: Stable Ambulatory Status: Ambulatory Discharge Destination: Home Transportation: Private Auto Accompanied By: caregiver Schedule Follow-up Appointment: Yes Clinical Summary of Care: Electronic Signature(s) Signed: 07/18/2019 4:33:39 PM By: Gretta Cool, BSN, RN, CWS, Kim RN, BSN Entered By: Gretta Cool, BSN, RN, CWS, Kim on 07/17/2019 12:52:33 Jacqueline [name] ([id number]) -------------------------------------------------------------------------------- Lower Extremity Assessment Details Patient Name: Jacqueline [name] Date of Service: 07/17/2019 12:30 PM Medical Record Number: [id number] Patient Account Number: [id number] Date of Birth/Sex: 11-Dec-1935 (83 y.o. F) Treating RN: Army Melia Primary Care Reagen Goates: Clayborn Bigness Other Clinician: Referring Hideko Esselman: Clayborn Bigness Treating Williamson Cavanah/Extender: Melburn Hake, HOYT Weeks in Treatment: 3 Electronic Signature(s) Signed: 07/17/2019 1:06:11 PM By: Army Melia Entered By: Army Melia on 07/17/2019 12:45:19 Jacqueline [name] ([id number]) -------------------------------------------------------------------------------- Multi Wound Chart Details Patient Name: Jacqueline [name] Date of Service: 07/17/2019 12:30 PM Medical Record Number: [id number] Patient Account Number: [id number] Date of Birth/Sex: Apr 14, 1936 (84 y.o. F) Treating RN: Cornell Barman Primary Care Nyara Capell: Clayborn Bigness Other Clinician: Referring Adaleena Mooers: Clayborn Bigness Treating Merla Sawka/Extender: Melburn Hake, HOYT Weeks in Treatment: 3 Vital Signs Height(in): 62 Pulse(bpm): 89 Weight(lbs): 104 Blood Pressure(mmHg): 119/61 Body Mass Index(BMI): 19 Temperature(F): 98.5 Respiratory Rate 16 (breaths/min): Photos: [N/A:N/A] Wound Location: Left, Proximal Gluteus Left, Distal Gluteus N/A Wounding Event: Pressure Injury Pressure Injury N/A Primary  Etiology: Pressure Ulcer Pressure Ulcer N/A Comorbid History: Hypertension, Type II Hypertension, Type II N/A Diabetes, Osteoarthritis Diabetes, Osteoarthritis Date Acquired: 05/26/2019 05/26/2019 N/A Weeks of Treatment: 3 3 N/A Wound Status: Healed - Epithelialized Healed - Epithelialized N/A Measurements L x W x D 0x0x0 0x0x0 N/A (cm) Area (cm) : 0 0 N/A Volume (cm) : 0 0 N/A % Reduction in Area: 100.00% 100.00% N/A % Reduction in Volume: 100.00% 100.00% N/A Classification: Category/Stage  III Category/Stage III N/A Exudate Amount: None Present None Present N/A Wound Margin: Flat and Intact Flat and Intact N/A Granulation Amount: None Present (0%) None Present (0%) N/A Necrotic Amount: None Present (0%) None Present (0%) N/A Exposed Structures: Fascia: No Fascia: No N/A Fat Layer (Subcutaneous Fat Layer (Subcutaneous Tissue) Exposed: No Tissue) Exposed: No Tendon: No Tendon: No Muscle: No Muscle: No Joint: No Joint: No Bone: No Bone: No Epithelialization: Large (67-100%) Large (67-100%) N/A Treatment Notes Jacqueline [name], Jacqueline [name] ([id number]) Electronic Signature(s) Signed: 07/18/2019 4:33:39 PM By: Gretta Cool, BSN, RN, CWS, Kim RN, BSN Entered By: Gretta Cool, BSN, RN, CWS, Kim on 07/17/2019 12:50:58 Jacqueline [name] ([id number]) -------------------------------------------------------------------------------- [place] Details Patient Name: Jacqueline [name] Date of Service: 07/17/2019 12:30 PM Medical Record Number: [id number] Patient Account Number: [id number] Date of Birth/Sex: Aug 12, 1935 (83 y.o. F) Treating RN: Cornell Barman Primary Care Cornie Mccomber: Clayborn Bigness Other Clinician: Referring Orbie Grupe: Clayborn Bigness Treating Karren Newland/Extender: Sharalyn Ink in Treatment: 3 Active Inactive Electronic Signature(s) Signed: 07/18/2019 4:33:39 PM By: Gretta Cool, BSN, RN, CWS, Kim RN, BSN Entered By: Gretta Cool, BSN, RN, CWS, Kim on 07/17/2019 12:50:36 Jacqueline [name]  ([id number]) -------------------------------------------------------------------------------- Pain Assessment Details Patient Name: Jacqueline [name] Date of Service: 07/17/2019 12:30 PM Medical Record Number: [id number] Patient Account Number: [id number] Date of Birth/Sex: March 25, 1936 (83 y.o. F) Treating RN: Cornell Barman Primary Care Carolene Gitto: Clayborn Bigness Other Clinician: Referring Vanya Carberry: Clayborn Bigness Treating Annisten [place]/Extender: Melburn Hake, HOYT Weeks in Treatment: 3 Active Problems Location of Pain Severity and Description of Pain Patient Has Paino No Site Locations Pain Management and Medication Current Pain Management: Electronic Signature(s) Signed: 07/17/2019 4:25:10 PM By: Lorine Bears RCP, RRT, CHT Signed: 07/18/2019 4:33:39 PM By: Gretta Cool, BSN, RN, CWS, Kim RN, BSN Entered By: Lorine Bears on 07/17/2019 12:42:30 Jacqueline [name] ([id number]) -------------------------------------------------------------------------------- Patient/Caregiver Education Details Patient Name: Jacqueline [name] Date of Service: 07/17/2019 12:30 PM Medical Record Number: [id number] Patient Account Number: [id number] Date of Birth/Gender: December 25, 1935 (83 y.o. F) Treating RN: Cornell Barman Primary Care Physician: Clayborn Bigness Other Clinician: Referring Physician: Clayborn Bigness Treating Physician/Extender: Sharalyn Ink in Treatment: 3 Education Assessment Education Provided To: Patient Education Topics Provided Wound/Skin Impairment: Handouts: Caring for Your Ulcer Methods: Demonstration, Explain/Verbal Responses: State content correctly Electronic Signature(s) Signed: 07/18/2019 4:33:39 PM By: Gretta Cool, BSN, RN, CWS, Kim RN, BSN Entered By: Gretta Cool, BSN, RN, CWS, Kim on 07/17/2019 12:52:15 Jacqueline [name] ([id number]) -------------------------------------------------------------------------------- Wound Assessment Details Patient Name: Jacqueline [name] Date of Service:  07/17/2019 12:30 PM Medical Record Number: [id number] Patient Account Number: [id number] Date of Birth/Sex: 1935/09/22 (83 y.o. F) Treating RN: Cornell Barman Primary Care Dyllan Hughett: Clayborn Bigness Other Clinician: Referring Nason Conradt: Clayborn Bigness Treating Rhesa Forsberg/Extender: Melburn Hake, HOYT Weeks in Treatment: 3 Wound Status Wound Number: 1 Primary Etiology: Pressure Ulcer Wound Location: Left, Proximal Gluteus Wound Status: Healed - Epithelialized Wounding Event: Pressure Injury Comorbid Hypertension, Type II Diabetes, History: Osteoarthritis Date Acquired: 05/26/2019 Weeks Of Treatment: 3 Clustered Wound: No Photos Wound Measurements Length: (cm) 0 Width: (cm) 0 Depth: (cm) 0 Area: (cm) 0 Volume: (cm) 0 % Reduction in Area: 100% % Reduction in Volume: 100% Epithelialization: Large (67-100%) Wound Description Classification: Category/Stage III Foul O Wound Margin: Flat and Intact Slough Exudate Amount: None Present dor After Cleansing: No /Fibrino Yes Wound Bed Granulation Amount: None Present (0%) Exposed Structure Necrotic Amount: None Present (0%) Fascia Exposed: No Fat Layer (Subcutaneous Tissue) Exposed: No Tendon Exposed: No Muscle Exposed: No Joint Exposed: No Bone  Exposed: No Electronic Signature(s) Signed: 07/18/2019 4:33:39 PM By: Gretta Cool, BSN, RN, CWS, Kim RN, BSN Entered By: Gretta Cool, BSN, RN, CWS, Kim on 07/17/2019 12:50:01 Jacqueline [name], Jacqueline [name] ([id number]) Jacqueline [name], Jacqueline [name] ([id number]) -------------------------------------------------------------------------------- Wound Assessment Details Patient Name: Jacqueline [name] Date of Service: 07/17/2019 12:30 PM Medical Record Number: [id number] Patient Account Number: [id number] Date of Birth/Sex: 1936-01-05 (83 y.o. F) Treating RN: Cornell Barman Primary Care Rodrecus Belsky: Clayborn Bigness Other Clinician: Referring Anastasia Tompson: Clayborn Bigness Treating Jahliyah Trice/Extender: Melburn Hake, HOYT Weeks in Treatment: 3 Wound Status Wound Number:  2 Primary Etiology: Pressure Ulcer Wound Location: Left, Distal Gluteus Wound Status: Healed - Epithelialized Wounding Event: Pressure Injury Comorbid Hypertension, Type II Diabetes, History: Osteoarthritis Date Acquired: 05/26/2019 Weeks Of Treatment: 3 Clustered Wound: No Photos Wound Measurements Length: (cm) 0 % Redu Width: (cm) 0 % Redu Depth: (cm) 0 Epithe Area: (cm) 0 [place] Volume: (cm) 0 Under ction in Area: 100% ction in Volume: 100% lialization: Large (67-100%) ling: No mining: No Wound Description Classification: Category/Stage III Foul O Wound Margin: Flat and Intact Slough Exudate Amount: None Present dor After Cleansing: No /Fibrino Yes Wound Bed Granulation Amount: None Present (0%) Exposed Structure Necrotic Amount: None Present (0%) Fascia Exposed: No Fat Layer (Subcutaneous Tissue) Exposed: No Tendon Exposed: No Muscle Exposed: No Joint Exposed: No Bone Exposed: No Electronic Signature(s) Signed: 07/18/2019 4:33:39 PM By: Gretta Cool, BSN, RN, CWS, Kim RN, BSN Entered By: Gretta Cool, BSN, RN, CWS, Kim on 07/17/2019 12:50:06 Jacqueline [name] ([id number]) Jacqueline [name], Jacqueline [name] ([id number]) -------------------------------------------------------------------------------- [place] Details Patient Name: Jacqueline [name] Date of Service: 07/17/2019 12:30 PM Medical Record Number: [id number] Patient Account Number: [id number] Date of Birth/Sex: [birth year] (83 y.o. F) Treating RN: Cornell Barman Primary Care Alaura Schippers: Clayborn Bigness Other Clinician: Referring Jacqueline [name]: Clayborn Bigness Treating Lidya Mccalister/Extender: Melburn Hake, HOYT Weeks in Treatment: 3 Vital Signs Time Taken: 12:40 Temperature (F): 98.5 Height (in): 62 Pulse (bpm): 89 Weight (lbs): 104 Respiratory Rate (breaths/min): 16 Body Mass Index (BMI): 19 Blood Pressure (mmHg): 119/61 Reference Range: 80 - 120 mg / dl Electronic Signature(s) Signed: 07/17/2019 4:25:10 PM By: Lorine Bears RCP, RRT,  CHT Entered By: Lorine Bears on 07/17/2019 12:43:06

## 2019-07-18 NOTE — Progress Notes (Signed)
Jacqueline [name], Jacqueline [name] ([id number]) Visit Report for 07/17/2019 Chief Complaint Document Details Patient Name: Jacqueline [name]. Date of Service: 07/17/2019 12:30 PM Medical Record Number: [id number] Patient Account Number: [id number] Date of Birth/Sex: Oct 22, 1935 (84 y.o. F) Treating RN: Cornell Barman Primary Care Provider: Clayborn Bigness Other Clinician: Referring Provider: Clayborn Bigness Treating Provider/Extender: Melburn Hake, Elmin Wiederholt Weeks in Treatment: 3 Information Obtained from: Patient Chief Complaint Left buttock pressure ulcer Electronic Signature(s) Signed: 07/17/2019 12:53:28 PM By: Worthy Keeler PA-C Entered By: Worthy Keeler on 07/17/2019 12:53:27 Jacqueline [name] ([id number]) -------------------------------------------------------------------------------- HPI Details Patient Name: Jacqueline [name] Date of Service: 07/17/2019 12:30 PM Medical Record Number: [id number] Patient Account Number: [id number] Date of Birth/Sex: 02/26/1936 (83 y.o. F) Treating RN: Cornell Barman Primary Care Provider: Clayborn Bigness Other Clinician: Referring Provider: Clayborn Bigness Treating Provider/Extender: Melburn Hake, Kiyoto Slomski Weeks in Treatment: 3 History of Present Illness HPI Description: 06/26/2019 on evaluation today patient appears to be doing decently well with regard to her left gluteal wounds upon initial inspection today here in the clinic. Fortunately there is no evidence of active infection at this point. She has been attempting to manage this for a little over a month on her own with the help of her caregiver she has been using Neosporin on the area. Unfortunately there really has not been any significant improvement during that time. She does have Parkinson's disease and does have a lot of spasming which causes her to have jerking movements and her caregiver believes the way she sits that she is probably rubbing and causing issues in this area as well which is likely what contributed to the formation of the wounds.  She is previously had when they were able to heal without having to come into the clinic. Unfortunately this is just not doing nearly as well. The patient does have a history of diabetes mellitus type 1 type II with a hemoglobin A1c of 7.7 on last check which was 04/26/2019.She also has a history of hypertension as well as Parkinson's disease as mentioned above and urinary incontinence. 07/03/2019 upon evaluation today patient appears to be doing very well in regard to her wounds. She has been practicing offloading it very well she spent more time in the bed which is good news. This obviously is done very well for her and she seems to be progressing quite nicely. The wounds in fact are measuring about half the size they were last week. 07/17/2019 upon evaluation today patient appears to be doing excellent in regard to her wound. In fact this appears to be completely healed based on what we are seeing currently. She has been tolerating the dressing changes without complication and overall things are doing quite well. There is no signs of active infection at this time. No fevers, chills, nausea, vomiting, or diarrhea. Electronic Signature(s) Signed: 07/17/2019 12:53:52 PM By: Worthy Keeler PA-C Entered By: Worthy Keeler on 07/17/2019 12:53:51 Jacqueline [name] ([id number]) -------------------------------------------------------------------------------- Physical Exam Details Patient Name: Jacqueline [name] Date of Service: 07/17/2019 12:30 PM Medical Record Number: [id number] Patient Account Number: [id number] Date of Birth/Sex: 1936-04-18 (84 y.o. F) Treating RN: Cornell Barman Primary Care Provider: Clayborn Bigness Other Clinician: Referring Provider: Clayborn Bigness Treating Provider/Extender: Melburn Hake, Flora Parks Weeks in Treatment: 3 Constitutional Well-nourished and well-hydrated in no acute distress. Respiratory normal breathing without difficulty. Psychiatric this patient is able to make decisions and  demonstrates good insight into disease process. Alert and Oriented x 3. pleasant and cooperative. Notes Patient's wound bed showed  signs of complete epithelization I am extremely pleased with how things appear at this point this is excellent news. Electronic Signature(s) Signed: 07/17/2019 12:54:09 PM By: Worthy Keeler PA-C Entered By: Worthy Keeler on 07/17/2019 12:54:08 Jacqueline [name] ([id number]) -------------------------------------------------------------------------------- Physician Orders Details Patient Name: Jacqueline [name] Date of Service: 07/17/2019 12:30 PM Medical Record Number: [id number] Patient Account Number: [id number] Date of Birth/Sex: 25-Mar-1936 (83 y.o. F) Treating RN: Cornell Barman Primary Care Provider: Clayborn Bigness Other Clinician: Referring Provider: Clayborn Bigness Treating Provider/Extender: Sharalyn Ink in Treatment: 3 Verbal / Phone Orders: No Diagnosis Coding Discharge From [facility] o Discharge from [place] - treatment complete Electronic Signature(s) Signed: 07/17/2019 5:41:25 PM By: Worthy Keeler PA-C Signed: 07/18/2019 4:33:39 PM By: Gretta Cool, BSN, RN, CWS, Kim RN, BSN Entered By: Gretta Cool, BSN, RN, CWS, Kim on 07/17/2019 12:51:32 Jacqueline [name] ([id number]) -------------------------------------------------------------------------------- Problem List Details Patient Name: Jacqueline [name] Date of Service: 07/17/2019 12:30 PM Medical Record Number: [id number] Patient Account Number: [id number] Date of Birth/Sex: August 27, 1935 (83 y.o. F) Treating RN: Cornell Barman Primary Care Provider: Clayborn Bigness Other Clinician: Referring Provider: Clayborn Bigness Treating Provider/Extender: Melburn Hake, Mixtli Reno Weeks in Treatment: 3 Active Problems ICD-10 Evaluated Encounter [name] Description Active Date Today Diagnosis L89.323 Pressure ulcer of left buttock, stage 3 06/26/2019 No Yes E11.622 Type 2 diabetes mellitus with other skin ulcer 06/26/2019 No Yes G20  Parkinson's disease 06/26/2019 No Yes I10 Essential (primary) hypertension 06/26/2019 No Yes N39.498 Other specified urinary incontinence 06/26/2019 No Yes Inactive Problems Resolved Problems Electronic Signature(s) Signed: 07/17/2019 12:53:20 PM By: Worthy Keeler PA-C Entered By: Worthy Keeler on 07/17/2019 12:53:20 Jacqueline [name] ([id number]) -------------------------------------------------------------------------------- Progress Note Details Patient Name: Jacqueline [name] Date of Service: 07/17/2019 12:30 PM Medical Record Number: [id number] Patient Account Number: [id number] Date of Birth/Sex: 04/22/1936 (83 y.o. F) Treating RN: Cornell Barman Primary Care Provider: Clayborn Bigness Other Clinician: Referring Provider: Clayborn Bigness Treating Provider/Extender: Melburn Hake, Marlys Stegmaier Weeks in Treatment: 3 Subjective Chief Complaint Information obtained from Patient Left buttock pressure ulcer History of Present Illness (HPI) 06/26/2019 on evaluation today patient appears to be doing decently well with regard to her left gluteal wounds upon initial inspection today here in the clinic. Fortunately there is no evidence of active infection at this point. She has been attempting to manage this for a little over a month on her own with the help of her caregiver she has been using Neosporin on the area. Unfortunately there really has not been any significant improvement during that time. She does have Parkinson's disease and does have a lot of spasming which causes her to have jerking movements and her caregiver believes the way she sits that she is probably rubbing and causing issues in this area as well which is likely what contributed to the formation of the wounds. She is previously had when they were able to heal without having to come into the clinic. Unfortunately this is just not doing nearly as well. The patient does have a history of diabetes mellitus type 1 type II with a hemoglobin A1c of 7.7 on  last check which was 04/26/2019.She also has a history of hypertension as well as Parkinson's disease as mentioned above and urinary incontinence. 07/03/2019 upon evaluation today patient appears to be doing very well in regard to her wounds. She has been practicing offloading it very well she spent more time in the bed which is good news. This obviously is done very well  for her and she seems to be progressing quite nicely. The wounds in fact are measuring about half the size they were last week. 07/17/2019 upon evaluation today patient appears to be doing excellent in regard to her wound. In fact this appears to be completely healed based on what we are seeing currently. She has been tolerating the dressing changes without complication and overall things are doing quite well. There is no signs of active infection at this time. No fevers, chills, nausea, vomiting, or diarrhea. Objective Constitutional Well-nourished and well-hydrated in no acute distress. Vitals Time Taken: 12:40 PM, Height: 62 in, Weight: 104 lbs, BMI: 19, Temperature: 98.5 F, Pulse: 89 bpm, Respiratory Rate: 16 breaths/min, Blood Pressure: 119/61 mmHg. Respiratory normal breathing without difficulty. Psychiatric Jacqueline [name], Jacqueline [name] ([id number]) this patient is able to make decisions and demonstrates good insight into disease process. Alert and Oriented x 3. pleasant and cooperative. General Notes: Patient's wound bed showed signs of complete epithelization I am extremely pleased with how things appear at this point this is excellent news. Integumentary (Hair, Skin) Wound #1 status is Healed - Epithelialized. Original cause of wound was Pressure Injury. The wound is located on the Left,Proximal Gluteus. The wound measures 0cm length x 0cm width x 0cm depth; 0cm^2 area and 0cm^3 volume. There is a none present amount of drainage noted. The wound margin is flat and intact. There is no granulation within the wound bed. There is  no necrotic tissue within the wound bed. Wound #2 status is Healed - Epithelialized. Original cause of wound was Pressure Injury. The wound is located on the Left,Distal Gluteus. The wound measures 0cm length x 0cm width x 0cm depth; 0cm^2 area and 0cm^3 volume. There is no tunneling or undermining noted. There is a none present amount of drainage noted. The wound margin is flat and intact. There is no granulation within the wound bed. There is no necrotic tissue within the wound bed. Assessment Active Problems ICD-10 Pressure ulcer of left buttock, stage 3 Type 2 diabetes mellitus with other skin ulcer Parkinson's disease Essential (primary) hypertension Other specified urinary incontinence Plan Discharge From [facility]: Discharge from [place] - treatment complete 1. I would recommend that we discontinue wound care services currently and the patient is in agreement with plan. 2. I am also going to suggest that she continue to monitor for any signs of reopening her aide can help with this she seems to do a great job. 3. I am also going to suggest that the patient contact us if anything changes or worsens in the meantime. We will see the patient back for follow-up visit as needed if anything changes or worsens. Electronic Signature(s) Signed: 07/17/2019 12:54:50 PM By: Worthy Keeler PA-C Entered By: Worthy Keeler on 07/17/2019 12:54:50 Jacqueline [name] ([id number]) Jacqueline [name], Jacqueline [name] ([id number]) -------------------------------------------------------------------------------- SuperBill Details Patient Name: Jacqueline [name] Date of Service: 07/17/2019 Medical Record Number: [id number] Patient Account Number: [id number] Date of Birth/Sex: February 11, 1936 (83 y.o. F) Treating RN: Cornell Barman Primary Care Provider: Clayborn Bigness Other Clinician: Referring Provider: Clayborn Bigness Treating Provider/Extender: Melburn Hake, Margarito Dehaas Weeks in Treatment: 3 Diagnosis Coding ICD-10 Codes [name]  Description [phone] Pressure ulcer of left buttock, stage 3 E11.622 Type 2 diabetes mellitus with other skin ulcer G20 Parkinson's disease I10 Essential (primary) hypertension N39.498 Other specified urinary incontinence Facility Procedures CPT4 [name]: [id number] Description: [id number] - WOUND CARE VISIT-LEV 2 EST PT Modifier: Quantity: 1 Physician Procedures CPT4 [name]: [id number] Description: [id number] - WC PHYS  LEVEL 3 - EST PT ICD-10 Diagnosis Description L89.323 Pressure ulcer of left buttock, stage 3 E11.622 Type 2 diabetes mellitus with other skin ulcer G20 Parkinson's disease I10 Essential (primary) hypertension Modifier: Quantity: 1 Electronic Signature(s) Signed: 07/17/2019 12:55:10 PM By: Worthy Keeler PA-C Entered By: Worthy Keeler on 07/17/2019 12:55:09

## 2019-07-19 ENCOUNTER — Encounter: Payer: Self-pay | Admitting: Speech Pathology

## 2019-07-19 ENCOUNTER — Ambulatory Visit: Payer: Medicare HMO | Admitting: Speech Pathology

## 2019-07-19 ENCOUNTER — Other Ambulatory Visit: Payer: Self-pay

## 2019-07-19 DIAGNOSIS — R490 Dysphonia: Secondary | ICD-10-CM | POA: Diagnosis not present

## 2019-07-19 NOTE — Therapy (Signed)
[place] MAIN [facility] SERVICES [address] [place], [place], [address] Phone: [phone]   Fax:  [phone]  Speech Language Pathology Treatment  Patient Details  Name: Jacqueline [name] MRN: [id number] Date of Birth: 1935/06/14 Referring Provider (SLP): Dr. Melrose Nakayama   Encounter Date: 07/19/2019  End of Session - 07/19/19 1232    Visit Number  3    Number of Visits  17    Date for SLP Re-Evaluation  09/06/19    Authorization Type  Medicare    Authorization Time Period  Start 07/12/2019    Authorization - Visit Number  3    Authorization - Number of Visits  10    SLP Start Time  1100    SLP Stop Time   1150    SLP Time Calculation (min)  50 min    Activity Tolerance  Patient tolerated treatment well       Past Medical History:  Diagnosis Date  . Asthma   . Collapsed lung   . Diabetes mellitus without complication ([place])    Patient takes Insulin twice a day.   Marland Kitchen GERD (gastroesophageal reflux disease)   . Hyperlipidemia   . Hypertension   . Hypothyroidism   . Parkinson's disease ([place])   . Reactive airway disease     Past Surgical History:  Procedure Laterality Date  . ABDOMINAL HYSTERECTOMY    . CHOLECYSTECTOMY    . COLONOSCOPY WITH PROPOFOL N/A 03/29/2015   Procedure: COLONOSCOPY WITH PROPOFOL;  Surgeon: Hulen Luster, MD;  Location: [facility] ENDOSCOPY;  Service: Gastroenterology;  Laterality: N/A;  . ESOPHAGOGASTRODUODENOSCOPY (EGD) WITH PROPOFOL N/A 03/29/2015   Procedure: ESOPHAGOGASTRODUODENOSCOPY (EGD) WITH PROPOFOL;  Surgeon: Hulen Luster, MD;  Location: [facility] ENDOSCOPY;  Service: Gastroenterology;  Laterality: N/A;  . lung collapse     broken rib from fall punctured lung    There were no vitals filed for this visit.  Subjective Assessment - 07/19/19 1230    Subjective  Patient said today she is "much better".    Currently in Pain?  No/denies            ADULT SLP TREATMENT - 07/19/19 0001      General Information   Behavior/Cognition  Alert;Cooperative;Pleasant mood    HPI  Jacqueline [name] is an 84 year old female with a history of Parkinson's Disease. Currently has dysphonia secondary to Parkinson's.       Treatment Provided   Treatment provided  Cognitive-Linquistic      Cognitive-Linquistic Treatment   Treatment focused on  Voice    Skilled Treatment  Patient was prompted to practice sustained vowels at an increased volume and variations in pitch. Overall patient demonstrated an increase in pitch variation and volume as compared to previous session. Patient was prompted to do a simple reading task (f=100) and asked to rank her volume. Patient typically responded "medium", and when prompted to increase her volume she was able to do so. Patient was very stimulable for increased volume on structured speech task. Patient was asked to engage in conversation with therapist and self-monitor volume. Patient demonstrated vocal fatigue as the sesison progressed but was able to regain loudness with vowel prolongations.  Noted hoarseness and vocal strain as session progressed.       Assessment / Recommendations / Plan   Plan  Continue with current plan of care      Progression Toward Goals   Progression toward goals  Progressing toward goals       SLP  Education - 07/19/19 1230    Education Details  self-monitoring volume, loud vowel and effortful pitch    Person(s) Educated  Patient;Caregiver(s)    Methods  Explanation;Demonstration    Comprehension  Verbalized understanding;Returned demonstration         SLP Long Term Goals - 07/12/19 1427      SLP LONG TERM GOAL #1   Title  The patient will minimize vocal tension via flow phonation and/or resonant voice therapy (or comparable techniques) with min SLP cues with 80% accuracy.    Time  8    Period  Weeks    Status  New    Target Date  09/06/19      SLP LONG TERM GOAL #2   Title  The patient will maximize voice quality and loudness using breath  support/oral resonance for sustained vowel production, pitch glides, and hierarchal speech drill.    Time  8    Period  Weeks    Status  New    Target Date  09/06/19      SLP LONG TERM GOAL #3   Title  The patient will maximize voice quality and loudness using breath support/oral resonance for paragraph length recitation with 80% accuracy.    Time  8    Period  Weeks    Status  New    Target Date  09/06/19       Plan - 07/19/19 1237    Clinical Impression Statement  Pateint continues to demonstrate hoarse vocal quality, reduced breath control, limited pitch range and strained/tense phonation. Patient often noted squeezing eyes shut during speech tasks, especially on pitch variation tasks. Patient shared that she is practicing flow phonation techniques at home as per recommendation from the previous session. Patient noted to produce a generally consistent increased length of /a/ today. Patient and caregiver shared that they are spending time outside of sessions practicing loud voice, and counseled on strategies for improving social communication.   Speech Therapy Frequency  2x / week    Duration  Other (comment)   8 weeks   Treatment/Interventions  SLP instruction and feedback;Patient/family education;Other (comment)   Voice therapy   Potential to Achieve Goals  Good    Potential Considerations  Ability to learn/carryover information;Previous level of function;Co-morbidities;Severity of impairments;Cooperation/participation level;Medical prognosis;Family/community support    SLP Home Exercise Plan  continue with flow phonation, especially /ah/ and pitch variation.       Patient will benefit from skilled therapeutic intervention in order to improve the following deficits and impairments:   Dysphonia    Problem List Patient Active Problem List   Diagnosis Date Noted  . Pressure injury of skin of sacral region 06/12/2019  . Wound healing, delayed 06/12/2019  . Encounter for general  adult medical examination with abnormal findings 06/21/2018  . Uncontrolled type 2 diabetes mellitus with hyperglycemia ([place]) 06/21/2018  . Asthma without status asthmaticus 07/08/2016  . Hyperlipidemia, unspecified 07/08/2016  . Dysphagia 01/07/2016  . Unsteadiness 10/01/2015  . Type 2 diabetes mellitus without complication, without long-term current use of insulin ([place]) 08/07/2015  . Mixed Alzheimer's and vascular dementia ([place]) 08/06/2015  . Controlled type 2 diabetes mellitus without complication, without long-term current use of insulin ([place]) 02/12/2015  . Diabetes type 2, controlled ([place]) 06/19/2014  . Essential hypertension 03/08/2014  . Parkinson's disease ([place]) 01/16/2014  . Fatigue 11/09/2013  . Diabetes mellitus, type II ([place]) 09/15/2013  . Difficulty walking 09/15/2013  . Dizziness 09/15/2013    Elizar Alpern 07/19/2019, 12:38  PM  [place] MAIN [facility] SERVICES [address] [place], [place], [address] Phone: [phone]   Fax:  [phone]   Name: Jacqueline [name] MRN: [id number] Date of Birth: 03-14-1936

## 2019-07-24 ENCOUNTER — Ambulatory Visit: Payer: Medicare HMO | Admitting: Speech Pathology

## 2019-07-26 ENCOUNTER — Ambulatory Visit: Payer: Medicare HMO | Admitting: Speech Pathology

## 2019-07-29 ENCOUNTER — Emergency Department: Payer: Medicare HMO

## 2019-07-29 ENCOUNTER — Encounter: Payer: Self-pay | Admitting: Emergency Medicine

## 2019-07-29 ENCOUNTER — Inpatient Hospital Stay
Admission: EM | Admit: 2019-07-29 | Discharge: 2019-08-03 | DRG: 481 | Disposition: A | Discharge status: Home Health | Payer: Medicare HMO | Attending: Internal Medicine | Admitting: Internal Medicine

## 2019-07-29 ENCOUNTER — Other Ambulatory Visit: Payer: Self-pay

## 2019-07-29 DIAGNOSIS — Z419 Encounter for procedure for purposes other than remedying health state, unspecified: Secondary | ICD-10-CM

## 2019-07-29 DIAGNOSIS — Z7951 Long term (current) use of inhaled steroids: Secondary | ICD-10-CM

## 2019-07-29 DIAGNOSIS — Z7189 Other specified counseling: Secondary | ICD-10-CM

## 2019-07-29 DIAGNOSIS — N1831 Chronic kidney disease, stage 3a: Secondary | ICD-10-CM | POA: Diagnosis not present

## 2019-07-29 DIAGNOSIS — S199XXA Unspecified injury of neck, initial encounter: Secondary | ICD-10-CM | POA: Diagnosis not present

## 2019-07-29 DIAGNOSIS — S0083XA Contusion of other part of head, initial encounter: Secondary | ICD-10-CM | POA: Diagnosis present

## 2019-07-29 DIAGNOSIS — Y92009 Unspecified place in unspecified non-institutional (private) residence as the place of occurrence of the external cause: Secondary | ICD-10-CM | POA: Diagnosis not present

## 2019-07-29 DIAGNOSIS — E785 Hyperlipidemia, unspecified: Secondary | ICD-10-CM | POA: Diagnosis present

## 2019-07-29 DIAGNOSIS — R296 Repeated falls: Secondary | ICD-10-CM | POA: Diagnosis present

## 2019-07-29 DIAGNOSIS — Z03818 Encounter for observation for suspected exposure to other biological agents ruled out: Secondary | ICD-10-CM | POA: Diagnosis not present

## 2019-07-29 DIAGNOSIS — N183 Chronic kidney disease, stage 3 unspecified: Secondary | ICD-10-CM | POA: Diagnosis not present

## 2019-07-29 DIAGNOSIS — E440 Moderate protein-calorie malnutrition: Secondary | ICD-10-CM | POA: Diagnosis present

## 2019-07-29 DIAGNOSIS — K219 Gastro-esophageal reflux disease without esophagitis: Secondary | ICD-10-CM | POA: Diagnosis not present

## 2019-07-29 DIAGNOSIS — R55 Syncope and collapse: Secondary | ICD-10-CM

## 2019-07-29 DIAGNOSIS — S0990XA Unspecified injury of head, initial encounter: Secondary | ICD-10-CM | POA: Diagnosis not present

## 2019-07-29 DIAGNOSIS — S72012D Unspecified intracapsular fracture of left femur, subsequent encounter for closed fracture with routine healing: Secondary | ICD-10-CM | POA: Diagnosis not present

## 2019-07-29 DIAGNOSIS — G20A1 Parkinson's disease without dyskinesia, without mention of fluctuations: Secondary | ICD-10-CM | POA: Diagnosis present

## 2019-07-29 DIAGNOSIS — Z20822 Contact with and (suspected) exposure to covid-19: Secondary | ICD-10-CM | POA: Diagnosis not present

## 2019-07-29 DIAGNOSIS — Z79890 Hormone replacement therapy (postmenopausal): Secondary | ICD-10-CM

## 2019-07-29 DIAGNOSIS — R0989 Other specified symptoms and signs involving the circulatory and respiratory systems: Secondary | ICD-10-CM

## 2019-07-29 DIAGNOSIS — J45909 Unspecified asthma, uncomplicated: Secondary | ICD-10-CM | POA: Diagnosis present

## 2019-07-29 DIAGNOSIS — Z79899 Other long term (current) drug therapy: Secondary | ICD-10-CM | POA: Diagnosis not present

## 2019-07-29 DIAGNOSIS — L8990 Pressure ulcer of unspecified site, unspecified stage: Secondary | ICD-10-CM | POA: Insufficient documentation

## 2019-07-29 DIAGNOSIS — R262 Difficulty in walking, not elsewhere classified: Secondary | ICD-10-CM | POA: Diagnosis not present

## 2019-07-29 DIAGNOSIS — W19XXXA Unspecified fall, initial encounter: Secondary | ICD-10-CM | POA: Diagnosis not present

## 2019-07-29 DIAGNOSIS — Z681 Body mass index (BMI) 19 or less, adult: Secondary | ICD-10-CM

## 2019-07-29 DIAGNOSIS — I129 Hypertensive chronic kidney disease with stage 1 through stage 4 chronic kidney disease, or unspecified chronic kidney disease: Secondary | ICD-10-CM | POA: Diagnosis present

## 2019-07-29 DIAGNOSIS — I10 Essential (primary) hypertension: Secondary | ICD-10-CM | POA: Diagnosis present

## 2019-07-29 DIAGNOSIS — S72002A Fracture of unspecified part of neck of left femur, initial encounter for closed fracture: Secondary | ICD-10-CM | POA: Diagnosis not present

## 2019-07-29 DIAGNOSIS — F0280 Dementia in other diseases classified elsewhere without behavioral disturbance: Secondary | ICD-10-CM | POA: Diagnosis present

## 2019-07-29 DIAGNOSIS — W010XXA Fall on same level from slipping, tripping and stumbling without subsequent striking against object, initial encounter: Secondary | ICD-10-CM | POA: Diagnosis not present

## 2019-07-29 DIAGNOSIS — L89152 Pressure ulcer of sacral region, stage 2: Secondary | ICD-10-CM | POA: Diagnosis present

## 2019-07-29 DIAGNOSIS — E1122 Type 2 diabetes mellitus with diabetic chronic kidney disease: Secondary | ICD-10-CM | POA: Diagnosis not present

## 2019-07-29 DIAGNOSIS — G20 Parkinson's disease: Secondary | ICD-10-CM | POA: Diagnosis present

## 2019-07-29 DIAGNOSIS — R69 Illness, unspecified: Secondary | ICD-10-CM | POA: Diagnosis not present

## 2019-07-29 DIAGNOSIS — Z794 Long term (current) use of insulin: Secondary | ICD-10-CM | POA: Diagnosis not present

## 2019-07-29 DIAGNOSIS — S72012A Unspecified intracapsular fracture of left femur, initial encounter for closed fracture: Secondary | ICD-10-CM | POA: Diagnosis not present

## 2019-07-29 DIAGNOSIS — S0993XA Unspecified injury of face, initial encounter: Secondary | ICD-10-CM | POA: Diagnosis not present

## 2019-07-29 DIAGNOSIS — E039 Hypothyroidism, unspecified: Secondary | ICD-10-CM | POA: Diagnosis not present

## 2019-07-29 DIAGNOSIS — Z515 Encounter for palliative care: Secondary | ICD-10-CM

## 2019-07-29 DIAGNOSIS — J989 Respiratory disorder, unspecified: Secondary | ICD-10-CM

## 2019-07-29 DIAGNOSIS — G9340 Encephalopathy, unspecified: Secondary | ICD-10-CM | POA: Diagnosis not present

## 2019-07-29 HISTORY — DX: Unspecified dementia, unspecified severity, without behavioral disturbance, psychotic disturbance, mood disturbance, and anxiety: F03.90

## 2019-07-29 LAB — BASIC METABOLIC PANEL
Anion gap: 8 (ref 5–15)
BUN: 26 mg/dL — ABNORMAL HIGH (ref 8–23)
CO2: 29 mmol/L (ref 22–32)
Calcium: 9.6 mg/dL (ref 8.9–10.3)
Chloride: 95 mmol/L — ABNORMAL LOW (ref 98–111)
Creatinine, Ser: 1.15 mg/dL — ABNORMAL HIGH (ref 0.44–1.00)
GFR calc Af Amer: 52 mL/min — ABNORMAL LOW (ref >60)
GFR calc non Af Amer: 45 mL/min — ABNORMAL LOW (ref >60)
Glucose, Bld: 479 mg/dL — ABNORMAL HIGH (ref 70–99)
Potassium: 4.5 mmol/L (ref 3.5–5.1)
Sodium: 132 mmol/L — ABNORMAL LOW (ref 135–145)

## 2019-07-29 LAB — CBC
HCT: 39.9 % (ref 36.0–46.0)
Hemoglobin: 13.1 g/dL (ref 12.0–15.0)
MCH: 30 pg (ref 26.0–34.0)
MCHC: 32.8 g/dL (ref 30.0–36.0)
MCV: 91.5 fL (ref 80.0–100.0)
Platelets: 187 10*3/uL (ref 150–400)
RBC: 4.36 MIL/uL (ref 3.87–5.11)
RDW: 12.4 % (ref 11.5–15.5)
WBC: 5.1 10*3/uL (ref 4.0–10.5)
nRBC: 0 % (ref 0.0–0.2)

## 2019-07-29 LAB — RESPIRATORY PANEL BY RT PCR (FLU A&B, COVID)
Influenza A by PCR: NEGATIVE
Influenza B by PCR: NEGATIVE
SARS Coronavirus 2 by RT PCR: NEGATIVE

## 2019-07-29 LAB — HEMOGLOBIN A1C
Hgb A1c MFr Bld: 7.4 % — ABNORMAL HIGH (ref 4.8–5.6)
Mean Plasma Glucose: 165.68 mg/dL

## 2019-07-29 LAB — GLUCOSE, CAPILLARY: Glucose-Capillary: 236 mg/dL — ABNORMAL HIGH (ref 70–99)

## 2019-07-29 MED ORDER — PANTOPRAZOLE SODIUM 40 MG PO TBEC
40.0000 mg | DELAYED_RELEASE_TABLET | Freq: Every day | ORAL | Status: DC
  Administered 2019-07-31 – 2019-08-03 (×4): 40 mg via ORAL
  Filled 2019-07-29 (×5): qty 1

## 2019-07-29 MED ORDER — ATORVASTATIN CALCIUM 10 MG PO TABS
5.0000 mg | ORAL_TABLET | Freq: Every day | ORAL | Status: DC
  Administered 2019-07-30: 5 mg via ORAL
  Filled 2019-07-29 (×2): qty 1

## 2019-07-29 MED ORDER — CARBIDOPA-LEVODOPA 25-100 MG PO TABS: 0.5000 | ORAL_TABLET | Freq: Four times a day (QID) | ORAL | Status: DC

## 2019-07-29 MED ORDER — INSULIN ASPART 100 UNIT/ML ~~LOC~~ SOLN
0.0000 [IU] | Freq: Every day | SUBCUTANEOUS | Status: DC
  Administered 2019-07-29 – 2019-08-02 (×2): 2 [IU] via SUBCUTANEOUS
  Filled 2019-07-29 (×3): qty 1

## 2019-07-29 MED ORDER — CARBIDOPA-LEVODOPA 25-100 MG PO TABS
0.5000 | ORAL_TABLET | Freq: Three times a day (TID) | ORAL | Status: DC
  Administered 2019-07-30 – 2019-08-03 (×12): 0.5 via ORAL
  Filled 2019-07-29 (×16): qty 0.5

## 2019-07-29 MED ORDER — SENNOSIDES-DOCUSATE SODIUM 8.6-50 MG PO TABS: 1.0000 | ORAL_TABLET | Freq: Every evening | ORAL | Status: DC | PRN

## 2019-07-29 MED ORDER — HYDROCODONE-ACETAMINOPHEN 5-325 MG PO TABS
1.0000 | ORAL_TABLET | Freq: Four times a day (QID) | ORAL | Status: DC | PRN
  Administered 2019-07-29 – 2019-07-31 (×2): 1 via ORAL
  Filled 2019-07-29 (×2): qty 1

## 2019-07-29 MED ORDER — CARBIDOPA-LEVODOPA ER 25-100 MG PO TBCR
1.0000 | EXTENDED_RELEASE_TABLET | Freq: Every day | ORAL | Status: DC
  Administered 2019-07-29 – 2019-08-02 (×5): 1 via ORAL
  Filled 2019-07-29 (×6): qty 1

## 2019-07-29 MED ORDER — INSULIN ASPART 100 UNIT/ML ~~LOC~~ SOLN
0.0000 [IU] | Freq: Three times a day (TID) | SUBCUTANEOUS | Status: DC
  Filled 2019-07-29: qty 1

## 2019-07-29 MED ORDER — FENTANYL CITRATE (PF) 100 MCG/2ML IJ SOLN
25.0000 ug | Freq: Once | INTRAMUSCULAR | Status: AC
  Administered 2019-07-29: 25 ug via INTRAVENOUS
  Filled 2019-07-29: qty 2

## 2019-07-29 MED ORDER — SODIUM CHLORIDE 0.9 % IV SOLN: INTRAVENOUS | Status: AC

## 2019-07-29 MED ORDER — SODIUM CHLORIDE 0.9 % IV BOLUS
500.0000 mL | Freq: Once | INTRAVENOUS | Status: AC
  Administered 2019-07-29: 500 mL via INTRAVENOUS

## 2019-07-29 MED ORDER — RASAGILINE MESYLATE 1 MG PO TABS
1.0000 mg | ORAL_TABLET | Freq: Every day | ORAL | Status: DC
  Administered 2019-07-31 – 2019-08-03 (×4): 1 mg via ORAL
  Filled 2019-07-29 (×5): qty 1

## 2019-07-29 MED ORDER — INSULIN ASPART 100 UNIT/ML ~~LOC~~ SOLN
0.0000 [IU] | Freq: Three times a day (TID) | SUBCUTANEOUS | Status: DC
  Administered 2019-07-30: 5 [IU] via SUBCUTANEOUS
  Administered 2019-07-31: 3 [IU] via SUBCUTANEOUS
  Administered 2019-07-31 – 2019-08-01 (×3): 2 [IU] via SUBCUTANEOUS
  Administered 2019-08-01: 3 [IU] via SUBCUTANEOUS
  Administered 2019-08-01: 2 [IU] via SUBCUTANEOUS
  Administered 2019-08-02: 3 [IU] via SUBCUTANEOUS
  Administered 2019-08-02: 5 [IU] via SUBCUTANEOUS
  Administered 2019-08-02: 2 [IU] via SUBCUTANEOUS
  Administered 2019-08-03: 5 [IU] via SUBCUTANEOUS
  Administered 2019-08-03: 2 [IU] via SUBCUTANEOUS
  Filled 2019-07-29 (×12): qty 1

## 2019-07-29 MED ORDER — MEMANTINE HCL 5 MG PO TABS
10.0000 mg | ORAL_TABLET | Freq: Two times a day (BID) | ORAL | Status: DC
  Administered 2019-07-29 – 2019-08-03 (×9): 10 mg via ORAL
  Filled 2019-07-29 (×9): qty 2

## 2019-07-29 MED ORDER — MOMETASONE FURO-FORMOTEROL FUM 100-5 MCG/ACT IN AERO
2.0000 | INHALATION_SPRAY | Freq: Two times a day (BID) | RESPIRATORY_TRACT | Status: DC
  Administered 2019-07-29 – 2019-08-03 (×10): 2 via RESPIRATORY_TRACT
  Filled 2019-07-29: qty 8.8

## 2019-07-29 MED ORDER — LEVOTHYROXINE SODIUM 50 MCG PO TABS
50.0000 ug | ORAL_TABLET | Freq: Every day | ORAL | Status: DC
  Administered 2019-07-30 – 2019-08-03 (×5): 50 ug via ORAL
  Filled 2019-07-29 (×5): qty 1

## 2019-07-29 MED ORDER — CEFAZOLIN SODIUM-DEXTROSE 2-4 GM/100ML-% IV SOLN
2.0000 g | Freq: Once | INTRAVENOUS | Status: DC
  Filled 2019-07-29 (×2): qty 100

## 2019-07-29 MED ORDER — DONEPEZIL HCL 5 MG PO TABS
10.0000 mg | ORAL_TABLET | Freq: Every day | ORAL | Status: DC
  Administered 2019-07-29 – 2019-08-02 (×5): 10 mg via ORAL
  Filled 2019-07-29 (×5): qty 2

## 2019-07-29 MED ORDER — FLUTICASONE PROPIONATE 50 MCG/ACT NA SUSP
1.0000 | Freq: Every day | NASAL | Status: DC
  Administered 2019-07-31 – 2019-08-03 (×4): 1 via NASAL
  Filled 2019-07-29: qty 16

## 2019-07-29 MED ORDER — CARBIDOPA-LEVODOPA 25-100 MG PO TABS
1.0000 | ORAL_TABLET | Freq: Every day | ORAL | Status: DC
  Administered 2019-07-31 – 2019-08-03 (×4): 1 via ORAL
  Filled 2019-07-29 (×5): qty 1

## 2019-07-29 MED ORDER — ADULT MULTIVITAMIN W/MINERALS CH
1.0000 | ORAL_TABLET | Freq: Every day | ORAL | Status: DC
  Administered 2019-07-31 – 2019-08-03 (×4): 1 via ORAL
  Filled 2019-07-29 (×4): qty 1

## 2019-07-29 MED ORDER — MORPHINE SULFATE (PF) 2 MG/ML IV SOLN: 0.5000 mg | INTRAVENOUS | Status: DC | PRN

## 2019-07-29 NOTE — ED Notes (Signed)
Pt states she feels wet, no urine in suction catheter noted. External catheter is out of place. Cleaned pt, applied clean chux, and secured external catheter with brief. Pt verbalizes greater comfort.

## 2019-07-29 NOTE — ED Triage Notes (Signed)
Patient reports falling 07/22/19. Patient was still able to ambulate. C/o left hip pain and reports has not been able to walk well since yesterday. HX diabetes,parkinsons and dementia. Daughter present with patient. Denies taking blood thinners daily. Reports LOC with fall and unsure what is causing her to fall

## 2019-07-29 NOTE — ED Provider Notes (Signed)
[facility] Emergency Department Provider Note  ____________________________________________   First MD Initiated Contact with Patient 07/29/19 1610     (approximate)  I have reviewed the triage vital signs and the nursing notes.  History  Chief Complaint Fall Hip Pain   HPI Jacqueline [name] is a 84 y.o. female with history of Parkinson's, dementia, DM who presents emergency department for a fall on 2/13.  Patient states she was moving a chair to sit down when she suddenly "blacked out" and found herself on the floor.  She has been ambulatory since then despite having left hip pain, but yesterday developed worsening pain, which has prevented her from walking.  Pain is sharp, 7/10, located to the left hip, worsening since onset, no radiation, no alleviating/aggravating components.  She has also noticed some bruising of her face, but denies any associated pain. She is not on any blood thinning medications.    Patient states she fell because she had this "blacking out" episode, which she has had previously.  Patient and daughter bedside states they have been told this is likely related to her hx of Parkinson's. She denies any preceding chest pain, palpitations, lightheadedness, dizziness prior to passing out.  No history of arrhythmias.   Past Medical Hx Past Medical History:  Diagnosis Date  . Asthma   . Collapsed lung   . Dementia ([place])   . Diabetes mellitus without complication ([place])    Patient takes Insulin twice a day.   Marland Kitchen GERD (gastroesophageal reflux disease)   . Hyperlipidemia   . Hypertension   . Hypothyroidism   . Parkinson's disease ([place])   . Reactive airway disease     Problem List Patient Active Problem List   Diagnosis Date Noted  . Pressure injury of skin of sacral region 06/12/2019  . Wound healing, delayed 06/12/2019  . Encounter for general adult medical examination with abnormal findings 06/21/2018  . Uncontrolled type 2 diabetes  mellitus with hyperglycemia ([place]) 06/21/2018  . Asthma without status asthmaticus 07/08/2016  . Hyperlipidemia, unspecified 07/08/2016  . Dysphagia 01/07/2016  . Unsteadiness 10/01/2015  . Type 2 diabetes mellitus without complication, without long-term current use of insulin ([place]) 08/07/2015  . Mixed Alzheimer's and vascular dementia ([place]) 08/06/2015  . Controlled type 2 diabetes mellitus without complication, without long-term current use of insulin ([place]) 02/12/2015  . Diabetes type 2, controlled ([place]) 06/19/2014  . Essential hypertension 03/08/2014  . Parkinson's disease ([place]) 01/16/2014  . Fatigue 11/09/2013  . Diabetes mellitus, type II ([place]) 09/15/2013  . Difficulty walking 09/15/2013  . Dizziness 09/15/2013    Past Surgical Hx Past Surgical History:  Procedure Laterality Date  . ABDOMINAL HYSTERECTOMY    . CHOLECYSTECTOMY    . COLONOSCOPY WITH PROPOFOL N/A 03/29/2015   Procedure: COLONOSCOPY WITH PROPOFOL;  Surgeon: Hulen Luster, MD;  Location: [facility] ENDOSCOPY;  Service: Gastroenterology;  Laterality: N/A;  . ESOPHAGOGASTRODUODENOSCOPY (EGD) WITH PROPOFOL N/A 03/29/2015   Procedure: ESOPHAGOGASTRODUODENOSCOPY (EGD) WITH PROPOFOL;  Surgeon: Hulen Luster, MD;  Location: [facility] ENDOSCOPY;  Service: Gastroenterology;  Laterality: N/A;  . lung collapse     broken rib from fall punctured lung    Medications Prior to Admission medications   Medication Sig Start Date End Date Taking? Authorizing Provider  atorvastatin (LIPITOR) 10 MG tablet Take half tab po qd for high cholesterol 06/20/19   Lavera Guise, MD  azelastine (OPTIVAR) 0.05 % ophthalmic solution  08/26/17   [provider]  BD PEN NEEDLE NANO  U/F 32G X 4 MM MISC USE AS DIRECTED TWICE A DAY 03/27/19   Scarboro, Audie Clear, NP  calcium-vitamin D (OSCAL-500) 500-400 MG-UNIT tablet Take 1 tablet by mouth 2 (two) times daily.    [provider]  carbidopa-levodopa (SINEMET IR) 25-100 MG tablet Take 0.5 tablets 5 (five)  times daily by mouth.  07/07/16   [provider]  Cholecalciferol (VITAMIN D3) 2000 units capsule Take by mouth.    [provider]  donepezil (ARICEPT) 10 MG tablet Take 10 mg by mouth at bedtime.    [provider]  fluticasone (FLONASE) 50 MCG/ACT nasal spray Place into the nose.    [provider]  glucose blood (FREESTYLE LITE) test strip Use as instructed 04/16/15   Nance Pear, MD  ibuprofen (ADVIL,MOTRIN) 400 MG tablet Take 400 mg by mouth every 6 (six) hours as needed.    [provider]  insulin aspart (NOVOLOG) 100 UNIT/ML FlexPen Sliding Scale TID with meals up to 30 units 09/03/15   [provider]  Insulin Glargine (LANTUS SOLOSTAR) 100 UNIT/ML Solostar Pen Inject 10 Units daily at 10 pm into the skin.  10/01/15 05/12/20  [provider]  ketorolac (ACULAR) 0.5 % ophthalmic solution INSTILL 1 DROP INTO RIGHT EYE Daily 12/18/15   [provider]  levothyroxine (SYNTHROID, LEVOTHROID) 50 MCG tablet Take 50 mcg by mouth daily before breakfast.    [provider]  lidocaine (XYLOCAINE) 2 % solution Apply small amount to affected area three to four times daily as needed for pain. 06/12/19   Ronnell Freshwater, NP  memantine (NAMENDA) 5 MG tablet Take 1 tablet daily by mouth. 04/13/17 04/13/18  [provider]  omeprazole (PRILOSEC) 40 MG capsule TAKE 1 CAPSULE BY MOUTH EVERY DAY 02/27/19   Kendell Bane, NP  rasagiline (AZILECT) 1 MG TABS tablet Take 1 mg by mouth daily.    [provider]  sulfamethoxazole-trimethoprim (BACTRIM) 400-80 MG tablet Take 1 tablet by mouth 2 (two) times daily. 06/12/19   Ronnell Freshwater, NP  SYMBICORT 80-4.5 MCG/ACT inhaler TAKE 2 PUFFS BY MOUTH TWICE A DAY 05/18/18   Lavera Guise, MD  vitamin E 400 UNIT capsule Take 400 Units by mouth daily.    [provider]    Allergies Patient has no known allergies.  Family Hx Family History  Problem Relation  Age of Onset  . Bladder Cancer Neg Hx   . Kidney cancer Neg Hx     Social Hx Social History   Tobacco Use  . Smoking status: Never Smoker  . Smokeless tobacco: Never Used  Substance Use Topics  . Alcohol use: Not Currently  . Drug use: No     Review of Systems  Constitutional: Negative for fever, chills. Eyes: Negative for visual changes. ENT: Negative for sore throat. Cardiovascular: Negative for chest pain. Respiratory: Negative for shortness of breath. Gastrointestinal: Negative for nausea, vomiting.  Genitourinary: Negative for dysuria. Musculoskeletal: Positive for LEFT hip pain. Skin: Positive for facial bruising. Neurological: Negative for headaches.   Physical Exam  Vital Signs: ED Triage Vitals [07/29/19 1431]  Enc Vitals Group     BP 121/77     Pulse Rate 94     Resp 16     Temp 98.2 F (36.8 C)     Temp Source Oral     SpO2 100 %     Weight 104 lb (47.2 kg)     Height 5\' 3"  (1.6 m)  Head Circumference      Peak Flow      Pain Score 7     Pain Loc      Pain Edu?      Excl. in [place]?     Constitutional: Alert and oriented.  Head: Normocephalic. Atraumatic.  Ecchymosis of the right midface.  Midface is stable, no step-offs.  Nontender. Eyes: Conjunctivae clear. Sclera anicteric.  Right periorbital and midface ecchymosis.  Full EOMI, no evidence of entrapment. Nose: No congestion. No rhinorrhea.  No epistaxis. Mouth/Throat: Wearing mask.  No intraoral or dental trauma.  Jaw well aligned without malocclusion. Neck: No stridor.  No midline CS tenderness. Cardiovascular: Normal rate, regular rhythm. Extremities well perfused.  2+ symmetric DP pulses. Respiratory: Normal respiratory effort.  Lungs CTAB. Gastrointestinal: Soft. Non-tender. Non-distended.  Musculoskeletal: TTP about the left hip.  Held in flexion. Neurologic:  Normal speech and language. No gross focal neurologic deficits are appreciated.  Skin: Ecchymosis as above. Psychiatric: Mood  and affect are appropriate for situation.  EKG  Personally reviewed.   Rate: 88 Rhythm: sinus Axis: normal Intervals: WNl No acute ischemic changes No evidence of [place], WPW No STEMI    Radiology  XR LEFT hip: IMPRESSION:  Acute subcapital left hip fracture.   CT head/CS/face:  IMPRESSION:  1. No acute intracranial abnormality.  2. No acute fracture or static subluxation of the cervical spine.  3. No facial fracture.    Procedures  Procedure(s) performed (including critical care):  Procedures   Initial Impression / Assessment and Plan / ED Course  84 y.o. female who presents to the ED for syncope with resultant LEFT hip pain, facial bruising  Will obtain imaging to rule out any related acute traumatic injuries.  Will obtain blood work, EKG, urine studies to evaluate for any underlying precipitating etiology of her syncope.  XR reveals a left subcapital hip fracture. CT imaging negative for any acute intracranial/CS injury or facial fracture. Discussed with orthopedics who are aware. Labs thus far w/o actionable derangements aside from hyperglycemia, fluids and SSI ordered. Discussed w/ hospitalist for admission. Patient and daughter updated on results and plan of care and are agreeable.    Final Clinical Impression(s) / ED Diagnosis  Final diagnoses:  Fall, initial encounter  Syncope, unspecified syncope type  Closed fracture of left hip, initial encounter [facility])       Note:  This document was prepared using Dragon voice recognition software and may include unintentional dictation errors.   Lilia Pro., MD 07/30/19 [phone]

## 2019-07-29 NOTE — ED Notes (Signed)
Pt cleansed of incontinent urine. Readjusted in bed.

## 2019-07-29 NOTE — ED Notes (Signed)
Pt in CT.

## 2019-07-29 NOTE — ED Notes (Addendum)
Heads-up given to inpatient floor, transportation requested.

## 2019-07-29 NOTE — Anesthesia Preprocedure Evaluation (Addendum)
Anesthesia Evaluation  Patient identified by MRN, date of birth, ID band Patient awake and Patient confused    Reviewed: Allergy & Precautions, H&P , NPO status , Patient's Chart, lab work & pertinent test results  History of Anesthesia Complications Negative for: history of anesthetic complications  Airway Mallampati: II  TM Distance: <3 FB Neck ROM: limited    Dental  (+) Chipped   Pulmonary asthma ,           Cardiovascular Exercise Tolerance: Good hypertension, (-) angina(-) Past MI and (-) DOE      Neuro/Psych PSYCHIATRIC DISORDERS Dementia Parkinson's disease    GI/Hepatic Neg liver ROS, GERD  Controlled,  Endo/Other  diabetes, Type 2, Insulin DependentHypothyroidism   Renal/GU Renal disease     Musculoskeletal   Abdominal   Peds  Hematology negative hematology ROS (+)   Anesthesia Other Findings Past Medical History: No date: Asthma No date: Collapsed lung No date: Dementia ([place]) No date: Diabetes mellitus without complication ([place])     Comment:  Patient takes Insulin twice a day.  No date: GERD (gastroesophageal reflux disease) No date: Hyperlipidemia No date: Hypertension No date: Hypothyroidism No date: Parkinson's disease (HCC) No date: Reactive airway disease   Reproductive/Obstetrics negative OB ROS                        Anesthesia Physical Anesthesia Plan  ASA: III  Anesthesia Plan: Spinal   Post-op Pain Management:    Induction:   PONV Risk Score and Plan: Propofol infusion  Airway Management Planned: Natural Airway and Nasal Cannula  Additional Equipment:   Intra-op Plan:   Post-operative Plan:   Informed Consent: I have reviewed the patients History and Physical, chart, labs and discussed the procedure including the risks, benefits and alternatives for the proposed anesthesia with the patient or authorized representative who has indicated his/her  understanding and acceptance.     Dental Advisory Given  Plan Discussed with: Anesthesiologist, CRNA and Surgeon  Anesthesia Plan Comments: (History and phone consent from the patients daughter Sharman Cheek at [phone]   Daughter reports no bleeding problems and no anticoagulant use.  Plan for spinal with backup GA  )    Anesthesia Quick Evaluation

## 2019-07-29 NOTE — H&P (Signed)
History and Physical    Jacqueline [name] [id number] DOB: July 10, 1935 DOA: 07/29/2019  PCP: Lavera Guise, MD   Patient coming from: home I have personally briefly reviewed patient's old medical records in [place]  Chief Complaint: fall a week ago, difficulty ambulating  HPI: Jacqueline [name] is a 84 y.o. female with medical history significant for Parkinson's disease, with associated dyskinesia, ambulant with a cane at baseline with history of falls, history of Parkinson's dementia, type 2 diabetes, CKD 3, hyperreactive airway on Symbicort and hypothyroidism who presented to the emergency room with persistent left hip pain following a fall on 07/22/2019, associated with decreased ability to ambulate.  According to the daughter who gives most of the history, patient fell while trying to transfer from walker to chair.  She has a right sided facial bruise resulting from the fall but she had no loss of consciousness.   ED Course: On arrival in the emergency room, vitals were within normal limits.  Blood work was significant for sodium of 132, glucose 479.  Creatinine was 1.15 but this is her baseline for her CKD.  EKG, normal sinus rhythm with no ST-T wave changes.  Hip x-ray showed acute subcapital left hip fracture.  CT head C-spine and maxillofacial pending at time of admission. Review of Systems: As per HPI otherwise 10 point review of systems negative.    Past Medical History:  Diagnosis Date  . Asthma   . Collapsed lung   . Dementia ([place])   . Diabetes mellitus without complication ([place])    Patient takes Insulin twice a day.   Marland Kitchen GERD (gastroesophageal reflux disease)   . Hyperlipidemia   . Hypertension   . Hypothyroidism   . Parkinson's disease ([place])   . Reactive airway disease     Past Surgical History:  Procedure Laterality Date  . ABDOMINAL HYSTERECTOMY    . CHOLECYSTECTOMY    . COLONOSCOPY WITH PROPOFOL N/A 03/29/2015   Procedure: COLONOSCOPY WITH PROPOFOL;  Surgeon:  Hulen Luster, MD;  Location: [facility] ENDOSCOPY;  Service: Gastroenterology;  Laterality: N/A;  . ESOPHAGOGASTRODUODENOSCOPY (EGD) WITH PROPOFOL N/A 03/29/2015   Procedure: ESOPHAGOGASTRODUODENOSCOPY (EGD) WITH PROPOFOL;  Surgeon: Hulen Luster, MD;  Location: [facility] ENDOSCOPY;  Service: Gastroenterology;  Laterality: N/A;  . lung collapse     broken rib from fall punctured lung     reports that she has never smoked. She has never used smokeless tobacco. She reports previous alcohol use. She reports that she does not use drugs.  No Known Allergies  Family History  Problem Relation Age of Onset  . Bladder Cancer Neg Hx   . Kidney cancer Neg Hx      Prior to Admission medications   Medication Sig Start Date End Date Taking? Authorizing Provider  atorvastatin (LIPITOR) 10 MG tablet Take half tab po qd for high cholesterol 06/20/19  Yes Lavera Guise, MD  carbidopa-levodopa (SINEMET IR) 25-100 MG tablet Take by mouth See admin instructions. Take 1 tablet by mouth at 10 am, take 1/2 tablet by mouth at 1 pm, take 1/2 tablet at 4 pm, take 1/2 tablet at 7 pm. 07/07/16  Yes [provider]  Carbidopa-Levodopa ER (SINEMET CR) 25-100 MG tablet controlled release Take 1 tablet by mouth at bedtime. 04/09/19  Yes [provider]  donepezil (ARICEPT) 10 MG tablet Take 10 mg by mouth at bedtime.   Yes [provider]  fluticasone (FLONASE) 50 MCG/ACT nasal spray Place 1 spray into the  nose daily.    Yes [provider]  Insulin Glargine (BASAGLAR KWIKPEN) 100 UNIT/ML SOPN Inject 14 Units into the skin daily.   Yes [provider]  levothyroxine (SYNTHROID, LEVOTHROID) 50 MCG tablet Take 50 mcg by mouth daily before breakfast.   Yes [provider]  memantine (NAMENDA) 10 MG tablet Take 10 mg by mouth 2 (two) times daily.  04/13/17 07/29/19 Yes [provider]  Multiple Vitamin (MULTIVITAMIN WITH MINERALS) TABS tablet Take 1 tablet by mouth daily.   Yes  [provider]  omeprazole (PRILOSEC) 40 MG capsule TAKE 1 CAPSULE BY MOUTH EVERY DAY 02/27/19  Yes Scarboro, Audie Clear, NP  rasagiline (AZILECT) 1 MG TABS tablet Take 1 mg by mouth daily.   Yes [provider]  SYMBICORT 80-4.5 MCG/ACT inhaler TAKE 2 PUFFS BY MOUTH TWICE A DAY 05/18/18  Yes Lavera Guise, MD    Physical Exam: Vitals:   07/29/19 1431 07/29/19 1852 07/29/19 1900  BP: 121/77 (!) 171/93 (!) 171/97  Pulse: 94 81 90  Resp: 16 18   Temp: 98.2 F (36.8 C)    TempSrc: Oral    SpO2: 100% 97% 94%  Weight: 47.2 kg    Height: 5\' 3"  (1.6 m)       Vitals:   07/29/19 1431 07/29/19 1852 07/29/19 1900  BP: 121/77 (!) 171/93 (!) 171/97  Pulse: 94 81 90  Resp: 16 18   Temp: 98.2 F (36.8 C)    TempSrc: Oral    SpO2: 100% 97% 94%  Weight: 47.2 kg    Height: 5\' 3"  (1.6 m)      Constitutional: NAD, alert and oriented x 3 Eyes: PERRL, lids and conjunctivae normal ENMT: Mucous membranes are moist. Waning bruise on right side of face involving infraorbital and nasolabia area Neck: normal, supple, no masses, no thyromegaly Respiratory: clear to auscultation bilaterally, no wheezing, no crackles. Normal respiratory effort. No accessory muscle use.  Cardiovascular: Regular rate and rhythm, no murmurs / rubs / gallops. No extremity edema. 2+ pedal pulses. No carotid bruits.  Abdomen: no tenderness, no masses palpated. No hepatosplenomegaly. Bowel sounds positive.  Musculoskeletal: no clubbing / cyanosis.patient sitting in bed with legs flexed Extension of knee and ankle WNL.  Neurovascularly intact  skin: no rashes, lesions, ulcers.  Neurologic: No gross focal neurologic deficit. Psychiatric: Normal mood and affect.   Labs on Admission: I have personally reviewed following labs and imaging studies  CBC: Recent Labs  Lab 07/29/19 1436  WBC 5.1  HGB 13.1  HCT 39.9  MCV 91.5  PLT 123XX123   Basic Metabolic Panel: Recent Labs  Lab 07/29/19 1436  NA 132*  K  4.5  CL 95*  CO2 29  GLUCOSE 479*  BUN 26*  CREATININE 1.15*  CALCIUM 9.6   GFR: Estimated Creatinine Clearance: 28.6 mL/min (A) (by C-G formula based on SCr of 1.15 mg/dL (H)). Liver Function Tests: No results for input(s): AST, ALT, ALKPHOS, BILITOT, PROT, ALBUMIN in the last 168 hours. No results for input(s): LIPASE, AMYLASE in the last 168 hours. No results for input(s): AMMONIA in the last 168 hours. Coagulation Profile: No results for input(s): INR, PROTIME in the last 168 hours. Cardiac Enzymes: No results for input(s): CKTOTAL, CKMB, CKMBINDEX, TROPONINI in the last 168 hours. BNP (last 3 results) No results for input(s): PROBNP in the last 8760 hours. HbA1C: No results for input(s): HGBA1C in the last 72 hours. CBG: No results for input(s): GLUCAP in the last 168  hours. Lipid Profile: No results for input(s): CHOL, HDL, LDLCALC, TRIG, CHOLHDL, LDLDIRECT in the last 72 hours. Thyroid Function Tests: No results for input(s): TSH, T4TOTAL, FREET4, T3FREE, THYROIDAB in the last 72 hours. Anemia Panel: No results for input(s): VITAMINB12, FOLATE, FERRITIN, TIBC, IRON, RETICCTPCT in the last 72 hours. Urine analysis:    Component Value Date/Time   COLORURINE YELLOW (A) 04/25/2019 0335   APPEARANCEUR Clear 06/20/2019 0000   LABSPEC 1.004 (L) 04/25/2019 0335   LABSPEC 1.031 08/11/2012 2247   PHURINE 6.0 04/25/2019 0335   GLUCOSEU Negative 06/20/2019 0000   GLUCOSEU 50 mg/dL 08/11/2012 2247   HGBUR SMALL (A) 04/25/2019 0335   BILIRUBINUR Negative 06/20/2019 0000   BILIRUBINUR Negative 08/11/2012 2247   KETONESUR NEGATIVE 04/25/2019 0335   PROTEINUR Negative 06/20/2019 0000   PROTEINUR NEGATIVE 04/25/2019 0335   NITRITE Negative 06/20/2019 0000   NITRITE NEGATIVE 04/25/2019 0335   LEUKOCYTESUR Negative 06/20/2019 0000   LEUKOCYTESUR NEGATIVE 04/25/2019 0335   LEUKOCYTESUR Negative 08/11/2012 2247    Radiological Exams on Admission: DG HIP UNILAT WITH PELVIS 2-3  VIEWS LEFT  Result Date: 07/29/2019 CLINICAL DATA:  Left hip pain for 3 days since a fall. Initial encounter. EXAM: DG HIP (WITH OR WITHOUT PELVIS) 2-3V LEFT COMPARISON:  Plain films pelvis right hip 06/17/2019. FINDINGS: There is an acute, mildly impacted subcapital fracture of the left hip. No other acute abnormality. IMPRESSION: Acute subcapital left hip fracture. Electronically Signed   By: Inge Rise M.D.   On: 07/29/2019 16:54   EKG: Independently reviewed.   Assessment/Plan Principal Problem:   Closed left hip fracture (HCC)   Fall at home, accidental, initial encounter   Difficulty walking with frequent falls   Parkinson's disease ([place]) -Acute subcapital left hip fracture on xray --Ortho consult to determine further management, --Pending CT head and C-spine to evaluate -Pain management  Preoperative clearance -No history of CAD, CHF, cardiac arrhythmia, syncopal events, COPD, OSA, CVA.  No exercise intolerance though limited by ambulatory dysfunction -Low risk for perioperative cardiopulmonary events and can proceed with orthopedic intervention  For preservation of functional independence.  Hyperglycemia, type 2 diabetes mellitus with complications, stage 3 chronic kidney disease (HCC) -Sugar was 479 on admission, hemoglobin A1c 7.4 --Hold home diabetic medication regimen -Sliding scale insulin coverage  Facial bruise -appears stable and improving --Follow-up CT maxillofacial -Compresses, warm cold as needed for comfort.  Acquired hypothyroidism -Continue home levothyroxine    Dementia due to Parkinson's disease without behavioral disturbance (HCC)  -Continue Aricept and Namenda    Reactive airway disease that is not asthma -Continue Flonase and Symbicort      DVT prophylaxis: SCD  Code Status: full code Family Communication: Daughter, Administrator, Civil Service Disposition Plan: Back to previous home environment Consults called: Ortho, Dr Roland Rack   Athena Masse  MD [facility]     07/29/2019, 8:03 PM

## 2019-07-29 NOTE — ED Notes (Signed)
Patient transported to CT 

## 2019-07-29 NOTE — ED Notes (Signed)
Patient is moving to much for CT to be able to capture scan. CT will bring patient back and 1900 Parkinson's medications will not be given they will be held and CT will be attempted after to see if parkinson symptoms are not at severe to attain CT scan.

## 2019-07-30 ENCOUNTER — Inpatient Hospital Stay: Payer: Medicare HMO | Admitting: Anesthesiology

## 2019-07-30 ENCOUNTER — Inpatient Hospital Stay: Payer: Medicare HMO

## 2019-07-30 ENCOUNTER — Encounter
Admission: EM | Disposition: A | Discharge status: Home Health | Payer: Self-pay | Source: Home / Self Care | Attending: Internal Medicine

## 2019-07-30 DIAGNOSIS — L8990 Pressure ulcer of unspecified site, unspecified stage: Secondary | ICD-10-CM | POA: Insufficient documentation

## 2019-07-30 HISTORY — PX: HIP PINNING,CANNULATED: SHX1758

## 2019-07-30 LAB — URINALYSIS, COMPLETE (UACMP) WITH MICROSCOPIC
Bacteria, UA: NONE SEEN
Bilirubin Urine: NEGATIVE
Glucose, UA: NEGATIVE mg/dL
Ketones, ur: NEGATIVE mg/dL
Leukocytes,Ua: NEGATIVE
Nitrite: NEGATIVE
Protein, ur: NEGATIVE mg/dL
Specific Gravity, Urine: 1.013 (ref 1.005–1.030)
pH: 6 (ref 5.0–8.0)

## 2019-07-30 LAB — SURGICAL PCR SCREEN
MRSA, PCR: NEGATIVE
Staphylococcus aureus: NEGATIVE

## 2019-07-30 LAB — GLUCOSE, CAPILLARY
Glucose-Capillary: 162 mg/dL — ABNORMAL HIGH (ref 70–99)
Glucose-Capillary: 183 mg/dL — ABNORMAL HIGH (ref 70–99)
Glucose-Capillary: 255 mg/dL — ABNORMAL HIGH (ref 70–99)
Glucose-Capillary: 90 mg/dL (ref 70–99)

## 2019-07-30 SURGERY — FIXATION, FEMUR, NECK, PERCUTANEOUS, USING SCREW
Anesthesia: Spinal | Site: Hip | Laterality: Left

## 2019-07-30 MED ORDER — DIPHENHYDRAMINE HCL 12.5 MG/5ML PO ELIX: 12.5000 mg | ORAL_SOLUTION | ORAL | Status: DC | PRN

## 2019-07-30 MED ORDER — CEFAZOLIN SODIUM-DEXTROSE 2-3 GM-%(50ML) IV SOLR
INTRAVENOUS | Status: DC | PRN
  Administered 2019-07-30: 2 g via INTRAVENOUS

## 2019-07-30 MED ORDER — CEFAZOLIN SODIUM-DEXTROSE 2-4 GM/100ML-% IV SOLN
2.0000 g | Freq: Four times a day (QID) | INTRAVENOUS | Status: AC
  Administered 2019-07-30 – 2019-07-31 (×3): 2 g via INTRAVENOUS
  Filled 2019-07-30 (×3): qty 100

## 2019-07-30 MED ORDER — PHENYLEPHRINE HCL (PRESSORS) 10 MG/ML IV SOLN
INTRAVENOUS | Status: DC | PRN
  Administered 2019-07-30 (×2): 100 ug via INTRAVENOUS

## 2019-07-30 MED ORDER — LACTATED RINGERS IV SOLN: INTRAVENOUS | Status: DC | PRN

## 2019-07-30 MED ORDER — DOCUSATE SODIUM 100 MG PO CAPS
100.0000 mg | ORAL_CAPSULE | Freq: Two times a day (BID) | ORAL | Status: DC
  Administered 2019-07-30 – 2019-08-03 (×8): 100 mg via ORAL
  Filled 2019-07-30 (×8): qty 1

## 2019-07-30 MED ORDER — METOCLOPRAMIDE HCL 5 MG/ML IJ SOLN
5.0000 mg | Freq: Three times a day (TID) | INTRAMUSCULAR | Status: DC | PRN
  Administered 2019-07-31: 10 mg via INTRAVENOUS
  Filled 2019-07-30: qty 2

## 2019-07-30 MED ORDER — METOCLOPRAMIDE HCL 10 MG PO TABS: 5.0000 mg | ORAL_TABLET | Freq: Three times a day (TID) | ORAL | Status: DC | PRN

## 2019-07-30 MED ORDER — TRAMADOL HCL 50 MG PO TABS
50.0000 mg | ORAL_TABLET | Freq: Four times a day (QID) | ORAL | Status: DC | PRN
  Administered 2019-07-30 – 2019-07-31 (×3): 50 mg via ORAL
  Filled 2019-07-30 (×3): qty 1

## 2019-07-30 MED ORDER — PROPOFOL 10 MG/ML IV BOLUS
INTRAVENOUS | Status: DC | PRN
  Administered 2019-07-30: 30 mg via INTRAVENOUS
  Administered 2019-07-30: 30 ug/kg/min via INTRAVENOUS

## 2019-07-30 MED ORDER — ONDANSETRON HCL 4 MG PO TABS: 4.0000 mg | ORAL_TABLET | Freq: Four times a day (QID) | ORAL | Status: DC | PRN

## 2019-07-30 MED ORDER — SODIUM CHLORIDE 0.9 % IV SOLN: INTRAVENOUS | Status: DC | PRN

## 2019-07-30 MED ORDER — FENTANYL CITRATE (PF) 100 MCG/2ML IJ SOLN
INTRAMUSCULAR | Status: AC
  Filled 2019-07-30: qty 2

## 2019-07-30 MED ORDER — ENOXAPARIN SODIUM 30 MG/0.3ML ~~LOC~~ SOLN
30.0000 mg | SUBCUTANEOUS | Status: DC
  Filled 2019-07-30: qty 0.3

## 2019-07-30 MED ORDER — BUPIVACAINE-EPINEPHRINE (PF) 0.5% -1:200000 IJ SOLN
INTRAMUSCULAR | Status: DC | PRN
  Administered 2019-07-30: 10 mL via PERINEURAL

## 2019-07-30 MED ORDER — FLEET ENEMA 7-19 GM/118ML RE ENEM: 1.0000 | ENEMA | Freq: Once | RECTAL | Status: DC | PRN

## 2019-07-30 MED ORDER — BUPIVACAINE HCL (PF) 0.5 % IJ SOLN
INTRAMUSCULAR | Status: DC | PRN
  Administered 2019-07-30: 2.5 mL

## 2019-07-30 MED ORDER — PROPOFOL 10 MG/ML IV BOLUS
INTRAVENOUS | Status: AC
  Filled 2019-07-30: qty 20

## 2019-07-30 MED ORDER — SODIUM CHLORIDE 0.9 % IV SOLN
INTRAVENOUS | Status: DC
  Administered 2019-07-30: 75 mL/h via INTRAVENOUS

## 2019-07-30 MED ORDER — MAGNESIUM HYDROXIDE 400 MG/5ML PO SUSP: 30.0000 mL | Freq: Every day | ORAL | Status: DC | PRN

## 2019-07-30 MED ORDER — CHLORHEXIDINE GLUCONATE CLOTH 2 % EX PADS
6.0000 | MEDICATED_PAD | Freq: Every day | CUTANEOUS | Status: DC
  Administered 2019-07-30 – 2019-08-03 (×4): 6 via TOPICAL

## 2019-07-30 MED ORDER — ACETAMINOPHEN 500 MG PO TABS
500.0000 mg | ORAL_TABLET | Freq: Four times a day (QID) | ORAL | Status: AC
  Administered 2019-07-30 – 2019-07-31 (×3): 500 mg via ORAL
  Filled 2019-07-30 (×4): qty 1

## 2019-07-30 MED ORDER — ACETAMINOPHEN 325 MG PO TABS
325.0000 mg | ORAL_TABLET | Freq: Four times a day (QID) | ORAL | Status: DC | PRN
  Administered 2019-07-31: 500 mg via ORAL
  Administered 2019-08-01 – 2019-08-03 (×5): 650 mg via ORAL
  Filled 2019-07-30 (×6): qty 2

## 2019-07-30 MED ORDER — FENTANYL CITRATE (PF) 100 MCG/2ML IJ SOLN: 25.0000 ug | INTRAMUSCULAR | Status: DC | PRN

## 2019-07-30 MED ORDER — BISACODYL 10 MG RE SUPP: 10.0000 mg | Freq: Every day | RECTAL | Status: DC | PRN

## 2019-07-30 MED ORDER — FENTANYL CITRATE (PF) 100 MCG/2ML IJ SOLN
INTRAMUSCULAR | Status: DC | PRN
  Administered 2019-07-30 (×2): 25 ug via INTRAVENOUS
  Administered 2019-07-30: 50 ug via INTRAVENOUS

## 2019-07-30 MED ORDER — MUPIROCIN 2 % EX OINT
1.0000 "application " | TOPICAL_OINTMENT | Freq: Two times a day (BID) | CUTANEOUS | Status: DC
  Administered 2019-07-31 – 2019-08-03 (×7): 1 via NASAL
  Filled 2019-07-30: qty 22

## 2019-07-30 MED ORDER — ONDANSETRON HCL 4 MG/2ML IJ SOLN
4.0000 mg | Freq: Four times a day (QID) | INTRAMUSCULAR | Status: DC | PRN
  Administered 2019-07-31: 4 mg via INTRAVENOUS
  Filled 2019-07-30: qty 2

## 2019-07-30 MED ORDER — ENOXAPARIN SODIUM 40 MG/0.4ML ~~LOC~~ SOLN: 40.0000 mg | SUBCUTANEOUS | Status: DC

## 2019-07-30 SURGICAL SUPPLY — 31 items
BIT DRILL 4.8X200 CANN (BIT) ×2 IMPLANT
BNDG COHESIVE 4X5 TAN STRL (GAUZE/BANDAGES/DRESSINGS) ×2 IMPLANT
BNDG COHESIVE 6X5 TAN STRL LF (GAUZE/BANDAGES/DRESSINGS) ×2 IMPLANT
CANISTER SUCT 1200ML W/VALVE (MISCELLANEOUS) ×2 IMPLANT
CHLORAPREP W/TINT 26 (MISCELLANEOUS) ×4 IMPLANT
COVER WAND RF STERILE (DRAPES) ×2 IMPLANT
DRSG OPSITE POSTOP 3X4 (GAUZE/BANDAGES/DRESSINGS) ×2 IMPLANT
ELECT REM PT RETURN 9FT ADLT (ELECTROSURGICAL) ×2
ELECTRODE REM PT RTRN 9FT ADLT (ELECTROSURGICAL) ×1 IMPLANT
GLOVE BIO SURGEON STRL SZ8 (GLOVE) ×4 IMPLANT
GLOVE INDICATOR 8.0 STRL GRN (GLOVE) ×2 IMPLANT
GOWN STRL REUS W/ TWL LRG LVL3 (GOWN DISPOSABLE) ×1 IMPLANT
GOWN STRL REUS W/ TWL XL LVL3 (GOWN DISPOSABLE) ×1 IMPLANT
GOWN STRL REUS W/TWL LRG LVL3 (GOWN DISPOSABLE) ×1
GOWN STRL REUS W/TWL XL LVL3 (GOWN DISPOSABLE) ×1
NEEDLE FILTER BLUNT 18X 1/2SAF (NEEDLE) ×1
NEEDLE FILTER BLUNT 18X1 1/2 (NEEDLE) ×1 IMPLANT
NEEDLE HYPO 22GX1.5 SAFETY (NEEDLE) ×2 IMPLANT
PACK HIP COMPR (MISCELLANEOUS) ×2 IMPLANT
PIN GUIDE DRILL TIP 2.8X300 (DRILL) ×6 IMPLANT
SCREW 16MM THREAD 6.5X75MM (Screw) ×2 IMPLANT
SCREW CANN THRD 6.5X70 (Screw) ×4 IMPLANT
STAPLER SKIN PROX 35W (STAPLE) ×2 IMPLANT
STRAP SAFETY 5IN WIDE (MISCELLANEOUS) ×2 IMPLANT
SUT PROLENE 2 0 FS (SUTURE) IMPLANT
SUT VIC AB 0 CT1 36 (SUTURE) ×2 IMPLANT
SUT VIC AB 0 SH 27 (SUTURE) IMPLANT
SUT VIC AB 2-0 CT1 27 (SUTURE) ×1
SUT VIC AB 2-0 CT1 TAPERPNT 27 (SUTURE) ×1 IMPLANT
SYR 20ML LL LF (SYRINGE) ×2 IMPLANT
SYR 5ML LL (SYRINGE) ×2 IMPLANT

## 2019-07-30 NOTE — Progress Notes (Signed)
Pts BP 181/89, MD Manuella Ghazi notified and made aware, no orders received

## 2019-07-30 NOTE — Progress Notes (Signed)
[facility] NAME: Jacqueline [name]    MR#:  [id number]  DATE OF BIRTH:  Aug 26, 1935  SUBJECTIVE:  CHIEF COMPLAINT:  No chief complaint on file. minimal hip pain, waiting for surgery REVIEW OF SYSTEMS:  Review of Systems  Constitutional: Negative for diaphoresis, fever, malaise/fatigue and weight loss.  HENT: Negative for ear discharge, ear pain, hearing loss, nosebleeds, sore throat and tinnitus.   Eyes: Negative for blurred vision and pain.  Respiratory: Negative for cough, hemoptysis, shortness of breath and wheezing.   Cardiovascular: Negative for chest pain, palpitations, orthopnea and leg swelling.  Gastrointestinal: Negative for abdominal pain, blood in stool, constipation, diarrhea, heartburn, nausea and vomiting.  Genitourinary: Negative for dysuria, frequency and urgency.  Musculoskeletal: Positive for falls and joint pain. Negative for back pain and myalgias.  Skin: Negative for itching and rash.  Neurological: Negative for dizziness, tingling, tremors, focal weakness, seizures, weakness and headaches.  Psychiatric/Behavioral: Negative for depression. The patient is not nervous/anxious.    DRUG ALLERGIES:  No Known Allergies VITALS:  Blood pressure (!) 165/76, pulse 78, temperature 97.7 F (36.5 C), temperature source Oral, resp. rate 18, height 5\' 3"  (1.6 m), weight 47.2 kg, SpO2 96 %. PHYSICAL EXAMINATION:  Physical Exam HENT:     Head: Normocephalic and atraumatic.     Comments: Waning bruise on right side of face involving infraorbital and nasolabia area Eyes:     Conjunctiva/sclera: Conjunctivae normal.     Pupils: Pupils are equal, round, and reactive to light.  Neck:     Thyroid: No thyromegaly.     Trachea: No tracheal deviation.  Cardiovascular:     Rate and Rhythm: Normal rate and regular rhythm.     Heart sounds: Normal heart sounds.  Pulmonary:     Effort: Pulmonary effort is normal. No respiratory distress.     Breath  sounds: Normal breath sounds. No wheezing.  Chest:     Chest wall: No tenderness.  Abdominal:     General: Bowel sounds are normal. There is no distension.     Palpations: Abdomen is soft.     Tenderness: There is no abdominal tenderness.  Musculoskeletal:        General: Normal range of motion.     Cervical back: Normal range of motion and neck supple.     Left lower leg: Tenderness present.       Legs:  Skin:    General: Skin is warm and dry.     Findings: No rash.  Neurological:     Mental Status: She is alert and oriented to person, place, and time.     Cranial Nerves: No cranial nerve deficit.    LABORATORY PANEL:  Female CBC Recent Labs  Lab 07/29/19 1436  WBC 5.1  HGB 13.1  HCT 39.9  PLT 187   ------------------------------------------------------------------------------------------------------------------ Chemistries  Recent Labs  Lab 07/29/19 1436  NA 132*  K 4.5  CL 95*  CO2 29  GLUCOSE 479*  BUN 26*  CREATININE 1.15*  CALCIUM 9.6   RADIOLOGY:  CT Head Wo Contrast  Result Date: 07/29/2019 CLINICAL DATA:  Fall EXAM: CT HEAD WITHOUT CONTRAST CT MAXILLOFACIAL WITHOUT CONTRAST CT CERVICAL SPINE WITHOUT CONTRAST TECHNIQUE: Multidetector CT imaging of the head, cervical spine, and maxillofacial structures were performed using the standard protocol without intravenous contrast. Multiplanar CT image reconstructions of the cervical spine and maxillofacial structures were also generated. COMPARISON:  06/17/2019 FINDINGS: CT HEAD FINDINGS Brain: There is no mass, hemorrhage  or extra-axial collection. The size and configuration of the ventricles and extra-axial CSF spaces are normal. The brain parenchyma is normal, without evidence of acute or chronic infarction. Vascular: No abnormal hyperdensity of the major intracranial arteries or dural venous sinuses. No intracranial atherosclerosis. Skull: The visualized skull base, calvarium and extracranial soft tissues are  normal. CT MAXILLOFACIAL FINDINGS Osseous: --Complex facial fracture types: No LeFort, zygomaticomaxillary complex or nasoorbitoethmoidal fracture. --Simple fracture types: None. --Mandible: No fracture or dislocation. Orbits: The globes are intact. Normal appearance of the intra- and extraconal fat. Symmetric extraocular muscles and optic nerves. Sinuses: Chronic left maxillary sinusitis. Soft tissues: Normal visualized extracranial soft tissues. CT CERVICAL SPINE FINDINGS Alignment: Grade 1 anterolisthesis at C4-5 and C5-6, most likely due to facet arthrosis. Skull base and vertebrae: No acute fracture. Soft tissues and spinal canal: No prevertebral fluid or swelling. No visible canal hematoma. Disc levels: Fusion of the right C2-3 and left C3-4 facets. Severe facet arthrosis at left C4-7. No bony spinal canal stenosis. Upper chest: No pneumothorax, pulmonary nodule or pleural effusion. Other: Normal visualized paraspinal cervical soft tissues. IMPRESSION: 1. No acute intracranial abnormality. 2. No acute fracture or static subluxation of the cervical spine. 3. No facial fracture. Electronically Signed   By: Ulyses Jarred M.D.   On: 07/29/2019 20:32   CT Cervical Spine Wo Contrast  Result Date: 07/29/2019 CLINICAL DATA:  Fall EXAM: CT HEAD WITHOUT CONTRAST CT MAXILLOFACIAL WITHOUT CONTRAST CT CERVICAL SPINE WITHOUT CONTRAST TECHNIQUE: Multidetector CT imaging of the head, cervical spine, and maxillofacial structures were performed using the standard protocol without intravenous contrast. Multiplanar CT image reconstructions of the cervical spine and maxillofacial structures were also generated. COMPARISON:  06/17/2019 FINDINGS: CT HEAD FINDINGS Brain: There is no mass, hemorrhage or extra-axial collection. The size and configuration of the ventricles and extra-axial CSF spaces are normal. The brain parenchyma is normal, without evidence of acute or chronic infarction. Vascular: No abnormal hyperdensity of the  major intracranial arteries or dural venous sinuses. No intracranial atherosclerosis. Skull: The visualized skull base, calvarium and extracranial soft tissues are normal. CT MAXILLOFACIAL FINDINGS Osseous: --Complex facial fracture types: No LeFort, zygomaticomaxillary complex or nasoorbitoethmoidal fracture. --Simple fracture types: None. --Mandible: No fracture or dislocation. Orbits: The globes are intact. Normal appearance of the intra- and extraconal fat. Symmetric extraocular muscles and optic nerves. Sinuses: Chronic left maxillary sinusitis. Soft tissues: Normal visualized extracranial soft tissues. CT CERVICAL SPINE FINDINGS Alignment: Grade 1 anterolisthesis at C4-5 and C5-6, most likely due to facet arthrosis. Skull base and vertebrae: No acute fracture. Soft tissues and spinal canal: No prevertebral fluid or swelling. No visible canal hematoma. Disc levels: Fusion of the right C2-3 and left C3-4 facets. Severe facet arthrosis at left C4-7. No bony spinal canal stenosis. Upper chest: No pneumothorax, pulmonary nodule or pleural effusion. Other: Normal visualized paraspinal cervical soft tissues. IMPRESSION: 1. No acute intracranial abnormality. 2. No acute fracture or static subluxation of the cervical spine. 3. No facial fracture. Electronically Signed   By: Ulyses Jarred M.D.   On: 07/29/2019 20:32   DG HIP UNILAT WITH PELVIS 2-3 VIEWS LEFT  Result Date: 07/29/2019 CLINICAL DATA:  Left hip pain for 3 days since a fall. Initial encounter. EXAM: DG HIP (WITH OR WITHOUT PELVIS) 2-3V LEFT COMPARISON:  Plain films pelvis right hip 06/17/2019. FINDINGS: There is an acute, mildly impacted subcapital fracture of the left hip. No other acute abnormality. IMPRESSION: Acute subcapital left hip fracture. Electronically Signed   By: Marcello Moores  Dalessio M.D.   On: 07/29/2019 16:54   CT Maxillofacial WO CM  Result Date: 07/29/2019 CLINICAL DATA:  Fall EXAM: CT HEAD WITHOUT CONTRAST CT MAXILLOFACIAL WITHOUT  CONTRAST CT CERVICAL SPINE WITHOUT CONTRAST TECHNIQUE: Multidetector CT imaging of the head, cervical spine, and maxillofacial structures were performed using the standard protocol without intravenous contrast. Multiplanar CT image reconstructions of the cervical spine and maxillofacial structures were also generated. COMPARISON:  06/17/2019 FINDINGS: CT HEAD FINDINGS Brain: There is no mass, hemorrhage or extra-axial collection. The size and configuration of the ventricles and extra-axial CSF spaces are normal. The brain parenchyma is normal, without evidence of acute or chronic infarction. Vascular: No abnormal hyperdensity of the major intracranial arteries or dural venous sinuses. No intracranial atherosclerosis. Skull: The visualized skull base, calvarium and extracranial soft tissues are normal. CT MAXILLOFACIAL FINDINGS Osseous: --Complex facial fracture types: No LeFort, zygomaticomaxillary complex or nasoorbitoethmoidal fracture. --Simple fracture types: None. --Mandible: No fracture or dislocation. Orbits: The globes are intact. Normal appearance of the intra- and extraconal fat. Symmetric extraocular muscles and optic nerves. Sinuses: Chronic left maxillary sinusitis. Soft tissues: Normal visualized extracranial soft tissues. CT CERVICAL SPINE FINDINGS Alignment: Grade 1 anterolisthesis at C4-5 and C5-6, most likely due to facet arthrosis. Skull base and vertebrae: No acute fracture. Soft tissues and spinal canal: No prevertebral fluid or swelling. No visible canal hematoma. Disc levels: Fusion of the right C2-3 and left C3-4 facets. Severe facet arthrosis at left C4-7. No bony spinal canal stenosis. Upper chest: No pneumothorax, pulmonary nodule or pleural effusion. Other: Normal visualized paraspinal cervical soft tissues. IMPRESSION: 1. No acute intracranial abnormality. 2. No acute fracture or static subluxation of the cervical spine. 3. No facial fracture. Electronically Signed   By: Ulyses Jarred  M.D.   On: 07/29/2019 20:32   ASSESSMENT AND PLAN:  REAM ALLEYNE is a 84 y.o. female with medical history significant for Parkinson's disease, with associated dyskinesia, ambulant with a cane at baseline with history of falls, history of Parkinson's dementia, type 2 diabetes, CKD 3, hyperreactive airway on Symbicort and hypothyroidism who presented to the emergency room with persistent left hip pain following a fall on 07/22/2019, associated with decreased ability to ambulate  Closed left hip fracture ([place])   Fall at home, accidental, initial encounter   Difficulty walking with frequent falls   Parkinson's disease ([place]) -Acute subcapital left hip fracture on xray --Ortho planning surgery on 2/21 --Pending CT head and C-spine -> no acute patho -Pain management  Preoperative clearance -No history of CAD, CHF, cardiac arrhythmia, syncopal events, COPD, OSA, CVA. No exercise intolerance though limited by ambulatory dysfunction -Low risk for perioperative cardiopulmonary events and can proceed with orthopedic intervention  For preservation of functional independence.  Hyperglycemia, type 2 diabetes mellitus with complications, stage 3 chronic kidney disease (HCC) -Sugar was 479 on admission, hemoglobin A1c 7.4 --Hold home diabetic medication regimen -Sliding scale insulin coverage  Facial bruise -appears stable and improving --CT maxillofacial - no acute patho -Compresses, warm cold as needed for comfort.  Acquired hypothyroidism -Continue home levothyroxine    Dementia due to Parkinson's disease without behavioral disturbance (HCC)  -Continue Aricept and Namenda    Reactive airway disease that is not asthma -Continue Flonase and Symbicort     DVT prophylaxis: Lovenox Family Communication: NO "discussed with patient" Disposition Plan:  Came from Home D/C likely to SNF barriers to DC -getting hip surgery on 221 followed by therapy and likely 3 midnight stay for Medicare  requirement  for SNF placement  All the records are reviewed and case discussed with Care Management/Social Worker. Management plans discussed with the patient, family and they are in agreement.  CODE STATUS: Full Code  TOTAL TIME TAKING CARE OF THIS PATIENT: 35 minutes.   More than 50% of the time was spent in counseling/coordination of care: YES  POSSIBLE D/C IN 2-3 DAYS, DEPENDING ON CLINICAL CONDITION.   Max Sane M.D on 07/30/2019 at 11:59 AM  [facility]   CC: Primary care physician; Lavera Guise, MD  Note: This dictation was prepared with Dragon dictation along with smaller phrase technology. Any transcriptional errors that result from this process are unintentional.

## 2019-07-30 NOTE — Anesthesia Procedure Notes (Signed)
Spinal  Patient location during procedure: OR Start time: 07/30/2019 11:48 AM End time: 07/30/2019 11:56 AM Staffing Performed: anesthesiologist and resident/CRNA  Anesthesiologist: Tera Mater, MD Resident/CRNA: Disser, Einar Grad, CRNA Preanesthetic Checklist Completed: patient identified, IV checked, site marked, risks and benefits discussed, surgical consent, monitors and equipment checked, pre-op evaluation and timeout performed Spinal Block Patient position: sitting Prep: Betadine Patient monitoring: heart rate, continuous pulse ox, blood pressure and cardiac monitor Approach: midline Location: L4-5 Injection technique: single-shot Needle Needle type: Whitacre and Introducer  Needle gauge: 24 G Needle length: 9 cm Additional Notes Negative paresthesia. Negative blood return. Positive free-flowing CSF. Expiration date of kit checked and confirmed. Patient tolerated procedure well, without complications.

## 2019-07-30 NOTE — Progress Notes (Signed)
Anticoagulation monitoring(Lovenox):  84yo  female ordered Lovenox 40 mg Q24h  Filed Weights   07/29/19 1431  Weight: 104 lb (47.2 kg)   Body mass index is 18.42 kg/m.    Lab Results  Component Value Date   CREATININE 1.15 (H) 07/29/2019   CREATININE 1.28 (H) 04/24/2019   CREATININE 1.00 03/17/2016   Estimated Creatinine Clearance: 28.6 mL/min (A) (by C-G formula based on SCr of 1.15 mg/dL (H)). Hemoglobin & Hematocrit     Component Value Date/Time   HGB 13.1 07/29/2019 1436   HGB 11.7 (L) 08/12/2012 0450   HCT 39.9 07/29/2019 1436   HCT 35.4 08/12/2012 0450     Per Protocol for Patient with estCrcl < 30 ml/min and BMI < 40, will transition to Lovenox 30 mg Q24h.

## 2019-07-30 NOTE — Progress Notes (Addendum)
Patient with frequency of urine and incontinence. Patient had 3 episodes of large volume incontinence in first 2 hrs on the unit. Patient having difficult time rolling due to pain from hip fracture. Attempted to use the purewick x 2 with no success due to patient small frame. Patient continue to be saturated despite have purewick on. Patient has had recent wounds on her buttocks that are now healed, but has a new small area of breakdown on her sacrum noted on admission. Did not want patient to be at increase risk for breakdown while she is on bedrest awaiting surgery for her hip fracture. Request was made for foley placement.

## 2019-07-30 NOTE — Transfer of Care (Signed)
Immediate Anesthesia Transfer of Care Note  Patient: Jacqueline [name]  Procedure(s) Performed: CANNULATED HIP PINNING (Left Hip)  Patient Location: PACU  Anesthesia Type:Spinal  Level of Consciousness: awake  Airway & Oxygen Therapy: Patient Spontanous Breathing and Patient connected to nasal cannula oxygen  Post-op Assessment: Report given to RN and Post -op Vital signs reviewed and stable  Post vital signs: Reviewed and stable  Last Vitals:  Vitals Value Taken Time  BP 134/67 07/30/19 1250  Temp    Pulse    Resp 11 07/30/19 1252  SpO2    Vitals shown include unvalidated device data.  Last Pain:  Vitals:   07/30/19 0740  TempSrc:   PainSc: 5          Complications: No apparent anesthesia complications

## 2019-07-30 NOTE — Progress Notes (Signed)
PT Cancellation Note  Patient Details Name: Jacqueline [name] MRN: [id number] DOB: 16-Feb-1936   Cancelled Treatment:    Reason Eval/Treat Not Completed: Patient not medically ready;Other (comment)(Patient consult received and reviewed. Patient in room with daughter. Unable to perform evaluation at this time due to patient not having sensation in post op LE. Nursing notified. Will continue to monitor patient and attempt again at later time/date.)  Janna Arch, PT, DPT   07/30/2019, 4:08 PM

## 2019-07-30 NOTE — Op Note (Signed)
07/30/2019  12:47 PM  Patient:   Jacqueline [name]  Pre-Op Diagnosis:   Impacted left femoral neck fracture.  Post-Op Diagnosis:   Same.  Procedure:   In situ cannulated screw fixation of Impacted left femoral neck fracture.  Surgeon:   Pascal Lux, MD  Assistant:   None  Anesthesia:   Spinal  Findings:   As above.  Complications:   None  EBL:   5 cc  Fluids:   500 cc crystalloid  UOP:   250 cc  TT:   None  Drains:   None  Closure:   Staples  Implants:   Biomet 6.5 mm cannulated screws (16 mm) 3  Brief Clinical Note:   The patient is an 84 year old female who fell and injured her left hip 1 week ago. She complains of pain but was able to continue to ambulate for 5 days. However, 2 days ago, her symptoms worsened, making it difficult for her to ambulate. The patient presented to the emergency room where x-rays demonstrated the above-noted findings. The patient has been cleared medically and presents at this time for definitive management of this injury.  Procedure:   The patient was brought into the operating room. After adequate spinal anesthesia was obtained, the patient was lain in the supine position on the fracture table. The uninjured leg was placed in a flexed and abducted position over the well-leg holder while the operative leg was placed in gentle longitudinal traction with some internal rotation. The adequacy of fracture position was verified fluoroscopically in AP and lateral projections before the lateral aspect of the left hip and thigh were prepped with ChloraPrep solution and draped sterilely. Preoperative antibiotics were administered.   An approximately 3-4 cm incision was made over the lateral aspect of the lower part of the greater trochanter as verified fluoroscopically. This incision was carried down through the subcutaneous cutaneous tissues to expose the iliotibial band. This was split the length the incision to expose the lateral aspect of the  proximal femur at the level of the inferior most part of the greater trochanter. Under fluoroscopic guidance, a guide wire was drilled up through the femoral neck along the calcar into the femoral head to rest within 5 mm of subchondral bone. Its position was assessed fluoroscopically in AP and lateral projections and found to be excellent. Two additional guide wires were placed in parallel fashion more proximally, anterior and posterior to the original pin in an inverted triangular configuration. Again the position of these pins was verified fluoroscopically in AP, lateral, and oblique projections and found to be excellent. The length of each of these pins was measured before each pin was overdrilled with the appropriate cannulated drill. Each of these screws was inserted and advanced to within 8-10 mm of subchondral bone. Again the position of each of these screws was assessed fluoroscopically in AP, lateral, and oblique projections, and found to be excellent.  The wound was copiously irrigated with sterile saline solution before the IT band was reapproximated using #0 Vicryl interrupted sutures. The subcutaneous tissues also were closed using 2-0 Vicryl interrupted sutures before the skin was closed using staples. A total of 10 cc of 0.5% Sensorcaine with epinephrine was injected in and around the surgical incision before a sterile occlusive dressing was applied to the wound. The patient was then awakened, transferred back to her hospital bed, and returned to the recovery room in satisfactory condition after tolerating the procedure well.

## 2019-07-30 NOTE — Consult Note (Signed)
ORTHOPAEDIC CONSULTATION  REQUESTING PHYSICIAN: Max Sane, MD  Chief Complaint:   Left hip pain.  History of Present Illness: Jacqueline [name] is a 84 y.o. female with a history of diabetes, dementia, Parkinson's, hypertension, hyperlipidemia, and gastroesophageal reflux disease who lives independently with close supervision from her family and hired care providers.  Apparently, the patient fell about a week ago.  Since then, she has complained of left hip pain, but has been able to ambulate.  However, 2 days ago, she began to complain of increased left hip pain and inability to ambulate.  She was brought to the emergency room yesterday evening where x-rays demonstrated an impacted left femoral neck fracture.  The patient also was noted to have a contusion on the left side of her forehead, so CT scans of her head, face, and neck were obtained which were unremarkable for any acute bony pathology.  The patient denies any loss of consciousness resulting from the fall, and denies any lightheadedness, dizziness, chest pain, shortness of breath, or other symptoms which may have contributed to her fall.  The patient normally ambulates with a rolling walker.  Past Medical History:  Diagnosis Date  . Asthma   . Collapsed lung   . Dementia ([place])   . Diabetes mellitus without complication ([place])    Patient takes Insulin twice a day.   Marland Kitchen GERD (gastroesophageal reflux disease)   . Hyperlipidemia   . Hypertension   . Hypothyroidism   . Parkinson's disease ([place])   . Reactive airway disease    Past Surgical History:  Procedure Laterality Date  . ABDOMINAL HYSTERECTOMY    . CHOLECYSTECTOMY    . COLONOSCOPY WITH PROPOFOL N/A 03/29/2015   Procedure: COLONOSCOPY WITH PROPOFOL;  Surgeon: Hulen Luster, MD;  Location: [facility] ENDOSCOPY;  Service: Gastroenterology;  Laterality: N/A;  . ESOPHAGOGASTRODUODENOSCOPY (EGD) WITH PROPOFOL N/A 03/29/2015    Procedure: ESOPHAGOGASTRODUODENOSCOPY (EGD) WITH PROPOFOL;  Surgeon: Hulen Luster, MD;  Location: [facility] ENDOSCOPY;  Service: Gastroenterology;  Laterality: N/A;  . lung collapse     broken rib from fall punctured lung   Social History   Socioeconomic History  . Marital status: Widowed    Spouse name: Not on file  . Number of children: Not on file  . Years of education: Not on file  . Highest education level: Not on file  Occupational History  . Not on file  Tobacco Use  . Smoking status: Never Smoker  . Smokeless tobacco: Never Used  Substance and Sexual Activity  . Alcohol use: Not Currently  . Drug use: No  . Sexual activity: Not on file  Other Topics Concern  . Not on file  Social History Narrative  . Not on file   Social Determinants of Health   Financial Resource Strain:   . Difficulty of Paying Living Expenses: Not on file  Food Insecurity:   . Worried About Charity fundraiser in the Last Year: Not on file  . Ran Out of Food in the Last Year: Not on file  Transportation Needs:   . Lack of Transportation (Medical): Not on file  . Lack of Transportation (Non-Medical): Not on file  Physical Activity:   . Days of Exercise per Week: Not on file  . Minutes of Exercise per Session: Not on file  Stress:   . Feeling of Stress : Not on file  Social Connections:   . Frequency of Communication with Friends and Family: Not on file  . Frequency of Social  Gatherings with Friends and Family: Not on file  . Attends Religious Services: Not on file  . Active Member of Clubs or Organizations: Not on file  . Attends Archivist Meetings: Not on file  . Marital Status: Not on file   Family History  Problem Relation Age of Onset  . Bladder Cancer Neg Hx   . Kidney cancer Neg Hx    No Known Allergies Prior to Admission medications   Medication Sig Start Date End Date Taking? Authorizing Provider  atorvastatin (LIPITOR) 10 MG tablet Take half tab po qd for high cholesterol  06/20/19  Yes Lavera Guise, MD  carbidopa-levodopa (SINEMET IR) 25-100 MG tablet Take by mouth See admin instructions. Take 1 tablet by mouth at 10 am, take 1/2 tablet by mouth at 1 pm, take 1/2 tablet at 4 pm, take 1/2 tablet at 7 pm. 07/07/16  Yes [provider]  Carbidopa-Levodopa ER (SINEMET CR) 25-100 MG tablet controlled release Take 1 tablet by mouth at bedtime. 04/09/19  Yes [provider]  donepezil (ARICEPT) 10 MG tablet Take 10 mg by mouth at bedtime.   Yes [provider]  fluticasone (FLONASE) 50 MCG/ACT nasal spray Place 1 spray into the nose daily.    Yes [provider]  Insulin Glargine (BASAGLAR KWIKPEN) 100 UNIT/ML SOPN Inject 14 Units into the skin daily.   Yes [provider]  levothyroxine (SYNTHROID, LEVOTHROID) 50 MCG tablet Take 50 mcg by mouth daily before breakfast.   Yes [provider]  memantine (NAMENDA) 10 MG tablet Take 10 mg by mouth 2 (two) times daily.  04/13/17 07/29/19 Yes [provider]  Multiple Vitamin (MULTIVITAMIN WITH MINERALS) TABS tablet Take 1 tablet by mouth daily.   Yes [provider]  omeprazole (PRILOSEC) 40 MG capsule TAKE 1 CAPSULE BY MOUTH EVERY DAY 02/27/19  Yes [place], Audie Clear, NP  rasagiline (AZILECT) 1 MG TABS tablet Take 1 mg by mouth daily.   Yes [provider]  SYMBICORT 80-4.5 MCG/ACT inhaler TAKE 2 PUFFS BY MOUTH TWICE A DAY 05/18/18  Yes Lavera Guise, MD   CT Head Wo Contrast  Result Date: 07/29/2019 CLINICAL DATA:  Fall EXAM: CT HEAD WITHOUT CONTRAST CT MAXILLOFACIAL WITHOUT CONTRAST CT CERVICAL SPINE WITHOUT CONTRAST TECHNIQUE: Multidetector CT imaging of the head, cervical spine, and maxillofacial structures were performed using the standard protocol without intravenous contrast. Multiplanar CT image reconstructions of the cervical spine and maxillofacial structures were also generated. COMPARISON:  06/17/2019 FINDINGS: CT HEAD FINDINGS Brain: There is  no mass, hemorrhage or extra-axial collection. The size and configuration of the ventricles and extra-axial CSF spaces are normal. The brain parenchyma is normal, without evidence of acute or chronic infarction. Vascular: No abnormal hyperdensity of the major intracranial arteries or dural venous sinuses. No intracranial atherosclerosis. Skull: The visualized skull base, calvarium and extracranial soft tissues are normal. CT MAXILLOFACIAL FINDINGS Osseous: --Complex facial fracture types: No LeFort, zygomaticomaxillary complex or nasoorbitoethmoidal fracture. --Simple fracture types: None. --Mandible: No fracture or dislocation. Orbits: The globes are intact. Normal appearance of the intra- and extraconal fat. Symmetric extraocular muscles and optic nerves. Sinuses: Chronic left maxillary sinusitis. Soft tissues: Normal visualized extracranial soft tissues. CT CERVICAL SPINE FINDINGS Alignment: Grade 1 anterolisthesis at C4-5 and C5-6, most likely due to facet arthrosis. Skull base and vertebrae: No acute fracture. Soft tissues and spinal canal: No prevertebral fluid or swelling. No visible canal hematoma. Disc levels: Fusion of the right C2-3 and left C3-4  facets. Severe facet arthrosis at left C4-7. No bony spinal canal stenosis. Upper chest: No pneumothorax, pulmonary nodule or pleural effusion. Other: Normal visualized paraspinal cervical soft tissues. IMPRESSION: 1. No acute intracranial abnormality. 2. No acute fracture or static subluxation of the cervical spine. 3. No facial fracture. Electronically Signed   By: Ulyses Jarred M.D.   On: 07/29/2019 20:32   CT Cervical Spine Wo Contrast  Result Date: 07/29/2019 CLINICAL DATA:  Fall EXAM: CT HEAD WITHOUT CONTRAST CT MAXILLOFACIAL WITHOUT CONTRAST CT CERVICAL SPINE WITHOUT CONTRAST TECHNIQUE: Multidetector CT imaging of the head, cervical spine, and maxillofacial structures were performed using the standard protocol without intravenous contrast. Multiplanar  CT image reconstructions of the cervical spine and maxillofacial structures were also generated. COMPARISON:  06/17/2019 FINDINGS: CT HEAD FINDINGS Brain: There is no mass, hemorrhage or extra-axial collection. The size and configuration of the ventricles and extra-axial CSF spaces are normal. The brain parenchyma is normal, without evidence of acute or chronic infarction. Vascular: No abnormal hyperdensity of the major intracranial arteries or dural venous sinuses. No intracranial atherosclerosis. Skull: The visualized skull base, calvarium and extracranial soft tissues are normal. CT MAXILLOFACIAL FINDINGS Osseous: --Complex facial fracture types: No LeFort, zygomaticomaxillary complex or nasoorbitoethmoidal fracture. --Simple fracture types: None. --Mandible: No fracture or dislocation. Orbits: The globes are intact. Normal appearance of the intra- and extraconal fat. Symmetric extraocular muscles and optic nerves. Sinuses: Chronic left maxillary sinusitis. Soft tissues: Normal visualized extracranial soft tissues. CT CERVICAL SPINE FINDINGS Alignment: Grade 1 anterolisthesis at C4-5 and C5-6, most likely due to facet arthrosis. Skull base and vertebrae: No acute fracture. Soft tissues and spinal canal: No prevertebral fluid or swelling. No visible canal hematoma. Disc levels: Fusion of the right C2-3 and left C3-4 facets. Severe facet arthrosis at left C4-7. No bony spinal canal stenosis. Upper chest: No pneumothorax, pulmonary nodule or pleural effusion. Other: Normal visualized paraspinal cervical soft tissues. IMPRESSION: 1. No acute intracranial abnormality. 2. No acute fracture or static subluxation of the cervical spine. 3. No facial fracture. Electronically Signed   By: Ulyses Jarred M.D.   On: 07/29/2019 20:32   DG HIP UNILAT WITH PELVIS 2-3 VIEWS LEFT  Result Date: 07/29/2019 CLINICAL DATA:  Left hip pain for 3 days since a fall. Initial encounter. EXAM: DG HIP (WITH OR WITHOUT PELVIS) 2-3V LEFT  COMPARISON:  Plain films pelvis right hip 06/17/2019. FINDINGS: There is an acute, mildly impacted subcapital fracture of the left hip. No other acute abnormality. IMPRESSION: Acute subcapital left hip fracture. Electronically Signed   By: Inge Rise M.D.   On: 07/29/2019 16:54   CT Maxillofacial WO CM  Result Date: 07/29/2019 CLINICAL DATA:  Fall EXAM: CT HEAD WITHOUT CONTRAST CT MAXILLOFACIAL WITHOUT CONTRAST CT CERVICAL SPINE WITHOUT CONTRAST TECHNIQUE: Multidetector CT imaging of the head, cervical spine, and maxillofacial structures were performed using the standard protocol without intravenous contrast. Multiplanar CT image reconstructions of the cervical spine and maxillofacial structures were also generated. COMPARISON:  06/17/2019 FINDINGS: CT HEAD FINDINGS Brain: There is no mass, hemorrhage or extra-axial collection. The size and configuration of the ventricles and extra-axial CSF spaces are normal. The brain parenchyma is normal, without evidence of acute or chronic infarction. Vascular: No abnormal hyperdensity of the major intracranial arteries or dural venous sinuses. No intracranial atherosclerosis. Skull: The visualized skull base, calvarium and extracranial soft tissues are normal. CT MAXILLOFACIAL FINDINGS Osseous: --Complex facial fracture types: No LeFort, zygomaticomaxillary complex or nasoorbitoethmoidal fracture. --Simple fracture types: None. --Mandible: No  fracture or dislocation. Orbits: The globes are intact. Normal appearance of the intra- and extraconal fat. Symmetric extraocular muscles and optic nerves. Sinuses: Chronic left maxillary sinusitis. Soft tissues: Normal visualized extracranial soft tissues. CT CERVICAL SPINE FINDINGS Alignment: Grade 1 anterolisthesis at C4-5 and C5-6, most likely due to facet arthrosis. Skull base and vertebrae: No acute fracture. Soft tissues and spinal canal: No prevertebral fluid or swelling. No visible canal hematoma. Disc levels: Fusion of  the right C2-3 and left C3-4 facets. Severe facet arthrosis at left C4-7. No bony spinal canal stenosis. Upper chest: No pneumothorax, pulmonary nodule or pleural effusion. Other: Normal visualized paraspinal cervical soft tissues. IMPRESSION: 1. No acute intracranial abnormality. 2. No acute fracture or static subluxation of the cervical spine. 3. No facial fracture. Electronically Signed   By: Ulyses Jarred M.D.   On: 07/29/2019 20:32    Positive ROS: All other systems have been reviewed and were otherwise negative with the exception of those mentioned in the HPI and as above.  Physical Exam: General:  Alert, no acute distress Psychiatric:  Patient is competent for consent with normal mood and affect   Cardiovascular:  No pedal edema Respiratory:  No wheezing, non-labored breathing GI:  Abdomen is soft and non-tender Skin:  No lesions in the area of chief complaint Neurologic:  Sensation intact distally Lymphatic:  No axillary or cervical lymphadenopathy  Orthopedic Exam:  Orthopedic examination is limited to the left hip and lower extremity.  Leg lengths appear to be equal and of symmetric alignment.  Skin inspection around the left hip is unremarkable.  No swelling, erythema, ecchymosis, abrasions, or other skin abnormalities are identified.  She has minimal tenderness to palpation over the lateral aspect of the hip.  She has more moderate pain with active or passive range of motion of the hip.  She is neurovascularly intact to the left lower extremity and foot, demonstrating the ability to actively dorsiflex and plantarflex her ankle and toes.  Sensation is intact to light touch to all distributions.  She has good capillary refill to her left foot.  X-rays:  X-rays of the pelvis and left hip are available for review and have been reviewed by myself.  These films demonstrate an impacted femoral neck fracture with overall satisfactory alignment.  No significant degenerative changes of the hip  joint are noted.  No lytic lesions or other acute bony pathology is identified.  Assessment: Impacted left femoral neck fracture.  Plan: The treatment options have been discussed with the patient and her daughter, who is at the bedside, including both surgical and nonsurgical choices.  The patient would like to proceed with surgical intervention to include an in situ cannulated screw fixation of the impacted left femoral neck fracture.  This procedure has been discussed in detail with the patient and her daughter, as have the potential risks (including bleeding, infection, nerve and/or blood vessel injury, persistent or recurrent pain, stiffness, malunion and/or nonunion, need for further surgery, blood clots, strokes, heart attacks and arrhythmias, etc.) and benefits.  The patient and her daughter state their understanding and wished to proceed.  A surgical consent has been obtained.  Thank you for asking me to participate in the care of this most delightful woman.  I will be happy to follow her with you.   Pascal Lux, MD  Beeper #:  [phone]  07/30/2019 10:29 AM

## 2019-07-30 NOTE — Progress Notes (Signed)
Daughter Dechanile at bedside with patient when she arrived from ED. Daughter made aware of visiting hours and visitor restrictions. Daughter states her sister [name] will be the designated visitor for this stay.

## 2019-07-31 ENCOUNTER — Inpatient Hospital Stay: Payer: Medicare HMO

## 2019-07-31 ENCOUNTER — Ambulatory Visit: Payer: Medicare HMO | Admitting: Speech Pathology

## 2019-07-31 LAB — CBC
HCT: 37.1 % (ref 36.0–46.0)
Hemoglobin: 12.4 g/dL (ref 12.0–15.0)
MCH: 30.2 pg (ref 26.0–34.0)
MCHC: 33.4 g/dL (ref 30.0–36.0)
MCV: 90.3 fL (ref 80.0–100.0)
Platelets: 177 10*3/uL (ref 150–400)
RBC: 4.11 MIL/uL (ref 3.87–5.11)
RDW: 12.1 % (ref 11.5–15.5)
WBC: 6.2 10*3/uL (ref 4.0–10.5)
nRBC: 0 % (ref 0.0–0.2)

## 2019-07-31 LAB — BASIC METABOLIC PANEL
Anion gap: 7 (ref 5–15)
BUN: 15 mg/dL (ref 8–23)
CO2: 29 mmol/L (ref 22–32)
Calcium: 8.7 mg/dL — ABNORMAL LOW (ref 8.9–10.3)
Chloride: 102 mmol/L (ref 98–111)
Creatinine, Ser: 0.78 mg/dL (ref 0.44–1.00)
GFR calc Af Amer: >60 mL/min (ref >60)
GFR calc non Af Amer: >60 mL/min (ref >60)
Glucose, Bld: 176 mg/dL — ABNORMAL HIGH (ref 70–99)
Potassium: 3.9 mmol/L (ref 3.5–5.1)
Sodium: 138 mmol/L (ref 135–145)

## 2019-07-31 LAB — GLUCOSE, CAPILLARY
Glucose-Capillary: 170 mg/dL — ABNORMAL HIGH (ref 70–99)
Glucose-Capillary: 159 mg/dL — ABNORMAL HIGH (ref 70–99)
Glucose-Capillary: 217 mg/dL — ABNORMAL HIGH (ref 70–99)
Glucose-Capillary: 193 mg/dL — ABNORMAL HIGH (ref 70–99)

## 2019-07-31 MED ORDER — HYDROCODONE-ACETAMINOPHEN 5-325 MG PO TABS: 1.0000 | ORAL_TABLET | Freq: Four times a day (QID) | ORAL | Status: DC | PRN

## 2019-07-31 MED ORDER — ENOXAPARIN SODIUM 40 MG/0.4ML ~~LOC~~ SOLN
40.0000 mg | SUBCUTANEOUS | Status: DC
  Administered 2019-08-01 – 2019-08-03 (×3): 40 mg via SUBCUTANEOUS
  Filled 2019-07-31 (×3): qty 0.4

## 2019-07-31 MED ORDER — GLUCERNA SHAKE PO LIQD
237.0000 mL | Freq: Three times a day (TID) | ORAL | Status: DC
  Administered 2019-07-31 – 2019-08-03 (×7): 237 mL via ORAL

## 2019-07-31 MED ORDER — HYDRALAZINE HCL 20 MG/ML IJ SOLN
10.0000 mg | Freq: Four times a day (QID) | INTRAMUSCULAR | Status: DC | PRN
  Administered 2019-07-31: 10 mg via INTRAVENOUS
  Filled 2019-07-31 (×2): qty 0.5

## 2019-07-31 MED ORDER — HYDRALAZINE HCL 25 MG PO TABS
25.0000 mg | ORAL_TABLET | Freq: Three times a day (TID) | ORAL | Status: DC
  Administered 2019-07-31 – 2019-08-02 (×6): 25 mg via ORAL
  Filled 2019-07-31 (×6): qty 1

## 2019-07-31 NOTE — Evaluation (Signed)
Physical Therapy Evaluation Patient Details Name: Jacqueline [name] MRN: [id number] DOB: Sep 20, 1935 Today's Date: 07/31/2019   History of Present Illness  Jacqueline [name] is an 37yoF who comes to [facility]/20 after 1 week decomposition of gait d/t progressive pain. Pt sustained a fall on 2/13 associated with LOC. XR reveals a left subcapital hip fracture. At baseline pt AMB with RW. PMH: Parkinsonism, dementia, DM, prior syncope, falls. Pt underwent cannulated hip pinning on 2/21. At baseline pt lives alone at home with caregivers inhome 7d/week until afterrnoon. Pt at baselien needs assist with ADL, and AMB household distances with [id number]. Pt reports falling ~1x/3-4 weeks.  Clinical Impression  Pt admitted with above diagnosis. Pt currently with functional limitations due to the deficits listed below (see "PT Problem List"). Upon entry, pt in bed, awake and agreeable to participate. DTR DeChanile at bedside. The pt is alert and oriented x4, pleasant, generally a fair historian. MaxA to EOB, minA to stand both with hand held support or with RW. Pt able to AMB just into hallway and then back into room, less than 14ft. Pt reports to be very weak, and DTR endorses much higher independence with AMB at baseline. Pt requires constant hands-on while AMB. Functional mobility assessment demonstrates increased effort/time requirements, poor tolerance, and need for physical assistance, whereas the patient performed these at a higher level of independence PTA. Pt would require 24/7 caregiver assist for safe DC to home, unclear how she would manage stairs to bed room/bathroom. Author recommending STR to regain strength and independence with mobility prior to return to home, DTR agrees. Pt will benefit from skilled PT intervention to increase independence and safety with basic mobility in preparation for discharge to the venue listed below.       Follow Up Recommendations SNF;Supervision for mobility/OOB;Supervision -  Intermittent    Equipment Recommendations  None recommended by PT    Recommendations for Other Services       Precautions / Restrictions Precautions Precautions: Fall Restrictions LLE Weight Bearing: Weight bearing as tolerated      Mobility  Bed Mobility Overal bed mobility: Needs Assistance Bed Mobility: Supine to Sit     Supine to sit: Max assist        Transfers Overall transfer level: Needs assistance Equipment used: Rolling walker (2 wheeled);1 person hand held assist Transfers: Sit to/from Stand Sit to Stand: Min assist            Ambulation/Gait Ambulation/Gait assistance: Min guard Gait Distance (Feet): 40 Feet Assistive device: Rolling walker (2 wheeled) Gait Pattern/deviations: Ataxic     General Gait Details: instability, but heavily compensated with RW. Unsteadiness much worse than baseline. Urinary incontinence on floor.  Stairs            Wheelchair Mobility    Modified Rankin (Stroke Patients Only)       Balance Overall balance assessment: Needs assistance;History of Falls Sitting-balance support: Single extremity supported;Feet supported Sitting balance-Leahy Scale: Good Sitting balance - Comments: constant writhing from tarfive diskinesia.   Standing balance support: Bilateral upper extremity supported;During functional activity Standing balance-Leahy Scale: Poor                               Pertinent Vitals/Pain Pain Assessment: 0-10 Pain Score: 7  Pain Location: Left hip Pain Descriptors / Indicators: Aching Pain Intervention(s): Limited activity within patient's tolerance;Monitored during session;Premedicated before session;Repositioned    Home Living Family/patient expects to  be discharged to:: Private residence Living Arrangements: Alone Available Help at Discharge: Personal care attendant;Friend(s)(Paid caregivers 6d/week morning to early afternon) Type of Home: House(split level home) Home  Access: Stairs to enter   [employer] of Steps: 1 Home Layout: Multi-level Home Equipment: Walker - 2 wheels;Cane - single point;Walker - 4 wheels;Shower seat;Grab bars - tub/shower Additional Comments: HOB and Legs elevate;    Prior Function Level of Independence: Needs assistance   Gait / Transfers Assistance Needed: typically uses 4ww for all AMB needs; Typically needs help with bed mobility.  ADL's / Homemaking Assistance Needed: Needs help with ADL at baseline, can self feed chopped up meals.  Comments: Has freezing episodes which are made better with sinemet (sinemet causes some writhing or tardive dyskinesia).     Hand Dominance        Extremity/Trunk Assessment                Communication      Cognition Arousal/Alertness: Awake/alert Behavior During Therapy: WFL for tasks assessed/performed Overall Cognitive Status: Within Functional Limits for tasks assessed                                        General Comments      Exercises     Assessment/Plan    PT Assessment Patient needs continued PT services  PT Problem List Decreased strength;Decreased range of motion;Decreased cognition;Decreased activity tolerance;Decreased balance;Decreased mobility       PT Treatment Interventions DME instruction;Gait training;Stair training;Functional mobility training;Therapeutic activities;Therapeutic exercise;Balance training;Patient/family education;Cognitive remediation;Neuromuscular re-education    PT Goals (Current goals can be found in the Care Plan section)  Acute Rehab PT Goals Patient Stated Goal: achieve baseline independence with mobility. PT Goal Formulation: With patient Time For Goal Achievement: 08/14/19 Potential to Achieve Goals: Fair    Frequency BID   Barriers to discharge Inaccessible home environment stairs in home.    Co-evaluation               AM-PAC PT "6 Clicks" Mobility  Outcome Measure Help  needed turning from your back to your side while in a flat bed without using bedrails?: A Lot Help needed moving from lying on your back to sitting on the side of a flat bed without using bedrails?: Total Help needed moving to and from a bed to a chair (including a wheelchair)?: A Little Help needed standing up from a chair using your arms (e.g., wheelchair or bedside chair)?: A Little Help needed to walk in hospital room?: A Little Help needed climbing 3-5 steps with a railing? : A Lot 6 Click Score: 14    End of Session Equipment Utilized During Treatment: Gait belt Activity Tolerance: Patient tolerated treatment well;No increased pain;Patient limited by fatigue Patient left: in chair;with family/visitor present;with chair alarm set Nurse Communication: Mobility status(alarm left off d/t diskinesis; DTR at bedside; pt encouraged to sit fo 45 miuntes.) PT Visit Diagnosis: Unsteadiness on feet (R26.81);Other abnormalities of gait and mobility (R26.89);Difficulty in walking, not elsewhere classified (R26.2);History of falling (Z91.81)    Time: [id number] PT Time Calculation (min) (ACUTE ONLY): 37 min   Charges:   PT Evaluation $PT Eval Moderate Complexity: 1 Mod PT Treatments $Therapeutic Exercise: 8-22 mins       2:44 PM, 07/31/19 Etta Grandchild, PT, DPT Physical Therapist - [facility]  [phone] [facility])    [place]  C 07/31/2019, 2:39 PM

## 2019-07-31 NOTE — Progress Notes (Signed)
Patient c/o feeling nauseated given PRN zofran, Patients tremors seem to be worse at this time. BP 136/119 in right arm . Alert and responsive , Bilateral hand grips weak, PERRLA . Smile is symetrical at this time. She is able to make needs known

## 2019-07-31 NOTE — Progress Notes (Signed)
Initial Nutrition Assessment  DOCUMENTATION CODES:   Underweight  INTERVENTION:  Recommend liberalizing diet to carbohydrate modified.  Provide Glucerna Shake po TID, each supplement provides 220 kcal and 10 grams of protein.  NUTRITION DIAGNOSIS:   Increased nutrient needs related to post-op healing as evidenced by estimated needs.  GOAL:   Patient will meet greater than or equal to 90% of their needs  MONITOR:   PO intake, Supplement acceptance, Labs, Weight trends, I & O's, Skin  REASON FOR ASSESSMENT:   Consult Assessment of nutrition requirement/status  ASSESSMENT:   84 year old female with PMHx of HTN, Parkinson's disease, DM, hypothyroidism, GERD, asthma, HLD, dementia admitted after a fall with closed left hip fracture s/p pinning on 2/21.   Patient was not in her room at time of RD assessment. Per review of chart she may have been down for MRI of brain as she is ordered for one today. Patient's PO intake has been variable (40-100%). She has increased calorie/protein needs for post-operative healing. She would benefit from a liberalized diet and oral nutrition supplements.  According to weight history in chart patient was 52.2 kg on 06/16/2018 and 47.2 kg on 04/24/2019. That is a weight loss of 5 kg (9.6% body weight) over 10 months, which is not significant for time frame. She has remained fairly weight-stable since then.  Medications reviewed and include: Colace 100 mg BID, Novolog 0-9 units TID, Novolog 0-5 units QHS, levothyroxine, MVI daily, pantoprazole, NS at 75 mL/hr.  Labs reviewed: CBG 90-170.  Unable to determine if patient meets criteria for malnutrition at this time.  NUTRITION - FOCUSED PHYSICAL EXAM:  Unable to complete as patient was not in room.  Diet Order:   Diet Order            Diet heart healthy/carb modified Room service appropriate? Yes; Fluid consistency: Thin  Diet effective now             EDUCATION NEEDS:   No education needs  have been identified at this time  Skin:  Skin Assessment: Skin Integrity Issues:(stg II sacrum; closed incision left hip)  Last BM:  07/29/2019 per chart  Height:   Ht Readings from Last 1 Encounters:  07/29/19 5\' 3"  (1.6 m)   Weight:   Wt Readings from Last 1 Encounters:  07/29/19 47.2 kg   Ideal Body Weight:  52.3 kg  BMI:  Body mass index is 18.42 kg/m.  Estimated Nutritional Needs:   Kcal:  1300-1500  Protein:  65-75 grams  Fluid:  1.3-1.5 L/day  Jacklynn Barnacle, MS, RD, LDN Pager number available on Amion

## 2019-07-31 NOTE — Progress Notes (Signed)
PT Cancellation Note  Patient Details Name: MECCA OLAZABAL MRN: [id number] DOB: September 20, 1935   Cancelled Treatment:    Reason Eval/Treat Not Completed: Medical issues which prohibited therapy(Chart reviewed, RN consulted- holding PT at this time due to HTN. 180/15mmHg at 0827, then 196/138mmHg 1000. Will attempt again at later date/time.)  10:19 AM, 07/31/19 Etta Grandchild, PT, DPT Physical Therapist - [facility]  [phone] ([place])    Naliah Eddington C 07/31/2019, 10:19 AM

## 2019-07-31 NOTE — Progress Notes (Signed)
Patient appears to be feeling much better than this morning, no further episode of feeling nauseated.

## 2019-07-31 NOTE — Progress Notes (Signed)
  Subjective: 1 Day Post-Op Procedure(s) (LRB): CANNULATED HIP PINNING (Left) Patient reports pain as mild.   Patient is well but is having issues with hypertension PT and care management to assist with discharge planning. Negative for chest pain and shortness of breath Fever: no Gastrointestinal:Negative for nausea and vomiting  Objective: Vital signs in last 24 hours: Temp:  [97.5 F (36.4 C)-98.6 F (37 C)] 97.8 F (36.6 C) (02/22 1046) Pulse Rate:  [67-87] 83 (02/22 1046) Resp:  [10-18] 16 (02/22 0827) BP: (136-196)/(77-132) 140/99 (02/22 1046) SpO2:  [93 %-100 %] 93 % (02/22 0827)  Intake/Output from previous day:  Intake/Output Summary (Last 24 hours) at 07/31/2019 1254 Last data filed at 07/31/2019 0900 Gross per 24 hour  Intake 1782.5 ml  Output 2900 ml  Net -1117.5 ml    Intake/Output this shift: Total I/O In: 120 [P.O.:120] Out: -   Labs: Recent Labs    07/29/19 1436 07/31/19 0346  HGB 13.1 12.4   Recent Labs    07/29/19 1436 07/31/19 0346  WBC 5.1 6.2  RBC 4.36 4.11  HCT 39.9 37.1  PLT 187 177   Recent Labs    07/29/19 1436 07/31/19 0346  NA 132* 138  K 4.5 3.9  CL 95* 102  CO2 29 29  BUN 26* 15  CREATININE 1.15* 0.78  GLUCOSE 479* 176*  CALCIUM 9.6 8.7*   No results for input(s): LABPT, INR in the last 72 hours.   EXAM General - Patient is Alert and Appropriate Extremity - ABD soft Sensation intact distally Intact pulses distally Dorsiflexion/Plantar flexion intact Incision: dressing C/D/I No cellulitis present Dressing/Incision - clean, dry, no drainage Motor Function - intact, moving foot and toes well on exam.  Past Medical History:  Diagnosis Date  . Asthma   . Collapsed lung   . Dementia ([place])   . Diabetes mellitus without complication ([place])    Patient takes Insulin twice a day.   Marland Kitchen GERD (gastroesophageal reflux disease)   . Hyperlipidemia   . Hypertension   . Hypothyroidism   . Parkinson's disease ([place])   .  Reactive airway disease     Assessment/Plan: 1 Day Post-Op Procedure(s) (LRB): CANNULATED HIP PINNING (Left) Principal Problem:   Closed left hip fracture (HCC) Active Problems:   Type 2 diabetes mellitus with stage 3 chronic kidney disease (HCC)   Difficulty walking   Essential hypertension   Parkinson's disease ([place])   Fall at home, initial encounter   Acquired hypothyroidism   Dementia due to Parkinson's disease without behavioral disturbance (HCC)   Reactive airway disease that is not asthma   Frequent falls   Pressure injury of skin  Estimated body mass index is 18.42 kg/m as calculated from the following:   Height as of this encounter: 5\' 3"  (1.6 m).   Weight as of this encounter: 47.2 kg. Advance diet Up with therapy   Labs reviewed this AM. WBC 6.2, Hg 12.4 this AM. Up with therapy today when tolerated. Will likely need SNF following discharge.  DVT Prophylaxis - Lovenox, Foot Pumps and TED hose Weight-Bearing as tolerated to left leg  J. Cameron Proud, PA-C [facility] 07/31/2019, 12:54 PM

## 2019-07-31 NOTE — Progress Notes (Signed)
Anticoagulation monitoring(Lovenox):  84yo  female ordered Lovenox 30 mg Q24h  Filed Weights   07/29/19 1431  Weight: 104 lb (47.2 kg)   Body mass index is 18.42 kg/m.    Lab Results  Component Value Date   CREATININE 0.78 07/31/2019   CREATININE 1.15 (H) 07/29/2019   CREATININE 1.28 (H) 04/24/2019   Estimated Creatinine Clearance: 41.1 mL/min (by C-G formula based on SCr of 0.78 mg/dL). Hemoglobin & Hematocrit     Component Value Date/Time   HGB 12.4 07/31/2019 0346   HGB 11.7 (L) 08/12/2012 0450   HCT 37.1 07/31/2019 0346   HCT 35.4 08/12/2012 0450   Patient's Scr has improved. CrCl now >27ml/min   Per Protocol for Patient with estCrcl > 30 ml/min and BMI < 40, will transition to Lovenox 40 mg Q24h.     Pernell Dupre, PharmD, BCPS Clinical Pharmacist 07/31/2019 8:54 AM

## 2019-07-31 NOTE — Progress Notes (Signed)
[place] [facility] [place] NAME: Jacqueline [name]    MR#:  [id number]  DATE OF BIRTH:  03/24/1936  SUBJECTIVE:  CHIEF COMPLAINT:  No chief complaint on file. Blood pressure elevated earlier but now improved.  Reports nausea, minimal pain at the surgery site REVIEW OF SYSTEMS:  Review of Systems  Constitutional: Negative for diaphoresis, fever, malaise/fatigue and weight loss.  HENT: Negative for ear discharge, ear pain, hearing loss, nosebleeds, sore throat and tinnitus.   Eyes: Negative for blurred vision and pain.  Respiratory: Negative for cough, hemoptysis, shortness of breath and wheezing.   Cardiovascular: Negative for chest pain, palpitations, orthopnea and leg swelling.  Gastrointestinal: Negative for abdominal pain, blood in stool, constipation, diarrhea, heartburn, nausea and vomiting.  Genitourinary: Negative for dysuria, frequency and urgency.  Musculoskeletal: Positive for falls and joint pain. Negative for back pain and myalgias.  Skin: Negative for itching and rash.  Neurological: Negative for dizziness, tingling, tremors, focal weakness, seizures, weakness and headaches.  Psychiatric/Behavioral: Negative for depression. The patient is not nervous/anxious.    DRUG ALLERGIES:  No Known Allergies VITALS:  Blood pressure (!) 140/99, pulse 83, temperature 97.8 F (36.6 C), temperature source Oral, resp. rate 16, height 5\' 3"  (1.6 m), weight 47.2 kg, SpO2 93 %. PHYSICAL EXAMINATION:  Physical Exam HENT:     Head: Normocephalic and atraumatic.     Comments: Waning bruise on right side of face involving infraorbital and nasolabia area Eyes:     Conjunctiva/sclera: Conjunctivae normal.     Pupils: Pupils are equal, round, and reactive to light.  Neck:     Thyroid: No thyromegaly.     Trachea: No tracheal deviation.  Cardiovascular:     Rate and Rhythm: Normal rate and regular rhythm.     Heart sounds: Normal heart sounds.  Pulmonary:     Effort:  Pulmonary effort is normal. No respiratory distress.     Breath sounds: Normal breath sounds. No wheezing.  Chest:     Chest wall: No tenderness.  Abdominal:     General: Bowel sounds are normal. There is no distension.     Palpations: Abdomen is soft.     Tenderness: There is no abdominal tenderness.  Musculoskeletal:        General: Normal range of motion.     Cervical back: Normal range of motion and neck supple.     Left lower leg: Tenderness present.       Legs:  Skin:    General: Skin is warm and dry.     Findings: No rash.  Neurological:     Mental Status: She is alert and oriented to person, place, and time.     Cranial Nerves: No cranial nerve deficit.    LABORATORY PANEL:  Female CBC Recent Labs  Lab 07/31/19 0346  WBC 6.2  HGB 12.4  HCT 37.1  PLT 177   ------------------------------------------------------------------------------------------------------------------ Chemistries  Recent Labs  Lab 07/31/19 0346  NA 138  K 3.9  CL 102  CO2 29  GLUCOSE 176*  BUN 15  CREATININE 0.78  CALCIUM 8.7*   RADIOLOGY:  MR BRAIN WO CONTRAST  Result Date: 07/31/2019 CLINICAL DATA:  Encephalopathy.  Rule out stroke. EXAM: MRI HEAD WITHOUT CONTRAST TECHNIQUE: Multiplanar, multiecho pulse sequences of the brain and surrounding structures were obtained without intravenous contrast. COMPARISON:  CT head 07/29/2019 FINDINGS: Brain: Motion degraded study.  Allowing for this, no acute infarct Generalized atrophy. Chronic microvascular ischemic changes in the white matter.  Negative for hemorrhage mass or fluid collection. Vascular: Normal arterial flow voids Skull and upper cervical spine: Negative Sinuses/Orbits: Mucosal thickening left maxillary sinus. Bilateral cataract extraction Other: None IMPRESSION: Motion degraded study.  No acute abnormality. Electronically Signed   By: Franchot Gallo M.D.   On: 07/31/2019 13:30   ASSESSMENT AND PLAN:  Jacqueline [name] is a 84 y.o. female  with medical history significant for Parkinson's disease, with associated dyskinesia, ambulant with a cane at baseline with history of falls, history of Parkinson's dementia, type 2 diabetes, CKD 3, hyperreactive airway on Symbicort and hypothyroidism who presented to the emergency room with persistent left hip pain following a fall on 07/22/2019, associated with decreased ability to ambulate  Closed left hip fracture ([place])   Fall at home, accidental, initial encounter   Difficulty walking with frequent falls   Parkinson's disease ([place]) -Acute subcapital left hip fracture s/p pinning on 2/21 by orthopedics --CT, MRI head and CT C-spine -> no acute patho -Pain management  Nausea -Likely from narcotics.  As needed Zofran  Hyperglycemia, type 2 diabetes mellitus with complications, stage 3 chronic kidney disease (HCC) -Sugar was 479 on admission, hemoglobin A1c 7.4 --Hold home diabetic medication regimen -Sliding scale insulin coverage  Facial bruise -appears stable and improving --CT maxillofacial - no acute patho -Compresses, warm cold as needed for comfort.  Acquired hypothyroidism -Continue home levothyroxine    Dementia due to Parkinson's disease without behavioral disturbance (HCC)  -Continue Aricept and Namenda    Reactive airway disease that is not asthma -Continue Flonase and Symbicort  Overall poor prognosis.  Palliative care consult pending   DVT prophylaxis: Lovenox Family Communication: NO "discussed with patient" Disposition Plan:  Came from Home D/C likely to SNF Barriers to DC -await clinical improvement, palliative care consultation and likely 3 midnight stay for Medicare requirement for SNF placement  All the records are reviewed and case discussed with Care Management/Social Worker. Management plans discussed with the patient, nursing and they are in agreement.  CODE STATUS: Full Code  TOTAL TIME TAKING CARE OF THIS PATIENT: 35 minutes.   More than  50% of the time was spent in counseling/coordination of care: YES  POSSIBLE D/C IN 1-2 DAYS, DEPENDING ON CLINICAL CONDITION.   Max Sane M.D on 07/31/2019 at 1:43 PM  [facility]   CC: Primary care physician; Lavera Guise, MD  Note: This dictation was prepared with Dragon dictation along with smaller phrase technology. Any transcriptional errors that result from this process are unintentional.

## 2019-07-31 NOTE — Anesthesia Postprocedure Evaluation (Signed)
Anesthesia Post Note  Patient: Jacqueline [name]  Procedure(s) Performed: CANNULATED HIP PINNING (Left Hip)  Patient location during evaluation: Nursing Unit Anesthesia Type: Spinal Level of consciousness: oriented and awake and alert Pain management: pain level controlled Vital Signs Assessment: post-procedure vital signs reviewed and stable Respiratory status: spontaneous breathing, respiratory function stable and patient connected to nasal cannula oxygen Cardiovascular status: blood pressure returned to baseline and stable Postop Assessment: no headache, no backache and no apparent nausea or vomiting Anesthetic complications: no     Last Vitals:  Vitals:   07/31/19 0124 07/31/19 0541  BP: (!) 161/94 (!) 155/83  Pulse: 86 78  Resp: 16 16  Temp: 37 C 36.8 C  SpO2: 96% 93%    Last Pain:  Vitals:   07/31/19 0541  TempSrc: Oral  PainSc:                  [name]

## 2019-08-01 DIAGNOSIS — Z7189 Other specified counseling: Secondary | ICD-10-CM

## 2019-08-01 DIAGNOSIS — Z515 Encounter for palliative care: Secondary | ICD-10-CM

## 2019-08-01 LAB — GLUCOSE, CAPILLARY
Glucose-Capillary: 247 mg/dL — ABNORMAL HIGH (ref 70–99)
Glucose-Capillary: 158 mg/dL — ABNORMAL HIGH (ref 70–99)
Glucose-Capillary: 190 mg/dL — ABNORMAL HIGH (ref 70–99)
Glucose-Capillary: 236 mg/dL — ABNORMAL HIGH (ref 70–99)
Glucose-Capillary: 180 mg/dL — ABNORMAL HIGH (ref 70–99)
Glucose-Capillary: 182 mg/dL — ABNORMAL HIGH (ref 70–99)

## 2019-08-01 LAB — URINALYSIS, COMPLETE (UACMP) WITH MICROSCOPIC
Bilirubin Urine: NEGATIVE
Glucose, UA: NEGATIVE mg/dL
Ketones, ur: NEGATIVE mg/dL
Leukocytes,Ua: NEGATIVE
Nitrite: NEGATIVE
Protein, ur: 100 mg/dL — AB
RBC / HPF: >50 RBC/hpf — ABNORMAL HIGH (ref 0–5)
Specific Gravity, Urine: 1.004 — ABNORMAL LOW (ref 1.005–1.030)
Squamous Epithelial / HPF: NONE SEEN (ref 0–5)
pH: 6 (ref 5.0–8.0)

## 2019-08-01 LAB — CBC
HCT: 36.2 % (ref 36.0–46.0)
Hemoglobin: 12.3 g/dL (ref 12.0–15.0)
MCH: 30.2 pg (ref 26.0–34.0)
MCHC: 34 g/dL (ref 30.0–36.0)
MCV: 88.9 fL (ref 80.0–100.0)
Platelets: 180 10*3/uL (ref 150–400)
RBC: 4.07 MIL/uL (ref 3.87–5.11)
RDW: 12.4 % (ref 11.5–15.5)
WBC: 5.6 10*3/uL (ref 4.0–10.5)
nRBC: 0 % (ref 0.0–0.2)

## 2019-08-01 MED ORDER — ENOXAPARIN SODIUM 40 MG/0.4ML ~~LOC~~ SOLN: 40.0000 mg | SUBCUTANEOUS | 0 refills | Status: DC

## 2019-08-01 MED ORDER — HYDROCODONE-ACETAMINOPHEN 5-325 MG PO TABS: 1.0000 | ORAL_TABLET | Freq: Four times a day (QID) | ORAL | 0 refills | Status: DC | PRN

## 2019-08-01 NOTE — Progress Notes (Signed)
Physical Therapy Treatment Patient Details Name: Jacqueline [name] MRN: [id number] DOB: [birth year] Today's Date: 08/01/2019    History of Present Illness Jacqueline [name] is an 70yoF who comes [facility] 2/20 after 1 week decomposition of gait d/t progressive pain. Pt sustained a fall on 2/13 associated with LOC. XR reveals a left subcapital hip fracture. At baseline pt AMB with RW. PMH: Parkinsonism, dementia, DM, prior syncope, falls. Pt underwent cannulated hip pinning on 2/21. At baseline pt lives alone at home with caregivers inhome 7d/week until afterrnoon. Pt at baselien needs assist with ADL, and AMB household distances with [id number]. Pt reports falling ~1x/3-4 weeks.    PT Comments    Author returning for 2nd treatment this date. DTR at bedside reports pt tolerated time up in recliner well for ~45 minutes, no significant sliding out. Pt now s/p sinemet dose, less freezing more interactive, but return of writhing movements/chorea. Pt has greater strength with mobility, but less control, more LOB while walking. DTR reports these issues as uncharacteristic of pt's baseline wherein she is able to remain steady with RW at baseline. Author decides to defer stairs training due to continued ataxia and weakness. Pt able to tolerate 2 37ft AMB intervals with a seated rest interval of about 3 minutes.   Follow Up Recommendations  SNF;Supervision for mobility/OOB;Supervision - Intermittent     Equipment Recommendations  None recommended by PT    Recommendations for Other Services       Precautions / Restrictions Precautions Precautions: Fall Restrictions LLE Weight Bearing: Weight bearing as tolerated    Mobility  Bed Mobility Overal bed mobility: Needs Assistance Bed Mobility: Supine to Sit;Rolling;Sit to Supine Rolling: Mod assist   Supine to sit: Max assist Sit to supine: Min guard   General bed mobility comments: assisted with rolling for diaper donning.  Transfers Overall transfer  level: Needs assistance Equipment used: Rolling walker (2 wheeled);1 person hand held assist Transfers: Sit to/from Stand Sit to Stand: Mod assist         General transfer comment: 2x in session, LOB with each  Ambulation/Gait Ambulation/Gait assistance: Min guard Gait Distance (Feet): 80 Feet(49ft, recovery, 40ft) Assistive device: Rolling walker (2 wheeled) Gait Pattern/deviations: Scissoring;Ataxic;Staggering left;Staggering right     General Gait Details: No freezing difficulty, limited control of balance and RW, a few LOB requiring author for assisted correction.   Stairs             Wheelchair Mobility    Modified Rankin (Stroke Patients Only)       Balance Overall balance assessment: Needs assistance;History of Falls Sitting-balance support: Single extremity supported;Feet supported Sitting balance-Leahy Scale: Good Sitting balance - Comments: constant writhing from tarfive diskinesia.   Standing balance support: Bilateral upper extremity supported;During functional activity Standing balance-Leahy Scale: Poor                              Cognition Arousal/Alertness: Awake/alert Behavior During Therapy: WFL for tasks assessed/performed Overall Cognitive Status: History of cognitive impairments - at baseline                                        Exercises Total Joint Exercises Long Arc Quad: AAROM;Right;10 reps;Seated;Limitations Long [employer] Limitations: attempted in seated position in recliner, non opperative leg; pt unable to produce isolated Rt knee joint movements, result is  total leg and often trunk athetoid movements with poor differentiation.    General Comments        Pertinent Vitals/Pain Pain Assessment: Faces Pain Score: 6 (reports better since getting tylenol) Faces Pain Scale: Hurts a little bit Pain Location: Left hip Pain Descriptors / Indicators: Aching;Sore Pain Intervention(s): Limited activity  within patient's tolerance;Repositioned    Home Living                      Prior Function            PT Goals (current goals can now be found in the care plan section) Acute Rehab PT Goals Patient Stated Goal: achieve baseline independence with mobility. PT Goal Formulation: With patient Time For Goal Achievement: 08/14/19 Potential to Achieve Goals: Fair Progress towards PT goals: PT to reassess next treatment    Frequency    BID      PT Plan Current plan remains appropriate    Co-evaluation              AM-PAC PT "6 Clicks" Mobility   Outcome Measure  Help needed turning from your back to your side while in a flat bed without using bedrails?: A Lot Help needed moving from lying on your back to sitting on the side of a flat bed without using bedrails?: Total Help needed moving to and from a bed to a chair (including a wheelchair)?: A Lot Help needed standing up from a chair using your arms (e.g., wheelchair or bedside chair)?: A Lot Help needed to walk in hospital room?: A Little Help needed climbing 3-5 steps with a railing? : A Lot 6 Click Score: 12    End of Session Equipment Utilized During Treatment: Gait belt Activity Tolerance: Patient limited by fatigue;Patient limited by lethargy Patient left: with call bell/phone within reach;with SCD's reapplied;in bed;with family/visitor present Nurse Communication: Mobility status PT Visit Diagnosis: Unsteadiness on feet (R26.81);Other abnormalities of gait and mobility (R26.89);Difficulty in walking, not elsewhere classified (R26.2);History of falling (Z91.81)     Time: [id number] PT Time Calculation (min) (ACUTE ONLY): 18 min  Charges:  $Gait Training: 8-22 mins                     1:29 PM, 08/01/19 Etta Grandchild, PT, DPT Physical Therapist - [facility]  [phone] ([place])    Torrion Witter C 08/01/2019, 1:26 PM

## 2019-08-01 NOTE — Progress Notes (Signed)
Patient and her daughter in room , asked for assistance to get patient OOB , patient noted to have her back wet. Her VSS 98.2-148/73-78-17-95 RA. [place] under patient completely soiled and appeared to be blood in urine. Patients BS is stable at 180mg /dl  Notified Dr. Manuella Ghazi of change

## 2019-08-01 NOTE — TOC Initial Note (Signed)
Transition of Care [facility]) - Initial/Assessment Note    Patient Details  Name: Jacqueline [name] MRN: [id number] Date of Birth: 1936-04-02  Transition of Care [facility]) CM/SW Contact:    Su Hilt, RN Phone Number: 08/01/2019, 9:44 AM  Clinical Narrative:       Met with the patient at the bedside.  She lives alone ina split level home with stairs to lead into other rooms, She has a paid caregiver 6 days a week from 9 AM til 2 PM, the seventh day one of her daughters  Stays with her.  She is agreeable to SNF and agrees to a bed search,  We will review bed offers once obtained           Expected Discharge Plan: [place] Barriers to Discharge: Ship broker, Continued Medical Work up, SNF Pending bed offer   Patient Goals and CMS Choice Patient states their goals for this hospitalization and ongoing recovery are:: get stronger      Expected Discharge Plan and Services Expected Discharge Plan: [place]   Discharge Planning Services: CM Consult   Living arrangements for the past 2 months: Single Family Home                                      Prior Living Arrangements/Services Living arrangements for the past 2 months: Single Family Home Lives with:: Self(has a paid caregiver from 9-2 daily) Patient language and need for interpreter reviewed:: Yes Do you feel safe going back to the place where you live?: Yes      Need for Family Participation in Patient Care: No (Comment) Care giver support system in place?: Yes (comment) Current home services: DME(RW, Rollator, Cane, Shower seat, grab bars) Criminal Activity/Legal Involvement Pertinent to Current Situation/Hospitalization: No - Comment as needed  Activities of Daily Living Home Assistive Devices/Equipment: Environmental consultant (specify type) ADL Screening (condition at time of admission) Patient's cognitive ability adequate to safely complete daily activities?: Yes Is the patient deaf or  have difficulty hearing?: No Does the patient have difficulty seeing, even when wearing glasses/contacts?: No Does the patient have difficulty concentrating, remembering, or making decisions?: Yes Patient able to express need for assistance with ADLs?: Yes Does the patient have difficulty dressing or bathing?: Yes Independently performs ADLs?: No Communication: Independent Dressing (OT): Needs assistance Is this a change from baseline?: Pre-admission baseline Grooming: Independent Feeding: Independent Bathing: Needs assistance Is this a change from baseline?: Pre-admission baseline Toileting: Needs assistance Is this a change from baseline?: Pre-admission baseline In/Out Bed: Needs assistance Is this a change from baseline?: Change from baseline, expected to last >3 days Walks in Home: Needs assistance Is this a change from baseline?: Change from baseline, expected to last >3 days Does the patient have difficulty walking or climbing stairs?: Yes Weakness of Legs: Both Weakness of Arms/Hands: Both  Permission Sought/Granted   Permission granted to share information with : Yes, Verbal Permission Granted              Emotional Assessment Appearance:: Appears stated age Attitude/Demeanor/Rapport: Engaged Affect (typically observed): Appropriate Orientation: : Oriented to Self, Oriented to Situation, Oriented to Place, Oriented to  Time Alcohol / Substance Use: Not Applicable Psych Involvement: No (comment)  Admission diagnosis:  Fall [W19.XXXA] Closed left hip fracture ([place]) [S72.002A] Closed fracture of left hip, initial encounter ([place]) [S72.002A] Fall, initial encounter [W19.XXXA] Syncope, unspecified syncope type [  R55] Patient Active Problem List   Diagnosis Date Noted  . Pressure injury of skin 07/30/2019  . Closed left hip fracture ([place]) 07/29/2019  . Fall at home, initial encounter 07/29/2019  . Acquired hypothyroidism 07/29/2019  . Dementia due to Parkinson's  disease without behavioral disturbance ([place]) 07/29/2019  . Reactive airway disease that is not asthma 07/29/2019  . Frequent falls 07/29/2019  . Pressure injury of skin of sacral region 06/12/2019  . Wound healing, delayed 06/12/2019  . Encounter for general adult medical examination with abnormal findings 06/21/2018  . Uncontrolled type 2 diabetes mellitus with hyperglycemia ([place]) 06/21/2018  . Asthma without status asthmaticus 07/08/2016  . Hyperlipidemia, unspecified 07/08/2016  . Dysphagia 01/07/2016  . Unsteadiness 10/01/2015  . Type 2 diabetes mellitus without complication, without long-term current use of insulin ([place]) 08/07/2015  . Mixed Alzheimer's and vascular dementia ([place]) 08/06/2015  . Controlled type 2 diabetes mellitus without complication, without long-term current use of insulin ([place]) 02/12/2015  . Type 2 diabetes mellitus with stage 3 chronic kidney disease ([place]) 06/19/2014  . Essential hypertension 03/08/2014  . Parkinson's disease ([place]) 01/16/2014  . Fatigue 11/09/2013  . Diabetes mellitus, type II ([place]) 09/15/2013  . Difficulty walking 09/15/2013  . Dizziness 09/15/2013   PCP:  Lavera Guise, MD Pharmacy:   [id number] [place] [id number]: [phone]: [phone]    Social Determinants of Health (SDOH) Interventions    Readmission Risk Interventions No flowsheet data found.

## 2019-08-01 NOTE — Progress Notes (Signed)
  Subjective: 2 Days Post-Op Procedure(s) (LRB): CANNULATED HIP PINNING (Left) Patient reports pain as mild.   Patient is well but drowsy this AM.  Became more responsive as exam continued. Plan for discharge to SNF when appropriate. Negative for chest pain and shortness of breath Fever: no Gastrointestinal:Negative for nausea and vomiting  Objective: Vital signs in last 24 hours: Temp:  [97.8 F (36.6 C)-98.6 F (37 C)] 98.4 F (36.9 C) (02/23 0825) Pulse Rate:  [82-88] 82 (02/23 0825) Resp:  [17-20] 17 (02/23 0825) BP: (136-196)/(60-132) 156/72 (02/23 0825) SpO2:  [94 %-96 %] 95 % (02/23 0825)  Intake/Output from previous day:  Intake/Output Summary (Last 24 hours) at 08/01/2019 0958 Last data filed at 08/01/2019 [id number] Gross per 24 hour  Intake 360 ml  Output --  Net 360 ml    Intake/Output this shift: Total I/O In: 240 [P.O.:240] Out: -   Labs: Recent Labs    07/29/19 1436 07/31/19 0346 08/01/19 0449  HGB 13.1 12.4 12.3   Recent Labs    07/31/19 0346 08/01/19 0449  WBC 6.2 5.6  RBC 4.11 4.07  HCT 37.1 36.2  PLT 177 180   Recent Labs    07/29/19 1436 07/31/19 0346  NA 132* 138  K 4.5 3.9  CL 95* 102  CO2 29 29  BUN 26* 15  CREATININE 1.15* 0.78  GLUCOSE 479* 176*  CALCIUM 9.6 8.7*   No results for input(s): LABPT, INR in the last 72 hours.   EXAM General - Patient is Alert and Appropriate Extremity - ABD soft Sensation intact distally Intact pulses distally Dorsiflexion/Plantar flexion intact Incision: dressing C/D/I No cellulitis present Dressing/Incision - clean, dry, no drainage Motor Function - intact, moving foot and toes well on exam.  Past Medical History:  Diagnosis Date  . Asthma   . Collapsed lung   . Dementia ([place])   . Diabetes mellitus without complication ([place])    Patient takes Insulin twice a day.   Marland Kitchen GERD (gastroesophageal reflux disease)   . Hyperlipidemia   . Hypertension   . Hypothyroidism   . Parkinson's  disease ([place])   . Reactive airway disease     Assessment/Plan: 2 Days Post-Op Procedure(s) (LRB): CANNULATED HIP PINNING (Left) Principal Problem:   Closed left hip fracture (HCC) Active Problems:   Type 2 diabetes mellitus with stage 3 chronic kidney disease (HCC)   Difficulty walking   Essential hypertension   Parkinson's disease ([place])   Fall at home, initial encounter   Acquired hypothyroidism   Dementia due to Parkinson's disease without behavioral disturbance (HCC)   Reactive airway disease that is not asthma   Frequent falls   Pressure injury of skin  Estimated body mass index is 18.42 kg/m as calculated from the following:   Height as of this encounter: 5\' 3"  (1.6 m).   Weight as of this encounter: 47.2 kg. Advance diet Up with therapy   Labs reviewed this AM. WBC 5.6, Hg 12.3 this AM. Up with therapy today when tolerated. Plan for d/c to SNF when appropriate.  Upon discharge from hospital, follow-up with [facility] in two weeks for staple removal and x-rays. Lovenox 40mg  daily for 14 days following surgery for DVT prevention.  DVT Prophylaxis - Lovenox, Foot Pumps and TED hose Weight-Bearing as tolerated to left leg  J. Cameron Proud, PA-C [facility] Orthopaedic Surgery 08/01/2019, 9:58 AM

## 2019-08-01 NOTE — Progress Notes (Signed)
Physical Therapy Treatment Patient Details Name: Jacqueline [name] MRN: [id number] DOB: Feb 28, 1936 Today's Date: 08/01/2019    History of Present Illness Jacqueline [name] is an 24yoF who [facility] after 1 week decomposition of gait d/t progressive pain. Pt sustained a fall on 2/13 associated with LOC. XR reveals a left subcapital hip fracture. At baseline pt AMB with RW. PMH: Parkinsonism, dementia, DM, prior syncope, falls. Pt underwent cannulated hip pinning on 2/21. At baseline pt lives alone at home with caregivers inhome 7d/week until afterrnoon. Pt at baselien needs assist with ADL, and AMB household distances with [id number]. Pt reports falling ~1x/3-4 weeks.    PT Comments    Pt sleeping heavily upon entry, lights out, made awoke with gentle stroking or right hand. Pt recognizes author from previous day. Pt not yet had meds this morning with obvious limitations in movement disorder, more hypophonia, more freezing, some festination. Pt has less resting chorea/dyskinesia at rest, but is unable to accurately produce isolated movements for exercise without dyskinesis dominance. MaxA to EOB where pt is able to sit independently, albeit drowsy. Mod-maxA for 4 transfers in session, >25sec to establish unassisted standing with RW. Pt tolerates 38ft AMB initially with intermittent freezing episodes, then after 5 minute recovery, performs a 2nd bout of 44ft with more fatigue and freezing. Pt made comfortable in recliner at end of session, RN in room with meds, RNCM in room to discuss DC recommendations.     Follow Up Recommendations  SNF;Supervision for mobility/OOB;Supervision - Intermittent     Equipment Recommendations  None recommended by PT    Recommendations for Other Services       Precautions / Restrictions Precautions Precautions: Fall Restrictions LLE Weight Bearing: Weight bearing as tolerated    Mobility  Bed Mobility Overal bed mobility: Needs Assistance Bed Mobility: Supine  to Sit;Rolling Rolling: Mod assist   Supine to sit: Max assist     General bed mobility comments: assisted with rolling for diaper donning.  Transfers Overall transfer level: Needs assistance Equipment used: Rolling walker (2 wheeled);1 person hand held assist Transfers: Sit to/from Stand Sit to Stand: Mod assist         General transfer comment: more time to establish against gravity  Ambulation/Gait Ambulation/Gait assistance: Min guard Gait Distance (Feet): 80 Feet(36ft, then 34ft after 5 minutes rest break.) Assistive device: Rolling walker (2 wheeled) Gait Pattern/deviations: Festinating;Scissoring;Ataxic     General Gait Details: more freezing issues this session (off cycle)   Stairs             Wheelchair Mobility    Modified Rankin (Stroke Patients Only)       Balance Overall balance assessment: Needs assistance;History of Falls Sitting-balance support: Single extremity supported;Feet supported Sitting balance-Leahy Scale: Good Sitting balance - Comments: constant writhing from tarfive diskinesia.   Standing balance support: Bilateral upper extremity supported;During functional activity Standing balance-Leahy Scale: Poor                              Cognition Arousal/Alertness: Awake/alert Behavior During Therapy: WFL for tasks assessed/performed Overall Cognitive Status: Within Functional Limits for tasks assessed                                        Exercises Total Joint Exercises Long Arc Quad: AAROM;Right;10 reps;Seated;Limitations Long [employer] Limitations: attempted  in seated position in recliner, non opperative leg; pt unable to produce isolated Rt knee joint movements, result is total leg and often trunk athetoid movements with poor differentiation.    General Comments        Pertinent Vitals/Pain Pain Assessment: No/denies pain Pain Score: 6 (reports better since getting tylenol) Pain Location:  Left hip Pain Descriptors / Indicators: Aching Pain Intervention(s): Limited activity within patient's tolerance    Home Living                      Prior Function            PT Goals (current goals can now be found in the care plan section) Acute Rehab PT Goals Patient Stated Goal: achieve baseline independence with mobility. PT Goal Formulation: With patient Time For Goal Achievement: 08/14/19 Potential to Achieve Goals: Fair Progress towards PT goals: Progressing toward goals    Frequency    BID      PT Plan Current plan remains appropriate    Co-evaluation              AM-PAC PT "6 Clicks" Mobility   Outcome Measure  Help needed turning from your back to your side while in a flat bed without using bedrails?: A Lot Help needed moving from lying on your back to sitting on the side of a flat bed without using bedrails?: Total Help needed moving to and from a bed to a chair (including a wheelchair)?: A Little Help needed standing up from a chair using your arms (e.g., wheelchair or bedside chair)?: A Little Help needed to walk in hospital room?: A Little Help needed climbing 3-5 steps with a railing? : A Lot 6 Click Score: 14    End of Session Equipment Utilized During Treatment: Gait belt Activity Tolerance: Patient limited by fatigue;Patient limited by lethargy Patient left: in chair;with chair alarm set;with nursing/sitter in room;with call bell/phone within reach;with SCD's reapplied Nurse Communication: Mobility status PT Visit Diagnosis: Unsteadiness on feet (R26.81);Other abnormalities of gait and mobility (R26.89);Difficulty in walking, not elsewhere classified (R26.2);History of falling (Z91.81)     Time: [id number] PT Time Calculation (min) (ACUTE ONLY): 30 min  Charges:  $Gait Training: 23-37 mins                     9:59 AM, 08/01/19 Etta Grandchild, PT, DPT Physical Therapist - [facility]   [phone] ([place])    Annette Bertelson C 08/01/2019, 9:53 AM

## 2019-08-01 NOTE — Progress Notes (Signed)
[facility] [place] NAME: Jacqueline [name]    MR#:  [id number]  DATE OF BIRTH:  22-Oct-1935  SUBJECTIVE:  CHIEF COMPLAINT:  No chief complaint on file. No new complaints.  Much awake and alert.  Participating with therapist REVIEW OF SYSTEMS:  Review of Systems  Constitutional: Negative for diaphoresis, fever, malaise/fatigue and weight loss.  HENT: Negative for ear discharge, ear pain, hearing loss, nosebleeds, sore throat and tinnitus.   Eyes: Negative for blurred vision and pain.  Respiratory: Negative for cough, hemoptysis, shortness of breath and wheezing.   Cardiovascular: Negative for chest pain, palpitations, orthopnea and leg swelling.  Gastrointestinal: Negative for abdominal pain, blood in stool, constipation, diarrhea, heartburn, nausea and vomiting.  Genitourinary: Negative for dysuria, frequency and urgency.  Musculoskeletal: Positive for falls and joint pain. Negative for back pain and myalgias.  Skin: Negative for itching and rash.  Neurological: Negative for dizziness, tingling, tremors, focal weakness, seizures, weakness and headaches.  Psychiatric/Behavioral: Negative for depression. The patient is not nervous/anxious.    DRUG ALLERGIES:  No Known Allergies VITALS:  Blood pressure (!) 156/72, pulse 82, temperature 98.4 F (36.9 C), temperature source Oral, resp. rate 17, height 5\' 3"  (1.6 m), weight 47.2 kg, SpO2 95 %. PHYSICAL EXAMINATION:  Physical Exam HENT:     Head: Normocephalic and atraumatic.     Comments: Waning bruise on right side of face involving infraorbital and nasolabia area Eyes:     Conjunctiva/sclera: Conjunctivae normal.     Pupils: Pupils are equal, round, and reactive to light.  Neck:     Thyroid: No thyromegaly.     Trachea: No tracheal deviation.  Cardiovascular:     Rate and Rhythm: Normal rate and regular rhythm.     Heart sounds: Normal heart sounds.  Pulmonary:     Effort: Pulmonary effort is normal. No  respiratory distress.     Breath sounds: Normal breath sounds. No wheezing.  Chest:     Chest wall: No tenderness.  Abdominal:     General: Bowel sounds are normal. There is no distension.     Palpations: Abdomen is soft.     Tenderness: There is no abdominal tenderness.  Musculoskeletal:        General: Normal range of motion.     Cervical back: Normal range of motion and neck supple.     Left lower leg: Tenderness present.       Legs:  Skin:    General: Skin is warm and dry.     Findings: No rash.  Neurological:     Mental Status: She is alert and oriented to person, place, and time.     Cranial Nerves: No cranial nerve deficit.    LABORATORY PANEL:  Female CBC Recent Labs  Lab 08/01/19 0449  WBC 5.6  HGB 12.3  HCT 36.2  PLT 180   ------------------------------------------------------------------------------------------------------------------ Chemistries  Recent Labs  Lab 07/31/19 0346  NA 138  K 3.9  CL 102  CO2 29  GLUCOSE 176*  BUN 15  CREATININE 0.78  CALCIUM 8.7*   RADIOLOGY:  MR BRAIN WO CONTRAST  Result Date: 07/31/2019 CLINICAL DATA:  Encephalopathy.  Rule out stroke. EXAM: MRI HEAD WITHOUT CONTRAST TECHNIQUE: Multiplanar, multiecho pulse sequences of the brain and surrounding structures were obtained without intravenous contrast. COMPARISON:  CT head 07/29/2019 FINDINGS: Brain: Motion degraded study.  Allowing for this, no acute infarct Generalized atrophy. Chronic microvascular ischemic changes in the white matter. Negative for hemorrhage mass  or fluid collection. Vascular: Normal arterial flow voids Skull and upper cervical spine: Negative Sinuses/Orbits: Mucosal thickening left maxillary sinus. Bilateral cataract extraction Other: None IMPRESSION: Motion degraded study.  No acute abnormality. Electronically Signed   By: Jacqueline [name] M.D.   On: 07/31/2019 13:30   ASSESSMENT AND PLAN:  Jacqueline [name] is a 84 y.o. female with medical history  significant for Parkinson's disease, with associated dyskinesia, ambulant with a cane at baseline with history of falls, history of Parkinson's dementia, type 2 diabetes, CKD 3, hyperreactive airway on Symbicort and hypothyroidism who presented to the emergency room with persistent left hip pain following a fall on 07/22/2019, associated with decreased ability to ambulate  Closed left hip fracture ([address] [place])   Fall at home, accidental, initial encounter   Difficulty walking with frequent falls   Parkinson's disease ([place]) -Acute subcapital left hip fracture s/p pinning on 2/21 by orthopedics --CT, MRI head and CT C-spine -> no acute patho -Pain management  Nausea -Likely from narcotics.  As needed Zofran Now resolved  Hyperglycemia, type 2 diabetes mellitus with complications, stage 3 chronic kidney disease (HCC) -Sugar was 479 on admission, hemoglobin A1c 7.4 --Hold home diabetic medication regimen -Sliding scale insulin coverage  Facial bruise -appears stable and improving --CT maxillofacial - no acute patho -Compresses, warm cold as needed for comfort.  Acquired hypothyroidism -Continue home levothyroxine    Dementia due to Parkinson's disease without behavioral disturbance (HCC)  -Continue Aricept and Namenda    Reactive airway disease that is not asthma -Continue Flonase and Symbicort  Overall poor prognosis.  Palliative care planning to see her today.    DVT prophylaxis: Lovenox Family Communication: I have updated patient's daughter Jacqueline [name] over phone at [phone] Disposition Plan:  Came from Home D/C likely to SNF with palliative care to follow Barriers to DC -waiting for insurance authorization, SNF bed.    All the records are reviewed and case discussed with Care Management/Social Worker. Management plans discussed with the patient, nursing and they are in agreement.  CODE STATUS: Full Code  TOTAL TIME TAKING CARE OF THIS PATIENT: 35 minutes.   More than  50% of the time was spent in counseling/coordination of care: YES  POSSIBLE D/C IN 1 DAYS, DEPENDING ON CLINICAL CONDITION.   Jacqueline [name] M.D on 08/01/2019 at 12:21 PM  [facility]   CC: Primary care physician; Jacqueline Guise, MD  Note: This dictation was prepared with Dragon dictation along with smaller phrase technology. Any transcriptional errors that result from this process are unintentional.

## 2019-08-01 NOTE — Progress Notes (Signed)
Patient states sweating may be from " the bed' . Possibly the sheets. She is afebrile. Able to feed self with tray set up and verbal cues. Ambulated x 2 on unit today with P T

## 2019-08-01 NOTE — TOC Progression Note (Signed)
Transition of Care [facility]) - Progression Note    Patient Details  Name: Jacqueline [name] MRN: [id number] Date of Birth: 08/20/1935  Transition of Care [facility]) CM/SW [place], RN Phone Number: 08/01/2019, 9:56 AM  Clinical Narrative:   Manuela Neptune and bed search completed, Awaiting a bed offer then will review with the patient    Expected Discharge Plan: [place] Barriers to Discharge: Insurance Authorization, Continued Medical Work up, SNF Pending bed offer  Expected Discharge Plan and Services Expected Discharge Plan: [place]   Discharge Planning Services: CM Consult   Living arrangements for the past 2 months: Single Family Home                                       Social Determinants of Health (SDOH) Interventions    Readmission Risk Interventions No flowsheet data found.

## 2019-08-01 NOTE — Consult Note (Addendum)
Consultation Note Date: 08/01/2019   Patient Name: Jacqueline [name]  DOB: March 08, 1936  MRN: [id number]  Age / Sex: 84 y.o., female  PCP: Lavera Guise, MD Referring Physician: Max Sane, MD  Reason for Consultation: Establishing goals of care  HPI/Patient Profile: 84 y.o. female  with past medical history of Parkinson's disease and associated dyskinesia, dementia, T2DM, CKD3, reactive airway disease, and hypothyroidism admitted on 07/29/2019 with with left hip pain and decreased ability to ambulate following a fall on 2/13.  Patient found to have left hip fracture and had surgical repair 2/21. PMT consulted for [place].  Clinical Assessment and Goals of Care: I have reviewed medical records including EPIC notes, labs and imaging, received report from Dr. Manuella Ghazi and RN, assessed the patient and then met with patient and daughter, [name], to discuss diagnosis prognosis, [place], EOL wishes, disposition and options.  I introduced Palliative Medicine as specialized medical care for people living with serious illness. It focuses on providing relief from the symptoms and stress of a serious illness. The goal is to improve quality of life for both the patient and the family.  We discussed a brief life review of the patient. [name] tells me patient was living at home alone with paid caregivers during the day.   As far as functional and nutritional status, [name] tells me patient could get around with a walker prior to fall. She needed assistance with daily cares. Some issues with swallowing - was seeing speech therapy outpatient 2x weekly.  She tells me she had a good appetite. She does speak of forgetfulness.   We discussed her current illness and what it means in the larger context of her on-going co-morbidities. [name] tells me conditions and interventions have been well explained to her by medical team - no additional questions.   Discussed disposition plan - SNF rehab  hopeful for return home with paid caregivers.   I attempted to elicit values and goals of care important to the patient.  [name] shares that she is interested in [name] medical interventions offered to prolong life. She shares this was previously discussed prior to surgery and she has been clear about maintaining FULL code interventions.   Discussed that patient does have living will and HCPOA document. Living will states "I desire that my life not be prolonged by life-sustaining procedures if I am terminally ill, permanently in a coma, suffer severe dementia, or am in a persistent vegetative state". This was signed in 2014.   Discussed visitation policy - [name] is hopeful to be able to visit a little earlier to assist her mom with breakfast - will check with administration.   Questions and concerns were addressed. The family was encouraged to call with questions or concerns.   Primary Decision Maker NEXT OF KIN/HCPOA - Daughter [name] followed by daughter Garey Ham   SUMMARY OF RECOMMENDATIONS   - full code/full scope interventions - rehab at discharge - PMT will shadow chart   Code Status/Advance Care Planning:  Full code  Symptom Management:   Patient reports pain currently controlled with tylenol, denies any further nausea   Prognosis:   Unable to determine  Discharge Planning: SNF     Primary Diagnoses: Present on Admission: . Parkinson's disease ([place]) . Difficulty walking . Essential hypertension   I have reviewed the medical record, interviewed the patient and family, and examined the patient. The following aspects are pertinent.  Past Medical History:  Diagnosis Date  . Asthma   . Collapsed lung   .  Dementia ([place])   . Diabetes mellitus without complication ([place])    Patient takes Insulin twice a day.   Marland Kitchen GERD (gastroesophageal reflux disease)   . Hyperlipidemia   . Hypertension   . Hypothyroidism   . Parkinson's disease ([place])   . Reactive airway disease     Social History   Socioeconomic History  . Marital status: Widowed    Spouse name: Not on file  . Number of children: Not on file  . Years of education: Not on file  . Highest education level: Not on file  Occupational History  . Not on file  Tobacco Use  . Smoking status: Never Smoker  . Smokeless tobacco: Never Used  Substance and Sexual Activity  . Alcohol use: Not Currently  . Drug use: No  . Sexual activity: Not on file  Other Topics Concern  . Not on file  Social History Narrative  . Not on file   Social Determinants of Health   Financial Resource Strain:   . Difficulty of Paying Living Expenses: Not on file  Food Insecurity:   . Worried About Charity fundraiser in the Last Year: Not on file  . Ran Out of Food in the Last Year: Not on file  Transportation Needs:   . Lack of Transportation (Medical): Not on file  . Lack of Transportation (Non-Medical): Not on file  Physical Activity:   . Days of Exercise per Week: Not on file  . Minutes of Exercise per Session: Not on file  Stress:   . Feeling of Stress : Not on file  Social Connections:   . Frequency of Communication with Friends and Family: Not on file  . Frequency of Social Gatherings with Friends and Family: Not on file  . Attends Religious Services: Not on file  . Active Member of Clubs or Organizations: Not on file  . Attends Archivist Meetings: Not on file  . Marital Status: Not on file   Family History  Problem Relation Age of Onset  . Bladder Cancer Neg Hx   . Kidney cancer Neg Hx    Scheduled Meds: . carbidopa-levodopa  1 tablet Oral Daily   And  . carbidopa-levodopa  0.5 tablet Oral TID  . Carbidopa-Levodopa ER  1 tablet Oral QHS  . Chlorhexidine Gluconate Cloth  6 each Topical Daily  . docusate sodium  100 mg Oral BID  . donepezil  10 mg Oral QHS  . enoxaparin (LOVENOX) injection  40 mg Subcutaneous Q24H  . feeding supplement (GLUCERNA SHAKE)  237 mL Oral TID BM  .  fluticasone  1 spray Each Nare Daily  . hydrALAZINE  25 mg Oral Q8H  . insulin aspart  0-5 Units Subcutaneous QHS  . insulin aspart  0-9 Units Subcutaneous TID WC  . levothyroxine  50 mcg Oral QAC breakfast  . memantine  10 mg Oral BID  . mometasone-formoterol  2 puff Inhalation BID  . multivitamin with minerals  1 tablet Oral Daily  . mupirocin ointment  1 application Nasal BID  . pantoprazole  40 mg Oral Daily  . rasagiline  1 mg Oral Daily   Continuous Infusions: . sodium chloride 75 mL/hr at 07/31/19 0348   PRN Meds:.acetaminophen, bisacodyl, diphenhydrAMINE, hydrALAZINE, HYDROcodone-acetaminophen, magnesium hydroxide, metoCLOPramide **OR** metoCLOPramide (REGLAN) injection, morphine injection, ondansetron **OR** ondansetron (ZOFRAN) IV, senna-docusate, sodium phosphate, traMADol No Known Allergies Review of Systems  Constitutional: Positive for activity change and fatigue. Negative for appetite change.  Respiratory: Negative for shortness  of breath.   Gastrointestinal: Negative for abdominal pain, nausea and vomiting.  Musculoskeletal: Positive for gait problem.  Neurological: Positive for weakness.  Psychiatric/Behavioral: Positive for sleep disturbance.    Physical Exam Constitutional:      General: She is not in acute distress.    Comments: sleepy  HENT:     Head: Normocephalic and atraumatic.  Cardiovascular:     Rate and Rhythm: Normal rate and regular rhythm.  Pulmonary:     Effort: Pulmonary effort is normal.  Abdominal:     Palpations: Abdomen is soft.  Musculoskeletal:     Right lower leg: No edema.     Left lower leg: No edema.  Skin:    General: Skin is warm and dry.  Neurological:     Mental Status: She is oriented to person, place, and time.  Psychiatric:        Mood and Affect: Mood normal.        Behavior: Behavior normal.     Vital Signs: BP (!) 156/72 (BP Location: Right Arm)   Pulse 82   Temp 98.4 F (36.9 C) (Oral)   Resp 17   Ht '5\' 3"'$   (1.6 m)   Wt 47.2 kg   SpO2 95%   BMI 18.42 kg/m  Pain Scale: 0-10 POSS *See Group Information*: S-Acceptable,Sleep, easy to arouse Pain Score: 3    SpO2: SpO2: 95 % O2 Device:SpO2: 95 % O2 Flow Rate: .O2 Flow Rate (L/min): 8 L/min  IO: Intake/output summary:   Intake/Output Summary (Last 24 hours) at 08/01/2019 0957 Last data filed at 08/01/2019 9672 Gross per 24 hour  Intake 360 ml  Output --  Net 360 ml    LBM: Last BM Date: 07/29/19 Baseline Weight: Weight: 47.2 kg Most recent weight: Weight: 47.2 kg     Palliative Assessment/Data: PPS 40%    Time Total: 50 minutes Greater than 50%  of this time was spent counseling and coordinating care related to the above assessment and plan.  Juel Burrow, DNP, AGNP-C Palliative Medicine Team [phone] Pager: [phone]

## 2019-08-01 NOTE — Discharge Instructions (Signed)

## 2019-08-01 NOTE — TOC Progression Note (Signed)
Transition of Care [facility]) - Progression Note    Patient Details  Name: Jacqueline [name] MRN: [id number] Date of Birth: 1935-08-25  Transition of Care [facility]) CM/SW [place], RN Phone Number: 08/01/2019, 3:30 PM  Clinical Narrative:    Reviewed bed offers with the patient and her daughter the [facility] as she lives in [place] I notified [facility] of the choice and she will start the Holli Humbles and let me know once they obtain   Expected Discharge Plan: [place] Barriers to Discharge: Ship broker, Continued Medical Work up, SNF Pending bed offer  Expected Discharge Plan and Services Expected Discharge Plan: [place]   Discharge Planning Services: CM Consult   Living arrangements for the past 2 months: Single Family Home                                       Social Determinants of Health (SDOH) Interventions    Readmission Risk Interventions No flowsheet data found.

## 2019-08-01 NOTE — NC FL2 (Signed)
[place] LEVEL OF CARE SCREENING TOOL     IDENTIFICATION  Patient Name: Jacqueline [name] Birthdate: 1935/07/16 Sex: female Admission Date (Current Location): 07/29/2019  [place] and [place] Number:  Engineering geologist and Address:  [facility], [address] [place], [place] [address]      Provider Number: [id number]  Attending Physician Name and Address:  Max Sane, MD  Relative Name and Phone Number:  Garey Ham Daughter [phone]    Current Level of Care: Hospital Recommended Level of Care: [place] Prior Approval Number:    Date Approved/Denied:   PASRR Number: [id number]  Discharge Plan: SNF    Current Diagnoses: Patient Active Problem List   Diagnosis Date Noted  . Pressure injury of skin 07/30/2019  . Closed left hip fracture ([place]) 07/29/2019  . Fall at home, initial encounter 07/29/2019  . Acquired hypothyroidism 07/29/2019  . Dementia due to Parkinson's disease without behavioral disturbance ([place]) 07/29/2019  . Reactive airway disease that is not asthma 07/29/2019  . Frequent falls 07/29/2019  . Pressure injury of skin of sacral region 06/12/2019  . Wound healing, delayed 06/12/2019  . Encounter for general adult medical examination with abnormal findings 06/21/2018  . Uncontrolled type 2 diabetes mellitus with hyperglycemia ([place]) 06/21/2018  . Asthma without status asthmaticus 07/08/2016  . Hyperlipidemia, unspecified 07/08/2016  . Dysphagia 01/07/2016  . Unsteadiness 10/01/2015  . Type 2 diabetes mellitus without complication, without long-term current use of insulin ([place]) 08/07/2015  . Mixed Alzheimer's and vascular dementia ([place]) 08/06/2015  . Controlled type 2 diabetes mellitus without complication, without long-term current use of insulin ([place]) 02/12/2015  . Type 2 diabetes mellitus with stage 3 chronic kidney disease ([place]) 06/19/2014  . Essential hypertension 03/08/2014  . Parkinson's  disease ([place]) 01/16/2014  . Fatigue 11/09/2013  . Diabetes mellitus, type II ([place]) 09/15/2013  . Difficulty walking 09/15/2013  . Dizziness 09/15/2013    Orientation RESPIRATION BLADDER Height & Weight     Self, Time, Situation, Place  Normal Continent Weight: 47.2 kg Height:  5\' 3"  (160 cm)  BEHAVIORAL SYMPTOMS/MOOD NEUROLOGICAL BOWEL NUTRITION STATUS      Continent    AMBULATORY STATUS COMMUNICATION OF NEEDS Skin   Extensive Assist Verbally Surgical wounds                       Personal Care Assistance Level of Assistance  Dressing     Dressing Assistance: Limited assistance     Functional Limitations Info             SPECIAL CARE FACTORS FREQUENCY  PT (By licensed PT)     PT Frequency: 5 times per week              Contractures Contractures Info: Not present    Additional Factors Info  Code Status, Allergies Code Status Info: Full Code Allergies Info: NKDA           Current Medications (08/01/2019):  This is the current hospital active medication list Current Facility-Administered Medications  Medication Dose Route Frequency Provider Last Rate Last Admin  . 0.9 %  sodium chloride infusion   Intravenous Continuous Poggi, [place] Cork, MD 75 mL/hr at 07/31/19 0348 New Bag at 07/31/19 0348  . acetaminophen (TYLENOL) tablet 325-650 mg  325-650 mg Oral Q6H PRN Corky Mull, MD   650 mg at 08/01/19 [id number]  . bisacodyl (DULCOLAX) suppository 10 mg  10 mg Rectal Daily PRN Milagros Evener  J, MD      . carbidopa-levodopa (SINEMET IR) 25-100 MG per tablet immediate release 1 tablet  1 tablet Oral Daily Poggi, [place] Cork, MD   1 tablet at 08/01/19 [id number]   And  . carbidopa-levodopa (SINEMET IR) 25-100 MG per tablet immediate release 0.5 tablet  0.5 tablet Oral TID Corky Mull, MD   0.5 tablet at 07/31/19 2013  . Carbidopa-Levodopa ER (SINEMET CR) 25-100 MG tablet controlled release 1 tablet  1 tablet Oral QHS Poggi, [place] Cork, MD   1 tablet at 07/31/19 2246  . Chlorhexidine  Gluconate Cloth 2 % PADS 6 each  6 each Topical Daily Poggi, [place] Cork, MD   6 each at 07/31/19 [phone]  . diphenhydrAMINE (BENADRYL) 12.5 MG/5ML elixir 12.5-25 mg  12.5-25 mg Oral Q4H PRN Poggi, [place] Cork, MD      . docusate sodium (COLACE) capsule 100 mg  100 mg Oral BID Poggi, [place] Cork, MD   100 mg at 08/01/19 0933  . donepezil (ARICEPT) tablet 10 mg  10 mg Oral QHS Poggi, [place] Cork, MD   10 mg at 07/31/19 2245  . enoxaparin (LOVENOX) injection 40 mg  40 mg Subcutaneous Q24H Hallaji, Sheema M, RPH   40 mg at 08/01/19 [id number]  . feeding supplement (GLUCERNA SHAKE) (GLUCERNA SHAKE) liquid 237 mL  237 mL Oral TID BM Max Sane, MD   237 mL at 08/01/19 0934  . fluticasone (FLONASE) 50 MCG/ACT nasal spray 1 spray  1 spray Each Nare Daily Poggi, [place] Cork, MD   1 spray at 07/31/19 1020  . hydrALAZINE (APRESOLINE) injection 10 mg  10 mg Intravenous Q6H PRN Max Sane, MD   10 mg at 07/31/19 0923  . hydrALAZINE (APRESOLINE) tablet 25 mg  25 mg Oral Q8H Max Sane, MD   25 mg at 08/01/19 [id number]  . HYDROcodone-acetaminophen (NORCO/VICODIN) 5-325 MG per tablet 1 tablet  1 tablet Oral Q6H PRN Max Sane, MD      . insulin aspart (novoLOG) injection 0-5 Units  0-5 Units Subcutaneous QHS Poggi, [place] Cork, MD   2 Units at 07/29/19 2241  . insulin aspart (novoLOG) injection 0-9 Units  0-9 Units Subcutaneous TID WC Poggi, [place] Cork, MD   2 Units at 08/01/19 [phone]  . levothyroxine (SYNTHROID) tablet 50 mcg  50 mcg Oral QAC breakfast Poggi, [place] Cork, MD   50 mcg at 08/01/19 [phone]  . magnesium hydroxide (MILK OF MAGNESIA) suspension 30 mL  30 mL Oral Daily PRN Poggi, [place] Cork, MD      . memantine [facility]) tablet 10 mg  10 mg Oral BID Corky Mull, MD   10 mg at 08/01/19 0933  . metoCLOPramide (REGLAN) tablet 5-10 mg  5-10 mg Oral Q8H PRN Poggi, [place] Cork, MD       Or  . metoCLOPramide (REGLAN) injection 5-10 mg  5-10 mg Intravenous Q8H PRN Poggi, [place] Cork, MD   10 mg at 07/31/19 1050  . mometasone-formoterol (DULERA) 100-5 MCG/ACT inhaler 2 puff   2 puff Inhalation BID Poggi, [place] Cork, MD   2 puff at 08/01/19 [phone]  . morphine 2 MG/ML injection 0.5 mg  0.5 mg Intravenous Q2H PRN Poggi, [place] Cork, MD      . multivitamin with minerals tablet 1 tablet  1 tablet Oral Daily Poggi, [place] Cork, MD   1 tablet at 08/01/19 0933  . mupirocin ointment (BACTROBAN) 2 % 1 application  1 application Nasal BID Poggi, [place] Cork, MD   1 application  at 08/01/19 0935  . ondansetron (ZOFRAN) tablet 4 mg  4 mg Oral Q6H PRN Poggi, [place] Cork, MD       Or  . ondansetron [facility] injection 4 mg  4 mg Intravenous Q6H PRN Poggi, [place] Cork, MD   4 mg at 07/31/19 1012  . pantoprazole (PROTONIX) EC tablet 40 mg  40 mg Oral Daily Poggi, [place] Cork, MD   40 mg at 08/01/19 0933  . rasagiline (AZILECT) tablet 1 mg  1 mg Oral Daily Poggi, [place] Cork, MD   1 mg at 08/01/19 0934  . senna-docusate (Senokot-S) tablet 1 tablet  1 tablet Oral QHS PRN Poggi, [place] Cork, MD      . sodium phosphate (FLEET) 7-19 GM/118ML enema 1 enema  1 enema Rectal Once PRN Poggi, [place] Cork, MD      . traMADol Veatrice Bourbon) tablet 50 mg  50 mg Oral Q6H PRN Poggi, [place] Cork, MD   50 mg at 07/31/19 [id number]     Discharge Medications: Please see discharge summary for a list of discharge medications.  Relevant Imaging Results:  Relevant Lab Results:   Additional Information SS# [id number]  Su Hilt, RN

## 2019-08-01 NOTE — Care Management Important Message (Signed)
Important Message  Patient Details  Name: Jacqueline [name] MRN: [id number] Date of Birth: Jul 30, 1935   Medicare Important Message Given:  Yes     [name] 08/01/2019, 11:30 AM

## 2019-08-02 ENCOUNTER — Ambulatory Visit: Payer: Medicare HMO | Admitting: Speech Pathology

## 2019-08-02 LAB — GLUCOSE, CAPILLARY
Glucose-Capillary: 164 mg/dL — ABNORMAL HIGH (ref 70–99)
Glucose-Capillary: 201 mg/dL — ABNORMAL HIGH (ref 70–99)
Glucose-Capillary: 248 mg/dL — ABNORMAL HIGH (ref 70–99)
Glucose-Capillary: 227 mg/dL — ABNORMAL HIGH (ref 70–99)

## 2019-08-02 LAB — BASIC METABOLIC PANEL
Anion gap: 11 (ref 5–15)
BUN: 22 mg/dL (ref 8–23)
CO2: 29 mmol/L (ref 22–32)
Calcium: 9.4 mg/dL (ref 8.9–10.3)
Chloride: 101 mmol/L (ref 98–111)
Creatinine, Ser: 0.89 mg/dL (ref 0.44–1.00)
GFR calc Af Amer: >60 mL/min (ref >60)
GFR calc non Af Amer: >60 mL/min (ref >60)
Glucose, Bld: 190 mg/dL — ABNORMAL HIGH (ref 70–99)
Potassium: 4.3 mmol/L (ref 3.5–5.1)
Sodium: 141 mmol/L (ref 135–145)

## 2019-08-02 LAB — CBC
HCT: 35.4 % — ABNORMAL LOW (ref 36.0–46.0)
Hemoglobin: 12.2 g/dL (ref 12.0–15.0)
MCH: 30.9 pg (ref 26.0–34.0)
MCHC: 34.5 g/dL (ref 30.0–36.0)
MCV: 89.6 fL (ref 80.0–100.0)
Platelets: 185 10*3/uL (ref 150–400)
RBC: 3.95 MIL/uL (ref 3.87–5.11)
RDW: 12.6 % (ref 11.5–15.5)
WBC: 5.4 10*3/uL (ref 4.0–10.5)
nRBC: 0 % (ref 0.0–0.2)

## 2019-08-02 MED ORDER — HYDRALAZINE HCL 50 MG PO TABS
50.0000 mg | ORAL_TABLET | Freq: Three times a day (TID) | ORAL | Status: DC
  Administered 2019-08-02 – 2019-08-03 (×3): 50 mg via ORAL
  Filled 2019-08-02 (×4): qty 1

## 2019-08-02 MED ORDER — HYDROCERIN EX CREA
TOPICAL_CREAM | Freq: Two times a day (BID) | CUTANEOUS | Status: DC
  Filled 2019-08-02: qty 113

## 2019-08-02 NOTE — Progress Notes (Signed)
Physical Therapy Treatment Patient Details Name: Jacqueline [name] MRN: [id number] DOB: June 18, 1935 Today's Date: 08/02/2019    History of Present Illness Jacqueline [name] is an 80yoF who comes to [facility] 2/20 after 1 week decomposition of gait d/t progressive pain. Pt sustained a fall on 2/13 associated with LOC. XR reveals a left subcapital hip fracture. At baseline pt AMB with RW. PMH: Parkinsonism, dementia, DM, prior syncope, falls. Pt underwent cannulated hip pinning on 2/21. At baseline pt lives alone at home with caregivers inhome 7d/week until afterrnoon. Pt at baselien needs assist with ADL, and AMB household distances with [id number]. Pt reports falling ~1x/3-4 weeks.    PT Comments    Pt in chair upon arrival, DTR [name] at bedside. Pt agreeable to participate, pleasant awake, appears to have no pain, reports some soreness that was resolved with Tylenol earlier. Minguard assist for transfers, noted improved strength and balance acquisition this date.  First AMB interval of 12ft without any overt LOB requiring assistance, most tardive dyskinetic movements less noticeable and heavily oriented to head/neck movements while AMB. Pt/DTR agreeable to attempt stairs training which goes well, again minGuard assist only, pt able to perform reciprocal gait up stairs. Pt does poorly at following cues for stepping sequencing. DTR in attendance pleased with progression in function. Pt able to AMB back to room from gym at end of session. Discussed updated DC recommendations and equipment needs with DTR, she is agreeable and can provide additional 24/7 supervision for a short interval. RNCM made aware.     Follow Up Recommendations  Supervision for mobility/OOB;Home health PT     Equipment Recommendations  3in1 (PT)    Recommendations for Other Services       Precautions / Restrictions Precautions Precautions: Fall Restrictions LLE Weight Bearing: Weight bearing as tolerated    Mobility  Bed Mobility                General bed mobility comments: in chair upon arrival  Transfers Overall transfer level: Needs assistance Equipment used: Rolling walker (2 wheeled);1 person hand held assist Transfers: Sit to/from Stand Sit to Stand: Min guard         General transfer comment: 4x in session, minA for 1 of these for balance correction  Ambulation/Gait Ambulation/Gait assistance: Min guard Gait Distance (Feet): 160 Feet(then 123ft after stairs training) Assistive device: Rolling walker (2 wheeled)       General Gait Details: better control of balance and movement this date, no overt lob with gait, has some unsafe RW use durign turns, recommend hands on minGuard assist for all AMB at DC to [place]: Yes   Stair Management: Two rails;Alternating pattern;Forwards;Backwards Number of Stairs: 6 General stair comments: difficulty following cues for sequencing but it able to reciprocally ascend then descend backwards due to impulsivity. DTR [name] in attendance, no LOB.   Wheelchair Mobility    Modified Rankin (Stroke Patients Only)       Balance Overall balance assessment: Modified Independent;History of Falls Sitting-balance support: Single extremity supported;Feet supported Sitting balance-Leahy Scale: Good     Standing balance support: Bilateral upper extremity supported;During functional activity Standing balance-Leahy Scale: Fair Standing balance comment: improving                            Cognition Arousal/Alertness: Awake/alert Behavior During Therapy: WFL for tasks assessed/performed Overall Cognitive Status: History of cognitive impairments - at baseline  Exercises      General Comments        Pertinent Vitals/Pain Pain Assessment: Faces Faces Pain Scale: No hurt    Home Living                      Prior Function            PT Goals (current goals  can now be found in the care plan section) Acute Rehab PT Goals Patient Stated Goal: achieve baseline independence with mobility. PT Goal Formulation: With patient Time For Goal Achievement: 08/14/19 Potential to Achieve Goals: Good Progress towards PT goals: Progressing toward goals    Frequency    BID      PT Plan Discharge plan needs to be updated;Equipment recommendations need to be updated    Co-evaluation              AM-PAC PT "6 Clicks" Mobility   Outcome Measure  Help needed turning from your back to your side while in a flat bed without using bedrails?: A Lot Help needed moving from lying on your back to sitting on the side of a flat bed without using bedrails?: A Lot Help needed moving to and from a bed to a chair (including a wheelchair)?: A Little Help needed standing up from a chair using your arms (e.g., wheelchair or bedside chair)?: A Little Help needed to walk in hospital room?: A Little Help needed climbing 3-5 steps with a railing? : A Little 6 Click Score: 16    End of Session Equipment Utilized During Treatment: Gait belt Activity Tolerance: Patient limited by fatigue;Patient limited by lethargy Patient left: with call bell/phone within reach;with family/visitor present;in chair;with chair alarm set Nurse Communication: Mobility status PT Visit Diagnosis: Unsteadiness on feet (R26.81);Other abnormalities of gait and mobility (R26.89);Difficulty in walking, not elsewhere classified (R26.2);History of falling (Z91.81)     Time: [id number] PT Time Calculation (min) (ACUTE ONLY): 23 min  Charges:  $Gait Training: 8-22 mins $Therapeutic Exercise: 8-22 mins                     12:26 PM, 08/02/19 Etta Grandchild, PT, DPT Physical Therapist - [facility]  [phone] ([place])     Haynes Giannotti C 08/02/2019, 12:22 PM

## 2019-08-02 NOTE — Progress Notes (Signed)
Subjective: 3 Days Post-Op Procedure(s) (LRB): CANNULATED HIP PINNING (Left) Patient reports pain as mild.   Patient is well this morning, much more responsive. Daughter at bedside this morning. Plan for discharge to SNF when appropriate. Negative for chest pain and shortness of breath Fever: no Gastrointestinal:Negative for nausea and vomiting  Objective: Vital signs in last 24 hours: Temp:  [98.2 F (36.8 C)-98.7 F (37.1 C)] 98.7 F (37.1 C) (02/24 0009) Pulse Rate:  [78-87] 83 (02/24 0522) Resp:  [14-18] 14 (02/24 0009) BP: (148-165)/(72-81) 154/81 (02/24 0522) SpO2:  [94 %-98 %] 94 % (02/24 0009)  Intake/Output from previous day:  Intake/Output Summary (Last 24 hours) at 08/02/2019 0802 Last data filed at 08/01/2019 1700 Gross per 24 hour  Intake 480 ml  Output 1 ml  Net 479 ml    Intake/Output this shift: No intake/output data recorded.  Labs: Recent Labs    07/31/19 0346 08/01/19 0449 08/02/19 0428  HGB 12.4 12.3 12.2   Recent Labs    08/01/19 0449 08/02/19 0428  WBC 5.6 5.4  RBC 4.07 3.95  HCT 36.2 35.4*  PLT 180 185   Recent Labs    07/31/19 0346 08/02/19 0428  NA 138 141  K 3.9 4.3  CL 102 101  CO2 29 29  BUN 15 22  CREATININE 0.78 0.89  GLUCOSE 176* 190*  CALCIUM 8.7* 9.4   No results for input(s): LABPT, INR in the last 72 hours.   EXAM General - Patient is Alert and Appropriate Extremity - ABD soft Sensation intact distally Intact pulses distally Dorsiflexion/Plantar flexion intact Incision: dressing C/D/I No cellulitis present Dressing/Incision - clean, dry, no drainage Motor Function - intact, moving foot and toes well on exam.  Past Medical History:  Diagnosis Date  . Asthma   . Collapsed lung   . Dementia ([place])   . Diabetes mellitus without complication ([place])    Patient takes Insulin twice a day.   Marland Kitchen GERD (gastroesophageal reflux disease)   . Hyperlipidemia   . Hypertension   . Hypothyroidism   . Parkinson's  disease ([place])   . Reactive airway disease     Assessment/Plan: 3 Days Post-Op Procedure(s) (LRB): CANNULATED HIP PINNING (Left) Principal Problem:   Closed left hip fracture (HCC) Active Problems:   Type 2 diabetes mellitus with stage 3 chronic kidney disease (HCC)   Difficulty walking   Essential hypertension   Parkinson's disease ([place])   Fall at home, initial encounter   Acquired hypothyroidism   Dementia due to Parkinson's disease without behavioral disturbance ([place])   Reactive airway disease that is not asthma   Frequent falls   Pressure injury of skin   Goals of care, counseling/discussion   Palliative care by specialist  Estimated body mass index is 18.42 kg/m as calculated from the following:   Height as of this encounter: 5\' 3"  (1.6 m).   Weight as of this encounter: 47.2 kg. Advance diet Up with therapy   Labs reviewed this AM. WBC 5.4, Hg 12.3 this AM. Up with therapy today when tolerated. Plan for d/c to SNF when appropriate.  Upon discharge from hospital, follow-up with [facility] in two weeks for staple removal and x-rays. Lovenox 40mg  daily for 14 days following surgery for DVT prevention.  DVT Prophylaxis - Lovenox, Foot Pumps and TED hose Weight-Bearing as tolerated to left leg  J. Cameron Proud, PA-C [facility] 08/02/2019, 8:02 AM

## 2019-08-02 NOTE — Progress Notes (Signed)
PROGRESS NOTE    Jacqueline [name]  [id number] DOB: December 17, 1935 DOA: 07/29/2019 PCP: Lavera Guise, MD       Assessment & Plan:   Principal Problem:   Closed left hip fracture [facility]) Active Problems:   Type 2 diabetes mellitus with stage 3 chronic kidney disease ([place])   Difficulty walking   Essential hypertension   Parkinson's disease ([place])   Fall at home, initial encounter   Acquired hypothyroidism   Dementia due to Parkinson's disease without behavioral disturbance ([place])   Reactive airway disease that is not asthma   Frequent falls   Pressure injury of skin   Goals of care, counseling/discussion   Palliative care by specialist   Closed left hip fracture: secondary to fall at home. Difficulty walking with frequent falls. S/p pinning on 07/30/19 by ortho surg. Norco, tramadol prn for pain. Zofran prn for nausea/vomiting   Parkinson's disease: continue on rasagiline, sinemet   Generalized weakness: PT initially recommended SNF but now recs [facility]    DM2:w/ CKDIII. HbA1c 7.4. Continue to hold home diabetic medication regimen. Continue on SSI w/ accuchecks   Facial bruise: likely secondary to fall at home. Appears stableand improving. CT maxillofacial  shows no acute pathology. Compresses, warm cold as needed for comfort.  Hypothyroidism: continue on levothyroxine  Dementia: due to Parkinson's disease. Continue Aricept and Namenda  Reactive airway disease:  not asthma. Continue Flonase and Symbicort   DVT prophylaxis: lovenox  Code Status: full Family Communication: discussed w/ pt's daughter who was at bedside and answered her questions  Disposition Plan: will likely d/c w/ HH as pt clinically improves    Consultants:   Ortho surg    Procedures:    Antimicrobials: n/a   Subjective: Pt c/o back itching   Objective: Vitals:   08/01/19 2125 08/02/19 0009 08/02/19 0522 08/02/19 0803  BP: (!) 165/80 (!) 160/81 (!) 154/81 (!) 168/82  Pulse: 87 86 83  74  Resp: 18 14    Temp:  98.7 F (37.1 C)  98 F (36.7 C)  TempSrc:  Oral    SpO2: 98% 94%  100%  Weight:      Height:        Intake/Output Summary (Last 24 hours) at 08/02/2019 0822 Last data filed at 08/01/2019 1700 Gross per 24 hour  Intake 480 ml  Output 1 ml  Net 479 ml   Filed Weights   07/29/19 1431  Weight: 47.2 kg    Examination:  General exam: Appears calm and comfortable. Sitting up in a chair  Respiratory system: diminished breath sounds b/l. No rales, rhonchi Cardiovascular system: S1 & S2+. No rubs, gallops or clicks.  Gastrointestinal system: Abdomen is nondistended, soft and nontender. Normal bowel sounds heard. Central nervous system: Alert and awake. Moves all 4 extremities  Psychiatry: Judgement and insight appear abnormal. Flat mood and affect     Data Reviewed: I have personally reviewed following labs and imaging studies  CBC: Recent Labs  Lab 07/29/19 1436 07/31/19 0346 08/01/19 0449 08/02/19 0428  WBC 5.1 6.2 5.6 5.4  HGB 13.1 12.4 12.3 12.2  HCT 39.9 37.1 36.2 35.4*  MCV 91.5 90.3 88.9 89.6  PLT 187 177 180 [id number]   Basic Metabolic Panel: Recent Labs  Lab 07/29/19 1436 07/31/19 0346 08/02/19 0428  NA 132* 138 141  K 4.5 3.9 4.3  CL 95* 102 101  CO2 29 29 29   GLUCOSE 479* 176* 190*  BUN 26* 15 22  CREATININE 1.15* 0.78 0.89  CALCIUM 9.6 8.7* 9.4   GFR: Estimated Creatinine Clearance: 36.9 mL/min (by C-G formula based on SCr of 0.89 mg/dL). Liver Function Tests: No results for input(s): AST, ALT, ALKPHOS, BILITOT, PROT, ALBUMIN in the last 168 hours. No results for input(s): LIPASE, AMYLASE in the last 168 hours. No results for input(s): AMMONIA in the last 168 hours. Coagulation Profile: No results for input(s): INR, PROTIME in the last 168 hours. Cardiac Enzymes: No results for input(s): CKTOTAL, CKMB, CKMBINDEX, TROPONINI in the last 168 hours. BNP (last 3 results) No results for input(s): PROBNP in the last 8760  hours. HbA1C: No results for input(s): HGBA1C in the last 72 hours. CBG: Recent Labs  Lab 08/01/19 0815 08/01/19 1157 08/01/19 1603 08/01/19 2037 08/02/19 0805  GLUCAP 190* 236* 180* 182* 164*   Lipid Profile: No results for input(s): CHOL, HDL, LDLCALC, TRIG, CHOLHDL, LDLDIRECT in the last 72 hours. Thyroid Function Tests: No results for input(s): TSH, T4TOTAL, FREET4, T3FREE, THYROIDAB in the last 72 hours. Anemia Panel: No results for input(s): VITAMINB12, FOLATE, FERRITIN, TIBC, IRON, RETICCTPCT in the last 72 hours. Sepsis Labs: No results for input(s): PROCALCITON, LATICACIDVEN in the last 168 hours.  Recent Results (from the past 240 hour(s))  Respiratory Panel by RT PCR (Flu A&B, Covid) - Nasopharyngeal Swab     Status: None   Collection Time: 07/29/19  7:47 PM   Specimen: Nasopharyngeal Swab  Result Value Ref Range Status   SARS Coronavirus 2 by RT PCR NEGATIVE NEGATIVE Final    Comment: (NOTE) SARS-CoV-2 target nucleic acids are NOT DETECTED. The SARS-CoV-2 RNA is generally detectable in upper respiratoy specimens during the acute phase of infection. The lowest concentration of SARS-CoV-2 viral copies this assay can detect is 131 copies/mL. A negative result does not preclude SARS-Cov-2 infection and should not be used as the sole basis for treatment or other patient management decisions. A negative result may occur with  improper specimen collection/handling, submission of specimen other than nasopharyngeal swab, presence of viral mutation(s) within the areas targeted by this assay, and inadequate number of viral copies (<131 copies/mL). A negative result must be combined with clinical observations, patient history, and epidemiological information. The expected result is Negative. Fact Sheet for Patients:  PinkCheek.be Fact Sheet for Healthcare Providers:  GravelBags.it This test is not yet ap proved or  cleared by the [place] FDA and  has been authorized for detection and/or diagnosis of SARS-CoV-2 by FDA under an Emergency Use Authorization (EUA). This EUA will remain  in effect (meaning this test can be used) for the duration of the COVID-19 declaration under Section 564(b)(1) of the Act, 21 U.S.C. section 360bbb-3(b)(1), unless the authorization is terminated or revoked sooner.    Influenza A by PCR NEGATIVE NEGATIVE Final   Influenza B by PCR NEGATIVE NEGATIVE Final    Comment: (NOTE) The Xpert Xpress SARS-CoV-2/FLU/RSV assay is intended as an aid in  the diagnosis of influenza from Nasopharyngeal swab specimens and  should not be used as a sole basis for treatment. Nasal washings and  aspirates are unacceptable for Xpert Xpress SARS-CoV-2/FLU/RSV  testing. Fact Sheet for Patients: PinkCheek.be Fact Sheet for Healthcare Providers: GravelBags.it This test is not yet approved or cleared by the [place] FDA and  has been authorized for detection and/or diagnosis of SARS-CoV-2 by  FDA under an Emergency Use Authorization (EUA). This EUA will remain  in effect (meaning this test can be used) for the duration of the  Covid-19 declaration under Section  564(b)(1) of the Act, 21  U.S.C. section 360bbb-3(b)(1), unless the authorization is  terminated or revoked. Performed at [facility], [address] [place], [place] [address]   Surgical PCR screen     Status: None   Collection Time: 07/30/19  6:58 AM   Specimen: Nasal Mucosa; Nasal Swab  Result Value Ref Range Status   MRSA, PCR NEGATIVE NEGATIVE Final   Staphylococcus aureus NEGATIVE NEGATIVE Final    Comment: (NOTE) The Xpert SA Assay (FDA approved for NASAL specimens in patients 2 years of age and older), is one component of a comprehensive surveillance program. It is not intended to diagnose infection nor to guide or monitor  treatment. Performed at [facility], [address], [place], [place] [address]          Radiology Studies: MR BRAIN WO CONTRAST  Result Date: 07/31/2019 CLINICAL DATA:  Encephalopathy.  Rule out stroke. EXAM: MRI HEAD WITHOUT CONTRAST TECHNIQUE: Multiplanar, multiecho pulse sequences of the brain and surrounding structures were obtained without intravenous contrast. COMPARISON:  CT head 07/29/2019 FINDINGS: Brain: Motion degraded study.  Allowing for this, no acute infarct Generalized atrophy. Chronic microvascular ischemic changes in the white matter. Negative for hemorrhage mass or fluid collection. Vascular: Normal arterial flow voids Skull and upper cervical spine: Negative Sinuses/Orbits: Mucosal thickening left maxillary sinus. Bilateral cataract extraction Other: None IMPRESSION: Motion degraded study.  No acute abnormality. Electronically Signed   By: Franchot Gallo M.D.   On: 07/31/2019 13:30        Scheduled Meds: . carbidopa-levodopa  1 tablet Oral Daily   And  . carbidopa-levodopa  0.5 tablet Oral TID  . Carbidopa-Levodopa ER  1 tablet Oral QHS  . Chlorhexidine Gluconate Cloth  6 each Topical Daily  . docusate sodium  100 mg Oral BID  . donepezil  10 mg Oral QHS  . enoxaparin (LOVENOX) injection  40 mg Subcutaneous Q24H  . feeding supplement (GLUCERNA SHAKE)  237 mL Oral TID BM  . fluticasone  1 spray Each Nare Daily  . hydrALAZINE  25 mg Oral Q8H  . insulin aspart  0-5 Units Subcutaneous QHS  . insulin aspart  0-9 Units Subcutaneous TID WC  . levothyroxine  50 mcg Oral QAC breakfast  . memantine  10 mg Oral BID  . mometasone-formoterol  2 puff Inhalation BID  . multivitamin with minerals  1 tablet Oral Daily  . mupirocin ointment  1 application Nasal BID  . pantoprazole  40 mg Oral Daily  . rasagiline  1 mg Oral Daily   Continuous Infusions: . sodium chloride 75 mL/hr at 07/31/19 0348     LOS: 4 days    Time spent: 32 mins    Wyvonnia Dusky, MD [facility] Pager [phone]  If 7PM-7AM, please contact night-coverage [email or web address] 08/02/2019, 8:22 AM

## 2019-08-02 NOTE — TOC Progression Note (Signed)
Transition of Care [facility]) - Progression Note    Patient Details  Name: Jacqueline [name] MRN: [id number] Date of Birth: 11-25-1935  Transition of Care [facility]) CM/SW [place], RN Phone Number: 08/02/2019, 1:07 PM  Clinical Narrative:     Met with the patient and the daughter, the patient is doing well enough to go home with Home health, [facility] has [facility] the patient and will go out as soon as Friday, 3 in 1 brought to the room by [employer]  Expected Discharge Plan: [place] Barriers to Discharge: Ship broker, Continued Medical Work up, SNF Pending bed offer  Expected Discharge Plan and Services Expected Discharge Plan: [place]   Discharge Planning Services: CM Consult   Living arrangements for the past 2 months: Single Family Home                                       Social Determinants of Health (SDOH) Interventions    Readmission Risk Interventions No flowsheet data found.

## 2019-08-02 NOTE — Progress Notes (Signed)
Physical Therapy Treatment Patient Details Name: Jacqueline [name] MRN: [id number] DOB: 1936-01-04 Today's Date: 08/02/2019    History of Present Illness Jacqueline [name] is an 3yoF who [facility] 2/20 after 1 week decomposition of gait d/t progressive pain. Pt sustained a fall on 2/13 associated with LOC. XR reveals a left subcapital hip fracture. At baseline pt AMB with RW. PMH: Parkinsonism, dementia, DM, prior syncope, falls. Pt underwent cannulated hip pinning on 2/21. At baseline pt lives alone at home with caregivers inhome 7d/week until afterrnoon. Pt at baselien needs assist with ADL, and AMB household distances with [id number]. Pt reports falling ~1x/3-4 weeks.    PT Comments    Pt just back in bed upon entry, reluctant to participate but is persuaded by encouragement from DTR. DTR educated on best way to provide assistance for bed mobility, transfers, and guarding for AMB. Pt able to progress AMB to >211ft this session without LOB or fatigue limitations. Pt progressing well toward all goals in general.      Follow Up Recommendations  Supervision for mobility/OOB;Home health PT     Equipment Recommendations  3in1 (PT)    Recommendations for Other Services       Precautions / Restrictions Precautions Precautions: Fall Restrictions Weight Bearing Restrictions: Yes LLE Weight Bearing: Weight bearing as tolerated    Mobility  Bed Mobility Overal bed mobility: Needs Assistance Bed Mobility: Supine to Sit;Rolling;Sit to Supine Rolling: Mod assist   Supine to sit: Mod assist     General bed mobility comments: DTR [name] assisting with bed mobility, author provides point of education to improve caregiver ergonomics and pt effort  Transfers Overall transfer level: Needs assistance Equipment used: Rolling walker (2 wheeled) Transfers: Sit to/from Stand Sit to Stand: Min assist         General transfer comment: Education provided to DTR  Ambulation/Gait Ambulation/Gait  assistance: Min guard Gait Distance (Feet): 260 Feet Assistive device: Rolling walker (2 wheeled) Gait Pattern/deviations: WFL(Within Functional Limits) Gait velocity: 0.28m/s   General Gait Details: slower than AM, but no freezing, no LOB; cues for safety with 180 degree turns   Stairs Stairs: Yes   Stair Management: Two rails;Alternating pattern;Forwards;Backwards Number of Stairs: 6 General stair comments: difficulty following cues for sequencing but it able to reciprocally ascend then descend backwards due to impulsivity. DTR [name] in attendance, no LOB.   Wheelchair Mobility    Modified Rankin (Stroke Patients Only)       Balance Overall balance assessment: Modified Independent;History of Falls Sitting-balance support: Single extremity supported;Feet supported Sitting balance-Leahy Scale: Good     Standing balance support: Bilateral upper extremity supported;During functional activity Standing balance-Leahy Scale: Fair Standing balance comment: improving                            Cognition Arousal/Alertness: Awake/alert Behavior During Therapy: WFL for tasks assessed/performed Overall Cognitive Status: History of cognitive impairments - at baseline                                        Exercises      General Comments        Pertinent Vitals/Pain Pain Assessment: No/denies pain Faces Pain Scale: No hurt    Home Living  Prior Function            PT Goals (current goals can now be found in the care plan section) Acute Rehab PT Goals Patient Stated Goal: achieve baseline independence with mobility. PT Goal Formulation: With patient Time For Goal Achievement: 08/14/19 Potential to Achieve Goals: Good Progress towards PT goals: Progressing toward goals    Frequency    BID      PT Plan Discharge plan needs to be updated;Equipment recommendations need to be updated    Co-evaluation               AM-PAC PT "6 Clicks" Mobility   Outcome Measure  Help needed turning from your back to your side while in a flat bed without using bedrails?: A Lot Help needed moving from lying on your back to sitting on the side of a flat bed without using bedrails?: A Lot Help needed moving to and from a bed to a chair (including a wheelchair)?: A Little Help needed standing up from a chair using your arms (e.g., wheelchair or bedside chair)?: A Little Help needed to walk in hospital room?: A Little Help needed climbing 3-5 steps with a railing? : A Little 6 Click Score: 16    End of Session Equipment Utilized During Treatment: Gait belt Activity Tolerance: Patient tolerated treatment well;No increased pain Patient left: with call bell/phone within reach;with family/visitor present;in bed Nurse Communication: Mobility status PT Visit Diagnosis: Unsteadiness on feet (R26.81);Other abnormalities of gait and mobility (R26.89);Difficulty in walking, not elsewhere classified (R26.2);History of falling (Z91.81)     Time: [id number] PT Time Calculation (min) (ACUTE ONLY): 21 min  Charges:  $Gait Training: 8-22 mins $Therapeutic Exercise: 8-22 mins                     3:35 PM, 08/02/19 Etta Grandchild, PT, DPT Physical Therapist - [facility]  [phone] ([place])    Bo Teicher C 08/02/2019, 3:33 PM

## 2019-08-03 LAB — BASIC METABOLIC PANEL
Anion gap: 6 (ref 5–15)
BUN: 24 mg/dL — ABNORMAL HIGH (ref 8–23)
CO2: 35 mmol/L — ABNORMAL HIGH (ref 22–32)
Calcium: 9.4 mg/dL (ref 8.9–10.3)
Chloride: 100 mmol/L (ref 98–111)
Creatinine, Ser: 0.77 mg/dL (ref 0.44–1.00)
GFR calc Af Amer: >60 mL/min (ref >60)
GFR calc non Af Amer: >60 mL/min (ref >60)
Glucose, Bld: 176 mg/dL — ABNORMAL HIGH (ref 70–99)
Potassium: 3.7 mmol/L (ref 3.5–5.1)
Sodium: 141 mmol/L (ref 135–145)

## 2019-08-03 LAB — GLUCOSE, CAPILLARY
Glucose-Capillary: 190 mg/dL — ABNORMAL HIGH (ref 70–99)
Glucose-Capillary: 254 mg/dL — ABNORMAL HIGH (ref 70–99)

## 2019-08-03 LAB — URINE CULTURE: Culture: 30000 — AB

## 2019-08-03 LAB — CBC
HCT: 39.1 % (ref 36.0–46.0)
Hemoglobin: 12.8 g/dL (ref 12.0–15.0)
MCH: 29.6 pg (ref 26.0–34.0)
MCHC: 32.7 g/dL (ref 30.0–36.0)
MCV: 90.5 fL (ref 80.0–100.0)
Platelets: 215 10*3/uL (ref 150–400)
RBC: 4.32 MIL/uL (ref 3.87–5.11)
RDW: 12.7 % (ref 11.5–15.5)
WBC: 5.2 10*3/uL (ref 4.0–10.5)
nRBC: 0 % (ref 0.0–0.2)

## 2019-08-03 NOTE — Care Management Important Message (Signed)
Important Message  Patient Details  Name: Jacqueline [name] MRN: [id number] Date of Birth: 27-Sep-1935   Medicare Important Message Given:  Yes     Juliann Pulse A Jefferie Holston 08/03/2019, 10:47 AM

## 2019-08-03 NOTE — Discharge Summary (Signed)
Physician Discharge Summary  Jacqueline [name] [id number] DOB: 03-29-1936 DOA: 07/29/2019  PCP: Lavera Guise, MD  Admit date: 07/29/2019 Discharge date: 08/03/2019  Admitted From: home Disposition: home w/ home health   Recommendations for Outpatient Follow-up:  1. Follow up with PCP in 1-2 weeks 2. F/u ortho surg in 2 weeks  Home Health: yes Equipment/Devices:  Discharge Condition: stable CODE STATUS: full  Diet recommendation: Heart Healthy / Carb Modified  Brief/Interim Summary: HPI was taken from Dr. Damita Dunnings: Jacqueline [name] is a 84 y.o. female with medical history significant for Parkinson's disease, with associated dyskinesia, ambulant with a cane at baseline with history of falls, history of Parkinson's dementia, type 2 diabetes, CKD 3, hyperreactive airway on Symbicort and hypothyroidism who presented to the emergency room with persistent left hip pain following a fall on 07/22/2019, associated with decreased ability to ambulate.  According to the daughter who gives most of the history, patient fell while trying to transfer from walker to chair.  She has a right sided facial bruise resulting from the fall but she had no loss of consciousness.   ED Course: On arrival in the emergency room, vitals were within normal limits.  Blood work was significant for sodium of 132, glucose 479.  Creatinine was 1.15 but this is her baseline for her CKD.  EKG, normal sinus rhythm with no ST-T wave changes.  Hip x-ray showed acute subcapital left hip fracture.  CT head C-spine and maxillofacial pending at time of admission.  Hospital Course from Dr. Lenise Herald 2/24-2/25/21: Pt was found to have a closed left hip fracture secondary to a fall at home. Pt is s/p pinning on 07/30/19 by ortho surg (Dr. Roland Rack). Pt was put on lovenox for DVT prophylaxis x 14 days as per ortho surg. PT saw the pt and initially recommended SNF but then recommended home health. Home health was set up prior to d/c by CM. Pt will  be staying with her daughter.   Discharge Diagnoses:  Principal Problem:   Closed left hip fracture ([place]) Active Problems:   Type 2 diabetes mellitus with stage 3 chronic kidney disease (HCC)   Difficulty walking   Essential hypertension   Parkinson's disease ([place])   Fall at home, initial encounter   Acquired hypothyroidism   Dementia due to Parkinson's disease without behavioral disturbance ([place])   Reactive airway disease that is not asthma   Frequent falls   Pressure injury of skin   Goals of care, counseling/discussion   Palliative care by specialist  Closed left hip fracture: secondary to fall at home. Difficulty walking with frequent falls. S/p pinning on 07/30/19 by ortho surg. Norco, tramadol prn for pain. Zofran prn for nausea/vomiting   Parkinson's disease: continue on rasagiline, sinemet   Generalized weakness: PT initially recommended SNF but now recs [facility]    DM2:w/ CKDIII. HbA1c 7.4. Continue to hold home diabetic medication regimen. Continue on SSI w/ accuchecks   Facial bruise: likely secondary to fall at home. Appears stableand improving. CT maxillofacial  shows no acute pathology. Compresses, warm cold as needed for comfort.  Hypothyroidism: continue on levothyroxine  Dementia: due to Parkinson's disease. Continue Aricept and Namenda  Reactive airway disease:  not asthma. Continue Flonase and Symbicort  Discharge Instructions  Discharge Instructions    Diet - low sodium heart healthy   Complete by: As directed    Discharge instructions   Complete by: As directed    F/u PCP in 1-2 weeks; f/u ortho surg  in 2 weeks   Increase activity slowly   Complete by: As directed      Allergies as of 08/03/2019   No Known Allergies     Medication List    TAKE these medications   atorvastatin 10 MG tablet Commonly known as: Lipitor Take half tab po qd for high cholesterol Notes to patient: Not given in hospital   Azilect 1 MG Tabs tablet Generic  drug: rasagiline Take 1 mg by mouth daily. Notes to patient: Last dose given 02/25 at 9:05a   Basaglar KwikPen 100 UNIT/ML Sopn Inject 14 Units into the skin daily. Notes to patient: Not given in hospital   carbidopa-levodopa 25-100 MG tablet Commonly known as: SINEMET IR Take by mouth See admin instructions. Take 1 tablet by mouth at 10 am, take 1/2 tablet by mouth at 1 pm, take 1/2 tablet at 4 pm, take 1/2 tablet at 7 pm.   Carbidopa-Levodopa ER 25-100 MG tablet controlled release Commonly known as: SINEMET CR Take 1 tablet by mouth at bedtime.   donepezil 10 MG tablet Commonly known as: ARICEPT Take 10 mg by mouth at bedtime.   enoxaparin 40 MG/0.4ML injection Commonly known as: LOVENOX Inject 0.4 mLs (40 mg total) into the skin daily.   fluticasone 50 MCG/ACT nasal spray Commonly known as: FLONASE Place 1 spray into the nose daily.   HYDROcodone-acetaminophen 5-325 MG tablet Commonly known as: NORCO/VICODIN Take 1 tablet by mouth every 6 (six) hours as needed for severe pain.   levothyroxine 50 MCG tablet Commonly known as: SYNTHROID Take 50 mcg by mouth daily before breakfast.   memantine 10 MG tablet Commonly known as: NAMENDA Take 10 mg by mouth 2 (two) times daily.   multivitamin with minerals Tabs tablet Take 1 tablet by mouth daily.   omeprazole 40 MG capsule Commonly known as: PRILOSEC TAKE 1 CAPSULE BY MOUTH EVERY DAY   Symbicort 80-4.5 MCG/ACT inhaler Generic drug: budesonide-formoterol TAKE 2 PUFFS BY MOUTH TWICE A DAY            Durable Medical Equipment  (From admission, onward)         Start     Ordered   08/03/19 0920  For home use only DME Bedside commode  Once    Question:  Patient needs a bedside commode to treat with the following condition  Answer:  Weakness   08/03/19 0920          Contact information for follow-up providers    Lattie Corns, PA-C Follow up in 14 day(s).   Specialty: Physician Assistant Why: Electa Sniff information: [place] [address] [phone]            Contact information for after-discharge care    Destination    [facility] Preferred SNF .   Service: Skilled Nursing Contact information: [address] [place] [phone]                 No Known Allergies  Consultations:  Ortho surgery, Dr. Roland Rack   Procedures/Studies: CT Head Wo Contrast  Result Date: 07/29/2019 CLINICAL DATA:  Fall EXAM: CT HEAD WITHOUT CONTRAST CT MAXILLOFACIAL WITHOUT CONTRAST CT CERVICAL SPINE WITHOUT CONTRAST TECHNIQUE: Multidetector CT imaging of the head, cervical spine, and maxillofacial structures were performed using the standard protocol without intravenous contrast. Multiplanar CT image reconstructions of the cervical spine and maxillofacial structures were also generated. COMPARISON:  06/17/2019 FINDINGS: CT HEAD FINDINGS  Brain: There is no mass, hemorrhage or extra-axial collection. The size and configuration of the ventricles and extra-axial CSF spaces are normal. The brain parenchyma is normal, without evidence of acute or chronic infarction. Vascular: No abnormal hyperdensity of the major intracranial arteries or dural venous sinuses. No intracranial atherosclerosis. Skull: The visualized skull base, calvarium and extracranial soft tissues are normal. CT MAXILLOFACIAL FINDINGS Osseous: --Complex facial fracture types: No LeFort, zygomaticomaxillary complex or nasoorbitoethmoidal fracture. --Simple fracture types: None. --Mandible: No fracture or dislocation. Orbits: The globes are intact. Normal appearance of the intra- and extraconal fat. Symmetric extraocular muscles and optic nerves. Sinuses: Chronic left maxillary sinusitis. Soft tissues: Normal visualized extracranial soft tissues. CT CERVICAL SPINE FINDINGS Alignment: Grade 1 anterolisthesis at C4-5 and C5-6, most likely due to  facet arthrosis. Skull base and vertebrae: No acute fracture. Soft tissues and spinal canal: No prevertebral fluid or swelling. No visible canal hematoma. Disc levels: Fusion of the right C2-3 and left C3-4 facets. Severe facet arthrosis at left C4-7. No bony spinal canal stenosis. Upper chest: No pneumothorax, pulmonary nodule or pleural effusion. Other: Normal visualized paraspinal cervical soft tissues. IMPRESSION: 1. No acute intracranial abnormality. 2. No acute fracture or static subluxation of the cervical spine. 3. No facial fracture. Electronically Signed   By: Ulyses Jarred M.D.   On: 07/29/2019 20:32   CT Cervical Spine Wo Contrast  Result Date: 07/29/2019 CLINICAL DATA:  Fall EXAM: CT HEAD WITHOUT CONTRAST CT MAXILLOFACIAL WITHOUT CONTRAST CT CERVICAL SPINE WITHOUT CONTRAST TECHNIQUE: Multidetector CT imaging of the head, cervical spine, and maxillofacial structures were performed using the standard protocol without intravenous contrast. Multiplanar CT image reconstructions of the cervical spine and maxillofacial structures were also generated. COMPARISON:  06/17/2019 FINDINGS: CT HEAD FINDINGS Brain: There is no mass, hemorrhage or extra-axial collection. The size and configuration of the ventricles and extra-axial CSF spaces are normal. The brain parenchyma is normal, without evidence of acute or chronic infarction. Vascular: No abnormal hyperdensity of the major intracranial arteries or dural venous sinuses. No intracranial atherosclerosis. Skull: The visualized skull base, calvarium and extracranial soft tissues are normal. CT MAXILLOFACIAL FINDINGS Osseous: --Complex facial fracture types: No LeFort, zygomaticomaxillary complex or nasoorbitoethmoidal fracture. --Simple fracture types: None. --Mandible: No fracture or dislocation. Orbits: The globes are intact. Normal appearance of the intra- and extraconal fat. Symmetric extraocular muscles and optic nerves. Sinuses: Chronic left maxillary  sinusitis. Soft tissues: Normal visualized extracranial soft tissues. CT CERVICAL SPINE FINDINGS Alignment: Grade 1 anterolisthesis at C4-5 and C5-6, most likely due to facet arthrosis. Skull base and vertebrae: No acute fracture. Soft tissues and spinal canal: No prevertebral fluid or swelling. No visible canal hematoma. Disc levels: Fusion of the right C2-3 and left C3-4 facets. Severe facet arthrosis at left C4-7. No bony spinal canal stenosis. Upper chest: No pneumothorax, pulmonary nodule or pleural effusion. Other: Normal visualized paraspinal cervical soft tissues. IMPRESSION: 1. No acute intracranial abnormality. 2. No acute fracture or static subluxation of the cervical spine. 3. No facial fracture. Electronically Signed   By: Ulyses Jarred M.D.   On: 07/29/2019 20:32   MR BRAIN WO CONTRAST  Result Date: 07/31/2019 CLINICAL DATA:  Encephalopathy.  Rule out stroke. EXAM: MRI HEAD WITHOUT CONTRAST TECHNIQUE: Multiplanar, multiecho pulse sequences of the brain and surrounding structures were obtained without intravenous contrast. COMPARISON:  CT head 07/29/2019 FINDINGS: Brain: Motion degraded study.  Allowing for this, no acute infarct Generalized atrophy. Chronic microvascular ischemic changes in the white matter. Negative for hemorrhage  mass or fluid collection. Vascular: Normal arterial flow voids Skull and upper cervical spine: Negative Sinuses/Orbits: Mucosal thickening left maxillary sinus. Bilateral cataract extraction Other: None IMPRESSION: Motion degraded study.  No acute abnormality. Electronically Signed   By: Franchot Gallo M.D.   On: 07/31/2019 13:30   DG HIP OPERATIVE UNILAT W OR W/O PELVIS LEFT  Result Date: 07/30/2019 CLINICAL DATA:  Intra op left hip pinning EXAM: OPERATIVE LEFT HIP (WITH PELVIS IF PERFORMED) 2 VIEWS TECHNIQUE: Fluoroscopic spot image(s) were submitted for interpretation post-operatively. COMPARISON:  Left hip radiographs dated 07/29/2019 FINDINGS: Intraop  radiographs demonstrating 3 cannulated cancellous screws transfixing a subcapital left hip fracture. Fracture fragments are in near anatomic alignment and position. IMPRESSION: Status post ORIF of a left hip fracture, as above. Electronically Signed   By: Julian Hy M.D.   On: 07/30/2019 12:53   DG HIP UNILAT WITH PELVIS 2-3 VIEWS LEFT  Result Date: 07/29/2019 CLINICAL DATA:  Left hip pain for 3 days since a fall. Initial encounter. EXAM: DG HIP (WITH OR WITHOUT PELVIS) 2-3V LEFT COMPARISON:  Plain films pelvis right hip 06/17/2019. FINDINGS: There is an acute, mildly impacted subcapital fracture of the left hip. No other acute abnormality. IMPRESSION: Acute subcapital left hip fracture. Electronically Signed   By: Inge Rise M.D.   On: 07/29/2019 16:54   CT Maxillofacial WO CM  Result Date: 07/29/2019 CLINICAL DATA:  Fall EXAM: CT HEAD WITHOUT CONTRAST CT MAXILLOFACIAL WITHOUT CONTRAST CT CERVICAL SPINE WITHOUT CONTRAST TECHNIQUE: Multidetector CT imaging of the head, cervical spine, and maxillofacial structures were performed using the standard protocol without intravenous contrast. Multiplanar CT image reconstructions of the cervical spine and maxillofacial structures were also generated. COMPARISON:  06/17/2019 FINDINGS: CT HEAD FINDINGS Brain: There is no mass, hemorrhage or extra-axial collection. The size and configuration of the ventricles and extra-axial CSF spaces are normal. The brain parenchyma is normal, without evidence of acute or chronic infarction. Vascular: No abnormal hyperdensity of the major intracranial arteries or dural venous sinuses. No intracranial atherosclerosis. Skull: The visualized skull base, calvarium and extracranial soft tissues are normal. CT MAXILLOFACIAL FINDINGS Osseous: --Complex facial fracture types: No LeFort, zygomaticomaxillary complex or nasoorbitoethmoidal fracture. --Simple fracture types: None. --Mandible: No fracture or dislocation. Orbits: The  globes are intact. Normal appearance of the intra- and extraconal fat. Symmetric extraocular muscles and optic nerves. Sinuses: Chronic left maxillary sinusitis. Soft tissues: Normal visualized extracranial soft tissues. CT CERVICAL SPINE FINDINGS Alignment: Grade 1 anterolisthesis at C4-5 and C5-6, most likely due to facet arthrosis. Skull base and vertebrae: No acute fracture. Soft tissues and spinal canal: No prevertebral fluid or swelling. No visible canal hematoma. Disc levels: Fusion of the right C2-3 and left C3-4 facets. Severe facet arthrosis at left C4-7. No bony spinal canal stenosis. Upper chest: No pneumothorax, pulmonary nodule or pleural effusion. Other: Normal visualized paraspinal cervical soft tissues. IMPRESSION: 1. No acute intracranial abnormality. 2. No acute fracture or static subluxation of the cervical spine. 3. No facial fracture. Electronically Signed   By: Ulyses Jarred M.D.   On: 07/29/2019 20:32     Subjective: Pt c/o malaise   Discharge Exam: Vitals:   08/03/19 0557 08/03/19 0823  BP: (!) 172/98 (!) 155/82  Pulse: 80 79  Resp:  17  Temp:  97.6 F (36.4 C)  SpO2: 97% 98%   Vitals:   08/02/19 1520 08/03/19 0023 08/03/19 0557 08/03/19 0823  BP: (!) 159/82 (!) 148/83 (!) 172/98 (!) 155/82  Pulse: 87 75 80  79  Resp:  14  17  Temp: 98 F (36.7 C) 97.6 F (36.4 C)  97.6 F (36.4 C)  TempSrc:  Oral  Oral  SpO2: 96% 96% 97% 98%  Weight:      Height:        General: Pt is alert, awake, not in acute distress Cardiovascular: S1/S2 +, no rubs, no gallops Respiratory: CTA bilaterally, no wheezing, no rhonchi Abdominal: Soft, NT, ND, bowel sounds + Extremities: no edema, no cyanosis    The results of significant diagnostics from this hospitalization (including imaging, microbiology, ancillary and laboratory) are listed below for reference.     Microbiology: Recent Results (from the past 240 hour(s))  Respiratory Panel by RT PCR (Flu A&B, Covid) -  Nasopharyngeal Swab     Status: None   Collection Time: 07/29/19  7:47 PM   Specimen: Nasopharyngeal Swab  Result Value Ref Range Status   SARS Coronavirus 2 by RT PCR NEGATIVE NEGATIVE Final    Comment: (NOTE) SARS-CoV-2 target nucleic acids are NOT DETECTED. The SARS-CoV-2 RNA is generally detectable in upper respiratoy specimens during the acute phase of infection. The lowest concentration of SARS-CoV-2 viral copies this assay can detect is 131 copies/mL. A negative result does not preclude SARS-Cov-2 infection and should not be used as the sole basis for treatment or other patient management decisions. A negative result may occur with  improper specimen collection/handling, submission of specimen other than nasopharyngeal swab, presence of viral mutation(s) within the areas targeted by this assay, and inadequate number of viral copies (<131 copies/mL). A negative result must be combined with clinical observations, patient history, and epidemiological information. The expected result is Negative. Fact Sheet for Patients:  PinkCheek.be Fact Sheet for Healthcare Providers:  GravelBags.it This test is not yet ap proved or cleared by the [place] FDA and  has been authorized for detection and/or diagnosis of SARS-CoV-2 by FDA under an Emergency Use Authorization (EUA). This EUA will remain  in effect (meaning this test can be used) for the duration of the COVID-19 declaration under Section 564(b)(1) of the Act, 21 U.S.C. section 360bbb-3(b)(1), unless the authorization is terminated or revoked sooner.    Influenza A by PCR NEGATIVE NEGATIVE Final   Influenza B by PCR NEGATIVE NEGATIVE Final    Comment: (NOTE) The Xpert Xpress SARS-CoV-2/FLU/RSV assay is intended as an aid in  the diagnosis of influenza from Nasopharyngeal swab specimens and  should not be used as a sole basis for treatment. Nasal washings and  aspirates  are unacceptable for Xpert Xpress SARS-CoV-2/FLU/RSV  testing. Fact Sheet for Patients: PinkCheek.be Fact Sheet for Healthcare Providers: GravelBags.it This test is not yet approved or cleared by the [place] FDA and  has been authorized for detection and/or diagnosis of SARS-CoV-2 by  FDA under an Emergency Use Authorization (EUA). This EUA will remain  in effect (meaning this test can be used) for the duration of the  Covid-19 declaration under Section 564(b)(1) of the Act, 21  U.S.C. section 360bbb-3(b)(1), unless the authorization is  terminated or revoked. Performed at [facility], [address] [place], [place] [address]   Surgical PCR screen     Status: None   Collection Time: 07/30/19  6:58 AM   Specimen: Nasal Mucosa; Nasal Swab  Result Value Ref Range Status   MRSA, PCR NEGATIVE NEGATIVE Final   Staphylococcus aureus NEGATIVE NEGATIVE Final    Comment: (NOTE) The Xpert SA Assay (FDA approved for NASAL specimens in patients  37 years of age and older), is one component of a comprehensive surveillance program. It is not intended to diagnose infection nor to guide or monitor treatment. Performed at [facility], [address], [place], [place] [address]   Urine Culture     Status: Abnormal (Preliminary result)   Collection Time: 08/01/19  9:54 PM   Specimen: Urine, Random  Result Value Ref Range Status   Specimen Description   Final    URINE, RANDOM Performed at [facility], [address], [place], [place] [address]    Special Requests   Final    NONE Performed at [facility], [address], [place], [place] [address]    Culture (A)  Final    30,000 COLONIES/mL PSEUDOMONAS AERUGINOSA SUSCEPTIBILITIES TO FOLLOW Performed at [facility], [place] [address], [place], [place] [address]    Report Status PENDING  Incomplete     Labs: BNP (last 3  results) No results for input(s): BNP in the last 8760 hours. Basic Metabolic Panel: Recent Labs  Lab 07/29/19 1436 07/31/19 0346 08/02/19 0428 08/03/19 0458  NA 132* 138 141 141  K 4.5 3.9 4.3 3.7  CL 95* 102 101 100  CO2 29 29 29  35*  GLUCOSE 479* 176* 190* 176*  BUN 26* 15 22 24*  CREATININE 1.15* 0.78 0.89 0.77  CALCIUM 9.6 8.7* 9.4 9.4   Liver Function Tests: No results for input(s): AST, ALT, ALKPHOS, BILITOT, PROT, ALBUMIN in the last 168 hours. No results for input(s): LIPASE, AMYLASE in the last 168 hours. No results for input(s): AMMONIA in the last 168 hours. CBC: Recent Labs  Lab 07/29/19 1436 07/31/19 0346 08/01/19 0449 08/02/19 0428 08/03/19 0458  WBC 5.1 6.2 5.6 5.4 5.2  HGB 13.1 12.4 12.3 12.2 12.8  HCT 39.9 37.1 36.2 35.4* 39.1  MCV 91.5 90.3 88.9 89.6 90.5  PLT 187 177 180 185 215   Cardiac Enzymes: No results for input(s): CKTOTAL, CKMB, CKMBINDEX, TROPONINI in the last 168 hours. BNP: Invalid input(s): POCBNP CBG: Recent Labs  Lab 08/02/19 1155 08/02/19 1641 08/02/19 2229 08/03/19 0821 08/03/19 1130  GLUCAP 201* 248* 227* 190* 254*   D-Dimer No results for input(s): DDIMER in the last 72 hours. Hgb A1c No results for input(s): HGBA1C in the last 72 hours. Lipid Profile No results for input(s): CHOL, HDL, LDLCALC, TRIG, CHOLHDL, LDLDIRECT in the last 72 hours. Thyroid function studies No results for input(s): TSH, T4TOTAL, T3FREE, THYROIDAB in the last 72 hours.  Invalid input(s): FREET3 Anemia work up No results for input(s): VITAMINB12, FOLATE, FERRITIN, TIBC, IRON, RETICCTPCT in the last 72 hours. Urinalysis    Component Value Date/Time   COLORURINE AMBER (A) 08/01/2019 2154   APPEARANCEUR CLOUDY (A) 08/01/2019 2154   APPEARANCEUR Clear 06/20/2019 0000   LABSPEC 1.004 (L) 08/01/2019 2154   LABSPEC 1.031 08/11/2012 2247   PHURINE 6.0 08/01/2019 2154   GLUCOSEU NEGATIVE 08/01/2019 2154   GLUCOSEU 50 mg/dL 08/11/2012 2247    HGBUR LARGE (A) 08/01/2019 2154   BILIRUBINUR NEGATIVE 08/01/2019 2154   BILIRUBINUR Negative 06/20/2019 0000   BILIRUBINUR Negative 08/11/2012 [place] 08/01/2019 2154   PROTEINUR 100 (A) 08/01/2019 2154   NITRITE NEGATIVE 08/01/2019 2154   LEUKOCYTESUR NEGATIVE 08/01/2019 2154   LEUKOCYTESUR Negative 08/11/2012 2247   Sepsis Labs Invalid input(s): PROCALCITONIN,  WBC,  LACTICIDVEN Microbiology Recent Results (from the past 240 hour(s))  Respiratory Panel by RT PCR (Flu A&B, Covid) - Nasopharyngeal Swab  Status: None   Collection Time: 07/29/19  7:47 PM   Specimen: Nasopharyngeal Swab  Result Value Ref Range Status   SARS Coronavirus 2 by RT PCR NEGATIVE NEGATIVE Final    Comment: (NOTE) SARS-CoV-2 target nucleic acids are NOT DETECTED. The SARS-CoV-2 RNA is generally detectable in upper respiratoy specimens during the acute phase of infection. The lowest concentration of SARS-CoV-2 viral copies this assay can detect is 131 copies/mL. A negative result does not preclude SARS-Cov-2 infection and should not be used as the sole basis for treatment or other patient management decisions. A negative result may occur with  improper specimen collection/handling, submission of specimen other than nasopharyngeal swab, presence of viral mutation(s) within the areas targeted by this assay, and inadequate number of viral copies (<131 copies/mL). A negative result must be combined with clinical observations, patient history, and epidemiological information. The expected result is Negative. Fact Sheet for Patients:  PinkCheek.be Fact Sheet for Healthcare Providers:  GravelBags.it This test is not yet ap proved or cleared by the [place] FDA and  has been authorized for detection and/or diagnosis of SARS-CoV-2 by FDA under an Emergency Use Authorization (EUA). This EUA will remain  in effect (meaning this test  can be used) for the duration of the COVID-19 declaration under Section 564(b)(1) of the Act, 21 U.S.C. section 360bbb-3(b)(1), unless the authorization is terminated or revoked sooner.    Influenza A by PCR NEGATIVE NEGATIVE Final   Influenza B by PCR NEGATIVE NEGATIVE Final    Comment: (NOTE) The Xpert Xpress SARS-CoV-2/FLU/RSV assay is intended as an aid in  the diagnosis of influenza from Nasopharyngeal swab specimens and  should not be used as a sole basis for treatment. Nasal washings and  aspirates are unacceptable for Xpert Xpress SARS-CoV-2/FLU/RSV  testing. Fact Sheet for Patients: PinkCheek.be Fact Sheet for Healthcare Providers: GravelBags.it This test is not yet approved or cleared by the [place] FDA and  has been authorized for detection and/or diagnosis of SARS-CoV-2 by  FDA under an Emergency Use Authorization (EUA). This EUA will remain  in effect (meaning this test can be used) for the duration of the  Covid-19 declaration under Section 564(b)(1) of the Act, 21  U.S.C. section 360bbb-3(b)(1), unless the authorization is  terminated or revoked. Performed at [facility], [address] [place], [place] [address]   Surgical PCR screen     Status: None   Collection Time: 07/30/19  6:58 AM   Specimen: Nasal Mucosa; Nasal Swab  Result Value Ref Range Status   MRSA, PCR NEGATIVE NEGATIVE Final   Staphylococcus aureus NEGATIVE NEGATIVE Final    Comment: (NOTE) The Xpert SA Assay (FDA approved for NASAL specimens in patients 21 years of age and older), is one component of a comprehensive surveillance program. It is not intended to diagnose infection nor to guide or monitor treatment. Performed at [facility], [address] [place], [place] [address]   Urine Culture     Status: Abnormal (Preliminary result)   Collection Time: 08/01/19  9:54 PM   Specimen: Urine, Random  Result  Value Ref Range Status   Specimen Description   Final    URINE, RANDOM Performed at [facility], [address], [place], [place] [address]    Special Requests   Final    NONE Performed at [facility], [address] [place], [place] [address]    Culture (A)  Final    30,000 COLONIES/mL PSEUDOMONAS AERUGINOSA SUSCEPTIBILITIES TO  FOLLOW Performed at [facility], [address] [place], [place] [address]    Report Status PENDING  Incomplete     Time coordinating discharge: Over 30 minutes  SIGNED:   Wyvonnia Dusky, MD  [facility] 08/03/2019, 1:22 PM Pager   If 7PM-7AM, please contact night-coverage [email or web address]

## 2019-08-03 NOTE — Progress Notes (Signed)
Subjective: 4 Days Post-Op Procedure(s) (LRB): CANNULATED HIP PINNING (Left) Patient reports pain as mild.   Patient is well this morning.  Patient is walking with PT in the hallway without any significant pain or issues. Daughter at bedside this morning. Plan for discharge to home when appropriate. Negative for chest pain and shortness of breath Fever: no Gastrointestinal:Negative for nausea and vomiting  Objective: Vital signs in last 24 hours: Temp:  [97.6 F (36.4 C)-98 F (36.7 C)] 97.6 F (36.4 C) (02/25 0823) Pulse Rate:  [75-87] 79 (02/25 0823) Resp:  [14-17] 17 (02/25 0823) BP: (148-172)/(82-98) 155/82 (02/25 0823) SpO2:  [96 %-98 %] 98 % (02/25 0823)  Intake/Output from previous day:  Intake/Output Summary (Last 24 hours) at 08/03/2019 1301 Last data filed at 08/02/2019 1342 Gross per 24 hour  Intake 480 ml  Output -  Net 480 ml    Intake/Output this shift: No intake/output data recorded.  Labs: Recent Labs    08/01/19 0449 08/02/19 0428 08/03/19 0458  HGB 12.3 12.2 12.8   Recent Labs    08/02/19 0428 08/03/19 0458  WBC 5.4 5.2  RBC 3.95 4.32  HCT 35.4* 39.1  PLT 185 215   Recent Labs    08/02/19 0428 08/03/19 0458  NA 141 141  K 4.3 3.7  CL 101 100  CO2 29 35*  BUN 22 24*  CREATININE 0.89 0.77  GLUCOSE 190* 176*  CALCIUM 9.4 9.4   No results for input(s): LABPT, INR in the last 72 hours.   EXAM General - Patient is Alert and Appropriate Extremity - ABD soft Sensation intact distally Intact pulses distally Dorsiflexion/Plantar flexion intact Incision: dressing C/D/I No cellulitis present Dressing/Incision - clean, dry, no drainage Motor Function - intact, moving foot and toes well on exam.  Past Medical History:  Diagnosis Date  . Asthma   . Collapsed lung   . Dementia ([place])   . Diabetes mellitus without complication ([place])    Patient takes Insulin twice a day.   Marland Kitchen GERD (gastroesophageal reflux disease)   . Hyperlipidemia    . Hypertension   . Hypothyroidism   . Parkinson's disease ([place])   . Reactive airway disease     Assessment/Plan: 4 Days Post-Op Procedure(s) (LRB): CANNULATED HIP PINNING (Left) Principal Problem:   Closed left hip fracture (HCC) Active Problems:   Type 2 diabetes mellitus with stage 3 chronic kidney disease (HCC)   Difficulty walking   Essential hypertension   Parkinson's disease ([place])   Fall at home, initial encounter   Acquired hypothyroidism   Dementia due to Parkinson's disease without behavioral disturbance ([place])   Reactive airway disease that is not asthma   Frequent falls   Pressure injury of skin   Goals of care, counseling/discussion   Palliative care by specialist  Estimated body mass index is 18.42 kg/m as calculated from the following:   Height as of this encounter: 5\' 3"  (1.6 m).   Weight as of this encounter: 47.2 kg. Advance diet Up with therapy   Labs reviewed this AM. WBC 5.2, Hg 12.8 this AM. Up with therapy today, current plan is for discharge home. Patient has had multiple bowel movements. Plan for d/c to SNF when appropriate.  Upon discharge from hospital, follow-up with [facility] in two weeks for staple removal and x-rays. Lovenox 40mg  daily for 14 days following surgery for DVT prevention.  DVT Prophylaxis - Lovenox, Foot Pumps and TED hose Weight-Bearing as tolerated to left leg  J. Cameron Proud,  PA-C [facility] Orthopaedic Surgery 08/03/2019, 1:01 PM

## 2019-08-03 NOTE — TOC Transition Note (Signed)
Transition of Care (TOC) - CM/SW Discharge Note The patient to DC home with the daughter today with [facility] for [facility] PT, 3 in 1 in the room to go home with the patient,  No additional Needs  Patient Details  Name: Jacqueline [name] MRN: [id number] Date of Birth: Mar 30, 1936  Transition of Care [facility]) CM/SW Contact:  Su Hilt, RN Phone Number: 08/03/2019, 1:30 PM   Clinical Narrative:       Final next level of care: [place] Barriers to Discharge: Barriers Resolved   Patient Goals and CMS Choice Patient states their goals for this hospitalization and ongoing recovery are:: get stronger      Discharge Placement                       Discharge Plan and Services   Discharge Planning Services: CM Consult            DME Arranged: 3-N-1 DME Agency: [employer] Date DME Agency Contacted: 08/03/19 Time DME Agency Contacted: 1330 Representative spoke with at DME Agency: [place]: PT [place]: [place] ([place]) Date [place]: 08/02/19 Time [place]: 1330 Representative spoke with at [place]: [place] ([place]) Interventions     Readmission Risk Interventions No flowsheet data found.

## 2019-08-03 NOTE — Progress Notes (Signed)
Discharge summary reviewed with verbal understanding. Answered all questions. Escorted to personal vehicle. 

## 2019-08-03 NOTE — Progress Notes (Signed)
Physical Therapy Treatment Patient Details Name: Jacqueline [name] MRN: [id number] DOB: [birth year] Today's Date: 08/03/2019    History of Present Illness [place] Jacqueline [name] is an 25yoF who comes to [facility] 2/20 after 1 week decomposition of gait d/t progressive pain. Pt sustained a fall on 2/13 associated with LOC. XR reveals a left subcapital hip fracture. At baseline pt AMB with RW. PMH: Parkinsonism, dementia, DM, prior syncope, falls. Pt underwent cannulated hip pinning on 2/21. At baseline pt lives alone at home with caregivers inhome 7d/week until afterrnoon. Pt at baselien needs assist with ADL, and AMB household distances with [id number]. Pt reports falling ~1x/3-4 weeks.    PT Comments    Pt asleep in recliner upon entry, DTR reports pt has been asleep for most of morning minimal breakfast eaten. Pt awakened, familiar with Pryor Curia, agreeable to participate with encouragement, oriented to time of day. ModA to rise from chair, some weakness and difficulty getting fully upright. Pt able to progress AMB to 313ft, Pryor Curia maintains close hands-on supervision, but similar to yesterday pt has no LOB while AMB, good control of RW overall some intermittent cues during turns. Pt pleasant and interactive with staff in hallway throughout. Distance progressed to 356ft, minimal to moderate effort. Pt continues to progress well, DTR in attendance, both excited to be able to DC to home rather than STR. Pt has mobility limitation but is essentially 90-95% back to her baseline for mobility.      Follow Up Recommendations  Supervision for mobility/OOB;Home health PT     Equipment Recommendations  3in1 (PT)    Recommendations for Other Services       Precautions / Restrictions Precautions Precautions: Fall Restrictions LLE Weight Bearing: Weight bearing as tolerated    Mobility  Bed Mobility               General bed mobility comments: Up to chair upon arrival  Transfers Overall transfer level: Needs  assistance Equipment used: Rolling walker (2 wheeled) Transfers: Sit to/from Stand Sit to Stand: Mod assist         General transfer comment: more time for orientation to gravity, big LOB with first attempt to rise. authro stabilitzes for 20 seconds.  Ambulation/Gait Ambulation/Gait assistance: Min guard Gait Distance (Feet): 300 Feet Assistive device: Rolling walker (2 wheeled) Gait Pattern/deviations: WFL(Within Functional Limits)     General Gait Details: consistent effort, performance throughout. No major LOB, DTR comes along.   Stairs             Wheelchair Mobility    Modified Rankin (Stroke Patients Only)       Balance Overall balance assessment: Modified Independent;History of Falls                                          Cognition Arousal/Alertness: Awake/alert Behavior During Therapy: WFL for tasks assessed/performed Overall Cognitive Status: History of cognitive impairments - at baseline                                        Exercises      General Comments        Pertinent Vitals/Pain Pain Assessment: Faces Faces Pain Scale: Hurts a little bit Pain Location: Left hip Pain Descriptors / Indicators: Aching;Sore Pain Intervention(s): Limited activity within patient's tolerance;Monitored  during session;Premedicated before session    Home Living                      Prior Function            PT Goals (current goals can now be found in the care plan section) Acute Rehab PT Goals Patient Stated Goal: achieve baseline independence with mobility. PT Goal Formulation: With patient Time For Goal Achievement: 08/14/19 Potential to Achieve Goals: Good Progress towards PT goals: Progressing toward goals    Frequency    BID      PT Plan Discharge plan needs to be updated;Equipment recommendations need to be updated    Co-evaluation              AM-PAC PT "6 Clicks" Mobility   Outcome  Measure  Help needed turning from your back to your side while in a flat bed without using bedrails?: A Lot Help needed moving from lying on your back to sitting on the side of a flat bed without using bedrails?: A Lot Help needed moving to and from a bed to a chair (including a wheelchair)?: A Little Help needed standing up from a chair using your arms (e.g., wheelchair or bedside chair)?: A Little Help needed to walk in hospital room?: A Little Help needed climbing 3-5 steps with a railing? : A Little 6 Click Score: 16    End of Session Equipment Utilized During Treatment: Gait belt Activity Tolerance: Patient tolerated treatment well;No increased pain Patient left: with call bell/phone within reach;with family/visitor present;in bed Nurse Communication: Mobility status PT Visit Diagnosis: Unsteadiness on feet (R26.81);Other abnormalities of gait and mobility (R26.89);Difficulty in walking, not elsewhere classified (R26.2);History of falling (Z91.81)     Time: [id number] PT Time Calculation (min) (ACUTE ONLY): 13 min  Charges:  $Gait Training: 8-22 mins                     12:37 PM, 08/03/19 Etta Grandchild, PT, DPT Physical Therapist - [facility]  [phone] ([place])    Shanaya Schneck C 08/03/2019, 12:33 PM

## 2019-08-03 NOTE — Progress Notes (Signed)
Pt's daughter stated to staff that pt would not be taking the Lovenox injection at home. This Probation officer provided extensive education to pt and family on administration and indication. Pt and daughter appeared receptive to teaching.

## 2019-08-03 NOTE — Progress Notes (Signed)
Physical Therapy Treatment Patient Details Name: Jacqueline [name] MRN: [id number] DOB: 1936/05/08 Today's Date: 08/03/2019    History of Present Illness Jacqueline [name] is an 56yoF who comes to [facility] 2/20 after 1 week decomposition of gait d/t progressive pain. Pt sustained a fall on 2/13 associated with LOC. XR reveals a left subcapital hip fracture. At baseline pt AMB with RW. PMH: Parkinsonism, dementia, DM, prior syncope, falls. Pt underwent cannulated hip pinning on 2/21. At baseline pt lives alone at home with caregivers inhome 7d/week until afterrnoon. Pt at baselien needs assist with ADL, and AMB household distances with [id number]. Pt reports falling ~1x/3-4 weeks.    PT Comments    Pt was seated in recliner upon arriving. She agrees to PT session and is cooperative throughout. Pt's daughter present throughout session and will be home with her at D/C. Pt was able to stand with min assist and ambulated 200 ft with RW + CGA. Gait belt previously issued to daughter to use at home for safety. Constant VCs throughout for safety and to slow down. Pt slightly impulsive with only fair safety awareness. She performed stair training for 2nd time today and demonstrated steady ability to perform with Min assist. Both pt/pt's daughter feel confident in ability to perform at home. Once pt returned to rm, requested to use BR however unsuccessful. She was repositioned in recliner post session with call bell in reach and daughter at bedside. Pt is to D/C home this afternoon per MD with HHPT to follow.     Follow Up Recommendations  Supervision for mobility/OOB;Home health PT     Equipment Recommendations  3in1 (PT)    Recommendations for Other Services       Precautions / Restrictions Precautions Precautions: Fall Restrictions Weight Bearing Restrictions: Yes LLE Weight Bearing: Weight bearing as tolerated    Mobility  Bed Mobility               General bed mobility comments: Pt seated in  recliner pre/post session'  Transfers Overall transfer level: Needs assistance Equipment used: Rolling walker (2 wheeled) Transfers: Sit to/from Stand Sit to Stand: Min assist         General transfer comment: Min assist to stand from recliner with vcs for handplacement and overall technique improvements. She was also able to STS from elevated toilet chair with CGA for safety. gait belt used throughotu session.  Ambulation/Gait Ambulation/Gait assistance: Min guard Gait Distance (Feet): 200 Feet Assistive device: Rolling walker (2 wheeled) Gait Pattern/deviations: WFL(Within Functional Limits)     General Gait Details: consistent effort, performance throughout. No major LOB, DTR comes along.   Stairs Stairs: Yes Stairs assistance: Min assist Stair Management: Two rails;Sideways Number of Stairs: 5 General stair comments: Pt was able to ascend/descend 5 stair with 1 rail. pt's daughter present throughout session and will be with pt when performing stairs   Wheelchair Mobility    Modified Rankin (Stroke Patients Only)       Balance Overall balance assessment: Modified Independent;History of Falls                                          Cognition Arousal/Alertness: Awake/alert Behavior During Therapy: WFL for tasks assessed/performed Overall Cognitive Status: History of cognitive impairments - at baseline  General Comments: Pt very cooperative and pleasant. agrees to PT session and is excited to D/C home with daughter this afternoon      Exercises      General Comments        Pertinent Vitals/Pain Pain Assessment: 0-10 Pain Score: 3  Faces Pain Scale: Hurts a little bit Pain Location: Left hip Pain Descriptors / Indicators: Aching;Sore Pain Intervention(s): Limited activity within patient's tolerance;Monitored during session    Home Living                      Prior Function             PT Goals (current goals can now be found in the care plan section) Acute Rehab PT Goals Patient Stated Goal: To go home safe today PT Goal Formulation: With patient Time For Goal Achievement: 08/14/19 Potential to Achieve Goals: Good Progress towards PT goals: Progressing toward goals    Frequency    BID      PT Plan Current plan remains appropriate    Co-evaluation              AM-PAC PT "6 Clicks" Mobility   Outcome Measure  Help needed turning from your back to your side while in a flat bed without using bedrails?: A Lot Help needed moving from lying on your back to sitting on the side of a flat bed without using bedrails?: A Lot Help needed moving to and from a bed to a chair (including a wheelchair)?: A Little Help needed standing up from a chair using your arms (e.g., wheelchair or bedside chair)?: A Little Help needed to walk in hospital room?: A Little Help needed climbing 3-5 steps with a railing? : A Little 6 Click Score: 16    End of Session Equipment Utilized During Treatment: Gait belt Activity Tolerance: Patient tolerated treatment well;No increased pain Patient left: in chair;with call bell/phone within reach;with chair alarm set;with family/visitor present Nurse Communication: Mobility status PT Visit Diagnosis: Unsteadiness on feet (R26.81);Other abnormalities of gait and mobility (R26.89);Difficulty in walking, not elsewhere classified (R26.2);History of falling (Z91.81)     Time: 1250-1315 PT Time Calculation (min) (ACUTE ONLY): 25 min  Charges:  $Gait Training: 8-22 mins $Therapeutic Activity: 8-22 mins                    Julaine Fusi PTA 08/03/19, 2:05 PM

## 2019-08-04 DIAGNOSIS — S72002A Fracture of unspecified part of neck of left femur, initial encounter for closed fracture: Secondary | ICD-10-CM | POA: Diagnosis not present

## 2019-08-05 DIAGNOSIS — N183 Chronic kidney disease, stage 3 unspecified: Secondary | ICD-10-CM | POA: Diagnosis not present

## 2019-08-05 DIAGNOSIS — S0083XD Contusion of other part of head, subsequent encounter: Secondary | ICD-10-CM | POA: Diagnosis not present

## 2019-08-05 DIAGNOSIS — J988 Other specified respiratory disorders: Secondary | ICD-10-CM | POA: Diagnosis not present

## 2019-08-05 DIAGNOSIS — I129 Hypertensive chronic kidney disease with stage 1 through stage 4 chronic kidney disease, or unspecified chronic kidney disease: Secondary | ICD-10-CM | POA: Diagnosis not present

## 2019-08-05 DIAGNOSIS — E1165 Type 2 diabetes mellitus with hyperglycemia: Secondary | ICD-10-CM | POA: Diagnosis not present

## 2019-08-05 DIAGNOSIS — E1122 Type 2 diabetes mellitus with diabetic chronic kidney disease: Secondary | ICD-10-CM | POA: Diagnosis not present

## 2019-08-05 DIAGNOSIS — G20 Parkinson's disease: Secondary | ICD-10-CM | POA: Diagnosis not present

## 2019-08-05 DIAGNOSIS — S72002D Fracture of unspecified part of neck of left femur, subsequent encounter for closed fracture with routine healing: Secondary | ICD-10-CM | POA: Diagnosis not present

## 2019-08-05 DIAGNOSIS — R69 Illness, unspecified: Secondary | ICD-10-CM | POA: Diagnosis not present

## 2019-08-05 DIAGNOSIS — K219 Gastro-esophageal reflux disease without esophagitis: Secondary | ICD-10-CM | POA: Diagnosis not present

## 2019-08-07 ENCOUNTER — Ambulatory Visit: Payer: Medicare HMO | Admitting: Speech Pathology

## 2019-08-08 DIAGNOSIS — S72002D Fracture of unspecified part of neck of left femur, subsequent encounter for closed fracture with routine healing: Secondary | ICD-10-CM | POA: Diagnosis not present

## 2019-08-08 DIAGNOSIS — E1122 Type 2 diabetes mellitus with diabetic chronic kidney disease: Secondary | ICD-10-CM | POA: Diagnosis not present

## 2019-08-08 DIAGNOSIS — E1165 Type 2 diabetes mellitus with hyperglycemia: Secondary | ICD-10-CM | POA: Diagnosis not present

## 2019-08-08 DIAGNOSIS — K219 Gastro-esophageal reflux disease without esophagitis: Secondary | ICD-10-CM | POA: Diagnosis not present

## 2019-08-08 DIAGNOSIS — I129 Hypertensive chronic kidney disease with stage 1 through stage 4 chronic kidney disease, or unspecified chronic kidney disease: Secondary | ICD-10-CM | POA: Diagnosis not present

## 2019-08-08 DIAGNOSIS — R69 Illness, unspecified: Secondary | ICD-10-CM | POA: Diagnosis not present

## 2019-08-08 DIAGNOSIS — S0083XD Contusion of other part of head, subsequent encounter: Secondary | ICD-10-CM | POA: Diagnosis not present

## 2019-08-08 DIAGNOSIS — N183 Chronic kidney disease, stage 3 unspecified: Secondary | ICD-10-CM | POA: Diagnosis not present

## 2019-08-08 DIAGNOSIS — J988 Other specified respiratory disorders: Secondary | ICD-10-CM | POA: Diagnosis not present

## 2019-08-08 DIAGNOSIS — G20 Parkinson's disease: Secondary | ICD-10-CM | POA: Diagnosis not present

## 2019-08-09 ENCOUNTER — Ambulatory Visit: Payer: Medicare HMO | Admitting: Speech Pathology

## 2019-08-10 DIAGNOSIS — S72002D Fracture of unspecified part of neck of left femur, subsequent encounter for closed fracture with routine healing: Secondary | ICD-10-CM | POA: Diagnosis not present

## 2019-08-10 DIAGNOSIS — K219 Gastro-esophageal reflux disease without esophagitis: Secondary | ICD-10-CM | POA: Diagnosis not present

## 2019-08-10 DIAGNOSIS — S0083XD Contusion of other part of head, subsequent encounter: Secondary | ICD-10-CM | POA: Diagnosis not present

## 2019-08-10 DIAGNOSIS — R69 Illness, unspecified: Secondary | ICD-10-CM | POA: Diagnosis not present

## 2019-08-10 DIAGNOSIS — I129 Hypertensive chronic kidney disease with stage 1 through stage 4 chronic kidney disease, or unspecified chronic kidney disease: Secondary | ICD-10-CM | POA: Diagnosis not present

## 2019-08-10 DIAGNOSIS — N183 Chronic kidney disease, stage 3 unspecified: Secondary | ICD-10-CM | POA: Diagnosis not present

## 2019-08-10 DIAGNOSIS — G20 Parkinson's disease: Secondary | ICD-10-CM | POA: Diagnosis not present

## 2019-08-10 DIAGNOSIS — E1165 Type 2 diabetes mellitus with hyperglycemia: Secondary | ICD-10-CM | POA: Diagnosis not present

## 2019-08-10 DIAGNOSIS — J988 Other specified respiratory disorders: Secondary | ICD-10-CM | POA: Diagnosis not present

## 2019-08-10 DIAGNOSIS — E1122 Type 2 diabetes mellitus with diabetic chronic kidney disease: Secondary | ICD-10-CM | POA: Diagnosis not present

## 2019-08-14 ENCOUNTER — Ambulatory Visit: Payer: Medicare HMO | Admitting: Speech Pathology

## 2019-08-15 ENCOUNTER — Ambulatory Visit: Payer: Medicare HMO | Admitting: Internal Medicine

## 2019-08-16 ENCOUNTER — Ambulatory Visit (INDEPENDENT_AMBULATORY_CARE_PROVIDER_SITE_OTHER): Payer: Medicare HMO | Admitting: Internal Medicine

## 2019-08-16 ENCOUNTER — Encounter: Payer: Self-pay | Admitting: Internal Medicine

## 2019-08-16 ENCOUNTER — Ambulatory Visit: Payer: Medicare HMO | Admitting: Speech Pathology

## 2019-08-16 VITALS — BP 138/76 | Ht 61.5 in | Wt 104.0 lb

## 2019-08-16 DIAGNOSIS — E1122 Type 2 diabetes mellitus with diabetic chronic kidney disease: Secondary | ICD-10-CM | POA: Diagnosis not present

## 2019-08-16 DIAGNOSIS — E1165 Type 2 diabetes mellitus with hyperglycemia: Secondary | ICD-10-CM | POA: Diagnosis not present

## 2019-08-16 DIAGNOSIS — E119 Type 2 diabetes mellitus without complications: Secondary | ICD-10-CM

## 2019-08-16 DIAGNOSIS — I129 Hypertensive chronic kidney disease with stage 1 through stage 4 chronic kidney disease, or unspecified chronic kidney disease: Secondary | ICD-10-CM | POA: Diagnosis not present

## 2019-08-16 DIAGNOSIS — J988 Other specified respiratory disorders: Secondary | ICD-10-CM | POA: Diagnosis not present

## 2019-08-16 DIAGNOSIS — G20 Parkinson's disease: Secondary | ICD-10-CM | POA: Diagnosis not present

## 2019-08-16 DIAGNOSIS — R69 Illness, unspecified: Secondary | ICD-10-CM | POA: Diagnosis not present

## 2019-08-16 DIAGNOSIS — G20A1 Parkinson's disease without dyskinesia, without mention of fluctuations: Secondary | ICD-10-CM

## 2019-08-16 DIAGNOSIS — Z794 Long term (current) use of insulin: Secondary | ICD-10-CM | POA: Diagnosis not present

## 2019-08-16 DIAGNOSIS — K219 Gastro-esophageal reflux disease without esophagitis: Secondary | ICD-10-CM | POA: Diagnosis not present

## 2019-08-16 DIAGNOSIS — N183 Chronic kidney disease, stage 3 unspecified: Secondary | ICD-10-CM | POA: Diagnosis not present

## 2019-08-16 DIAGNOSIS — S72002D Fracture of unspecified part of neck of left femur, subsequent encounter for closed fracture with routine healing: Secondary | ICD-10-CM

## 2019-08-16 DIAGNOSIS — S0083XD Contusion of other part of head, subsequent encounter: Secondary | ICD-10-CM | POA: Diagnosis not present

## 2019-08-16 DIAGNOSIS — R296 Repeated falls: Secondary | ICD-10-CM | POA: Diagnosis not present

## 2019-08-16 NOTE — Progress Notes (Signed)
[facility] [place], [place] [address]  Internal MEDICINE  Telephone Visit  Patient Name: Jacqueline [name]  [id number]  [id number]  Date of Service: 08/20/2019  Pt is connected via audio visit  I discussed the limitations, risks, security and privacy concerns of performing an evaluation and management service by telephone and the availability of in person appointments. I also discussed with the patient that there may be a patient responsible charge related to the service.  The patient expressed understanding and agrees to proceed.    Chief Complaint  Patient presents with  . Telephone Assessment  . Telephone Screen  . Hospitalization Follow-up    fall, syncope, fracture of left hip   . Diabetes  . Hypertension  . Hypothyroidism  . Hyperlipidemia  . Gastroesophageal Reflux  . Medication Management    never started on lipitor    I connected with the patient at 1140 am  by telephone and verified the patients identity using two identifiers. She is with her daughters. Staying home at the moment. She is having increased falls due to balance issues. Her parkinson is getting worse. She is not taking Lipitor due to side effects. She was hospitalized for left hip fracture after a fall  Hospital Course from Dr. Lenise Herald 2/24-2/25/21: Pt was found to have a closed left hip fracture secondary to a fall at home. Pt is s/p pinning on 07/30/19 by ortho surg (Dr. Roland Rack). Pt was put on lovenox for DVT prophylaxis x 14 days as per ortho surg. PT saw the pt and initially recommended SNF but then recommended home health. Home health was set up prior to d/c by CM. Pt will be staying with her daughter.  Daughter is looking into some social work help with placement. However for now PT has evaluated her home living condition and want to make sure it is safe for her. She does have her bedroom at 2nd floor . Diabetes is under good control, no events related to hypoglycemia  All  medications are reviewed  Current Medication: Outpatient Encounter Medications as of 08/16/2019  Medication Sig Note  . carbidopa-levodopa (SINEMET IR) 25-100 MG tablet Take by mouth See admin instructions. Take 1 tablet by mouth at 10 am, take 1/2 tablet by mouth at 1 pm, take 1/2 tablet at 4 pm, take 1/2 tablet at 7 pm. 07/29/2019: Takes with sinemet cr Daughter states 1,1/2,1/2/,1/2 directions at pharmacy is 1,1/2,1/2,1/2,1/2  . Carbidopa-Levodopa ER (SINEMET CR) 25-100 MG tablet controlled release Take 1 tablet by mouth at bedtime.   . donepezil (ARICEPT) 10 MG tablet Take 10 mg by mouth at bedtime.   . enoxaparin (LOVENOX) 40 MG/0.4ML injection Inject 0.4 mLs (40 mg total) into the skin daily.   . fluticasone (FLONASE) 50 MCG/ACT nasal spray Place 1 spray into the nose daily.    Marland Kitchen HYDROcodone-acetaminophen (NORCO/VICODIN) 5-325 MG tablet Take 1 tablet by mouth every 6 (six) hours as needed for severe pain.   . Insulin Glargine (BASAGLAR KWIKPEN) 100 UNIT/ML SOPN Inject 14 Units into the skin daily.   Marland Kitchen levothyroxine (SYNTHROID, LEVOTHROID) 50 MCG tablet Take 50 mcg by mouth daily before breakfast.   . Multiple Vitamin (MULTIVITAMIN WITH MINERALS) TABS tablet Take 1 tablet by mouth daily.   Marland Kitchen omeprazole (PRILOSEC) 40 MG capsule TAKE 1 CAPSULE BY MOUTH EVERY DAY   . rasagiline (AZILECT) 1 MG TABS tablet Take 1 mg by mouth daily.   . SYMBICORT 80-4.5 MCG/ACT inhaler TAKE 2 PUFFS BY MOUTH  TWICE A DAY   . memantine (NAMENDA) 10 MG tablet Take 10 mg by mouth 2 (two) times daily.    . [DISCONTINUED] atorvastatin (LIPITOR) 10 MG tablet Take half tab po qd for high cholesterol (Patient not taking: Reported on 08/16/2019)    No facility-administered encounter medications on file as of 08/16/2019.    Surgical History: Past Surgical History:  Procedure Laterality Date  . ABDOMINAL HYSTERECTOMY    . CHOLECYSTECTOMY    . COLONOSCOPY WITH PROPOFOL N/A 03/29/2015   Procedure: COLONOSCOPY WITH PROPOFOL;   Surgeon: Hulen Luster, MD;  Location: [facility] ENDOSCOPY;  Service: Gastroenterology;  Laterality: N/A;  . ESOPHAGOGASTRODUODENOSCOPY (EGD) WITH PROPOFOL N/A 03/29/2015   Procedure: ESOPHAGOGASTRODUODENOSCOPY (EGD) WITH PROPOFOL;  Surgeon: Hulen Luster, MD;  Location[facility] ENDOSCOPY;  Service: Gastroenterology;  Laterality: N/A;  . HIP PINNING,CANNULATED Left 07/30/2019   Procedure: CANNULATED HIP PINNING;  Surgeon: Corky Mull, MD;  Location: [facility] ORS;  Service: Orthopedics;  Laterality: Left;  . lung collapse     broken rib from fall punctured lung    Medical History: Past Medical History:  Diagnosis Date  . Asthma   . Collapsed lung   . Dementia ([place])   . Diabetes mellitus without complication ([place])    Patient takes Insulin twice a day.   Marland Kitchen GERD (gastroesophageal reflux disease)   . Hyperlipidemia   . Hypertension   . Hypothyroidism   . Parkinson's disease ([place])   . Reactive airway disease     Family History: Family History  Problem Relation Age of Onset  . Bladder Cancer Neg Hx   . Kidney cancer Neg Hx     Social History   Socioeconomic History  . Marital status: Widowed    Spouse name: Not on file  . Number of children: Not on file  . Years of education: Not on file  . Highest education level: Not on file  Occupational History  . Not on file  Tobacco Use  . Smoking status: Never Smoker  . Smokeless tobacco: Never Used  Substance and Sexual Activity  . Alcohol use: Not Currently  . Drug use: No  . Sexual activity: Not on file  Other Topics Concern  . Not on file  Social History Narrative  . Not on file   Social Determinants of Health   Financial Resource Strain:   . Difficulty of Paying Living Expenses:   Food Insecurity:   . Worried About Charity fundraiser in the Last Year:   . Arboriculturist in the Last Year:   Transportation Needs:   . Film/video editor (Medical):   Marland Kitchen Lack of Transportation (Non-Medical):   Physical Activity:   . Days of Exercise  per Week:   . Minutes of Exercise per Session:   Stress:   . Feeling of Stress :   Social Connections:   . Frequency of Communication with Friends and Family:   . Frequency of Social Gatherings with Friends and Family:   . Attends Religious Services:   . Active Member of Clubs or Organizations:   . Attends Archivist Meetings:   Marland Kitchen Marital Status:   Intimate Partner Violence:   . Fear of Current or Ex-Partner:   . Emotionally Abused:   Marland Kitchen Physically Abused:   . Sexually Abused:     Review of Systems  Constitutional: Positive for fatigue. Negative for chills.  HENT: Negative for sinus pressure.   Eyes: Negative for visual disturbance.  Respiratory: Negative  for cough.   Cardiovascular: Negative for palpitations and leg swelling.  Gastrointestinal: Negative for abdominal pain, constipation, diarrhea, nausea and vomiting.  Genitourinary: Negative for flank pain.  Musculoskeletal: Positive for arthralgias and gait problem. Negative for back pain and neck pain.       Frequent falls   Skin: Negative for color change.  Neurological: Positive for tremors.  Psychiatric/Behavioral: Negative for agitation and hallucinations. Behavioral problem: depression.    Vital Signs: BP 138/76   Ht 5' 1.5" (1.562 m)   Wt 104 lb (47.2 kg)   BMI 19.33 kg/m    Observation/Objective: Able to communicate well   Assessment/Plan: 1. Frequent falls Discussion about safety and multiple sitting stations around the house, assisted living is an option. Continue to work with PT  2. Closed fracture of left hip with routine healing, subsequent encounter She is able to walk, follow up with ortho   3. Type 2 diabetes mellitus without complication, with long-term current use of insulin (HCC) Controlled   4. Parkinson's disease ([place]) Disease is uncontrolled with side effects of medications.   General Counseling: Jacqueline [name] verbalizes understanding of the findings of today's phone visit and agrees  with plan of treatment. I have discussed any further diagnostic evaluation that may be needed or ordered today. We also reviewed her medications today. she has been encouraged to call the office with any questions or concerns that should arise related to todays visit.  All hospital records reviwed and answered questions ( 35 min )    Time spent:35 Minutes  Fall Risk  06/20/2019 01/13/2019 06/16/2018 03/29/2018 09/22/2017  Falls in the past year? 1 1 0 No Yes  Number falls in past yr: 1 1 - - 2 or more  Injury with Fall? 1 1 - - No   Dr Lavera Guise Internal medicine

## 2019-08-17 DIAGNOSIS — J988 Other specified respiratory disorders: Secondary | ICD-10-CM | POA: Diagnosis not present

## 2019-08-17 DIAGNOSIS — K219 Gastro-esophageal reflux disease without esophagitis: Secondary | ICD-10-CM | POA: Diagnosis not present

## 2019-08-17 DIAGNOSIS — G20 Parkinson's disease: Secondary | ICD-10-CM | POA: Diagnosis not present

## 2019-08-17 DIAGNOSIS — R69 Illness, unspecified: Secondary | ICD-10-CM | POA: Diagnosis not present

## 2019-08-17 DIAGNOSIS — E1122 Type 2 diabetes mellitus with diabetic chronic kidney disease: Secondary | ICD-10-CM | POA: Diagnosis not present

## 2019-08-17 DIAGNOSIS — E1165 Type 2 diabetes mellitus with hyperglycemia: Secondary | ICD-10-CM | POA: Diagnosis not present

## 2019-08-17 DIAGNOSIS — N183 Chronic kidney disease, stage 3 unspecified: Secondary | ICD-10-CM | POA: Diagnosis not present

## 2019-08-17 DIAGNOSIS — S72002D Fracture of unspecified part of neck of left femur, subsequent encounter for closed fracture with routine healing: Secondary | ICD-10-CM | POA: Diagnosis not present

## 2019-08-17 DIAGNOSIS — I129 Hypertensive chronic kidney disease with stage 1 through stage 4 chronic kidney disease, or unspecified chronic kidney disease: Secondary | ICD-10-CM | POA: Diagnosis not present

## 2019-08-17 DIAGNOSIS — S0083XD Contusion of other part of head, subsequent encounter: Secondary | ICD-10-CM | POA: Diagnosis not present

## 2019-08-21 ENCOUNTER — Other Ambulatory Visit: Payer: Self-pay | Admitting: Adult Health

## 2019-08-21 ENCOUNTER — Ambulatory Visit: Payer: Medicare HMO | Admitting: Speech Pathology

## 2019-08-23 ENCOUNTER — Ambulatory Visit: Payer: Medicare HMO | Admitting: Speech Pathology

## 2019-08-23 DIAGNOSIS — I129 Hypertensive chronic kidney disease with stage 1 through stage 4 chronic kidney disease, or unspecified chronic kidney disease: Secondary | ICD-10-CM | POA: Diagnosis not present

## 2019-08-23 DIAGNOSIS — S0083XD Contusion of other part of head, subsequent encounter: Secondary | ICD-10-CM | POA: Diagnosis not present

## 2019-08-23 DIAGNOSIS — J988 Other specified respiratory disorders: Secondary | ICD-10-CM | POA: Diagnosis not present

## 2019-08-23 DIAGNOSIS — N183 Chronic kidney disease, stage 3 unspecified: Secondary | ICD-10-CM | POA: Diagnosis not present

## 2019-08-23 DIAGNOSIS — E1122 Type 2 diabetes mellitus with diabetic chronic kidney disease: Secondary | ICD-10-CM | POA: Diagnosis not present

## 2019-08-23 DIAGNOSIS — G20 Parkinson's disease: Secondary | ICD-10-CM | POA: Diagnosis not present

## 2019-08-23 DIAGNOSIS — S72002D Fracture of unspecified part of neck of left femur, subsequent encounter for closed fracture with routine healing: Secondary | ICD-10-CM | POA: Diagnosis not present

## 2019-08-23 DIAGNOSIS — R69 Illness, unspecified: Secondary | ICD-10-CM | POA: Diagnosis not present

## 2019-08-23 DIAGNOSIS — E1165 Type 2 diabetes mellitus with hyperglycemia: Secondary | ICD-10-CM | POA: Diagnosis not present

## 2019-08-23 DIAGNOSIS — K219 Gastro-esophageal reflux disease without esophagitis: Secondary | ICD-10-CM | POA: Diagnosis not present

## 2019-08-25 DIAGNOSIS — G20 Parkinson's disease: Secondary | ICD-10-CM | POA: Diagnosis not present

## 2019-08-25 DIAGNOSIS — S0083XD Contusion of other part of head, subsequent encounter: Secondary | ICD-10-CM | POA: Diagnosis not present

## 2019-08-25 DIAGNOSIS — J988 Other specified respiratory disorders: Secondary | ICD-10-CM | POA: Diagnosis not present

## 2019-08-25 DIAGNOSIS — R69 Illness, unspecified: Secondary | ICD-10-CM | POA: Diagnosis not present

## 2019-08-25 DIAGNOSIS — N183 Chronic kidney disease, stage 3 unspecified: Secondary | ICD-10-CM | POA: Diagnosis not present

## 2019-08-25 DIAGNOSIS — I129 Hypertensive chronic kidney disease with stage 1 through stage 4 chronic kidney disease, or unspecified chronic kidney disease: Secondary | ICD-10-CM | POA: Diagnosis not present

## 2019-08-25 DIAGNOSIS — E1122 Type 2 diabetes mellitus with diabetic chronic kidney disease: Secondary | ICD-10-CM | POA: Diagnosis not present

## 2019-08-25 DIAGNOSIS — E1165 Type 2 diabetes mellitus with hyperglycemia: Secondary | ICD-10-CM | POA: Diagnosis not present

## 2019-08-25 DIAGNOSIS — K219 Gastro-esophageal reflux disease without esophagitis: Secondary | ICD-10-CM | POA: Diagnosis not present

## 2019-08-25 DIAGNOSIS — S72002D Fracture of unspecified part of neck of left femur, subsequent encounter for closed fracture with routine healing: Secondary | ICD-10-CM | POA: Diagnosis not present

## 2019-08-28 ENCOUNTER — Ambulatory Visit: Payer: Medicare HMO | Admitting: Speech Pathology

## 2019-08-30 ENCOUNTER — Ambulatory Visit: Payer: Medicare HMO | Admitting: Speech Pathology

## 2019-08-30 DIAGNOSIS — S72002D Fracture of unspecified part of neck of left femur, subsequent encounter for closed fracture with routine healing: Secondary | ICD-10-CM | POA: Diagnosis not present

## 2019-08-30 DIAGNOSIS — S0083XD Contusion of other part of head, subsequent encounter: Secondary | ICD-10-CM | POA: Diagnosis not present

## 2019-08-30 DIAGNOSIS — J988 Other specified respiratory disorders: Secondary | ICD-10-CM | POA: Diagnosis not present

## 2019-08-30 DIAGNOSIS — R69 Illness, unspecified: Secondary | ICD-10-CM | POA: Diagnosis not present

## 2019-08-30 DIAGNOSIS — I129 Hypertensive chronic kidney disease with stage 1 through stage 4 chronic kidney disease, or unspecified chronic kidney disease: Secondary | ICD-10-CM | POA: Diagnosis not present

## 2019-08-30 DIAGNOSIS — K219 Gastro-esophageal reflux disease without esophagitis: Secondary | ICD-10-CM | POA: Diagnosis not present

## 2019-08-30 DIAGNOSIS — G20 Parkinson's disease: Secondary | ICD-10-CM | POA: Diagnosis not present

## 2019-08-30 DIAGNOSIS — E1165 Type 2 diabetes mellitus with hyperglycemia: Secondary | ICD-10-CM | POA: Diagnosis not present

## 2019-08-30 DIAGNOSIS — N183 Chronic kidney disease, stage 3 unspecified: Secondary | ICD-10-CM | POA: Diagnosis not present

## 2019-08-30 DIAGNOSIS — E1122 Type 2 diabetes mellitus with diabetic chronic kidney disease: Secondary | ICD-10-CM | POA: Diagnosis not present

## 2019-08-31 ENCOUNTER — Other Ambulatory Visit: Payer: Self-pay | Admitting: Internal Medicine

## 2019-09-01 DIAGNOSIS — R69 Illness, unspecified: Secondary | ICD-10-CM | POA: Diagnosis not present

## 2019-09-01 DIAGNOSIS — S0083XD Contusion of other part of head, subsequent encounter: Secondary | ICD-10-CM | POA: Diagnosis not present

## 2019-09-01 DIAGNOSIS — E1165 Type 2 diabetes mellitus with hyperglycemia: Secondary | ICD-10-CM | POA: Diagnosis not present

## 2019-09-01 DIAGNOSIS — I129 Hypertensive chronic kidney disease with stage 1 through stage 4 chronic kidney disease, or unspecified chronic kidney disease: Secondary | ICD-10-CM | POA: Diagnosis not present

## 2019-09-01 DIAGNOSIS — G20 Parkinson's disease: Secondary | ICD-10-CM | POA: Diagnosis not present

## 2019-09-01 DIAGNOSIS — S72002D Fracture of unspecified part of neck of left femur, subsequent encounter for closed fracture with routine healing: Secondary | ICD-10-CM | POA: Diagnosis not present

## 2019-09-01 DIAGNOSIS — E1122 Type 2 diabetes mellitus with diabetic chronic kidney disease: Secondary | ICD-10-CM | POA: Diagnosis not present

## 2019-09-01 DIAGNOSIS — J988 Other specified respiratory disorders: Secondary | ICD-10-CM | POA: Diagnosis not present

## 2019-09-01 DIAGNOSIS — K219 Gastro-esophageal reflux disease without esophagitis: Secondary | ICD-10-CM | POA: Diagnosis not present

## 2019-09-01 DIAGNOSIS — N183 Chronic kidney disease, stage 3 unspecified: Secondary | ICD-10-CM | POA: Diagnosis not present

## 2019-09-07 DIAGNOSIS — H6123 Impacted cerumen, bilateral: Secondary | ICD-10-CM | POA: Diagnosis not present

## 2019-09-07 DIAGNOSIS — H902 Conductive hearing loss, unspecified: Secondary | ICD-10-CM | POA: Diagnosis not present

## 2019-09-11 ENCOUNTER — Encounter: Payer: Self-pay | Admitting: Unknown Physician Specialty

## 2019-09-11 ENCOUNTER — Other Ambulatory Visit: Payer: Self-pay

## 2019-09-12 DIAGNOSIS — G478 Other sleep disorders: Secondary | ICD-10-CM | POA: Diagnosis not present

## 2019-09-12 DIAGNOSIS — I959 Hypotension, unspecified: Secondary | ICD-10-CM | POA: Diagnosis not present

## 2019-09-12 DIAGNOSIS — R42 Dizziness and giddiness: Secondary | ICD-10-CM | POA: Diagnosis not present

## 2019-09-12 DIAGNOSIS — G20 Parkinson's disease: Secondary | ICD-10-CM | POA: Diagnosis not present

## 2019-09-12 DIAGNOSIS — R413 Other amnesia: Secondary | ICD-10-CM | POA: Diagnosis not present

## 2019-09-12 DIAGNOSIS — Z8639 Personal history of other endocrine, nutritional and metabolic disease: Secondary | ICD-10-CM | POA: Diagnosis not present

## 2019-09-12 NOTE — Discharge Instructions (Signed)
General Anesthesia, Adult, Care After This sheet gives you information about how to care for yourself after your procedure. Your health care provider may also give you more specific instructions. If you have problems or questions, contact your health care provider. What can I expect after the procedure? After the procedure, the following side effects are common:  Pain or discomfort at the IV site.  Nausea.  Vomiting.  Sore throat.  Trouble concentrating.  Feeling cold or chills.  Weak or tired.  Sleepiness and fatigue.  Soreness and body aches. These side effects can affect parts of the body that were not involved in surgery. Follow these instructions at home:  For at least 24 hours after the procedure:  Have a responsible adult stay with you. It is important to have someone help care for you until you are awake and alert.  Rest as needed.  Do not: ? Participate in activities in which you could fall or become injured. ? Drive. ? Use heavy machinery. ? Drink alcohol. ? Take sleeping pills or medicines that cause drowsiness. ? Make important decisions or sign legal documents. ? Take care of children on your own. Eating and drinking  Follow any instructions from your health care provider about eating or drinking restrictions.  When you feel hungry, start by eating small amounts of foods that are soft and easy to digest (bland), such as toast. Gradually return to your regular diet.  Drink enough fluid to keep your urine pale yellow.  If you vomit, rehydrate by drinking water, juice, or clear broth. General instructions  If you have sleep apnea, surgery and certain medicines can increase your risk for breathing problems. Follow instructions from your health care provider about wearing your sleep device: ? Anytime you are sleeping, including during daytime naps. ? While taking prescription pain medicines, sleeping medicines, or medicines that make you drowsy.  Return to  your normal activities as told by your health care provider. Ask your health care provider what activities are safe for you.  Take over-the-counter and prescription medicines only as told by your health care provider.  If you smoke, do not smoke without supervision.  Keep all follow-up visits as told by your health care provider. This is important. Contact a health care provider if:  You have nausea or vomiting that does not get better with medicine.  You cannot eat or drink without vomiting.  You have pain that does not get better with medicine.  You are unable to pass urine.  You develop a skin rash.  You have a fever.  You have redness around your IV site that gets worse. Get help right away if:  You have difficulty breathing.  You have chest pain.  You have blood in your urine or stool, or you vomit blood. Summary  After the procedure, it is common to have a sore throat or nausea. It is also common to feel tired.  Have a responsible adult stay with you for the first 24 hours after general anesthesia. It is important to have someone help care for you until you are awake and alert.  When you feel hungry, start by eating small amounts of foods that are soft and easy to digest (bland), such as toast. Gradually return to your regular diet.  Drink enough fluid to keep your urine pale yellow.  Return to your normal activities as told by your health care provider. Ask your health care provider what activities are safe for you. This information is not   intended to replace advice given to you by your health care provider. Make sure you discuss any questions you have with your health care provider. Document Revised: 05/28/2017 Document Reviewed: 01/08/2017 Elsevier Patient Education  2020 [employer]  

## 2019-09-13 ENCOUNTER — Other Ambulatory Visit
Admission: RE | Admit: 2019-09-13 | Discharge: 2019-09-13 | Disposition: A | Discharge status: Home / Self Care | Payer: Medicare HMO | Source: Ambulatory Visit | Attending: Unknown Physician Specialty | Admitting: Unknown Physician Specialty

## 2019-09-13 DIAGNOSIS — Z01812 Encounter for preprocedural laboratory examination: Secondary | ICD-10-CM | POA: Insufficient documentation

## 2019-09-13 DIAGNOSIS — Z20822 Contact with and (suspected) exposure to covid-19: Secondary | ICD-10-CM | POA: Diagnosis not present

## 2019-09-13 LAB — SARS CORONAVIRUS 2 (TAT 6-24 HRS): SARS Coronavirus 2: NEGATIVE

## 2019-09-14 NOTE — Anesthesia Preprocedure Evaluation (Addendum)
Anesthesia Evaluation  Patient identified by MRN, date of birth, ID band Patient awake    Reviewed: NPO status   History of Anesthesia Complications Negative for: history of anesthetic complications  Airway Mallampati: II  TM Distance: >3 FB Neck ROM: full    Dental no notable dental hx.    Pulmonary asthma (mild) ,    Pulmonary exam normal        Cardiovascular Exercise Tolerance: Good hypertension, Normal cardiovascular exam     Neuro/Psych Dementia Parkinson's  Memory loss vs mild dementia;    GI/Hepatic Neg liver ROS, GERD  Controlled,  Endo/Other  diabetesHypothyroidism   Renal/GU negative Renal ROS  negative genitourinary   Musculoskeletal   Abdominal   Peds  Hematology negative hematology ROS (+)   Anesthesia Other Findings ekg: nsr;  Covid: NEG.  2/24-2/25/21: closed left hip fracture secondary to a fall at home. Pt is s/p pinning on 07/30/19 by ortho.  Pt poor historian; Hx from daughter.  pcp: by Lavera Guise, MD at 08/20/2019  ;   Reproductive/Obstetrics                            Anesthesia Physical Anesthesia Plan  ASA: III  Anesthesia Plan: General   Post-op Pain Management:    Induction:   PONV Risk Score and Plan: 3 and Propofol infusion, Ondansetron and Treatment may vary due to age or medical condition  Airway Management Planned:   Additional Equipment:   Intra-op Plan:   Post-operative Plan:   Informed Consent: I have reviewed the patients History and Physical, chart, labs and discussed the procedure including the risks, benefits and alternatives for the proposed anesthesia with the patient or authorized representative who has indicated his/her understanding and acceptance.       Plan Discussed with: CRNA  Anesthesia Plan Comments:         Anesthesia Quick Evaluation

## 2019-09-15 ENCOUNTER — Encounter
Admission: RE | Disposition: A | Discharge status: Home / Self Care | Payer: Self-pay | Source: Ambulatory Visit | Attending: Unknown Physician Specialty

## 2019-09-15 ENCOUNTER — Other Ambulatory Visit: Payer: Self-pay

## 2019-09-15 ENCOUNTER — Ambulatory Visit: Payer: Medicare HMO | Admitting: Anesthesiology

## 2019-09-15 ENCOUNTER — Ambulatory Visit
Admission: RE | Admit: 2019-09-15 | Discharge: 2019-09-15 | Disposition: A | Discharge status: Home / Self Care | Payer: Medicare HMO | Source: Ambulatory Visit | Attending: Unknown Physician Specialty | Admitting: Unknown Physician Specialty

## 2019-09-15 ENCOUNTER — Encounter: Payer: Self-pay | Admitting: Unknown Physician Specialty

## 2019-09-15 DIAGNOSIS — Z794 Long term (current) use of insulin: Secondary | ICD-10-CM | POA: Diagnosis not present

## 2019-09-15 DIAGNOSIS — Z8249 Family history of ischemic heart disease and other diseases of the circulatory system: Secondary | ICD-10-CM | POA: Insufficient documentation

## 2019-09-15 DIAGNOSIS — Z79899 Other long term (current) drug therapy: Secondary | ICD-10-CM | POA: Insufficient documentation

## 2019-09-15 DIAGNOSIS — H6123 Impacted cerumen, bilateral: Secondary | ICD-10-CM | POA: Diagnosis not present

## 2019-09-15 DIAGNOSIS — Z833 Family history of diabetes mellitus: Secondary | ICD-10-CM | POA: Diagnosis not present

## 2019-09-15 DIAGNOSIS — Z79890 Hormone replacement therapy (postmenopausal): Secondary | ICD-10-CM | POA: Insufficient documentation

## 2019-09-15 DIAGNOSIS — E119 Type 2 diabetes mellitus without complications: Secondary | ICD-10-CM | POA: Insufficient documentation

## 2019-09-15 DIAGNOSIS — G20 Parkinson's disease: Secondary | ICD-10-CM | POA: Diagnosis not present

## 2019-09-15 DIAGNOSIS — H902 Conductive hearing loss, unspecified: Secondary | ICD-10-CM | POA: Diagnosis not present

## 2019-09-15 DIAGNOSIS — K219 Gastro-esophageal reflux disease without esophagitis: Secondary | ICD-10-CM | POA: Diagnosis not present

## 2019-09-15 HISTORY — PX: CERUMEN REMOVAL: SHX6571

## 2019-09-15 LAB — GLUCOSE, CAPILLARY
Glucose-Capillary: 69 mg/dL — ABNORMAL LOW (ref 70–99)
Glucose-Capillary: 164 mg/dL — ABNORMAL HIGH (ref 70–99)

## 2019-09-15 SURGERY — REMOVAL, CERUMEN, IMPACTED
Anesthesia: General | Site: Ear | Laterality: Bilateral

## 2019-09-15 MED ORDER — LACTATED RINGERS IV SOLN
100.0000 mL/h | INTRAVENOUS | Status: DC
  Administered 2019-09-15: 100 mL/h via INTRAVENOUS

## 2019-09-15 MED ORDER — OXYCODONE HCL 5 MG PO TABS: 5.0000 mg | ORAL_TABLET | Freq: Once | ORAL | Status: DC | PRN

## 2019-09-15 MED ORDER — OXYCODONE HCL 5 MG/5ML PO SOLN: 5.0000 mg | Freq: Once | ORAL | Status: DC | PRN

## 2019-09-15 MED ORDER — DEXTROSE 50 % IV SOLN
25.0000 mL | Freq: Once | INTRAVENOUS | Status: AC
  Administered 2019-09-15: 07:00:00 25 mL via INTRAVENOUS

## 2019-09-15 MED ORDER — LABETALOL HCL 5 MG/ML IV SOLN
5.0000 mg | INTRAVENOUS | Status: DC | PRN
  Administered 2019-09-15: 5 mg via INTRAVENOUS

## 2019-09-15 MED ORDER — PROPOFOL 10 MG/ML IV BOLUS
INTRAVENOUS | Status: DC | PRN
  Administered 2019-09-15: 40 mg via INTRAVENOUS

## 2019-09-15 MED ORDER — LIDOCAINE HCL (CARDIAC) PF 100 MG/5ML IV SOSY
PREFILLED_SYRINGE | INTRAVENOUS | Status: DC | PRN
  Administered 2019-09-15: 10 mg via INTRAVENOUS

## 2019-09-15 SURGICAL SUPPLY — 6 items
CANISTER SUCT 1200ML W/VALVE (MISCELLANEOUS) ×2 IMPLANT
GLOVE BIO SURGEON STRL SZ7.5 (GLOVE) ×2 IMPLANT
STRAP BODY AND KNEE 60X3 (MISCELLANEOUS) ×2 IMPLANT
TOWEL OR 17X26 4PK STRL BLUE (TOWEL DISPOSABLE) ×2 IMPLANT
TUBING CONN 6MMX3.1M (TUBING) ×1
TUBING SUCTION CONN 0.25 STRL (TUBING) ×1 IMPLANT

## 2019-09-15 NOTE — Op Note (Signed)
09/15/2019  7:44 AM    Leonides Grills  [id number]   Pre-Op Dx: cerumen impaction  Post-op Dx: SAME  Proc: Exam under anesthesia removal of cerumen using curette and alligator forceps  Surg:  Roena Malady  Anes:  GOT  EBL: None  Comp: None  Findings: Bilateral cerumen impaction  Procedure: Ms. Comins was identified in the holding area take the operating placed in supine position. After general mask anesthesia the operating microscope is brought in the field beginning on the right-hand side the ear canal was examined. There was a large cerumen impaction identified. The wax curette and the alligator forceps were used to remove the cerumen in its entirety. The tympanic membrane appeared intact. In similar fashion left ear was examined again there was a large wax impaction which was removed using the curette and alligator forcep. With the wax removed the patient was returned to anesthesia where she was awakened in the operating room taken care room in stable condition.  Dispo:   Good  Plan: Discharged home follow-up in 1 week for hearing test  Roena Malady  09/15/2019 7:44 AM

## 2019-09-15 NOTE — Anesthesia Procedure Notes (Signed)
Procedure Name: General with mask airway Performed by: Izetta Dakin, CRNA Pre-anesthesia Checklist: Patient identified, Emergency Drugs available, Suction available, Patient being monitored and Timeout performed Patient Re-evaluated:Patient Re-evaluated prior to induction Oxygen Delivery Method: Circle system utilized Preoxygenation: Pre-oxygenation with 100% oxygen Induction Type: IV induction Ventilation: Mask ventilation without difficulty

## 2019-09-15 NOTE — Anesthesia Postprocedure Evaluation (Signed)
Anesthesia Post Note  Patient: Jacqueline [name]  Procedure(s) Performed: CERUMEN REMOVAL (Bilateral Ear)     Patient location during evaluation: PACU Anesthesia Type: General Level of consciousness: awake and alert Pain management: pain level controlled Vital Signs Assessment: post-procedure vital signs reviewed and stable Respiratory status: spontaneous breathing, nonlabored ventilation, respiratory function stable and patient connected to nasal cannula oxygen Cardiovascular status: blood pressure returned to baseline and stable Postop Assessment: no apparent nausea or vomiting Anesthetic complications: no Comments: Pt to f/u with PCP regarding BP control.    Fidel Levy

## 2019-09-15 NOTE — Transfer of Care (Signed)
Immediate Anesthesia Transfer of Care Note  Patient: Jacqueline [name]  Procedure(s) Performed: CERUMEN REMOVAL (Bilateral Ear)  Patient Location: PACU  Anesthesia Type: General  Level of Consciousness: awake, alert  and patient cooperative  Airway and Oxygen Therapy: Patient Spontanous Breathing and Patient connected to supplemental oxygen  Post-op Assessment: Post-op Vital signs reviewed, Patient's Cardiovascular Status Stable, Respiratory Function Stable, Patent Airway and No signs of Nausea or vomiting  Post-op Vital Signs: Reviewed and stable  Complications: No apparent anesthesia complications

## 2019-09-15 NOTE — H&P (Signed)
The patient's history has been reviewed, patient examined, no change in status, stable for surgery.  Questions were answered to the patients satisfaction.  

## 2019-09-18 ENCOUNTER — Encounter: Payer: Self-pay | Admitting: *Deleted

## 2019-09-19 DIAGNOSIS — R69 Illness, unspecified: Secondary | ICD-10-CM | POA: Diagnosis not present

## 2019-09-22 DIAGNOSIS — G20 Parkinson's disease: Secondary | ICD-10-CM | POA: Diagnosis not present

## 2019-09-22 DIAGNOSIS — H903 Sensorineural hearing loss, bilateral: Secondary | ICD-10-CM | POA: Diagnosis not present

## 2019-10-15 DIAGNOSIS — R69 Illness, unspecified: Secondary | ICD-10-CM | POA: Diagnosis not present

## 2019-10-17 DIAGNOSIS — R69 Illness, unspecified: Secondary | ICD-10-CM | POA: Diagnosis not present

## 2019-10-23 DIAGNOSIS — L851 Acquired keratosis [keratoderma] palmaris et plantaris: Secondary | ICD-10-CM | POA: Diagnosis not present

## 2019-10-23 DIAGNOSIS — E1142 Type 2 diabetes mellitus with diabetic polyneuropathy: Secondary | ICD-10-CM | POA: Diagnosis not present

## 2019-10-23 DIAGNOSIS — B351 Tinea unguium: Secondary | ICD-10-CM | POA: Diagnosis not present

## 2019-10-26 DIAGNOSIS — Z794 Long term (current) use of insulin: Secondary | ICD-10-CM | POA: Diagnosis not present

## 2019-10-26 DIAGNOSIS — E1159 Type 2 diabetes mellitus with other circulatory complications: Secondary | ICD-10-CM | POA: Diagnosis not present

## 2019-10-26 DIAGNOSIS — E039 Hypothyroidism, unspecified: Secondary | ICD-10-CM | POA: Diagnosis not present

## 2019-10-26 DIAGNOSIS — E1129 Type 2 diabetes mellitus with other diabetic kidney complication: Secondary | ICD-10-CM | POA: Diagnosis not present

## 2019-10-26 DIAGNOSIS — E042 Nontoxic multinodular goiter: Secondary | ICD-10-CM | POA: Diagnosis not present

## 2019-10-26 DIAGNOSIS — I152 Hypertension secondary to endocrine disorders: Secondary | ICD-10-CM | POA: Diagnosis not present

## 2019-10-26 DIAGNOSIS — R809 Proteinuria, unspecified: Secondary | ICD-10-CM | POA: Diagnosis not present

## 2019-10-31 DIAGNOSIS — Z8639 Personal history of other endocrine, nutritional and metabolic disease: Secondary | ICD-10-CM | POA: Diagnosis not present

## 2019-10-31 DIAGNOSIS — I959 Hypotension, unspecified: Secondary | ICD-10-CM | POA: Diagnosis not present

## 2019-10-31 DIAGNOSIS — G478 Other sleep disorders: Secondary | ICD-10-CM | POA: Diagnosis not present

## 2019-10-31 DIAGNOSIS — R413 Other amnesia: Secondary | ICD-10-CM | POA: Diagnosis not present

## 2019-10-31 DIAGNOSIS — G20 Parkinson's disease: Secondary | ICD-10-CM | POA: Diagnosis not present

## 2019-10-31 DIAGNOSIS — R42 Dizziness and giddiness: Secondary | ICD-10-CM | POA: Diagnosis not present

## 2019-11-01 DIAGNOSIS — E785 Hyperlipidemia, unspecified: Secondary | ICD-10-CM | POA: Diagnosis not present

## 2019-11-01 DIAGNOSIS — I152 Hypertension secondary to endocrine disorders: Secondary | ICD-10-CM | POA: Diagnosis not present

## 2019-11-01 DIAGNOSIS — G20 Parkinson's disease: Secondary | ICD-10-CM | POA: Diagnosis not present

## 2019-11-01 DIAGNOSIS — E782 Mixed hyperlipidemia: Secondary | ICD-10-CM | POA: Diagnosis not present

## 2019-11-01 DIAGNOSIS — M1990 Unspecified osteoarthritis, unspecified site: Secondary | ICD-10-CM | POA: Diagnosis not present

## 2019-11-01 DIAGNOSIS — E1159 Type 2 diabetes mellitus with other circulatory complications: Secondary | ICD-10-CM | POA: Diagnosis not present

## 2019-11-01 DIAGNOSIS — Z794 Long term (current) use of insulin: Secondary | ICD-10-CM | POA: Diagnosis not present

## 2019-11-01 DIAGNOSIS — E1129 Type 2 diabetes mellitus with other diabetic kidney complication: Secondary | ICD-10-CM | POA: Diagnosis not present

## 2019-11-01 DIAGNOSIS — E042 Nontoxic multinodular goiter: Secondary | ICD-10-CM | POA: Diagnosis not present

## 2019-11-01 DIAGNOSIS — E119 Type 2 diabetes mellitus without complications: Secondary | ICD-10-CM | POA: Diagnosis not present

## 2019-11-01 DIAGNOSIS — G309 Alzheimer's disease, unspecified: Secondary | ICD-10-CM | POA: Diagnosis not present

## 2019-11-01 DIAGNOSIS — R32 Unspecified urinary incontinence: Secondary | ICD-10-CM | POA: Diagnosis not present

## 2019-11-01 DIAGNOSIS — J45909 Unspecified asthma, uncomplicated: Secondary | ICD-10-CM | POA: Diagnosis not present

## 2019-11-01 DIAGNOSIS — R809 Proteinuria, unspecified: Secondary | ICD-10-CM | POA: Diagnosis not present

## 2019-11-01 DIAGNOSIS — M810 Age-related osteoporosis without current pathological fracture: Secondary | ICD-10-CM | POA: Diagnosis not present

## 2019-11-01 DIAGNOSIS — E039 Hypothyroidism, unspecified: Secondary | ICD-10-CM | POA: Diagnosis not present

## 2019-11-01 DIAGNOSIS — K219 Gastro-esophageal reflux disease without esophagitis: Secondary | ICD-10-CM | POA: Diagnosis not present

## 2019-11-17 DIAGNOSIS — R69 Illness, unspecified: Secondary | ICD-10-CM | POA: Diagnosis not present

## 2019-12-12 DIAGNOSIS — G20 Parkinson's disease: Secondary | ICD-10-CM | POA: Diagnosis not present

## 2019-12-12 DIAGNOSIS — R413 Other amnesia: Secondary | ICD-10-CM | POA: Diagnosis not present

## 2019-12-12 DIAGNOSIS — R42 Dizziness and giddiness: Secondary | ICD-10-CM | POA: Diagnosis not present

## 2019-12-12 DIAGNOSIS — G478 Other sleep disorders: Secondary | ICD-10-CM | POA: Diagnosis not present

## 2019-12-12 DIAGNOSIS — M810 Age-related osteoporosis without current pathological fracture: Secondary | ICD-10-CM | POA: Diagnosis not present

## 2019-12-12 DIAGNOSIS — I959 Hypotension, unspecified: Secondary | ICD-10-CM | POA: Diagnosis not present

## 2019-12-12 DIAGNOSIS — Z8639 Personal history of other endocrine, nutritional and metabolic disease: Secondary | ICD-10-CM | POA: Diagnosis not present

## 2019-12-15 DIAGNOSIS — R69 Illness, unspecified: Secondary | ICD-10-CM | POA: Diagnosis not present

## 2019-12-19 ENCOUNTER — Ambulatory Visit: Payer: Medicare HMO | Admitting: Internal Medicine

## 2019-12-20 ENCOUNTER — Ambulatory Visit: Payer: Medicare HMO | Admitting: Adult Health

## 2020-01-08 ENCOUNTER — Ambulatory Visit (INDEPENDENT_AMBULATORY_CARE_PROVIDER_SITE_OTHER): Payer: Medicare HMO | Admitting: Internal Medicine

## 2020-01-08 ENCOUNTER — Other Ambulatory Visit: Payer: Self-pay

## 2020-01-08 ENCOUNTER — Encounter: Payer: Self-pay | Admitting: Internal Medicine

## 2020-01-08 DIAGNOSIS — M810 Age-related osteoporosis without current pathological fracture: Secondary | ICD-10-CM

## 2020-01-08 DIAGNOSIS — G20A1 Parkinson's disease without dyskinesia, without mention of fluctuations: Secondary | ICD-10-CM

## 2020-01-08 DIAGNOSIS — G20 Parkinson's disease: Secondary | ICD-10-CM

## 2020-01-08 DIAGNOSIS — K117 Disturbances of salivary secretion: Secondary | ICD-10-CM | POA: Diagnosis not present

## 2020-01-08 DIAGNOSIS — R1032 Left lower quadrant pain: Secondary | ICD-10-CM

## 2020-01-08 DIAGNOSIS — S72002D Fracture of unspecified part of neck of left femur, subsequent encounter for closed fracture with routine healing: Secondary | ICD-10-CM

## 2020-01-08 NOTE — Progress Notes (Signed)
[facility] [place], [place] [address]  Internal MEDICINE  Office Visit Note  Patient Name: Jacqueline [name]  [id number]  [id number]  Date of Service: 01/10/2020  Chief Complaint  Patient presents with  . Follow-up    4 month parkinson's disease  . excessive drooling    gotten worse.  bad on 01/06/20 feels like she is going to choke with it  . Leg Pain    left groin area feels like a sharp pain and is concerned of blood clot   HPI  Pt is here with her daughter with few complaints 1. Left hip and groin pain, radiates in her leg, did have a hip fx status post left hip cannulated hip pinning for subcapital femur fracture by Dr. Roland Rack on 2/21/202, she has been having problem with PT, some insurance issues. She is unable to take oral biphosphanate, will like to see rheumatology for IV reclast  2. C/O excessive drooling due to PD. She is feeling depressed about it since it is more prominent in a restaurant.  3. Thyroid nodules followed bu endo and ENT 4. Parkinsonism followed by Neurology    Current Medication: Outpatient Encounter Medications as of 01/08/2020  Medication Sig Note  . Calcium-Vitamin D-Vitamin K 295-621-30 MG-UNT-MCG CHEW Chew by mouth.   . carbidopa-levodopa (SINEMET IR) 25-100 MG tablet Take by mouth See admin instructions. Take 1 tablet by mouth at 10 am, take 1/2 tablet by mouth at 1 pm, take 1/2 tablet at 4 pm, take 1/2 tablet at 7 pm. 07/29/2019: Takes with sinemet cr Daughter states 1,1/2,1/2/,1/2 directions at pharmacy is 1,1/2,1/2,1/2,1/2  . Carbidopa-Levodopa ER (SINEMET CR) 25-100 MG tablet controlled release Take 1 tablet by mouth at bedtime.   . cholecalciferol (VITAMIN D3) 25 MCG (1000 UNIT) tablet Take 500 Units by mouth daily.   Marland Kitchen donepezil (ARICEPT) 10 MG tablet Take 10 mg by mouth at bedtime.   . fluticasone (FLONASE) 50 MCG/ACT nasal spray Place 1 spray into the nose daily.    . insulin aspart (NOVOLOG FLEXPEN) 100 UNIT/ML  FlexPen Inject into the skin as needed for high blood sugar.   . Insulin Glargine (BASAGLAR KWIKPEN) 100 UNIT/ML SOPN Inject 16 Units into the skin daily.    Marland Kitchen levothyroxine (SYNTHROID, LEVOTHROID) 50 MCG tablet Take 25 mcg by mouth daily before breakfast.    . melatonin 5 MG TABS Take 5 mg by mouth at bedtime.   . Multiple Vitamin (MULTIVITAMIN WITH MINERALS) TABS tablet Take 1 tablet by mouth daily.   Marland Kitchen omeprazole (PRILOSEC) 40 MG capsule TAKE 1 CAPSULE BY MOUTH EVERY DAY   . rasagiline (AZILECT) 1 MG TABS tablet Take 1 mg by mouth daily.   . SYMBICORT 80-4.5 MCG/ACT inhaler TAKE 2 PUFFS BY MOUTH TWICE A DAY   . HYDROcodone-acetaminophen (NORCO/VICODIN) 5-325 MG tablet Take 1 tablet by mouth every 6 (six) hours as needed for severe pain. (Patient not taking: Reported on 01/08/2020)   . memantine (NAMENDA) 10 MG tablet Take 10 mg by mouth 2 (two) times daily.     No facility-administered encounter medications on file as of 01/08/2020.    Surgical History: Past Surgical History:  Procedure Laterality Date  . ABDOMINAL HYSTERECTOMY    . CERUMEN REMOVAL Bilateral 09/15/2019   Procedure: CERUMEN REMOVAL;  Surgeon: Beverly Gust, MD;  Location: [place];  Service: ENT;  Laterality: Bilateral;  Diabetic - insulin  . CHOLECYSTECTOMY    . COLONOSCOPY WITH PROPOFOL N/A 03/29/2015   Procedure:  COLONOSCOPY WITH PROPOFOL;  Surgeon: Hulen Luster, MD;  Location: [facility] ENDOSCOPY;  Service: Gastroenterology;  Laterality: N/A;  . ESOPHAGOGASTRODUODENOSCOPY (EGD) WITH PROPOFOL N/A 03/29/2015   Procedure: ESOPHAGOGASTRODUODENOSCOPY (EGD) WITH PROPOFOL;  Surgeon: Hulen Luster, MD;  Location: [facility] ENDOSCOPY;  Service: Gastroenterology;  Laterality: N/A;  . HIP PINNING,CANNULATED Left 07/30/2019   Procedure: CANNULATED HIP PINNING;  Surgeon: Corky Mull, MD;  Location: [facility] ORS;  Service: Orthopedics;  Laterality: Left;  . lung collapse     broken rib from fall punctured lung    Medical History: Past  Medical History:  Diagnosis Date  . Asthma   . Collapsed lung   . Dementia ([place])   . Diabetes mellitus without complication ([place])    Patient takes Insulin twice a day.   Marland Kitchen GERD (gastroesophageal reflux disease)   . Hyperlipidemia   . Hypertension   . Hypothyroidism   . Parkinson's disease ([place])   . Reactive airway disease     Family History: Family History  Problem Relation Age of Onset  . Bladder Cancer Neg Hx   . Kidney cancer Neg Hx     Social History   Socioeconomic History  . Marital status: Widowed    Spouse name: Not on file  . Number of children: Not on file  . Years of education: Not on file  . Highest education level: Not on file  Occupational History  . Not on file  Tobacco Use  . Smoking status: Never Smoker  . Smokeless tobacco: Never Used  Vaping Use  . Vaping Use: Never used  Substance and Sexual Activity  . Alcohol use: Not Currently  . Drug use: No  . Sexual activity: Not on file  Other Topics Concern  . Not on file  Social History Narrative  . Not on file   Social Determinants of Health   Financial Resource Strain:   . Difficulty of Paying Living Expenses:   Food Insecurity:   . Worried About Charity fundraiser in the Last Year:   . Arboriculturist in the Last Year:   Transportation Needs:   . Film/video editor (Medical):   Marland Kitchen Lack of Transportation (Non-Medical):   Physical Activity:   . Days of Exercise per Week:   . Minutes of Exercise per Session:   Stress:   . Feeling of Stress :   Social Connections:   . Frequency of Communication with Friends and Family:   . Frequency of Social Gatherings with Friends and Family:   . Attends Religious Services:   . Active Member of Clubs or Organizations:   . Attends Archivist Meetings:   Marland Kitchen Marital Status:   Intimate Partner Violence:   . Fear of Current or Ex-Partner:   . Emotionally Abused:   Marland Kitchen Physically Abused:   . Sexually Abused:     Review of Systems   Constitutional: Negative for chills, diaphoresis and fatigue.  HENT: Positive for trouble swallowing. Negative for ear pain, postnasal drip and sinus pressure.        Excessive drooling// problem with control of saliva due to PD  Eyes: Negative for photophobia, discharge, redness, itching and visual disturbance.  Respiratory: Negative for cough, shortness of breath and wheezing.   Cardiovascular: Negative for chest pain, palpitations and leg swelling.  Gastrointestinal: Negative for abdominal pain, constipation, diarrhea, nausea and vomiting.  Genitourinary: Positive for flank pain. Negative for dysuria.  Musculoskeletal: Positive for arthralgias. Negative for back pain, gait problem and  neck pain.       Hip and leg pain   Skin: Negative for color change.  Allergic/Immunologic: Negative for environmental allergies and food allergies.  Neurological: Negative for dizziness and headaches.  Hematological: Does not bruise/bleed easily.  Psychiatric/Behavioral: Negative for agitation, behavioral problems (depression) and hallucinations.   Vital Signs: BP (!) 145/55   Pulse 91   Temp (!) 96.9 F (36.1 C)   Resp 16   Ht 5\' 2"  (1.575 m)   Wt 102 lb 6.4 oz (46.4 kg)   SpO2 95%   BMI 18.73 kg/m   Physical Exam Constitutional:      General: She is not in acute distress.    Appearance: She is well-developed. She is not diaphoretic.  HENT:     Head: Normocephalic and atraumatic.     Mouth/Throat:     Pharynx: No oropharyngeal exudate.  Eyes:     Pupils: Pupils are equal, round, and reactive to light.  Neck:     Thyroid: No thyromegaly.     Vascular: No JVD.     Trachea: No tracheal deviation.  Cardiovascular:     Rate and Rhythm: Normal rate and regular rhythm.     Heart sounds: Normal heart sounds. No murmur heard.  No friction rub. No gallop.   Pulmonary:     Effort: Pulmonary effort is normal. No respiratory distress.     Breath sounds: No wheezing or rales.  Chest:     Chest  wall: No tenderness.  Abdominal:     General: Bowel sounds are normal.     Palpations: Abdomen is soft.  Musculoskeletal:        General: Normal range of motion.     Cervical back: Normal range of motion and neck supple.  Lymphadenopathy:     Cervical: No cervical adenopathy.  Skin:    General: Skin is warm and dry.  Neurological:     Mental Status: She is alert and oriented to person, place, and time.     Cranial Nerves: No cranial nerve deficit.  Psychiatric:        Behavior: Behavior normal.        Thought Content: Thought content normal.        Judgment: Judgment normal.    Assessment/Plan: 1. Senile osteoporosis - Refer out per pt request, cannot take oral meds - cholecalciferol (VITAMIN D3) 25 MCG (1000 UNIT) tablet; Take 500 Units by mouth daily. - Calcium-Vitamin D-Vitamin K [id number] MG-UNT-MCG CHEW; Chew by mouth. - Ambulatory referral to Rheumatology//or ENDO  2. Left inguinal pain - POCT ABI Screening Pilot No Charge - Normal ABI  3. Parkinson's disease ([place]) - Worsening motor control with weakness, tremors, will order PT/OT and speech to help with this  - Ambulatory referral to [place]  4. Closed fracture of left hip with routine healing, subsequent encounter - Worsening pain, will need to have home health  - Ambulatory referral to [place]  5. Drooling - It is hard to control problem, anticholinergic medications will have more side effects due to PD, will try inhaled Breztri due to Glycopyrrolate in it, she will stop Symbicort. She will need better control of her muscles. Will try Speech therapy    General Counseling: Roan verbalizes understanding of the findings of todays visit and agrees with plan of treatment. I have discussed any further diagnostic evaluation that may be needed or ordered today. We also reviewed her medications today. she has been encouraged to call the office with any  questions or concerns that should arise related to todays  visit. Orders Placed This Encounter  Procedures  . Ambulatory referral to Rheumatology  . Ambulatory referral to Home Health  . POCT ABI Screening Pilot No Charge    Total time spent: 40 Minutes Time spent includes review of chart, medications, test results, and follow up plan with the patient.   Dr Lavera Guise Internal medicine

## 2020-01-09 ENCOUNTER — Telehealth: Payer: Self-pay

## 2020-01-09 NOTE — Telephone Encounter (Signed)
Spoke with brittany from [employer] for home health and PT she will take her referral

## 2020-01-10 ENCOUNTER — Other Ambulatory Visit: Payer: Self-pay

## 2020-01-10 ENCOUNTER — Telehealth: Payer: Self-pay

## 2020-01-10 NOTE — Telephone Encounter (Signed)
Pt daughter advised that stopped symbicort  And replace breztri  Inhale 2 puff twice a day and sample is ready for pickup

## 2020-01-11 NOTE — Telephone Encounter (Signed)
Pt daughter advised that ok to get shot by endocrinology

## 2020-01-12 ENCOUNTER — Other Ambulatory Visit: Payer: Self-pay | Admitting: Internal Medicine

## 2020-01-12 ENCOUNTER — Other Ambulatory Visit: Payer: Self-pay

## 2020-01-12 DIAGNOSIS — G20 Parkinson's disease: Secondary | ICD-10-CM

## 2020-01-12 DIAGNOSIS — Z23 Encounter for immunization: Secondary | ICD-10-CM

## 2020-01-12 DIAGNOSIS — R300 Dysuria: Secondary | ICD-10-CM

## 2020-01-12 DIAGNOSIS — Z5320 Procedure and treatment not carried out because of patient's decision for unspecified reasons: Secondary | ICD-10-CM

## 2020-01-12 DIAGNOSIS — E782 Mixed hyperlipidemia: Secondary | ICD-10-CM

## 2020-01-12 DIAGNOSIS — Z794 Long term (current) use of insulin: Secondary | ICD-10-CM

## 2020-01-12 DIAGNOSIS — G20A1 Parkinson's disease without dyskinesia, without mention of fluctuations: Secondary | ICD-10-CM

## 2020-01-12 DIAGNOSIS — L89152 Pressure ulcer of sacral region, stage 2: Secondary | ICD-10-CM

## 2020-01-12 DIAGNOSIS — Z0001 Encounter for general adult medical examination with abnormal findings: Secondary | ICD-10-CM

## 2020-01-12 DIAGNOSIS — E119 Type 2 diabetes mellitus without complications: Secondary | ICD-10-CM

## 2020-01-12 MED ORDER — OMEPRAZOLE 40 MG PO CPDR: 40.0000 mg | DELAYED_RELEASE_CAPSULE | Freq: Every day | ORAL | 1 refills | Status: DC

## 2020-01-15 ENCOUNTER — Telehealth: Payer: Self-pay

## 2020-01-15 ENCOUNTER — Ambulatory Visit: Payer: Self-pay

## 2020-01-15 NOTE — Telephone Encounter (Signed)
Pt daughter called that pt is exposed to covid as per dfk advised pt daughter need to that if she don't have symptoms she can wait for testing

## 2020-01-17 DIAGNOSIS — R69 Illness, unspecified: Secondary | ICD-10-CM | POA: Diagnosis not present

## 2020-01-20 ENCOUNTER — Other Ambulatory Visit: Payer: Self-pay

## 2020-01-20 ENCOUNTER — Emergency Department
Admission: EM | Admit: 2020-01-20 | Discharge: 2020-01-21 | Disposition: A | Discharge status: Home / Self Care | Payer: Medicare HMO | Attending: Emergency Medicine | Admitting: Emergency Medicine

## 2020-01-20 ENCOUNTER — Encounter: Payer: Self-pay | Admitting: Emergency Medicine

## 2020-01-20 DIAGNOSIS — N183 Chronic kidney disease, stage 3 unspecified: Secondary | ICD-10-CM | POA: Diagnosis not present

## 2020-01-20 DIAGNOSIS — E1122 Type 2 diabetes mellitus with diabetic chronic kidney disease: Secondary | ICD-10-CM | POA: Diagnosis not present

## 2020-01-20 DIAGNOSIS — G20 Parkinson's disease: Secondary | ICD-10-CM | POA: Diagnosis not present

## 2020-01-20 DIAGNOSIS — E039 Hypothyroidism, unspecified: Secondary | ICD-10-CM | POA: Insufficient documentation

## 2020-01-20 DIAGNOSIS — I129 Hypertensive chronic kidney disease with stage 1 through stage 4 chronic kidney disease, or unspecified chronic kidney disease: Secondary | ICD-10-CM | POA: Insufficient documentation

## 2020-01-20 DIAGNOSIS — U071 COVID-19: Secondary | ICD-10-CM | POA: Diagnosis not present

## 2020-01-20 DIAGNOSIS — F0280 Dementia in other diseases classified elsewhere without behavioral disturbance: Secondary | ICD-10-CM | POA: Diagnosis not present

## 2020-01-20 DIAGNOSIS — Z794 Long term (current) use of insulin: Secondary | ICD-10-CM | POA: Insufficient documentation

## 2020-01-20 DIAGNOSIS — Z7951 Long term (current) use of inhaled steroids: Secondary | ICD-10-CM | POA: Diagnosis not present

## 2020-01-20 DIAGNOSIS — J45909 Unspecified asthma, uncomplicated: Secondary | ICD-10-CM | POA: Diagnosis not present

## 2020-01-20 DIAGNOSIS — R531 Weakness: Secondary | ICD-10-CM | POA: Diagnosis present

## 2020-01-20 DIAGNOSIS — G309 Alzheimer's disease, unspecified: Secondary | ICD-10-CM | POA: Diagnosis not present

## 2020-01-20 DIAGNOSIS — E1165 Type 2 diabetes mellitus with hyperglycemia: Secondary | ICD-10-CM | POA: Diagnosis not present

## 2020-01-20 DIAGNOSIS — E869 Volume depletion, unspecified: Secondary | ICD-10-CM

## 2020-01-20 DIAGNOSIS — R69 Illness, unspecified: Secondary | ICD-10-CM | POA: Diagnosis not present

## 2020-01-20 DIAGNOSIS — Z79890 Hormone replacement therapy (postmenopausal): Secondary | ICD-10-CM | POA: Insufficient documentation

## 2020-01-20 LAB — BASIC METABOLIC PANEL
Anion gap: 12 (ref 5–15)
BUN: 28 mg/dL — ABNORMAL HIGH (ref 8–23)
CO2: 27 mmol/L (ref 22–32)
Calcium: 9 mg/dL (ref 8.9–10.3)
Chloride: 93 mmol/L — ABNORMAL LOW (ref 98–111)
Creatinine, Ser: 1.17 mg/dL — ABNORMAL HIGH (ref 0.44–1.00)
GFR calc Af Amer: 50 mL/min — ABNORMAL LOW (ref >60)
GFR calc non Af Amer: 43 mL/min — ABNORMAL LOW (ref >60)
Glucose, Bld: 441 mg/dL — ABNORMAL HIGH (ref 70–99)
Potassium: 4 mmol/L (ref 3.5–5.1)
Sodium: 132 mmol/L — ABNORMAL LOW (ref 135–145)

## 2020-01-20 LAB — CBC
HCT: 39.9 % (ref 36.0–46.0)
Hemoglobin: 13.6 g/dL (ref 12.0–15.0)
MCH: 30.5 pg (ref 26.0–34.0)
MCHC: 34.1 g/dL (ref 30.0–36.0)
MCV: 89.5 fL (ref 80.0–100.0)
Platelets: 148 10*3/uL — ABNORMAL LOW (ref 150–400)
RBC: 4.46 MIL/uL (ref 3.87–5.11)
RDW: 12.6 % (ref 11.5–15.5)
WBC: 9.6 10*3/uL (ref 4.0–10.5)
nRBC: 0 % (ref 0.0–0.2)

## 2020-01-20 LAB — GLUCOSE, CAPILLARY: Glucose-Capillary: 342 mg/dL — ABNORMAL HIGH (ref 70–99)

## 2020-01-20 MED ORDER — SODIUM CHLORIDE 0.9 % IV BOLUS
1000.0000 mL | Freq: Once | INTRAVENOUS | Status: AC
  Administered 2020-01-20: 1000 mL via INTRAVENOUS

## 2020-01-20 NOTE — ED Triage Notes (Signed)
Pt arrived via POV with daughter, pt was exposed to covid 1 week ago from this past Wednesday. Pt had been asymptomatic, daughter states for the last couple of days pt has had decreased appetite and not walking as much.  Pt has not been tested for COVID. Pt is vaccinated.  Pt has home health and someone came out today and advised to come to hospital  CBG at home was in the 300s, pt takes insulin. Last dose was this morning.

## 2020-01-20 NOTE — ED Provider Notes (Signed)
[facility] Emergency Department Provider Note  ____________________________________________   First MD Initiated Contact with Patient 01/20/20 2306     (approximate)  I have reviewed the triage vital signs and the nursing notes.   HISTORY  Chief Complaint Weakness  Level 5 caveat:  history/ROS limited by chronic dementia.  History is mostly provided by her adult daughter who is at bedside.  HPI Jacqueline [name] is a 84 y.o. female with medical history as listed below who presents tonight for evaluation of several days of general malaise, generalized weakness, decreased appetite, and decreased oral intake as well as hyperglycemia.  She has been fully vaccinated for COVID-19 as has her family, but she has multiple home health care workers who although were also vaccinated have recently tested positive for COVID-19.  The patient has no specific complaints other than as described above.  She has no acute pain, just some chronic pain from a prior left hip fracture.  She denies fever, sore throat, chest pain, cough, shortness of breath, nausea, vomiting, abdominal pain, and dysuria.  She admits to not eating or drinking as well as usual.  She has had no focal numbness nor weakness, just an overall generalized weakness and is requiring more care from her family and home health providers than usual.  Symptoms are moderate in severity and her daughter brought her to the emergency department earlier today after her home health care nurse recommended that she bring her in to be checked out given the elevated blood sugar in spite of insulin use and the overall constellation of symptoms.  Nothing in particular makes her symptoms better or worse.         Past Medical History:  Diagnosis Date  . Asthma   . Collapsed lung   . Dementia ([place])   . Diabetes mellitus without complication ([place])    Patient takes Insulin twice a day.   Marland Kitchen GERD (gastroesophageal reflux disease)   .  Hyperlipidemia   . Hypertension   . Hypothyroidism   . Parkinson's disease ([place])   . Reactive airway disease     Patient Active Problem List   Diagnosis Date Noted  . Goals of care, counseling/discussion   . Palliative care by specialist   . Pressure injury of skin 07/30/2019  . Closed left hip fracture ([place]) 07/29/2019  . Fall at home, initial encounter 07/29/2019  . Acquired hypothyroidism 07/29/2019  . Dementia due to Parkinson's disease without behavioral disturbance ([place]) 07/29/2019  . Reactive airway disease that is not asthma 07/29/2019  . Frequent falls 07/29/2019  . Pressure injury of skin of sacral region 06/12/2019  . Wound healing, delayed 06/12/2019  . Encounter for general adult medical examination with abnormal findings 06/21/2018  . Uncontrolled type 2 diabetes mellitus with hyperglycemia ([place]) 06/21/2018  . Asthma without status asthmaticus 07/08/2016  . Hyperlipidemia, unspecified 07/08/2016  . Dysphagia 01/07/2016  . Unsteadiness 10/01/2015  . Type 2 diabetes mellitus without complication, without long-term current use of insulin ([place]) 08/07/2015  . Mixed Alzheimer's and vascular dementia ([place]) 08/06/2015  . Controlled type 2 diabetes mellitus without complication, without long-term current use of insulin ([place]) 02/12/2015  . Type 2 diabetes mellitus with stage 3 chronic kidney disease ([place]) 06/19/2014  . Essential hypertension 03/08/2014  . Parkinson's disease ([place]) 01/16/2014  . Fatigue 11/09/2013  . Diabetes mellitus, type II ([place]) 09/15/2013  . Difficulty walking 09/15/2013  . Dizziness 09/15/2013    Past Surgical History:  Procedure Laterality Date  .  ABDOMINAL HYSTERECTOMY    . CERUMEN REMOVAL Bilateral 09/15/2019   Procedure: CERUMEN REMOVAL;  Surgeon: Beverly Gust, MD;  Location: [place];  Service: ENT;  Laterality: Bilateral;  Diabetic - insulin  . CHOLECYSTECTOMY    . COLONOSCOPY WITH PROPOFOL N/A 03/29/2015   Procedure:  COLONOSCOPY WITH PROPOFOL;  Surgeon: Hulen Luster, MD;  Location: [facility] ENDOSCOPY;  Service: Gastroenterology;  Laterality: N/A;  . ESOPHAGOGASTRODUODENOSCOPY (EGD) WITH PROPOFOL N/A 03/29/2015   Procedure: ESOPHAGOGASTRODUODENOSCOPY (EGD) WITH PROPOFOL;  Surgeon: Hulen Luster, MD;  Location: [facility] ENDOSCOPY;  Service: Gastroenterology;  Laterality: N/A;  . HIP PINNING,CANNULATED Left 07/30/2019   Procedure: CANNULATED HIP PINNING;  Surgeon: Corky Mull, MD;  Location: [facility] ORS;  Service: Orthopedics;  Laterality: Left;  . lung collapse     broken rib from fall punctured lung    Prior to Admission medications   Medication Sig Start Date End Date Taking? Authorizing Provider  Calcium-Vitamin D-Vitamin K [id number] MG-UNT-MCG CHEW Chew by mouth.    [provider]  carbidopa-levodopa (SINEMET IR) 25-100 MG tablet Take by mouth See admin instructions. Take 1 tablet by mouth at 10 am, take 1/2 tablet by mouth at 1 pm, take 1/2 tablet at 4 pm, take 1/2 tablet at 7 pm. 07/07/16   [provider]  Carbidopa-Levodopa ER (SINEMET CR) 25-100 MG tablet controlled release Take 1 tablet by mouth at bedtime. 04/09/19   [provider]  cholecalciferol (VITAMIN D3) 25 MCG (1000 UNIT) tablet Take 500 Units by mouth daily.    [provider]  donepezil (ARICEPT) 10 MG tablet Take 10 mg by mouth at bedtime.    [provider]  fluticasone (FLONASE) 50 MCG/ACT nasal spray Place 1 spray into the nose daily.     [provider]  HYDROcodone-acetaminophen (NORCO/VICODIN) 5-325 MG tablet Take 1 tablet by mouth every 6 (six) hours as needed for severe pain. Patient not taking: Reported on 01/08/2020 08/01/19   Lattie Corns, PA-C  insulin aspart (NOVOLOG FLEXPEN) 100 UNIT/ML FlexPen Inject into the skin as needed for high blood sugar.    [provider]  Insulin Glargine (BASAGLAR KWIKPEN) 100 UNIT/ML SOPN Inject 16 Units into the skin daily.     [provider]  levothyroxine (SYNTHROID, LEVOTHROID) 50 MCG tablet Take 25 mcg by mouth daily before breakfast.     [provider]  melatonin 5 MG TABS Take 5 mg by mouth at bedtime.    [provider]  memantine (NAMENDA) 10 MG tablet Take 10 mg by mouth 2 (two) times daily.  04/13/17 09/11/19  [provider]  Multiple Vitamin (MULTIVITAMIN WITH MINERALS) TABS tablet Take 1 tablet by mouth daily.    [provider]  omeprazole (PRILOSEC) 40 MG capsule Take 1 capsule (40 mg total) by mouth daily. 01/12/20   Lavera Guise, MD  rasagiline (AZILECT) 1 MG TABS tablet Take 1 mg by mouth daily.    [provider]  SYMBICORT 80-4.5 MCG/ACT inhaler TAKE 2 PUFFS BY MOUTH TWICE A DAY 08/31/19   Lavera Guise, MD    Allergies Patient has no known allergies.  Family History  Problem Relation Age of Onset  . Bladder Cancer Neg Hx   . Kidney cancer Neg Hx     Social History Social History   Tobacco Use  . Smoking status: Never Smoker  . Smokeless tobacco: Never Used  Vaping Use  . Vaping Use: Never used  Substance Use Topics  .  Alcohol use: Not Currently  . Drug use: No    Review of Systems Constitutional: Generalized malaise and fatigue.  No fever/chills Eyes: No visual changes. ENT: No sore throat. Cardiovascular: Denies chest pain. Respiratory: Denies shortness of breath. Gastrointestinal: Decreased appetite and oral intake.  No abdominal pain.  No nausea, no vomiting.  No diarrhea.  No constipation. Genitourinary: Negative for dysuria. Musculoskeletal: Negative for neck pain.  Negative for back pain. Integumentary: Negative for rash. Neurological: Negative for headaches, focal weakness or numbness.   ____________________________________________   PHYSICAL EXAM:  VITAL SIGNS: ED Triage Vitals [01/20/20 1335]  Enc Vitals Group     BP 132/78     Pulse Rate 94     Resp 20     Temp 98.6 F (37 C)     Temp Source Oral     SpO2 95 %       Weight 47.6 kg (105 lb)     Height 1.575 m (5\' 2" )     Head Circumference      Peak Flow      Pain Score      Pain Loc      Pain Edu?      Excl. in [place]?     Constitutional: Alert and to self, location, and her daughter.  Elderly and appears tired but in no acute distress. Eyes: Conjunctivae are normal.  Head: Atraumatic. Nose: No congestion/rhinnorhea. Mouth/Throat: Patient is wearing a mask. Neck: No stridor.  No meningeal signs.   Cardiovascular: Normal rate, regular rhythm. Good peripheral circulation. Grossly normal heart sounds. Respiratory: Normal respiratory effort.  No retractions. Gastrointestinal: Soft and nontender. No distention.  Musculoskeletal: No lower extremity tenderness nor edema. No gross deformities of extremities. Neurologic:  Normal speech and language. No gross focal neurologic deficits are appreciated, including having equal strength bilaterally in upper and lower extremities.  I appreciate no cogwheel rigidity at this time upon passive range of motion of her arms in spite of her history of parkinsonism. Skin:  Skin is warm, dry and intact. Psychiatric: Mood and affect are normal. Speech and behavior are normal.  ____________________________________________   LABS (all labs ordered are listed, but only abnormal results are displayed)  Labs Reviewed  SARS CORONAVIRUS 2 BY RT PCR (HOSPITAL ORDER, [facility]) - Abnormal; Notable for the following components:      Result Value   SARS Coronavirus 2 POSITIVE (*)    All other components within normal limits  BASIC METABOLIC PANEL - Abnormal; Notable for the following components:   Sodium 132 (*)    Chloride 93 (*)    Glucose, Bld 441 (*)    BUN 28 (*)    Creatinine, Ser 1.17 (*)    GFR calc non Af Amer 43 (*)    GFR calc Af Amer 50 (*)    All other components within normal limits  CBC - Abnormal; Notable for the following components:   Platelets 148 (*)    All other  components within normal limits  URINALYSIS, COMPLETE (UACMP) WITH MICROSCOPIC - Abnormal; Notable for the following components:   Color, Urine YELLOW (*)    APPearance CLOUDY (*)    Glucose, UA >=500 (*)    Hgb urine dipstick SMALL (*)    Ketones, ur 5 (*)    Protein, ur 30 (*)    Bacteria, UA RARE (*)    All other components within normal limits  GLUCOSE, CAPILLARY - Abnormal; Notable for the following components:  Glucose-Capillary 342 (*)    All other components within normal limits   ____________________________________________  EKG  ED ECG REPORT I, Hinda Kehr, the attending physician, personally viewed and interpreted this ECG.  Date: 01/20/2020 EKG Time: 13: 44 Rate: 91 Rhythm: normal sinus rhythm QRS Axis: normal Intervals: normal ST/T Wave abnormalities: normal Narrative Interpretation: no evidence of acute ischemia ____________________________________________  RADIOLOGY I, Hinda Kehr, personally viewed and evaluated these images (plain radiographs) as part of my medical decision making, as well as reviewing the written report by the radiologist.  ED MD interpretation: No indication for emergent imaging  Official radiology report(s): No results found.  ____________________________________________   PROCEDURES   Procedure(s) performed (including Critical Care):  Procedures   ____________________________________________   INITIAL IMPRESSION / MDM / ASSESSMENT AND PLAN / ED COURSE  As part of my medical decision making, I reviewed the following data within the [place] History obtained from family, Nursing notes reviewed and incorporated, Labs reviewed , EKG interpreted , Old chart reviewed and Notes from prior ED visits   Differential diagnosis includes, but is not limited to, dehydration/volume depletion, electrolyte or metabolic abnormality, nonspecific hyperglycemia, DKA, HHS, urinary tract infection, respiratory infection  of either viral or bacterial source, less likely CVA.    Fortunately the patient has minimal symptoms, no respiratory complaints, and no acute pain.  Vital signs are stable other than now having some hypertension which is likely due to her not getting her meds (she has been waiting about 10 hours and the emergency department waiting room due to overwhelming emergency department and hospital patient volume).  She had some mild tachycardia upon arrival but that has resolved.  She has a reassuring physical exam with no tenderness to palpation of her abdomen and no focal neurological deficits, no respiratory concerns or complaints, and generally stable vitals as previously described.  Lab work is notable for some mild hyponatremia and mild elevation of BUN and creatinine compared to baseline.  Although this does not meet criteria for acute renal failure and does not require admission, I believe she would benefit from some IV fluids.  I discussed this with her and her daughter and they agreed for the plan for IV placement, 1 L normal saline IV bolus, and out catheterization to rule out acute urinary tract infection, and they would also like to perform a Covid PCR test to rule out acute infection.  Although she has no respiratory symptoms at this time, given her recent close contacts with Covid positive patients and the fact that she is fully vaccinated she could have an atypical presentation and I think it is reasonable to check.  I anticipate discharge home with outpatient follow-up and appropriate treatment for UTI as indicated by her urinalysis.       Clinical Course as of Jan 20 138  Sun Jan 21, 2020  0050 Reassuring urinalysis, a few ketones but no evidence of acute infection.  Mostly notable for glucosuria which is not unexpected.  Urinalysis, Complete w Microscopic Urine, Catheterized(!) [CF]  0102 The patient's Covid PCR test was positive.  I updated the patient's daughter by phone.  We had already  talked about this possibility and they understand that the patient does not need admission at this time.  I provided a great deal of printed information and references and resources about COVID-19 and management at home and gave my usual and customary management and return precautions.  The daughter understands and agrees with the plan.  The  patient is hemodynamically stable and appropriate for discharge.   [CF]    Clinical Course User Index [CF] Hinda Kehr, MD     ____________________________________________  FINAL CLINICAL IMPRESSION(S) / ED DIAGNOSES  Final diagnoses:  Generalized weakness  Volume depletion  COVID-19     MEDICATIONS GIVEN DURING THIS VISIT:  Medications  sodium chloride 0.9 % bolus 1,000 mL (1,000 mLs Intravenous New Bag/Given 01/20/20 2340)     ED Discharge Orders    None      *Please note:  Jacqueline [name] was evaluated in Emergency Department on 01/21/2020 for the symptoms described in the history of present illness. She was evaluated in the context of the global COVID-19 pandemic, which necessitated consideration that the patient might be at risk for infection with the SARS-CoV-2 virus that causes COVID-19. Institutional protocols and algorithms that pertain to the evaluation of patients at risk for COVID-19 are in a state of rapid change based on information released by regulatory bodies including the CDC and federal and state organizations. These policies and algorithms were followed during the patient's care in the ED.  Some ED evaluations and interventions may be delayed as a result of limited staffing during and after the pandemic.*  Note:  This document was prepared using Dragon voice recognition software and may include unintentional dictation errors.   Hinda Kehr, MD 01/21/20 [phone]

## 2020-01-20 NOTE — Discharge Instructions (Addendum)
As we discussed, although you have tested positive for COVID-19 (coronavirus), you do not need to be hospitalized at this time.  Read through all the included information including the recommendations from the CDC.  We recommend that you self-quarantine at home with your immediate family only (people with whom you have already been in contact) for 10-14 days after your fever has gone away (without taking medication to make your temperature come down, such as Tylenol (acetaminophen)), after your respiratory symptoms have improved, and after at least 14 days have passed since your symptoms first appeared.  You should have as minimal contact as possible with anyone else including close family as per the CDC paperwork guidelines listed below. Follow-up with your doctor by phone or online as needed and return immediately to the emergency department or call 911 only if you develop new or worsening symptoms that concern you.  If you were prescribed any medications, please use them as instructed.  You can find up-to-date information about COVID-19 in [place] by calling the [place] Coronavirus Helpline: 1-866-462-3821. You may also call 2-1-1, or [phone], or additional resources.  You can also find information online at https://www.ncdhhs.gov/divisions/public-health/coronavirus-disease-2019-covid-19-response-[place], or on the Center for Disease Control (CDC) website at https://www.cdc.gov/coronavirus/2019-ncov/index.html.  

## 2020-01-20 NOTE — ED Notes (Signed)
Pt here with daughter who states pt's caregiver recently tested positive for COVID. Per caregiver pt with decreased po intake, not able to ambulate and appears weak. Daughter denies fever. Pt with history of parkinson's disease.

## 2020-01-20 NOTE — ED Notes (Signed)
Pt awakens easily upon this RN arrival. Repeat VS obtained by this RN at this time. VSS at this time. This RN apologized and explained delay to patient and daughter who is currently with patient at this time.

## 2020-01-21 LAB — URINALYSIS, COMPLETE (UACMP) WITH MICROSCOPIC
Bilirubin Urine: NEGATIVE
Glucose, UA: >=500 mg/dL — AB
Ketones, ur: 5 mg/dL — AB
Leukocytes,Ua: NEGATIVE
Nitrite: NEGATIVE
Protein, ur: 30 mg/dL — AB
Specific Gravity, Urine: 1.023 (ref 1.005–1.030)
pH: 5 (ref 5.0–8.0)

## 2020-01-21 LAB — SARS CORONAVIRUS 2 BY RT PCR (HOSPITAL ORDER, PERFORMED IN ~~LOC~~ HOSPITAL LAB): SARS Coronavirus 2: POSITIVE — AB

## 2020-01-21 NOTE — ED Notes (Signed)
Pt lying in bed, voices no complaints, awaiting covid results.

## 2020-01-21 NOTE — ED Notes (Signed)
Positive covid called from labd. Dr. Karma Greaser notified.

## 2020-01-22 ENCOUNTER — Telehealth: Payer: Self-pay | Admitting: Nurse Practitioner

## 2020-01-22 NOTE — Telephone Encounter (Signed)
Called to Discuss with patient about Covid symptoms and the use of bamlanivimab, a monoclonal antibody infusion for those with mild to moderate Covid symptoms and at a high risk of hospitalization.     Pt has dementia - unsure of when symptoms started - not a candidate for MAB - advised caregiver to take her to the ED if symptoms worsen

## 2020-01-25 ENCOUNTER — Telehealth: Payer: Self-pay

## 2020-01-25 ENCOUNTER — Other Ambulatory Visit: Payer: Self-pay | Admitting: Nurse Practitioner

## 2020-01-25 DIAGNOSIS — U071 COVID-19: Secondary | ICD-10-CM

## 2020-01-25 NOTE — Progress Notes (Signed)
I connected by phone with [name] on 01/25/2020 at 3:23 PM to discuss the potential use of a new treatment for mild to moderate COVID-19 viral infection in non-hospitalized patients.  This patient is a 84 y.o. female that meets the FDA criteria for Emergency Use Authorization of COVID monoclonal antibody casirivimab/imdevimab.  Has a (+) direct SARS-CoV-2 viral test result  Has mild or moderate COVID-19   Is NOT hospitalized due to COVID-19  Is within 10 days of symptom onset  Has at least one of the high risk factor(s) for progression to severe COVID-19 and/or hospitalization as defined in EUA.  Specific high risk criteria : Older age (>/= 82 yo)   I have spoken and communicated the following to the patient or parent/caregiver regarding COVID monoclonal antibody treatment:  1. FDA has authorized the emergency use for the treatment of mild to moderate COVID-19 in adults and pediatric patients with positive results of direct SARS-CoV-2 viral testing who are 63 years of age and older weighing at least 40 kg, and who are at high risk for progressing to severe COVID-19 and/or hospitalization.  2. The significant known and potential risks and benefits of COVID monoclonal antibody, and the extent to which such potential risks and benefits are unknown.  3. Information on available alternative treatments and the risks and benefits of those alternatives, including clinical trials.  4. Patients treated with COVID monoclonal antibody should continue to self-isolate and use infection control measures (e.g., wear mask, isolate, social distance, avoid sharing personal items, clean and disinfect "high touch" surfaces, and frequent handwashing) according to CDC guidelines.   5. The patient or parent/caregiver has the option to accept or refuse COVID monoclonal antibody treatment.  After reviewing this information with the patient, The patient agreed to proceed with receiving casirivimab\imdevimab  infusion and will be provided a copy of the Fact sheet prior to receiving the infusion. Fenton Foy 01/25/2020 3:23 PM

## 2020-01-25 NOTE — Telephone Encounter (Signed)
Pt's daughter Garey Ham called informing that pt tested positive for covid at ER.  Last week pt's Bs were high around 300.  Pt has been weak and having high Bs and low Bp.  Yesterday 01/24/20 pt lost all energy and went down. Also pt has been sleeping a lot.  Last night Bp was 86/57.  Today Bp = 102/78 p= 101, temp: 97.3, Bs 112.daughter advised she got a call Monday from [place] advising that pt should have an antibody infusion and daughter was asking if [facility] would advise for her to do so.  I spoke to [facility] and she advised that yes with pt testing positive and he symptoms that she should have the infusion.. I called daughter back and told her [facility] advised that she should do the infusion.  dbs

## 2020-01-26 ENCOUNTER — Ambulatory Visit (HOSPITAL_COMMUNITY)
Admission: RE | Admit: 2020-01-26 | Discharge: 2020-01-26 | Disposition: A | Discharge status: Home / Self Care | Payer: Medicare Other | Source: Ambulatory Visit | Attending: Pulmonary Disease | Admitting: Pulmonary Disease

## 2020-01-26 DIAGNOSIS — Z23 Encounter for immunization: Secondary | ICD-10-CM | POA: Insufficient documentation

## 2020-01-26 DIAGNOSIS — U071 COVID-19: Secondary | ICD-10-CM

## 2020-01-26 MED ORDER — ALBUTEROL SULFATE HFA 108 (90 BASE) MCG/ACT IN AERS: 2.0000 | INHALATION_SPRAY | Freq: Once | RESPIRATORY_TRACT | Status: DC | PRN

## 2020-01-26 MED ORDER — SODIUM CHLORIDE 0.9 % IV SOLN: INTRAVENOUS | Status: DC | PRN

## 2020-01-26 MED ORDER — FAMOTIDINE IN NACL 20-0.9 MG/50ML-% IV SOLN: 20.0000 mg | Freq: Once | INTRAVENOUS | Status: DC | PRN

## 2020-01-26 MED ORDER — METHYLPREDNISOLONE SODIUM SUCC 125 MG IJ SOLR: 125.0000 mg | Freq: Once | INTRAMUSCULAR | Status: DC | PRN

## 2020-01-26 MED ORDER — EPINEPHRINE 0.3 MG/0.3ML IJ SOAJ: 0.3000 mg | Freq: Once | INTRAMUSCULAR | Status: DC | PRN

## 2020-01-26 MED ORDER — SODIUM CHLORIDE 0.9 % IV SOLN
1200.0000 mg | Freq: Once | INTRAVENOUS | Status: AC
  Administered 2020-01-26: 1200 mg via INTRAVENOUS
  Filled 2020-01-26: qty 10

## 2020-01-26 MED ORDER — DIPHENHYDRAMINE HCL 50 MG/ML IJ SOLN: 50.0000 mg | Freq: Once | INTRAMUSCULAR | Status: DC | PRN

## 2020-01-26 NOTE — Discharge Instructions (Signed)

## 2020-01-26 NOTE — Progress Notes (Signed)
Scanned and placed original copies up front for pt fmal.disability paperwork. beth

## 2020-01-26 NOTE — Progress Notes (Signed)
  Diagnosis: COVID-19  Physician:  Procedure: Covid Infusion Clinic Med: casirivimab\imdevimab infusion - Provided patient with casirivimab\imdevimab fact sheet for patients, parents and caregivers prior to infusion.  Complications: No immediate complications noted.  Discharge: Discharged home   Scotty Court 01/26/2020

## 2020-01-28 ENCOUNTER — Other Ambulatory Visit: Payer: Self-pay | Admitting: Internal Medicine

## 2020-01-28 DIAGNOSIS — L89152 Pressure ulcer of sacral region, stage 2: Secondary | ICD-10-CM

## 2020-01-28 DIAGNOSIS — E782 Mixed hyperlipidemia: Secondary | ICD-10-CM

## 2020-01-28 DIAGNOSIS — E119 Type 2 diabetes mellitus without complications: Secondary | ICD-10-CM

## 2020-01-28 DIAGNOSIS — Z794 Long term (current) use of insulin: Secondary | ICD-10-CM

## 2020-01-28 DIAGNOSIS — Z0001 Encounter for general adult medical examination with abnormal findings: Secondary | ICD-10-CM

## 2020-01-28 DIAGNOSIS — Z23 Encounter for immunization: Secondary | ICD-10-CM

## 2020-01-28 DIAGNOSIS — R300 Dysuria: Secondary | ICD-10-CM

## 2020-01-28 DIAGNOSIS — G20A1 Parkinson's disease without dyskinesia, without mention of fluctuations: Secondary | ICD-10-CM

## 2020-01-28 DIAGNOSIS — Z5320 Procedure and treatment not carried out because of patient's decision for unspecified reasons: Secondary | ICD-10-CM

## 2020-01-28 DIAGNOSIS — G20 Parkinson's disease: Secondary | ICD-10-CM

## 2020-01-29 ENCOUNTER — Emergency Department: Payer: Medicare HMO

## 2020-01-29 ENCOUNTER — Emergency Department
Admission: EM | Admit: 2020-01-29 | Discharge: 2020-01-29 | Disposition: A | Discharge status: Home / Self Care | Payer: Medicare HMO | Attending: Emergency Medicine | Admitting: Emergency Medicine

## 2020-01-29 ENCOUNTER — Other Ambulatory Visit: Payer: Self-pay

## 2020-01-29 DIAGNOSIS — Y999 Unspecified external cause status: Secondary | ICD-10-CM | POA: Diagnosis not present

## 2020-01-29 DIAGNOSIS — E039 Hypothyroidism, unspecified: Secondary | ICD-10-CM | POA: Insufficient documentation

## 2020-01-29 DIAGNOSIS — M25552 Pain in left hip: Secondary | ICD-10-CM | POA: Diagnosis not present

## 2020-01-29 DIAGNOSIS — J45909 Unspecified asthma, uncomplicated: Secondary | ICD-10-CM | POA: Insufficient documentation

## 2020-01-29 DIAGNOSIS — S199XXA Unspecified injury of neck, initial encounter: Secondary | ICD-10-CM | POA: Diagnosis not present

## 2020-01-29 DIAGNOSIS — Y92009 Unspecified place in unspecified non-institutional (private) residence as the place of occurrence of the external cause: Secondary | ICD-10-CM | POA: Insufficient documentation

## 2020-01-29 DIAGNOSIS — N183 Chronic kidney disease, stage 3 unspecified: Secondary | ICD-10-CM | POA: Insufficient documentation

## 2020-01-29 DIAGNOSIS — W19XXXA Unspecified fall, initial encounter: Secondary | ICD-10-CM | POA: Diagnosis not present

## 2020-01-29 DIAGNOSIS — Z794 Long term (current) use of insulin: Secondary | ICD-10-CM | POA: Diagnosis not present

## 2020-01-29 DIAGNOSIS — Y939 Activity, unspecified: Secondary | ICD-10-CM | POA: Diagnosis not present

## 2020-01-29 DIAGNOSIS — Z79899 Other long term (current) drug therapy: Secondary | ICD-10-CM | POA: Diagnosis not present

## 2020-01-29 DIAGNOSIS — E1122 Type 2 diabetes mellitus with diabetic chronic kidney disease: Secondary | ICD-10-CM | POA: Diagnosis not present

## 2020-01-29 DIAGNOSIS — M25512 Pain in left shoulder: Secondary | ICD-10-CM | POA: Diagnosis not present

## 2020-01-29 DIAGNOSIS — G319 Degenerative disease of nervous system, unspecified: Secondary | ICD-10-CM | POA: Diagnosis not present

## 2020-01-29 DIAGNOSIS — R519 Headache, unspecified: Secondary | ICD-10-CM | POA: Diagnosis not present

## 2020-01-29 DIAGNOSIS — S01112A Laceration without foreign body of left eyelid and periocular area, initial encounter: Secondary | ICD-10-CM | POA: Diagnosis not present

## 2020-01-29 DIAGNOSIS — M47812 Spondylosis without myelopathy or radiculopathy, cervical region: Secondary | ICD-10-CM | POA: Diagnosis not present

## 2020-01-29 DIAGNOSIS — I10 Essential (primary) hypertension: Secondary | ICD-10-CM | POA: Diagnosis not present

## 2020-01-29 DIAGNOSIS — G3183 Dementia with Lewy bodies: Secondary | ICD-10-CM | POA: Insufficient documentation

## 2020-01-29 DIAGNOSIS — S0181XA Laceration without foreign body of other part of head, initial encounter: Secondary | ICD-10-CM | POA: Insufficient documentation

## 2020-01-29 DIAGNOSIS — I7090 Unspecified atherosclerosis: Secondary | ICD-10-CM | POA: Diagnosis not present

## 2020-01-29 DIAGNOSIS — R52 Pain, unspecified: Secondary | ICD-10-CM | POA: Diagnosis not present

## 2020-01-29 DIAGNOSIS — I129 Hypertensive chronic kidney disease with stage 1 through stage 4 chronic kidney disease, or unspecified chronic kidney disease: Secondary | ICD-10-CM | POA: Diagnosis not present

## 2020-01-29 DIAGNOSIS — M25519 Pain in unspecified shoulder: Secondary | ICD-10-CM | POA: Diagnosis not present

## 2020-01-29 DIAGNOSIS — S60512A Abrasion of left hand, initial encounter: Secondary | ICD-10-CM | POA: Diagnosis not present

## 2020-01-29 DIAGNOSIS — I6523 Occlusion and stenosis of bilateral carotid arteries: Secondary | ICD-10-CM | POA: Diagnosis not present

## 2020-01-29 DIAGNOSIS — S0512XA Contusion of eyeball and orbital tissues, left eye, initial encounter: Secondary | ICD-10-CM | POA: Diagnosis not present

## 2020-01-29 DIAGNOSIS — G9389 Other specified disorders of brain: Secondary | ICD-10-CM | POA: Diagnosis not present

## 2020-01-29 LAB — CBC WITH DIFFERENTIAL/PLATELET
Abs Immature Granulocytes: 0.04 10*3/uL (ref 0.00–0.07)
Basophils Absolute: 0 10*3/uL (ref 0.0–0.1)
Basophils Relative: 1 %
Eosinophils Absolute: 0.1 10*3/uL (ref 0.0–0.5)
Eosinophils Relative: 2 %
HCT: 42.3 % (ref 36.0–46.0)
Hemoglobin: 14.2 g/dL (ref 12.0–15.0)
Immature Granulocytes: 1 %
Lymphocytes Relative: 25 %
Lymphs Abs: 1.4 10*3/uL (ref 0.7–4.0)
MCH: 30 pg (ref 26.0–34.0)
MCHC: 33.6 g/dL (ref 30.0–36.0)
MCV: 89.4 fL (ref 80.0–100.0)
Monocytes Absolute: 0.6 10*3/uL (ref 0.1–1.0)
Monocytes Relative: 10 %
Neutro Abs: 3.4 10*3/uL (ref 1.7–7.7)
Neutrophils Relative %: 61 %
Platelets: 320 10*3/uL (ref 150–400)
RBC: 4.73 MIL/uL (ref 3.87–5.11)
RDW: 12.5 % (ref 11.5–15.5)
WBC: 5.5 10*3/uL (ref 4.0–10.5)
nRBC: 0 % (ref 0.0–0.2)

## 2020-01-29 LAB — COMPREHENSIVE METABOLIC PANEL
ALT: 7 U/L (ref 0–44)
AST: 22 U/L (ref 15–41)
Albumin: 3.9 g/dL (ref 3.5–5.0)
Alkaline Phosphatase: 109 U/L (ref 38–126)
Anion gap: 14 (ref 5–15)
BUN: 28 mg/dL — ABNORMAL HIGH (ref 8–23)
CO2: 29 mmol/L (ref 22–32)
Calcium: 10 mg/dL (ref 8.9–10.3)
Chloride: 97 mmol/L — ABNORMAL LOW (ref 98–111)
Creatinine, Ser: 0.95 mg/dL (ref 0.44–1.00)
GFR calc Af Amer: >60 mL/min (ref >60)
GFR calc non Af Amer: 56 mL/min — ABNORMAL LOW (ref >60)
Glucose, Bld: 195 mg/dL — ABNORMAL HIGH (ref 70–99)
Potassium: 4 mmol/L (ref 3.5–5.1)
Sodium: 140 mmol/L (ref 135–145)
Total Bilirubin: 1.1 mg/dL (ref 0.3–1.2)
Total Protein: 7.2 g/dL (ref 6.5–8.1)

## 2020-01-29 LAB — URINALYSIS, COMPLETE (UACMP) WITH MICROSCOPIC
Bilirubin Urine: NEGATIVE
Glucose, UA: NEGATIVE mg/dL
Hgb urine dipstick: NEGATIVE
Ketones, ur: 5 mg/dL — AB
Leukocytes,Ua: NEGATIVE
Nitrite: NEGATIVE
Protein, ur: 30 mg/dL — AB
Specific Gravity, Urine: 1.017 (ref 1.005–1.030)
pH: 5 (ref 5.0–8.0)

## 2020-01-29 LAB — TROPONIN I (HIGH SENSITIVITY): Troponin I (High Sensitivity): 11 ng/L (ref <18)

## 2020-01-29 MED ORDER — LIDOCAINE HCL (PF) 1 % IJ SOLN
5.0000 mL | Freq: Once | INTRAMUSCULAR | Status: AC
  Administered 2020-01-29: 5 mL
  Filled 2020-01-29: qty 5

## 2020-01-29 MED ORDER — LIDOCAINE 5 % EX PTCH: 1.0000 | MEDICATED_PATCH | Freq: Two times a day (BID) | CUTANEOUS | 0 refills | Status: DC

## 2020-01-29 NOTE — Discharge Instructions (Addendum)
You have been seen in the emergency department after a fall and have suffered a facial laceration.  The laceration has been repaired with absorbable sutures.  The sutures do not need to be removed.  Please attempt to keep the area dry for the next 24 hours.  Please follow-up with your doctor for recheck/reevaluation as needed.

## 2020-01-29 NOTE — ED Provider Notes (Signed)
[facility] Emergency Department Provider Note  Time seen: 1:04 PM  I have reviewed the triage vital signs and the nursing notes.   HISTORY  Chief Complaint Fall   HPI Jacqueline [name] is a 84 y.o. female with a past medical history of dementia, diabetes, Parkinson's, hypertension, hyperlipidemia presents to the emergency department after a fall.   According to the daughter patient fell at home approximately 5 AM.  Patient has a history of Parkinson's as well as dementia.  Patient has home health but does not have any home physical therapy.  Patient does not recall exactly how she fell but the daughter states this has been an ongoing issue with increasing falls at home as her Parkinson's has progressed.  Gait the patient is awake alert, no acute distress.  Patient has a 3 cm laceration to her left eyebrow.  Hemostatic.  Small abrasion to the dorsal aspect of her left hand.  Patient states slight headache largely negative review of systems otherwise.  Past Medical History:  Diagnosis Date  . Asthma   . Collapsed lung   . Dementia ([place])   . Diabetes mellitus without complication ([place])    Patient takes Insulin twice a day.   Marland Kitchen GERD (gastroesophageal reflux disease)   . Hyperlipidemia   . Hypertension   . Hypothyroidism   . Parkinson's disease ([place])   . Reactive airway disease     Patient Active Problem List   Diagnosis Date Noted  . Goals of care, counseling/discussion   . Palliative care by specialist   . Pressure injury of skin 07/30/2019  . Closed left hip fracture ([place]) 07/29/2019  . Fall at home, initial encounter 07/29/2019  . Acquired hypothyroidism 07/29/2019  . Dementia due to Parkinson's disease without behavioral disturbance ([place]) 07/29/2019  . Reactive airway disease that is not asthma 07/29/2019  . Frequent falls 07/29/2019  . Pressure injury of skin of sacral region 06/12/2019  . Wound healing, delayed 06/12/2019  . Encounter for general  adult medical examination with abnormal findings 06/21/2018  . Uncontrolled type 2 diabetes mellitus with hyperglycemia ([place]) 06/21/2018  . Asthma without status asthmaticus 07/08/2016  . Hyperlipidemia, unspecified 07/08/2016  . Dysphagia 01/07/2016  . Unsteadiness 10/01/2015  . Type 2 diabetes mellitus without complication, without long-term current use of insulin ([place]) 08/07/2015  . Mixed Alzheimer's and vascular dementia ([place]) 08/06/2015  . Controlled type 2 diabetes mellitus without complication, without long-term current use of insulin ([place]) 02/12/2015  . Type 2 diabetes mellitus with stage 3 chronic kidney disease ([place]) 06/19/2014  . Essential hypertension 03/08/2014  . Parkinson's disease ([place]) 01/16/2014  . Fatigue 11/09/2013  . Diabetes mellitus, type II ([place]) 09/15/2013  . Difficulty walking 09/15/2013  . Dizziness 09/15/2013    Past Surgical History:  Procedure Laterality Date  . ABDOMINAL HYSTERECTOMY    . CERUMEN REMOVAL Bilateral 09/15/2019   Procedure: CERUMEN REMOVAL;  Surgeon: Beverly Gust, MD;  Location: [place];  Service: ENT;  Laterality: Bilateral;  Diabetic - insulin  . CHOLECYSTECTOMY    . COLONOSCOPY WITH PROPOFOL N/A 03/29/2015   Procedure: COLONOSCOPY WITH PROPOFOL;  Surgeon: Hulen Luster, MD;  Location: [facility] ENDOSCOPY;  Service: Gastroenterology;  Laterality: N/A;  . ESOPHAGOGASTRODUODENOSCOPY (EGD) WITH PROPOFOL N/A 03/29/2015   Procedure: ESOPHAGOGASTRODUODENOSCOPY (EGD) WITH PROPOFOL;  Surgeon: Hulen Luster, MD;  Location: [facility] ENDOSCOPY;  Service: Gastroenterology;  Laterality: N/A;  . HIP PINNING,CANNULATED Left 07/30/2019   Procedure: CANNULATED HIP PINNING;  Surgeon: Corky Mull,  MD;  Location: [facility] ORS;  Service: Orthopedics;  Laterality: Left;  . lung collapse     broken rib from fall punctured lung    Prior to Admission medications   Medication Sig Start Date End Date Taking? Authorizing Provider  Calcium-Vitamin D-Vitamin K  [phone] MG-UNT-MCG CHEW Chew by mouth.    [provider]  carbidopa-levodopa (SINEMET IR) 25-100 MG tablet Take by mouth See admin instructions. Take 1 tablet by mouth at 10 am, take 1/2 tablet by mouth at 1 pm, take 1/2 tablet at 4 pm, take 1/2 tablet at 7 pm. 07/07/16   [provider]  Carbidopa-Levodopa ER (SINEMET CR) 25-100 MG tablet controlled release Take 1 tablet by mouth at bedtime. 04/09/19   [provider]  cholecalciferol (VITAMIN D3) 25 MCG (1000 UNIT) tablet Take 500 Units by mouth daily.    [provider]  donepezil (ARICEPT) 10 MG tablet Take 10 mg by mouth at bedtime.    [provider]  fluticasone (FLONASE) 50 MCG/ACT nasal spray Place 1 spray into the nose daily.     [provider]  HYDROcodone-acetaminophen (NORCO/VICODIN) 5-325 MG tablet Take 1 tablet by mouth every 6 (six) hours as needed for severe pain. Patient not taking: Reported on 01/08/2020 08/01/19   Lattie Corns, PA-C  insulin aspart (NOVOLOG FLEXPEN) 100 UNIT/ML FlexPen Inject into the skin as needed for high blood sugar.    [provider]  Insulin Glargine (BASAGLAR KWIKPEN) 100 UNIT/ML SOPN Inject 16 Units into the skin daily.     [provider]  levothyroxine (SYNTHROID, LEVOTHROID) 50 MCG tablet Take 25 mcg by mouth daily before breakfast.     [provider]  melatonin 5 MG TABS Take 5 mg by mouth at bedtime.    [provider]  memantine (NAMENDA) 10 MG tablet Take 10 mg by mouth 2 (two) times daily.  04/13/17 09/11/19  [provider]  Multiple Vitamin (MULTIVITAMIN WITH MINERALS) TABS tablet Take 1 tablet by mouth daily.    [provider]  omeprazole (PRILOSEC) 40 MG capsule Take 1 capsule (40 mg total) by mouth daily. 01/12/20   Lavera Guise, MD  rasagiline (AZILECT) 1 MG TABS tablet Take 1 mg by mouth daily.    [provider]  SYMBICORT 80-4.5 MCG/ACT inhaler TAKE 2 PUFFS BY MOUTH  TWICE A DAY 08/31/19   Lavera Guise, MD    No Known Allergies  Family History  Problem Relation Age of Onset  . Bladder Cancer Neg Hx   . Kidney cancer Neg Hx     Social History Social History   Tobacco Use  . Smoking status: Never Smoker  . Smokeless tobacco: Never Used  Vaping Use  . Vaping Use: Never used  Substance Use Topics  . Alcohol use: Not Currently  . Drug use: No    Review of Systems Constitutional: Negative for fever. Eyes: Negative for visual complaints Cardiovascular: Negative for chest pain. Respiratory: Negative for shortness of breath. Gastrointestinal: Negative for abdominal pain Musculoskeletal: Negative for musculoskeletal complaints Skin: 3 cm laceration above left eyebrow.  Abrasion/small skin tear to dorsal aspect of left hand. Neurological: Negative for headache All other ROS negative  ____________________________________________   PHYSICAL EXAM:  VITAL SIGNS: ED Triage Vitals  Enc Vitals Group     BP 01/29/20 0637 (!) 159/80     Pulse Rate 01/29/20 0637 62     Resp 01/29/20 0637 15     Temp 01/29/20 9528  97.9 F (36.6 C)     Temp src --      SpO2 01/29/20 0637 95 %     Weight 01/29/20 0631 104 lb 15 oz (47.6 kg)     Height 01/29/20 0631 5\' 2"  (1.575 m)     Head Circumference --      Peak Flow --      Pain Score --      Pain Loc --      Pain Edu? --      Excl. in [place]? --    Constitutional: Alert and oriented. Well appearing and in no distress. Eyes: Normal exam ENT      Head: Small hematoma to left forehead with a 3 cm laceration to the left eyebrow, currently hemostatic.      Mouth/Throat: Mucous membranes are moist. Cardiovascular: Normal rate, regular rhythm.  Respiratory: Normal respiratory effort without tachypnea nor retractions. Breath sounds are clear  Gastrointestinal: Soft and nontender. No distention. Musculoskeletal: Nontender with normal range of motion in all extremities.  Able to make great fist bilaterally with  no pain elicited in her hands. Neurologic:  Normal speech and language. No gross focal neurologic deficits Skin: Laceration to left eyebrow.  Small skin tear and abrasion to the dorsal aspect of her left hand. Psychiatric: Mood and affect are normal.   ____________________________________________    EKG  EKG viewed and interpreted by myself shows a sinus rhythm 81 bpm with a narrow QRS, normal axis, normal intervals, nonspecific ST changes.  ____________________________________________    RADIOLOGY  CT head shows no acute intracranial abnormality.  ____________________________________________   INITIAL IMPRESSION / ASSESSMENT AND PLAN / ED COURSE  Pertinent labs & imaging results that were available during my care of the patient were reviewed by me and considered in my medical decision making (see chart for details).   Patient presents emergency department after a fall at home.  Patient does have a 3 cm laceration small hematoma just above her left eyebrow currently hemostatic.  Small abrasion to dorsal aspect of left hand, hemostatic.  They have cleaned the abrasion and the laceration.  Laceration repaired with 3 5-0 rapid Vicryl sutures covered with Dermabond.  Given reassuring work-up and CT scan I believe the patient is safe for discharge home.  Daughter agreeable to plan of care.  Given the patient has home health care but no current PT.  Daughter did ask if she could speak to a Education officer, museum to help arrange for home physical therapy.  LACERATION REPAIR Performed by: Harvest Dark Authorized by: Harvest Dark Consent: Verbal consent obtained. Risks and benefits: risks, benefits and alternatives were discussed Consent given by: patient Patient identity confirmed: provided demographic data Prepped and Draped in normal sterile fashion Wound explored  Laceration Location: Left forehead  Laceration Length: 3 cm  No Foreign Bodies seen or palpated  Anesthesia: local  infiltration  Local anesthetic: lidocaine 1% without epinephrine  Anesthetic total: 3 ml  Irrigation method: syringe Amount of cleaning: standard  Skin closure: 5-0 rapid Vicryl  Number of sutures: 3  Technique: Simple interrupted  Patient tolerance: Patient tolerated the procedure well with no immediate complications.  Jacqueline [name] was evaluated in Emergency Department on 01/29/2020 for the symptoms described in the history of present illness. She was evaluated in the context of the global COVID-19 pandemic, which necessitated consideration that the patient might be at risk for infection with the SARS-CoV-2 virus that causes COVID-19. Institutional protocols and algorithms that pertain to the  evaluation of patients at risk for COVID-19 are in a state of rapid change based on information released by regulatory bodies including the CDC and federal and state organizations. These policies and algorithms were followed during the patient's care in the ED.  ____________________________________________   FINAL CLINICAL IMPRESSION(S) / ED DIAGNOSES  Fall Laceration   Harvest Dark, MD 01/29/20 1410

## 2020-01-29 NOTE — ED Notes (Signed)
Lidocaine given by EDP Paduchowski.

## 2020-01-29 NOTE — ED Notes (Signed)
Daughter states it is baseline for pt to sometimes get the year wrong. History of parkinson's. Pt's head and extremities constantly moving about. Pt cooperative. Laying in bed. Denies CP. Mild head pain at laceration about L eyebrow. Bleeding controlled. Gauze at site. Dried blood in pt's hair. Pt states lost balance as she sometimes does d/t the parkinson's.

## 2020-01-29 NOTE — TOC Initial Note (Addendum)
Transition of Care [facility]) - Initial/Assessment Note    Patient Details  Name: Jacqueline [name] MRN: [id number] Date of Birth: Jun 17, 1935  Transition of [facility] CM/SW Contact:    Ova Freshwater Phone Number:  [phone] 01/29/2020, 2:34 PM  Clinical Narrative:                  CSW informed by ED/RN about patient needs for home health.  TOC consult was not placed.  CSW called patient's daughter [name] and spoke to her about home health options.  Patient current with [facility], this CSW reached out to [place] with update on patient d/c.  TOC consult complete.       Patient Goals and CMS Choice        Expected Discharge Plan and Services                                                Prior Living Arrangements/Services                       Activities of Daily Living      Permission Sought/Granted                  Emotional Assessment              Admission diagnosis:  fall-ems Patient Active Problem List   Diagnosis Date Noted  . Goals of care, counseling/discussion   . Palliative care by specialist   . Pressure injury of skin 07/30/2019  . Closed left hip fracture ([place]) 07/29/2019  . Fall at home, initial encounter 07/29/2019  . Acquired hypothyroidism 07/29/2019  . Dementia due to Parkinson's disease without behavioral disturbance ([place]) 07/29/2019  . Reactive airway disease that is not asthma 07/29/2019  . Frequent falls 07/29/2019  . Pressure injury of skin of sacral region 06/12/2019  . Wound healing, delayed 06/12/2019  . Encounter for general adult medical examination with abnormal findings 06/21/2018  . Uncontrolled type 2 diabetes mellitus with hyperglycemia ([place]) 06/21/2018  . Asthma without status asthmaticus 07/08/2016  . Hyperlipidemia, unspecified 07/08/2016  . Dysphagia 01/07/2016  . Unsteadiness 10/01/2015  . Type 2 diabetes mellitus without complication, without long-term current use of insulin ([place])  08/07/2015  . Mixed Alzheimer's and vascular dementia ([place]) 08/06/2015  . Controlled type 2 diabetes mellitus without complication, without long-term current use of insulin ([place]) 02/12/2015  . Type 2 diabetes mellitus with stage 3 chronic kidney disease ([place]) 06/19/2014  . Essential hypertension 03/08/2014  . Parkinson's disease ([place]) 01/16/2014  . Fatigue 11/09/2013  . Diabetes mellitus, type II ([place]) 09/15/2013  . Difficulty walking 09/15/2013  . Dizziness 09/15/2013   PCP:  Lavera Guise, MD Pharmacy:   [id number] - [place], [place] [address] [place] [address] Phone: [phone] Fax: [phone]     Social Determinants of Health (SDOH) Interventions    Readmission Risk Interventions No flowsheet data found.

## 2020-01-29 NOTE — ED Notes (Signed)
First Nurse Note:  Patient coming [facility] from home. Picked up from floor by EMS. Patient conscious, alert, and oriented. Patient denies LOC. Patient with 1 1/4 laceration proximal to left eye. Patient c/o left hip and left should pain. Patient's pelvis stable for EMS. Patient able to bear weight with EMS. 204/99, HR 80.

## 2020-01-29 NOTE — ED Notes (Signed)
Called SW to notify pt needs to see them before d/c and that this is currently the only thing needed before d/c.

## 2020-02-01 DIAGNOSIS — G20 Parkinson's disease: Secondary | ICD-10-CM | POA: Diagnosis not present

## 2020-02-01 DIAGNOSIS — I129 Hypertensive chronic kidney disease with stage 1 through stage 4 chronic kidney disease, or unspecified chronic kidney disease: Secondary | ICD-10-CM | POA: Diagnosis not present

## 2020-02-01 DIAGNOSIS — R69 Illness, unspecified: Secondary | ICD-10-CM | POA: Diagnosis not present

## 2020-02-01 DIAGNOSIS — S01112D Laceration without foreign body of left eyelid and periocular area, subsequent encounter: Secondary | ICD-10-CM | POA: Diagnosis not present

## 2020-02-01 DIAGNOSIS — N183 Chronic kidney disease, stage 3 unspecified: Secondary | ICD-10-CM | POA: Diagnosis not present

## 2020-02-01 DIAGNOSIS — S72002D Fracture of unspecified part of neck of left femur, subsequent encounter for closed fracture with routine healing: Secondary | ICD-10-CM | POA: Diagnosis not present

## 2020-02-01 DIAGNOSIS — E1122 Type 2 diabetes mellitus with diabetic chronic kidney disease: Secondary | ICD-10-CM | POA: Diagnosis not present

## 2020-02-01 DIAGNOSIS — G309 Alzheimer's disease, unspecified: Secondary | ICD-10-CM | POA: Diagnosis not present

## 2020-02-01 DIAGNOSIS — R1310 Dysphagia, unspecified: Secondary | ICD-10-CM | POA: Diagnosis not present

## 2020-02-01 DIAGNOSIS — J45909 Unspecified asthma, uncomplicated: Secondary | ICD-10-CM | POA: Diagnosis not present

## 2020-02-05 DIAGNOSIS — G309 Alzheimer's disease, unspecified: Secondary | ICD-10-CM | POA: Diagnosis not present

## 2020-02-05 DIAGNOSIS — G20 Parkinson's disease: Secondary | ICD-10-CM | POA: Diagnosis not present

## 2020-02-05 DIAGNOSIS — R69 Illness, unspecified: Secondary | ICD-10-CM | POA: Diagnosis not present

## 2020-02-05 DIAGNOSIS — E1122 Type 2 diabetes mellitus with diabetic chronic kidney disease: Secondary | ICD-10-CM | POA: Diagnosis not present

## 2020-02-05 DIAGNOSIS — N183 Chronic kidney disease, stage 3 unspecified: Secondary | ICD-10-CM | POA: Diagnosis not present

## 2020-02-05 DIAGNOSIS — S01112D Laceration without foreign body of left eyelid and periocular area, subsequent encounter: Secondary | ICD-10-CM | POA: Diagnosis not present

## 2020-02-05 DIAGNOSIS — S72002D Fracture of unspecified part of neck of left femur, subsequent encounter for closed fracture with routine healing: Secondary | ICD-10-CM | POA: Diagnosis not present

## 2020-02-05 DIAGNOSIS — J45909 Unspecified asthma, uncomplicated: Secondary | ICD-10-CM | POA: Diagnosis not present

## 2020-02-05 DIAGNOSIS — I129 Hypertensive chronic kidney disease with stage 1 through stage 4 chronic kidney disease, or unspecified chronic kidney disease: Secondary | ICD-10-CM | POA: Diagnosis not present

## 2020-02-05 DIAGNOSIS — R1310 Dysphagia, unspecified: Secondary | ICD-10-CM | POA: Diagnosis not present

## 2020-02-06 DIAGNOSIS — S01112D Laceration without foreign body of left eyelid and periocular area, subsequent encounter: Secondary | ICD-10-CM | POA: Diagnosis not present

## 2020-02-06 DIAGNOSIS — G309 Alzheimer's disease, unspecified: Secondary | ICD-10-CM | POA: Diagnosis not present

## 2020-02-06 DIAGNOSIS — G20 Parkinson's disease: Secondary | ICD-10-CM | POA: Diagnosis not present

## 2020-02-06 DIAGNOSIS — R1310 Dysphagia, unspecified: Secondary | ICD-10-CM | POA: Diagnosis not present

## 2020-02-06 DIAGNOSIS — N183 Chronic kidney disease, stage 3 unspecified: Secondary | ICD-10-CM | POA: Diagnosis not present

## 2020-02-06 DIAGNOSIS — J45909 Unspecified asthma, uncomplicated: Secondary | ICD-10-CM | POA: Diagnosis not present

## 2020-02-06 DIAGNOSIS — I129 Hypertensive chronic kidney disease with stage 1 through stage 4 chronic kidney disease, or unspecified chronic kidney disease: Secondary | ICD-10-CM | POA: Diagnosis not present

## 2020-02-06 DIAGNOSIS — R69 Illness, unspecified: Secondary | ICD-10-CM | POA: Diagnosis not present

## 2020-02-06 DIAGNOSIS — S72002D Fracture of unspecified part of neck of left femur, subsequent encounter for closed fracture with routine healing: Secondary | ICD-10-CM | POA: Diagnosis not present

## 2020-02-06 DIAGNOSIS — E1122 Type 2 diabetes mellitus with diabetic chronic kidney disease: Secondary | ICD-10-CM | POA: Diagnosis not present

## 2020-02-07 DIAGNOSIS — I129 Hypertensive chronic kidney disease with stage 1 through stage 4 chronic kidney disease, or unspecified chronic kidney disease: Secondary | ICD-10-CM | POA: Diagnosis not present

## 2020-02-07 DIAGNOSIS — R69 Illness, unspecified: Secondary | ICD-10-CM | POA: Diagnosis not present

## 2020-02-07 DIAGNOSIS — N183 Chronic kidney disease, stage 3 unspecified: Secondary | ICD-10-CM | POA: Diagnosis not present

## 2020-02-07 DIAGNOSIS — E1122 Type 2 diabetes mellitus with diabetic chronic kidney disease: Secondary | ICD-10-CM | POA: Diagnosis not present

## 2020-02-07 DIAGNOSIS — G20 Parkinson's disease: Secondary | ICD-10-CM | POA: Diagnosis not present

## 2020-02-07 DIAGNOSIS — J45909 Unspecified asthma, uncomplicated: Secondary | ICD-10-CM | POA: Diagnosis not present

## 2020-02-07 DIAGNOSIS — S72002D Fracture of unspecified part of neck of left femur, subsequent encounter for closed fracture with routine healing: Secondary | ICD-10-CM | POA: Diagnosis not present

## 2020-02-07 DIAGNOSIS — G309 Alzheimer's disease, unspecified: Secondary | ICD-10-CM | POA: Diagnosis not present

## 2020-02-07 DIAGNOSIS — R1310 Dysphagia, unspecified: Secondary | ICD-10-CM | POA: Diagnosis not present

## 2020-02-07 DIAGNOSIS — S01112D Laceration without foreign body of left eyelid and periocular area, subsequent encounter: Secondary | ICD-10-CM | POA: Diagnosis not present

## 2020-02-08 DIAGNOSIS — R69 Illness, unspecified: Secondary | ICD-10-CM | POA: Diagnosis not present

## 2020-02-08 DIAGNOSIS — I129 Hypertensive chronic kidney disease with stage 1 through stage 4 chronic kidney disease, or unspecified chronic kidney disease: Secondary | ICD-10-CM | POA: Diagnosis not present

## 2020-02-08 DIAGNOSIS — R1310 Dysphagia, unspecified: Secondary | ICD-10-CM | POA: Diagnosis not present

## 2020-02-08 DIAGNOSIS — G20 Parkinson's disease: Secondary | ICD-10-CM | POA: Diagnosis not present

## 2020-02-08 DIAGNOSIS — G309 Alzheimer's disease, unspecified: Secondary | ICD-10-CM | POA: Diagnosis not present

## 2020-02-08 DIAGNOSIS — S01112D Laceration without foreign body of left eyelid and periocular area, subsequent encounter: Secondary | ICD-10-CM | POA: Diagnosis not present

## 2020-02-08 DIAGNOSIS — J45909 Unspecified asthma, uncomplicated: Secondary | ICD-10-CM | POA: Diagnosis not present

## 2020-02-08 DIAGNOSIS — S72002D Fracture of unspecified part of neck of left femur, subsequent encounter for closed fracture with routine healing: Secondary | ICD-10-CM | POA: Diagnosis not present

## 2020-02-08 DIAGNOSIS — E1122 Type 2 diabetes mellitus with diabetic chronic kidney disease: Secondary | ICD-10-CM | POA: Diagnosis not present

## 2020-02-08 DIAGNOSIS — N183 Chronic kidney disease, stage 3 unspecified: Secondary | ICD-10-CM | POA: Diagnosis not present

## 2020-02-14 DIAGNOSIS — R69 Illness, unspecified: Secondary | ICD-10-CM | POA: Diagnosis not present

## 2020-02-20 DIAGNOSIS — R69 Illness, unspecified: Secondary | ICD-10-CM | POA: Diagnosis not present

## 2020-02-20 DIAGNOSIS — S72002D Fracture of unspecified part of neck of left femur, subsequent encounter for closed fracture with routine healing: Secondary | ICD-10-CM | POA: Diagnosis not present

## 2020-02-20 DIAGNOSIS — E1122 Type 2 diabetes mellitus with diabetic chronic kidney disease: Secondary | ICD-10-CM | POA: Diagnosis not present

## 2020-02-20 DIAGNOSIS — S01112D Laceration without foreign body of left eyelid and periocular area, subsequent encounter: Secondary | ICD-10-CM | POA: Diagnosis not present

## 2020-02-20 DIAGNOSIS — G20 Parkinson's disease: Secondary | ICD-10-CM | POA: Diagnosis not present

## 2020-02-20 DIAGNOSIS — I129 Hypertensive chronic kidney disease with stage 1 through stage 4 chronic kidney disease, or unspecified chronic kidney disease: Secondary | ICD-10-CM | POA: Diagnosis not present

## 2020-02-20 DIAGNOSIS — R1310 Dysphagia, unspecified: Secondary | ICD-10-CM | POA: Diagnosis not present

## 2020-02-20 DIAGNOSIS — N183 Chronic kidney disease, stage 3 unspecified: Secondary | ICD-10-CM | POA: Diagnosis not present

## 2020-02-20 DIAGNOSIS — J45909 Unspecified asthma, uncomplicated: Secondary | ICD-10-CM | POA: Diagnosis not present

## 2020-02-20 DIAGNOSIS — G309 Alzheimer's disease, unspecified: Secondary | ICD-10-CM | POA: Diagnosis not present

## 2020-02-21 DIAGNOSIS — R69 Illness, unspecified: Secondary | ICD-10-CM | POA: Diagnosis not present

## 2020-02-21 DIAGNOSIS — J45909 Unspecified asthma, uncomplicated: Secondary | ICD-10-CM | POA: Diagnosis not present

## 2020-02-21 DIAGNOSIS — S01112D Laceration without foreign body of left eyelid and periocular area, subsequent encounter: Secondary | ICD-10-CM | POA: Diagnosis not present

## 2020-02-21 DIAGNOSIS — N183 Chronic kidney disease, stage 3 unspecified: Secondary | ICD-10-CM | POA: Diagnosis not present

## 2020-02-21 DIAGNOSIS — S72002D Fracture of unspecified part of neck of left femur, subsequent encounter for closed fracture with routine healing: Secondary | ICD-10-CM | POA: Diagnosis not present

## 2020-02-21 DIAGNOSIS — G309 Alzheimer's disease, unspecified: Secondary | ICD-10-CM | POA: Diagnosis not present

## 2020-02-21 DIAGNOSIS — I129 Hypertensive chronic kidney disease with stage 1 through stage 4 chronic kidney disease, or unspecified chronic kidney disease: Secondary | ICD-10-CM | POA: Diagnosis not present

## 2020-02-21 DIAGNOSIS — E1122 Type 2 diabetes mellitus with diabetic chronic kidney disease: Secondary | ICD-10-CM | POA: Diagnosis not present

## 2020-02-21 DIAGNOSIS — G20 Parkinson's disease: Secondary | ICD-10-CM | POA: Diagnosis not present

## 2020-02-21 DIAGNOSIS — R1310 Dysphagia, unspecified: Secondary | ICD-10-CM | POA: Diagnosis not present

## 2020-02-22 ENCOUNTER — Telehealth: Payer: Self-pay

## 2020-02-22 DIAGNOSIS — S01112D Laceration without foreign body of left eyelid and periocular area, subsequent encounter: Secondary | ICD-10-CM | POA: Diagnosis not present

## 2020-02-22 DIAGNOSIS — R69 Illness, unspecified: Secondary | ICD-10-CM | POA: Diagnosis not present

## 2020-02-22 DIAGNOSIS — J45909 Unspecified asthma, uncomplicated: Secondary | ICD-10-CM | POA: Diagnosis not present

## 2020-02-22 DIAGNOSIS — E1122 Type 2 diabetes mellitus with diabetic chronic kidney disease: Secondary | ICD-10-CM | POA: Diagnosis not present

## 2020-02-22 DIAGNOSIS — G309 Alzheimer's disease, unspecified: Secondary | ICD-10-CM | POA: Diagnosis not present

## 2020-02-22 DIAGNOSIS — I129 Hypertensive chronic kidney disease with stage 1 through stage 4 chronic kidney disease, or unspecified chronic kidney disease: Secondary | ICD-10-CM | POA: Diagnosis not present

## 2020-02-22 DIAGNOSIS — R1310 Dysphagia, unspecified: Secondary | ICD-10-CM | POA: Diagnosis not present

## 2020-02-22 DIAGNOSIS — N183 Chronic kidney disease, stage 3 unspecified: Secondary | ICD-10-CM | POA: Diagnosis not present

## 2020-02-22 DIAGNOSIS — G20 Parkinson's disease: Secondary | ICD-10-CM | POA: Diagnosis not present

## 2020-02-22 DIAGNOSIS — S72002D Fracture of unspecified part of neck of left femur, subsequent encounter for closed fracture with routine healing: Secondary | ICD-10-CM | POA: Diagnosis not present

## 2020-02-22 NOTE — Telephone Encounter (Signed)
[facility] nurse called that had fall today and injures her left elbow and bruising no pain she said id doing ok advised nurse to let pt know any changes of pain or swelling need to been and also spoke with pt shw was ok just advised to ice pack and any changes she need to been seen

## 2020-02-23 DIAGNOSIS — R69 Illness, unspecified: Secondary | ICD-10-CM | POA: Diagnosis not present

## 2020-02-23 DIAGNOSIS — G309 Alzheimer's disease, unspecified: Secondary | ICD-10-CM | POA: Diagnosis not present

## 2020-02-23 DIAGNOSIS — N183 Chronic kidney disease, stage 3 unspecified: Secondary | ICD-10-CM | POA: Diagnosis not present

## 2020-02-23 DIAGNOSIS — J45909 Unspecified asthma, uncomplicated: Secondary | ICD-10-CM | POA: Diagnosis not present

## 2020-02-23 DIAGNOSIS — G20 Parkinson's disease: Secondary | ICD-10-CM | POA: Diagnosis not present

## 2020-02-23 DIAGNOSIS — S01112D Laceration without foreign body of left eyelid and periocular area, subsequent encounter: Secondary | ICD-10-CM | POA: Diagnosis not present

## 2020-02-23 DIAGNOSIS — R1310 Dysphagia, unspecified: Secondary | ICD-10-CM | POA: Diagnosis not present

## 2020-02-23 DIAGNOSIS — I129 Hypertensive chronic kidney disease with stage 1 through stage 4 chronic kidney disease, or unspecified chronic kidney disease: Secondary | ICD-10-CM | POA: Diagnosis not present

## 2020-02-23 DIAGNOSIS — S72002D Fracture of unspecified part of neck of left femur, subsequent encounter for closed fracture with routine healing: Secondary | ICD-10-CM | POA: Diagnosis not present

## 2020-02-23 DIAGNOSIS — E1122 Type 2 diabetes mellitus with diabetic chronic kidney disease: Secondary | ICD-10-CM | POA: Diagnosis not present

## 2020-02-26 DIAGNOSIS — G20 Parkinson's disease: Secondary | ICD-10-CM | POA: Diagnosis not present

## 2020-02-26 DIAGNOSIS — I129 Hypertensive chronic kidney disease with stage 1 through stage 4 chronic kidney disease, or unspecified chronic kidney disease: Secondary | ICD-10-CM | POA: Diagnosis not present

## 2020-02-26 DIAGNOSIS — E1122 Type 2 diabetes mellitus with diabetic chronic kidney disease: Secondary | ICD-10-CM | POA: Diagnosis not present

## 2020-02-26 DIAGNOSIS — R1310 Dysphagia, unspecified: Secondary | ICD-10-CM | POA: Diagnosis not present

## 2020-02-26 DIAGNOSIS — S72002D Fracture of unspecified part of neck of left femur, subsequent encounter for closed fracture with routine healing: Secondary | ICD-10-CM | POA: Diagnosis not present

## 2020-02-26 DIAGNOSIS — S01112D Laceration without foreign body of left eyelid and periocular area, subsequent encounter: Secondary | ICD-10-CM | POA: Diagnosis not present

## 2020-02-26 DIAGNOSIS — R69 Illness, unspecified: Secondary | ICD-10-CM | POA: Diagnosis not present

## 2020-02-26 DIAGNOSIS — N183 Chronic kidney disease, stage 3 unspecified: Secondary | ICD-10-CM | POA: Diagnosis not present

## 2020-02-26 DIAGNOSIS — J45909 Unspecified asthma, uncomplicated: Secondary | ICD-10-CM | POA: Diagnosis not present

## 2020-02-26 DIAGNOSIS — G309 Alzheimer's disease, unspecified: Secondary | ICD-10-CM | POA: Diagnosis not present

## 2020-02-28 DIAGNOSIS — M810 Age-related osteoporosis without current pathological fracture: Secondary | ICD-10-CM | POA: Diagnosis not present

## 2020-02-28 DIAGNOSIS — E782 Mixed hyperlipidemia: Secondary | ICD-10-CM | POA: Diagnosis not present

## 2020-02-28 DIAGNOSIS — E039 Hypothyroidism, unspecified: Secondary | ICD-10-CM | POA: Diagnosis not present

## 2020-02-28 DIAGNOSIS — Z794 Long term (current) use of insulin: Secondary | ICD-10-CM | POA: Diagnosis not present

## 2020-02-28 DIAGNOSIS — R809 Proteinuria, unspecified: Secondary | ICD-10-CM | POA: Diagnosis not present

## 2020-02-28 DIAGNOSIS — E1129 Type 2 diabetes mellitus with other diabetic kidney complication: Secondary | ICD-10-CM | POA: Diagnosis not present

## 2020-02-29 DIAGNOSIS — E1122 Type 2 diabetes mellitus with diabetic chronic kidney disease: Secondary | ICD-10-CM | POA: Diagnosis not present

## 2020-02-29 DIAGNOSIS — R69 Illness, unspecified: Secondary | ICD-10-CM | POA: Diagnosis not present

## 2020-02-29 DIAGNOSIS — R1310 Dysphagia, unspecified: Secondary | ICD-10-CM | POA: Diagnosis not present

## 2020-02-29 DIAGNOSIS — G20 Parkinson's disease: Secondary | ICD-10-CM | POA: Diagnosis not present

## 2020-02-29 DIAGNOSIS — N183 Chronic kidney disease, stage 3 unspecified: Secondary | ICD-10-CM | POA: Diagnosis not present

## 2020-02-29 DIAGNOSIS — I129 Hypertensive chronic kidney disease with stage 1 through stage 4 chronic kidney disease, or unspecified chronic kidney disease: Secondary | ICD-10-CM | POA: Diagnosis not present

## 2020-02-29 DIAGNOSIS — G309 Alzheimer's disease, unspecified: Secondary | ICD-10-CM | POA: Diagnosis not present

## 2020-02-29 DIAGNOSIS — J45909 Unspecified asthma, uncomplicated: Secondary | ICD-10-CM | POA: Diagnosis not present

## 2020-02-29 DIAGNOSIS — S72002D Fracture of unspecified part of neck of left femur, subsequent encounter for closed fracture with routine healing: Secondary | ICD-10-CM | POA: Diagnosis not present

## 2020-02-29 DIAGNOSIS — S01112D Laceration without foreign body of left eyelid and periocular area, subsequent encounter: Secondary | ICD-10-CM | POA: Diagnosis not present

## 2020-03-01 DIAGNOSIS — N183 Chronic kidney disease, stage 3 unspecified: Secondary | ICD-10-CM | POA: Diagnosis not present

## 2020-03-01 DIAGNOSIS — R69 Illness, unspecified: Secondary | ICD-10-CM | POA: Diagnosis not present

## 2020-03-01 DIAGNOSIS — G309 Alzheimer's disease, unspecified: Secondary | ICD-10-CM | POA: Diagnosis not present

## 2020-03-01 DIAGNOSIS — S72002D Fracture of unspecified part of neck of left femur, subsequent encounter for closed fracture with routine healing: Secondary | ICD-10-CM | POA: Diagnosis not present

## 2020-03-01 DIAGNOSIS — S01112D Laceration without foreign body of left eyelid and periocular area, subsequent encounter: Secondary | ICD-10-CM | POA: Diagnosis not present

## 2020-03-01 DIAGNOSIS — I129 Hypertensive chronic kidney disease with stage 1 through stage 4 chronic kidney disease, or unspecified chronic kidney disease: Secondary | ICD-10-CM | POA: Diagnosis not present

## 2020-03-01 DIAGNOSIS — E1122 Type 2 diabetes mellitus with diabetic chronic kidney disease: Secondary | ICD-10-CM | POA: Diagnosis not present

## 2020-03-01 DIAGNOSIS — J45909 Unspecified asthma, uncomplicated: Secondary | ICD-10-CM | POA: Diagnosis not present

## 2020-03-01 DIAGNOSIS — R1310 Dysphagia, unspecified: Secondary | ICD-10-CM | POA: Diagnosis not present

## 2020-03-01 DIAGNOSIS — G20 Parkinson's disease: Secondary | ICD-10-CM | POA: Diagnosis not present

## 2020-03-04 DIAGNOSIS — J45909 Unspecified asthma, uncomplicated: Secondary | ICD-10-CM | POA: Diagnosis not present

## 2020-03-04 DIAGNOSIS — S72002D Fracture of unspecified part of neck of left femur, subsequent encounter for closed fracture with routine healing: Secondary | ICD-10-CM | POA: Diagnosis not present

## 2020-03-04 DIAGNOSIS — I129 Hypertensive chronic kidney disease with stage 1 through stage 4 chronic kidney disease, or unspecified chronic kidney disease: Secondary | ICD-10-CM | POA: Diagnosis not present

## 2020-03-04 DIAGNOSIS — G309 Alzheimer's disease, unspecified: Secondary | ICD-10-CM | POA: Diagnosis not present

## 2020-03-04 DIAGNOSIS — E1122 Type 2 diabetes mellitus with diabetic chronic kidney disease: Secondary | ICD-10-CM | POA: Diagnosis not present

## 2020-03-04 DIAGNOSIS — G20 Parkinson's disease: Secondary | ICD-10-CM | POA: Diagnosis not present

## 2020-03-04 DIAGNOSIS — S01112D Laceration without foreign body of left eyelid and periocular area, subsequent encounter: Secondary | ICD-10-CM | POA: Diagnosis not present

## 2020-03-04 DIAGNOSIS — R69 Illness, unspecified: Secondary | ICD-10-CM | POA: Diagnosis not present

## 2020-03-04 DIAGNOSIS — R1310 Dysphagia, unspecified: Secondary | ICD-10-CM | POA: Diagnosis not present

## 2020-03-04 DIAGNOSIS — N183 Chronic kidney disease, stage 3 unspecified: Secondary | ICD-10-CM | POA: Diagnosis not present

## 2020-03-05 DIAGNOSIS — J45909 Unspecified asthma, uncomplicated: Secondary | ICD-10-CM | POA: Diagnosis not present

## 2020-03-05 DIAGNOSIS — G20 Parkinson's disease: Secondary | ICD-10-CM | POA: Diagnosis not present

## 2020-03-05 DIAGNOSIS — S72002D Fracture of unspecified part of neck of left femur, subsequent encounter for closed fracture with routine healing: Secondary | ICD-10-CM | POA: Diagnosis not present

## 2020-03-05 DIAGNOSIS — N183 Chronic kidney disease, stage 3 unspecified: Secondary | ICD-10-CM | POA: Diagnosis not present

## 2020-03-05 DIAGNOSIS — S01112D Laceration without foreign body of left eyelid and periocular area, subsequent encounter: Secondary | ICD-10-CM | POA: Diagnosis not present

## 2020-03-05 DIAGNOSIS — I129 Hypertensive chronic kidney disease with stage 1 through stage 4 chronic kidney disease, or unspecified chronic kidney disease: Secondary | ICD-10-CM | POA: Diagnosis not present

## 2020-03-05 DIAGNOSIS — G309 Alzheimer's disease, unspecified: Secondary | ICD-10-CM | POA: Diagnosis not present

## 2020-03-05 DIAGNOSIS — E1122 Type 2 diabetes mellitus with diabetic chronic kidney disease: Secondary | ICD-10-CM | POA: Diagnosis not present

## 2020-03-05 DIAGNOSIS — R69 Illness, unspecified: Secondary | ICD-10-CM | POA: Diagnosis not present

## 2020-03-05 DIAGNOSIS — R1310 Dysphagia, unspecified: Secondary | ICD-10-CM | POA: Diagnosis not present

## 2020-03-06 DIAGNOSIS — Z794 Long term (current) use of insulin: Secondary | ICD-10-CM | POA: Diagnosis not present

## 2020-03-06 DIAGNOSIS — E042 Nontoxic multinodular goiter: Secondary | ICD-10-CM | POA: Diagnosis not present

## 2020-03-06 DIAGNOSIS — E039 Hypothyroidism, unspecified: Secondary | ICD-10-CM | POA: Diagnosis not present

## 2020-03-06 DIAGNOSIS — M810 Age-related osteoporosis without current pathological fracture: Secondary | ICD-10-CM | POA: Diagnosis not present

## 2020-03-06 DIAGNOSIS — E1129 Type 2 diabetes mellitus with other diabetic kidney complication: Secondary | ICD-10-CM | POA: Diagnosis not present

## 2020-03-06 DIAGNOSIS — R809 Proteinuria, unspecified: Secondary | ICD-10-CM | POA: Diagnosis not present

## 2020-03-07 DIAGNOSIS — R69 Illness, unspecified: Secondary | ICD-10-CM | POA: Diagnosis not present

## 2020-03-07 DIAGNOSIS — N183 Chronic kidney disease, stage 3 unspecified: Secondary | ICD-10-CM | POA: Diagnosis not present

## 2020-03-07 DIAGNOSIS — R1310 Dysphagia, unspecified: Secondary | ICD-10-CM | POA: Diagnosis not present

## 2020-03-07 DIAGNOSIS — E1122 Type 2 diabetes mellitus with diabetic chronic kidney disease: Secondary | ICD-10-CM | POA: Diagnosis not present

## 2020-03-07 DIAGNOSIS — G309 Alzheimer's disease, unspecified: Secondary | ICD-10-CM | POA: Diagnosis not present

## 2020-03-07 DIAGNOSIS — J45909 Unspecified asthma, uncomplicated: Secondary | ICD-10-CM | POA: Diagnosis not present

## 2020-03-07 DIAGNOSIS — I129 Hypertensive chronic kidney disease with stage 1 through stage 4 chronic kidney disease, or unspecified chronic kidney disease: Secondary | ICD-10-CM | POA: Diagnosis not present

## 2020-03-07 DIAGNOSIS — G20 Parkinson's disease: Secondary | ICD-10-CM | POA: Diagnosis not present

## 2020-03-07 DIAGNOSIS — S72002D Fracture of unspecified part of neck of left femur, subsequent encounter for closed fracture with routine healing: Secondary | ICD-10-CM | POA: Diagnosis not present

## 2020-03-07 DIAGNOSIS — S01112D Laceration without foreign body of left eyelid and periocular area, subsequent encounter: Secondary | ICD-10-CM | POA: Diagnosis not present

## 2020-03-08 DIAGNOSIS — S72002D Fracture of unspecified part of neck of left femur, subsequent encounter for closed fracture with routine healing: Secondary | ICD-10-CM | POA: Diagnosis not present

## 2020-03-08 DIAGNOSIS — E1122 Type 2 diabetes mellitus with diabetic chronic kidney disease: Secondary | ICD-10-CM | POA: Diagnosis not present

## 2020-03-08 DIAGNOSIS — R1310 Dysphagia, unspecified: Secondary | ICD-10-CM | POA: Diagnosis not present

## 2020-03-08 DIAGNOSIS — S01112D Laceration without foreign body of left eyelid and periocular area, subsequent encounter: Secondary | ICD-10-CM | POA: Diagnosis not present

## 2020-03-08 DIAGNOSIS — G309 Alzheimer's disease, unspecified: Secondary | ICD-10-CM | POA: Diagnosis not present

## 2020-03-08 DIAGNOSIS — R69 Illness, unspecified: Secondary | ICD-10-CM | POA: Diagnosis not present

## 2020-03-08 DIAGNOSIS — I129 Hypertensive chronic kidney disease with stage 1 through stage 4 chronic kidney disease, or unspecified chronic kidney disease: Secondary | ICD-10-CM | POA: Diagnosis not present

## 2020-03-08 DIAGNOSIS — N183 Chronic kidney disease, stage 3 unspecified: Secondary | ICD-10-CM | POA: Diagnosis not present

## 2020-03-08 DIAGNOSIS — J45909 Unspecified asthma, uncomplicated: Secondary | ICD-10-CM | POA: Diagnosis not present

## 2020-03-08 DIAGNOSIS — G20 Parkinson's disease: Secondary | ICD-10-CM | POA: Diagnosis not present

## 2020-03-11 DIAGNOSIS — S01112D Laceration without foreign body of left eyelid and periocular area, subsequent encounter: Secondary | ICD-10-CM | POA: Diagnosis not present

## 2020-03-11 DIAGNOSIS — G309 Alzheimer's disease, unspecified: Secondary | ICD-10-CM | POA: Diagnosis not present

## 2020-03-11 DIAGNOSIS — R69 Illness, unspecified: Secondary | ICD-10-CM | POA: Diagnosis not present

## 2020-03-11 DIAGNOSIS — G20 Parkinson's disease: Secondary | ICD-10-CM | POA: Diagnosis not present

## 2020-03-11 DIAGNOSIS — S72002D Fracture of unspecified part of neck of left femur, subsequent encounter for closed fracture with routine healing: Secondary | ICD-10-CM | POA: Diagnosis not present

## 2020-03-11 DIAGNOSIS — I129 Hypertensive chronic kidney disease with stage 1 through stage 4 chronic kidney disease, or unspecified chronic kidney disease: Secondary | ICD-10-CM | POA: Diagnosis not present

## 2020-03-11 DIAGNOSIS — E1122 Type 2 diabetes mellitus with diabetic chronic kidney disease: Secondary | ICD-10-CM | POA: Diagnosis not present

## 2020-03-11 DIAGNOSIS — N183 Chronic kidney disease, stage 3 unspecified: Secondary | ICD-10-CM | POA: Diagnosis not present

## 2020-03-11 DIAGNOSIS — R1310 Dysphagia, unspecified: Secondary | ICD-10-CM | POA: Diagnosis not present

## 2020-03-11 DIAGNOSIS — J45909 Unspecified asthma, uncomplicated: Secondary | ICD-10-CM | POA: Diagnosis not present

## 2020-03-14 DIAGNOSIS — I129 Hypertensive chronic kidney disease with stage 1 through stage 4 chronic kidney disease, or unspecified chronic kidney disease: Secondary | ICD-10-CM | POA: Diagnosis not present

## 2020-03-14 DIAGNOSIS — N183 Chronic kidney disease, stage 3 unspecified: Secondary | ICD-10-CM | POA: Diagnosis not present

## 2020-03-14 DIAGNOSIS — R69 Illness, unspecified: Secondary | ICD-10-CM | POA: Diagnosis not present

## 2020-03-14 DIAGNOSIS — E1122 Type 2 diabetes mellitus with diabetic chronic kidney disease: Secondary | ICD-10-CM | POA: Diagnosis not present

## 2020-03-14 DIAGNOSIS — S01112D Laceration without foreign body of left eyelid and periocular area, subsequent encounter: Secondary | ICD-10-CM | POA: Diagnosis not present

## 2020-03-14 DIAGNOSIS — G309 Alzheimer's disease, unspecified: Secondary | ICD-10-CM | POA: Diagnosis not present

## 2020-03-14 DIAGNOSIS — S72002D Fracture of unspecified part of neck of left femur, subsequent encounter for closed fracture with routine healing: Secondary | ICD-10-CM | POA: Diagnosis not present

## 2020-03-14 DIAGNOSIS — G20 Parkinson's disease: Secondary | ICD-10-CM | POA: Diagnosis not present

## 2020-03-14 DIAGNOSIS — R1310 Dysphagia, unspecified: Secondary | ICD-10-CM | POA: Diagnosis not present

## 2020-03-14 DIAGNOSIS — J45909 Unspecified asthma, uncomplicated: Secondary | ICD-10-CM | POA: Diagnosis not present

## 2020-03-19 DIAGNOSIS — R69 Illness, unspecified: Secondary | ICD-10-CM | POA: Diagnosis not present

## 2020-03-21 ENCOUNTER — Other Ambulatory Visit: Payer: Self-pay | Admitting: Internal Medicine

## 2020-03-21 DIAGNOSIS — Z23 Encounter for immunization: Secondary | ICD-10-CM

## 2020-03-21 DIAGNOSIS — G20 Parkinson's disease: Secondary | ICD-10-CM

## 2020-03-21 DIAGNOSIS — Z794 Long term (current) use of insulin: Secondary | ICD-10-CM

## 2020-03-21 DIAGNOSIS — E782 Mixed hyperlipidemia: Secondary | ICD-10-CM

## 2020-03-21 DIAGNOSIS — L89152 Pressure ulcer of sacral region, stage 2: Secondary | ICD-10-CM

## 2020-03-21 DIAGNOSIS — Z5320 Procedure and treatment not carried out because of patient's decision for unspecified reasons: Secondary | ICD-10-CM

## 2020-03-21 DIAGNOSIS — G20A1 Parkinson's disease without dyskinesia, without mention of fluctuations: Secondary | ICD-10-CM

## 2020-03-21 DIAGNOSIS — E119 Type 2 diabetes mellitus without complications: Secondary | ICD-10-CM

## 2020-03-21 DIAGNOSIS — Z0001 Encounter for general adult medical examination with abnormal findings: Secondary | ICD-10-CM

## 2020-03-21 DIAGNOSIS — R300 Dysuria: Secondary | ICD-10-CM

## 2020-03-25 ENCOUNTER — Other Ambulatory Visit: Payer: Self-pay | Admitting: Internal Medicine

## 2020-03-25 DIAGNOSIS — Z23 Encounter for immunization: Secondary | ICD-10-CM

## 2020-03-25 DIAGNOSIS — Z0001 Encounter for general adult medical examination with abnormal findings: Secondary | ICD-10-CM

## 2020-03-25 DIAGNOSIS — E119 Type 2 diabetes mellitus without complications: Secondary | ICD-10-CM

## 2020-03-25 DIAGNOSIS — E782 Mixed hyperlipidemia: Secondary | ICD-10-CM

## 2020-03-25 DIAGNOSIS — Z794 Long term (current) use of insulin: Secondary | ICD-10-CM

## 2020-03-25 DIAGNOSIS — G20A1 Parkinson's disease without dyskinesia, without mention of fluctuations: Secondary | ICD-10-CM

## 2020-03-25 DIAGNOSIS — R300 Dysuria: Secondary | ICD-10-CM

## 2020-03-25 DIAGNOSIS — L89152 Pressure ulcer of sacral region, stage 2: Secondary | ICD-10-CM

## 2020-03-25 DIAGNOSIS — G20 Parkinson's disease: Secondary | ICD-10-CM

## 2020-03-25 DIAGNOSIS — Z5320 Procedure and treatment not carried out because of patient's decision for unspecified reasons: Secondary | ICD-10-CM

## 2020-03-26 ENCOUNTER — Encounter: Payer: Self-pay | Admitting: Nurse Practitioner

## 2020-03-28 DIAGNOSIS — Z8639 Personal history of other endocrine, nutritional and metabolic disease: Secondary | ICD-10-CM | POA: Diagnosis not present

## 2020-03-28 DIAGNOSIS — G478 Other sleep disorders: Secondary | ICD-10-CM | POA: Diagnosis not present

## 2020-03-28 DIAGNOSIS — R413 Other amnesia: Secondary | ICD-10-CM | POA: Diagnosis not present

## 2020-03-28 DIAGNOSIS — G20 Parkinson's disease: Secondary | ICD-10-CM | POA: Diagnosis not present

## 2020-03-28 DIAGNOSIS — R42 Dizziness and giddiness: Secondary | ICD-10-CM | POA: Diagnosis not present

## 2020-03-28 DIAGNOSIS — I959 Hypotension, unspecified: Secondary | ICD-10-CM | POA: Diagnosis not present

## 2020-04-05 DIAGNOSIS — M810 Age-related osteoporosis without current pathological fracture: Secondary | ICD-10-CM | POA: Diagnosis not present

## 2020-04-24 DIAGNOSIS — R69 Illness, unspecified: Secondary | ICD-10-CM | POA: Diagnosis not present

## 2020-04-25 ENCOUNTER — Emergency Department
Admission: EM | Admit: 2020-04-25 | Discharge: 2020-04-25 | Disposition: A | Discharge status: Home / Self Care | Payer: Medicare HMO | Attending: Emergency Medicine | Admitting: Emergency Medicine

## 2020-04-25 ENCOUNTER — Other Ambulatory Visit: Payer: Self-pay

## 2020-04-25 ENCOUNTER — Encounter: Payer: Self-pay | Admitting: Emergency Medicine

## 2020-04-25 ENCOUNTER — Emergency Department: Payer: Medicare HMO

## 2020-04-25 DIAGNOSIS — E11649 Type 2 diabetes mellitus with hypoglycemia without coma: Secondary | ICD-10-CM | POA: Insufficient documentation

## 2020-04-25 DIAGNOSIS — I10 Essential (primary) hypertension: Secondary | ICD-10-CM | POA: Diagnosis not present

## 2020-04-25 DIAGNOSIS — Z7951 Long term (current) use of inhaled steroids: Secondary | ICD-10-CM | POA: Insufficient documentation

## 2020-04-25 DIAGNOSIS — R410 Disorientation, unspecified: Secondary | ICD-10-CM | POA: Diagnosis not present

## 2020-04-25 DIAGNOSIS — E1122 Type 2 diabetes mellitus with diabetic chronic kidney disease: Secondary | ICD-10-CM | POA: Insufficient documentation

## 2020-04-25 DIAGNOSIS — Z79899 Other long term (current) drug therapy: Secondary | ICD-10-CM | POA: Diagnosis not present

## 2020-04-25 DIAGNOSIS — N183 Chronic kidney disease, stage 3 unspecified: Secondary | ICD-10-CM | POA: Diagnosis not present

## 2020-04-25 DIAGNOSIS — I129 Hypertensive chronic kidney disease with stage 1 through stage 4 chronic kidney disease, or unspecified chronic kidney disease: Secondary | ICD-10-CM | POA: Diagnosis not present

## 2020-04-25 DIAGNOSIS — R4182 Altered mental status, unspecified: Secondary | ICD-10-CM | POA: Diagnosis not present

## 2020-04-25 DIAGNOSIS — G20 Parkinson's disease: Secondary | ICD-10-CM | POA: Insufficient documentation

## 2020-04-25 DIAGNOSIS — R109 Unspecified abdominal pain: Secondary | ICD-10-CM | POA: Insufficient documentation

## 2020-04-25 DIAGNOSIS — E039 Hypothyroidism, unspecified: Secondary | ICD-10-CM | POA: Diagnosis not present

## 2020-04-25 DIAGNOSIS — N390 Urinary tract infection, site not specified: Secondary | ICD-10-CM | POA: Diagnosis not present

## 2020-04-25 DIAGNOSIS — R300 Dysuria: Secondary | ICD-10-CM | POA: Insufficient documentation

## 2020-04-25 DIAGNOSIS — R569 Unspecified convulsions: Secondary | ICD-10-CM | POA: Diagnosis not present

## 2020-04-25 DIAGNOSIS — Z794 Long term (current) use of insulin: Secondary | ICD-10-CM | POA: Insufficient documentation

## 2020-04-25 DIAGNOSIS — F0390 Unspecified dementia without behavioral disturbance: Secondary | ICD-10-CM | POA: Insufficient documentation

## 2020-04-25 DIAGNOSIS — J45909 Unspecified asthma, uncomplicated: Secondary | ICD-10-CM | POA: Insufficient documentation

## 2020-04-25 DIAGNOSIS — E162 Hypoglycemia, unspecified: Secondary | ICD-10-CM | POA: Diagnosis not present

## 2020-04-25 DIAGNOSIS — R69 Illness, unspecified: Secondary | ICD-10-CM | POA: Diagnosis not present

## 2020-04-25 DIAGNOSIS — E161 Other hypoglycemia: Secondary | ICD-10-CM | POA: Diagnosis not present

## 2020-04-25 LAB — CBC WITH DIFFERENTIAL/PLATELET
Abs Immature Granulocytes: 0.01 10*3/uL (ref 0.00–0.07)
Basophils Absolute: 0.1 10*3/uL (ref 0.0–0.1)
Basophils Relative: 1 %
Eosinophils Absolute: 0.1 10*3/uL (ref 0.0–0.5)
Eosinophils Relative: 2 %
HCT: 38.5 % (ref 36.0–46.0)
Hemoglobin: 13.5 g/dL (ref 12.0–15.0)
Immature Granulocytes: 0 %
Lymphocytes Relative: 30 %
Lymphs Abs: 1.7 10*3/uL (ref 0.7–4.0)
MCH: 31.5 pg (ref 26.0–34.0)
MCHC: 35.1 g/dL (ref 30.0–36.0)
MCV: 89.7 fL (ref 80.0–100.0)
Monocytes Absolute: 0.5 10*3/uL (ref 0.1–1.0)
Monocytes Relative: 9 %
Neutro Abs: 3.4 10*3/uL (ref 1.7–7.7)
Neutrophils Relative %: 58 %
Platelets: 209 10*3/uL (ref 150–400)
RBC: 4.29 MIL/uL (ref 3.87–5.11)
RDW: 12.8 % (ref 11.5–15.5)
WBC: 5.7 10*3/uL (ref 4.0–10.5)
nRBC: 0 % (ref 0.0–0.2)

## 2020-04-25 LAB — URINALYSIS, COMPLETE (UACMP) WITH MICROSCOPIC
Bilirubin Urine: NEGATIVE
Glucose, UA: 50 mg/dL — AB
Ketones, ur: 5 mg/dL — AB
Nitrite: NEGATIVE
Protein, ur: 100 mg/dL — AB
Specific Gravity, Urine: 1.021 (ref 1.005–1.030)
pH: 5 (ref 5.0–8.0)

## 2020-04-25 LAB — BASIC METABOLIC PANEL
Anion gap: 13 (ref 5–15)
BUN: 25 mg/dL — ABNORMAL HIGH (ref 8–23)
CO2: 27 mmol/L (ref 22–32)
Calcium: 8.9 mg/dL (ref 8.9–10.3)
Chloride: 100 mmol/L (ref 98–111)
Creatinine, Ser: 1 mg/dL (ref 0.44–1.00)
GFR, Estimated: 56 mL/min — ABNORMAL LOW (ref >60)
Glucose, Bld: 47 mg/dL — ABNORMAL LOW (ref 70–99)
Potassium: 3.6 mmol/L (ref 3.5–5.1)
Sodium: 140 mmol/L (ref 135–145)

## 2020-04-25 LAB — CBG MONITORING, ED
Glucose-Capillary: 38 mg/dL — CL (ref 70–99)
Glucose-Capillary: 62 mg/dL — ABNORMAL LOW (ref 70–99)
Glucose-Capillary: 73 mg/dL (ref 70–99)
Glucose-Capillary: 113 mg/dL — ABNORMAL HIGH (ref 70–99)

## 2020-04-25 MED ORDER — CEPHALEXIN 500 MG PO CAPS
500.0000 mg | ORAL_CAPSULE | Freq: Once | ORAL | Status: AC
  Administered 2020-04-25: 500 mg via ORAL
  Filled 2020-04-25: qty 1

## 2020-04-25 MED ORDER — DEXTROSE 50 % IV SOLN
INTRAVENOUS | Status: AC
  Administered 2020-04-25: 50 mL
  Filled 2020-04-25: qty 50

## 2020-04-25 MED ORDER — CEPHALEXIN 500 MG PO CAPS: 500.0000 mg | ORAL_CAPSULE | Freq: Four times a day (QID) | ORAL | 0 refills | Status: AC

## 2020-04-25 MED ORDER — DEXTROSE 50 % IV SOLN: 1.0000 | Freq: Once | INTRAVENOUS | Status: AC

## 2020-04-25 NOTE — ED Provider Notes (Addendum)
[facility] Emergency Department Provider Note ____________________________________________   First MD Initiated Contact with Patient 04/25/20 1614     (approximate)  I have reviewed the triage vital signs and the nursing notes.  HISTORY  Chief Complaint Altered Mental Status   HPI Jacqueline [name] is a 84 y.o. femalewho presents to the ED for evaluation of altered mentation.   Chart review indicates hx Parkinson's disease, DM on insulin, hypothyroidism.  Patient presents to the ED from home for concerns of altered mental status, shaking and poor p.o. intake. Patient lives at home with the assistance of 24/7 caregivers and 2 daughters.   History obtained from patient, daughter at the bedside. They report the patient was in her typical state of health until this morning when and she started "acting oddly", noted to be waving both of her arms into the air and "talking out of her head."  They report concerned that her waving her arms over her head was a seizure, and so called 911 for evaluation.  EMS found the patient with a glucose of 57 and provided oral juice with reported improvement of her glucose levels.  Presentation to the ED, patient is reporting lower abdominal discomfort and "occasional" dysuria.  She reports poor p.o. intake and lack of appetite such that she does not want to eat very much.  Denies stool changes, emesis, fevers, chest pain, shortness of breath or syncope.  No tongue biting or incontinence.   Past Medical History:  Diagnosis Date  . Asthma   . Collapsed lung   . Dementia ([place])   . Diabetes mellitus without complication ([place])    Patient takes Insulin twice a day.   Marland Kitchen GERD (gastroesophageal reflux disease)   . Hyperlipidemia   . Hypertension   . Hypothyroidism   . Parkinson's disease ([place])   . Reactive airway disease     Patient Active Problem List   Diagnosis Date Noted  . Goals of care, counseling/discussion   .  Palliative care by specialist   . Pressure injury of skin 07/30/2019  . Closed left hip fracture ([place]) 07/29/2019  . Fall at home, initial encounter 07/29/2019  . Acquired hypothyroidism 07/29/2019  . Dementia due to Parkinson's disease without behavioral disturbance ([place]) 07/29/2019  . Reactive airway disease that is not asthma 07/29/2019  . Frequent falls 07/29/2019  . Pressure injury of skin of sacral region 06/12/2019  . Wound healing, delayed 06/12/2019  . Encounter for general adult medical examination with abnormal findings 06/21/2018  . Uncontrolled type 2 diabetes mellitus with hyperglycemia ([place]) 06/21/2018  . Asthma without status asthmaticus 07/08/2016  . Hyperlipidemia, unspecified 07/08/2016  . Dysphagia 01/07/2016  . Unsteadiness 10/01/2015  . Type 2 diabetes mellitus without complication, without long-term current use of insulin ([place]) 08/07/2015  . Mixed Alzheimer's and vascular dementia ([place]) 08/06/2015  . Controlled type 2 diabetes mellitus without complication, without long-term current use of insulin ([place]) 02/12/2015  . Type 2 diabetes mellitus with stage 3 chronic kidney disease ([place]) 06/19/2014  . Essential hypertension 03/08/2014  . Parkinson's disease ([place]) 01/16/2014  . Fatigue 11/09/2013  . Diabetes mellitus, type II ([place]) 09/15/2013  . Difficulty walking 09/15/2013  . Dizziness 09/15/2013    Past Surgical History:  Procedure Laterality Date  . ABDOMINAL HYSTERECTOMY    . CERUMEN REMOVAL Bilateral 09/15/2019   Procedure: CERUMEN REMOVAL;  Surgeon: Beverly Gust, MD;  Location: [place];  Service: ENT;  Laterality: Bilateral;  Diabetic - insulin  . CHOLECYSTECTOMY    .  COLONOSCOPY WITH PROPOFOL N/A 03/29/2015   Procedure: COLONOSCOPY WITH PROPOFOL;  Surgeon: Hulen Luster, MD;  Location: [facility] ENDOSCOPY;  Service: Gastroenterology;  Laterality: N/A;  . ESOPHAGOGASTRODUODENOSCOPY (EGD) WITH PROPOFOL N/A 03/29/2015   Procedure:  ESOPHAGOGASTRODUODENOSCOPY (EGD) WITH PROPOFOL;  Surgeon: Hulen Luster, MD;  Location: [facility] ENDOSCOPY;  Service: Gastroenterology;  Laterality: N/A;  . HIP PINNING,CANNULATED Left 07/30/2019   Procedure: CANNULATED HIP PINNING;  Surgeon: Corky Mull, MD;  Location: [facility] ORS;  Service: Orthopedics;  Laterality: Left;  . lung collapse     broken rib from fall punctured lung    Prior to Admission medications   Medication Sig Start Date End Date Taking? Authorizing Provider  Calcium-Vitamin D-Vitamin K [phone] MG-UNT-MCG CHEW Chew by mouth.    [provider]  carbidopa-levodopa (SINEMET IR) 25-100 MG tablet Take by mouth See admin instructions. Take 1 tablet by mouth at 10 am, take 1/2 tablet by mouth at 1 pm, take 1/2 tablet at 4 pm, take 1/2 tablet at 7 pm. 07/07/16   [provider]  Carbidopa-Levodopa ER (SINEMET CR) 25-100 MG tablet controlled release Take 1 tablet by mouth at bedtime. 04/09/19   [provider]  cephALEXin (KEFLEX) 500 MG capsule Take 1 capsule (500 mg total) by mouth 4 (four) times daily for 4 days. 04/25/20 04/29/20  Vladimir Crofts, MD  cholecalciferol (VITAMIN D3) 25 MCG (1000 UNIT) tablet Take 500 Units by mouth daily.    [provider]  donepezil (ARICEPT) 10 MG tablet Take 10 mg by mouth at bedtime.    [provider]  fluticasone (FLONASE) 50 MCG/ACT nasal spray Place 1 spray into the nose daily.     [provider]  HYDROcodone-acetaminophen (NORCO/VICODIN) 5-325 MG tablet Take 1 tablet by mouth every 6 (six) hours as needed for severe pain. Patient not taking: Reported on 01/08/2020 08/01/19   Lattie Corns, PA-C  insulin aspart (NOVOLOG FLEXPEN) 100 UNIT/ML FlexPen Inject into the skin as needed for high blood sugar.    [provider]  Insulin Glargine (BASAGLAR KWIKPEN) 100 UNIT/ML SOPN Inject 16 Units into the skin daily.     [provider]  levothyroxine (SYNTHROID, LEVOTHROID) 50 MCG  tablet Take 25 mcg by mouth daily before breakfast.     [provider]  lidocaine (LIDODERM) 5 % Place 1 patch onto the skin every 12 (twelve) hours. Remove & Discard patch within 12 hours or as directed by MD 01/29/20 01/28/21  Nance Pear, MD  melatonin 5 MG TABS Take 5 mg by mouth at bedtime.    [provider]  memantine (NAMENDA) 10 MG tablet Take 10 mg by mouth 2 (two) times daily.  04/13/17 09/11/19  [provider]  Multiple Vitamin (MULTIVITAMIN WITH MINERALS) TABS tablet Take 1 tablet by mouth daily.    [provider]  omeprazole (PRILOSEC) 40 MG capsule Take 1 capsule (40 mg total) by mouth daily. 01/12/20   Lavera Guise, MD  rasagiline (AZILECT) 1 MG TABS tablet Take 1 mg by mouth daily.    [provider]  SYMBICORT 80-4.5 MCG/ACT inhaler TAKE 2 PUFFS BY MOUTH TWICE A DAY 08/31/19   Lavera Guise, MD    Allergies Patient has no known allergies.  Family History  Problem Relation Age of Onset  . Bladder Cancer Neg Hx   . Kidney cancer Neg Hx     Social History Social History   Tobacco Use  . Smoking status: Never Smoker  .  Smokeless tobacco: Never Used  Vaping Use  . Vaping Use: Never used  Substance Use Topics  . Alcohol use: Not Currently  . Drug use: No    Review of Systems  Constitutional: No fever/chills.  Positive for acting strangely Eyes: No visual changes. ENT: No sore throat. Cardiovascular: Denies chest pain. Respiratory: Denies shortness of breath. Gastrointestinal: No abdominal pain.  No nausea, no vomiting.  No diarrhea.  No constipation. Genitourinary: Positive for dysuria Musculoskeletal: Negative for back pain. Skin: Negative for rash. Neurological: Negative for headaches, focal weakness or numbness.  ____________________________________________   PHYSICAL EXAM:  VITAL SIGNS: Vitals:   04/25/20 1613  BP: (!) 182/90  Pulse: (!) 106  Resp: 16  Temp: 97.6 F (36.4 C)  SpO2: 98%      Constitutional: Alert and pleasantly disoriented to time and situation.  No acute distress. Eyes: Conjunctivae are normal. PERRL. EOMI. Head: Atraumatic. Nose: No congestion/rhinnorhea. Mouth/Throat: Mucous membranes are moist.  Oropharynx non-erythematous. Neck: No stridor. No cervical spine tenderness to palpation. Cardiovascular: Normal rate, regular rhythm. Grossly normal heart sounds.  Good peripheral circulation. Respiratory: Normal respiratory effort.  No retractions. Lungs CTAB. Gastrointestinal: Soft , nondistended,. No CVA tenderness.  Minimal suprapubic tenderness without peritoneal features.  Abdomen is otherwise benign Musculoskeletal: No lower extremity tenderness nor edema.  No joint effusions. No signs of acute trauma. Neurologic:  Normal speech and language. No gross focal neurologic deficits are appreciated.  Skin:  Skin is warm, dry and intact. No rash noted. Psychiatric: Mood and affect are normal. Speech and behavior are normal. ____________________________________________   LABS (all labs ordered are listed, but only abnormal results are displayed)  Labs Reviewed  BASIC METABOLIC PANEL - Abnormal; Notable for the following components:      Result Value   Glucose, Bld 47 (*)    BUN 25 (*)    GFR, Estimated 56 (*)    All other components within normal limits  URINALYSIS, COMPLETE (UACMP) WITH MICROSCOPIC - Abnormal; Notable for the following components:   Color, Urine AMBER (*)    APPearance CLOUDY (*)    Glucose, UA 50 (*)    Hgb urine dipstick SMALL (*)    Ketones, ur 5 (*)    Protein, ur 100 (*)    Leukocytes,Ua TRACE (*)    Bacteria, UA RARE (*)    All other components within normal limits  CBG MONITORING, ED - Abnormal; Notable for the following components:   Glucose-Capillary 38 (*)    All other components within normal limits  CBG MONITORING, ED - Abnormal; Notable for the following components:   Glucose-Capillary 62 (*)    All other components  within normal limits  CBG MONITORING, ED - Abnormal; Notable for the following components:   Glucose-Capillary 113 (*)    All other components within normal limits  URINE CULTURE  CBC WITH DIFFERENTIAL/PLATELET  CBG MONITORING, ED   ___________________________________________  RADIOLOGY  ED MD interpretation: CT head reviewed by me without evidence of acute intracranial pathology.  Official radiology report(s): CT Head Wo Contrast  Result Date: 04/25/2020 CLINICAL DATA:  84 year old female with altered mental status. EXAM: CT HEAD WITHOUT CONTRAST TECHNIQUE: Contiguous axial images were obtained from the base of the skull through the vertex without intravenous contrast. COMPARISON:  Head CT dated 01/29/2020. FINDINGS: Brain: Mild age-related atrophy and chronic microvascular ischemic changes. There is no acute intracranial hemorrhage. No mass effect midline shift no extra-axial fluid collection. Vascular: No hyperdense vessel or unexpected calcification. Skull: Normal.  Negative for fracture or focal lesion. Sinuses/Orbits: Chronic partial opacification of the left maxillary sinus. The remainder of the visualized paranasal sinuses and mastoid air cells are clear. Other: None IMPRESSION: 1. No acute intracranial pathology. 2. Mild age-related atrophy and chronic microvascular ischemic changes. Electronically Signed   By: Anner Crete M.D.   On: 04/25/2020 18:10    ____________________________________________   PROCEDURES and INTERVENTIONS  Procedure(s) performed (including Critical Care):  .1-3 Lead EKG Interpretation Performed by: Vladimir Crofts, MD Authorized by: Vladimir Crofts, MD     Interpretation: normal     ECG rate:  96   ECG rate assessment: normal     Rhythm: sinus rhythm     Ectopy: none     Conduction: normal    .Critical Care Performed by: Vladimir Crofts, MD Authorized by: Vladimir Crofts, MD   Critical care provider statement:    Critical care time (minutes):  45    Critical care was necessary to treat or prevent imminent or life-threatening deterioration of the following conditions:  Endocrine crisis and metabolic crisis   Critical care was time spent personally by me on the following activities:  Discussions with consultants, evaluation of patient's response to treatment, examination of patient, ordering and performing treatments and interventions, ordering and review of laboratory studies, ordering and review of radiographic studies, pulse oximetry, re-evaluation of patient's condition, obtaining history from patient or surrogate and review of old charts    Medications  dextrose 50 % solution 50 mL (50 mLs Intravenous Given 04/25/20 1717)  cephALEXin (KEFLEX) capsule 500 mg (500 mg Oral Given 04/25/20 1911)    ____________________________________________   MDM / ED COURSE   84 year old woman on long-acting insulin presents to the ED with altered mentation, likely due to hypoglycemia in the setting of poor p.o. intake and a UTI, ultimately amenable to outpatient management.  Tachycardic on presentation that self resolves, otherwise hemodynamically stable and not hypoxic.  Exam with a nonfocal neuro exam without distress, signs of trauma or any neurovascular deficits.  She does have some mild suprapubic tenderness without peritoneal features.  With her symptoms of "occasional" dysuria, she likely has acute cystitis based off of her UA.  This was sent for culture and she was empirically started on a short course of Keflex to treat this.  Due to her presenting hypoglycemia, she received a single amp of D50 with improving blood glucose thereafter.  She tolerates p.o. intake and multiple rechecks demonstrates normoglycemia.  She was observed for multiple hours and continues to be at her behavioral baseline, per the daughter at the bedside.  Her symptoms are likely due to hypoglycemia at home, likely due to poor p.o. intake at baseline exacerbated by UTI.  Return  precautions for the ED were discussed and patient is medically stable for discharge home.     ____________________________________________   FINAL CLINICAL IMPRESSION(S) / ED DIAGNOSES  Final diagnoses:  Hypoglycemia  Confusion  Lower urinary tract infectious disease     ED Discharge Orders         Ordered    cephALEXin (KEFLEX) 500 MG capsule  4 times daily        04/25/20 2053           Jacqueline [name]   Note:  This document was prepared using Dragon voice recognition software and may include unintentional dictation errors.   Vladimir Crofts, MD 04/25/20 2149    Vladimir Crofts, MD 05/13/20 0730

## 2020-04-25 NOTE — ED Notes (Signed)
Patient given peanut butter and crackers with ginger ale

## 2020-04-25 NOTE — Discharge Instructions (Signed)
You were seen in the ED because of Vicki's confusion.  This is likely because of her blood sugar levels were quite low.   Her urine had some signs of UTI, so she is being discharged with a 4-day course of Keflex antibiotics.  Please take this medication 4 times daily for the next 4 days and finish all 16 pills.  Continue to encourage her eating and drinking.   If you develop any worsening symptoms, please return to the ED.

## 2020-04-25 NOTE — ED Notes (Signed)
Patient given more peanut butter and crackers.  Daughter at bedside

## 2020-04-25 NOTE — ED Triage Notes (Signed)
Ems called out for siezures--felt it was really patient's parkinsons.  fsbs was 57.  Orange juice given to patinet on arrival.  She is alert and oriented.  Patient says her caretaker could not get her to wake up and that is why they called ems.

## 2020-04-26 LAB — URINE CULTURE: Culture: 80000 — AB

## 2020-04-29 ENCOUNTER — Other Ambulatory Visit: Payer: Self-pay

## 2020-04-29 ENCOUNTER — Other Ambulatory Visit: Payer: Self-pay | Admitting: Internal Medicine

## 2020-04-29 DIAGNOSIS — G20A1 Parkinson's disease without dyskinesia, without mention of fluctuations: Secondary | ICD-10-CM

## 2020-04-29 DIAGNOSIS — E119 Type 2 diabetes mellitus without complications: Secondary | ICD-10-CM

## 2020-04-29 DIAGNOSIS — Z794 Long term (current) use of insulin: Secondary | ICD-10-CM

## 2020-04-29 DIAGNOSIS — R300 Dysuria: Secondary | ICD-10-CM

## 2020-04-29 DIAGNOSIS — L89152 Pressure ulcer of sacral region, stage 2: Secondary | ICD-10-CM

## 2020-04-29 DIAGNOSIS — Z5320 Procedure and treatment not carried out because of patient's decision for unspecified reasons: Secondary | ICD-10-CM

## 2020-04-29 DIAGNOSIS — Z23 Encounter for immunization: Secondary | ICD-10-CM

## 2020-04-29 DIAGNOSIS — Z0001 Encounter for general adult medical examination with abnormal findings: Secondary | ICD-10-CM

## 2020-04-29 DIAGNOSIS — E782 Mixed hyperlipidemia: Secondary | ICD-10-CM

## 2020-04-29 DIAGNOSIS — G20 Parkinson's disease: Secondary | ICD-10-CM

## 2020-04-30 ENCOUNTER — Other Ambulatory Visit: Payer: Self-pay

## 2020-04-30 DIAGNOSIS — E118 Type 2 diabetes mellitus with unspecified complications: Secondary | ICD-10-CM | POA: Diagnosis not present

## 2020-04-30 DIAGNOSIS — G20 Parkinson's disease: Secondary | ICD-10-CM | POA: Diagnosis not present

## 2020-04-30 DIAGNOSIS — R296 Repeated falls: Secondary | ICD-10-CM | POA: Diagnosis not present

## 2020-04-30 DIAGNOSIS — Z23 Encounter for immunization: Secondary | ICD-10-CM | POA: Diagnosis not present

## 2020-04-30 DIAGNOSIS — G478 Other sleep disorders: Secondary | ICD-10-CM | POA: Diagnosis not present

## 2020-04-30 DIAGNOSIS — R413 Other amnesia: Secondary | ICD-10-CM | POA: Diagnosis not present

## 2020-04-30 DIAGNOSIS — Z8679 Personal history of other diseases of the circulatory system: Secondary | ICD-10-CM | POA: Diagnosis not present

## 2020-04-30 DIAGNOSIS — G249 Dystonia, unspecified: Secondary | ICD-10-CM | POA: Diagnosis not present

## 2020-04-30 DIAGNOSIS — R42 Dizziness and giddiness: Secondary | ICD-10-CM | POA: Diagnosis not present

## 2020-04-30 DIAGNOSIS — G309 Alzheimer's disease, unspecified: Secondary | ICD-10-CM | POA: Diagnosis not present

## 2020-04-30 MED ORDER — ACCU-CHEK FASTCLIX LANCETS MISC: 2 refills | Status: AC

## 2020-05-01 ENCOUNTER — Telehealth: Payer: Self-pay

## 2020-05-01 NOTE — Telephone Encounter (Signed)
Spoke with Daughter yesterday to clarify the type of lancet pt needs, she said she would call her mom and call back. LMOM today with daughter in regards to the lancets again.

## 2020-05-09 ENCOUNTER — Other Ambulatory Visit: Payer: Self-pay

## 2020-05-09 ENCOUNTER — Ambulatory Visit (INDEPENDENT_AMBULATORY_CARE_PROVIDER_SITE_OTHER): Payer: Medicare HMO | Admitting: Internal Medicine

## 2020-05-09 VITALS — HR 75 | Temp 97.2°F | Resp 16 | Ht 62.0 in | Wt 94.0 lb

## 2020-05-09 DIAGNOSIS — N3000 Acute cystitis without hematuria: Secondary | ICD-10-CM | POA: Diagnosis not present

## 2020-05-09 DIAGNOSIS — R300 Dysuria: Secondary | ICD-10-CM

## 2020-05-09 DIAGNOSIS — G259 Extrapyramidal and movement disorder, unspecified: Secondary | ICD-10-CM

## 2020-05-09 DIAGNOSIS — Z794 Long term (current) use of insulin: Secondary | ICD-10-CM

## 2020-05-09 DIAGNOSIS — E119 Type 2 diabetes mellitus without complications: Secondary | ICD-10-CM | POA: Diagnosis not present

## 2020-05-09 DIAGNOSIS — Z23 Encounter for immunization: Secondary | ICD-10-CM

## 2020-05-09 LAB — POCT GLYCOSYLATED HEMOGLOBIN (HGB A1C): Hemoglobin A1C: 7.9 % — AB (ref 4.0–5.6)

## 2020-05-09 NOTE — Progress Notes (Signed)
[facility] [place], [place] [address]  Internal MEDICINE  Office Visit Note  Patient Name: Jacqueline [name]  [id number]  [id number]  Date of Service: 05/13/2020  Chief Complaint  Patient presents with  . Hospitalization Follow-up    UTI, increased frequesncy of zoning out, wiggling and thrashing before she goes out, eyes are open but she isnt there, normally happens in AM, no muscle control or movement when this happens, lasts for about a minute, sugar sometimes drops to 43 and pt will have seizure, 2 days ago pt was unable to stay awake  . Asthma  . Diabetes  . Gastroesophageal Reflux  . Hypertension  . Hyperlipidemia  . policy update form    received  . Quality Metric Gaps    PNA, eye exam    HPI  Pt is here with her daughter for hospital follow up  She had a UTI and was treated for it. Pt is uncomfortable with her movement disorder, Has PD on tehrapy by neurology, episodes of hypoglycemia as well   Current Medication: Outpatient Encounter Medications as of 05/09/2020  Medication Sig Note  . Accu-Chek FastClix Lancets MISC Once a day   . atorvastatin (LIPITOR) 10 MG tablet TAKE A HALF TABLET BY MOUTH EVERY DAY   . Calcium-Vitamin D-Vitamin K [id number] MG-UNT-MCG CHEW Chew by mouth.   . carbidopa-levodopa (SINEMET IR) 25-100 MG tablet Take by mouth See admin instructions. Take 1 tablet by mouth at 10 am, take 1/2 tablet by mouth at 1 pm, take 1/2 tablet at 4 pm, take 1/2 tablet at 7 pm. 07/29/2019: Takes with sinemet cr Daughter states 1,1/2,1/2/,1/2 directions at pharmacy is 1,1/2,1/2,1/2,1/2  . Carbidopa-Levodopa ER (SINEMET CR) 25-100 MG tablet controlled release Take 1 tablet by mouth at bedtime.   . cholecalciferol (VITAMIN D3) 25 MCG (1000 UNIT) tablet Take 500 Units by mouth daily.   Marland Kitchen donepezil (ARICEPT) 10 MG tablet Take 10 mg by mouth at bedtime.   . fluticasone (FLONASE) 50 MCG/ACT nasal spray Place 1 spray into the nose daily.    Marland Kitchen  HYDROcodone-acetaminophen (NORCO/VICODIN) 5-325 MG tablet Take 1 tablet by mouth every 6 (six) hours as needed for severe pain. (Patient not taking: Reported on 01/08/2020)   . insulin aspart (NOVOLOG FLEXPEN) 100 UNIT/ML FlexPen Inject into the skin as needed for high blood sugar.   . Insulin Glargine (BASAGLAR KWIKPEN) 100 UNIT/ML SOPN Inject 16 Units into the skin daily.    Marland Kitchen levothyroxine (SYNTHROID, LEVOTHROID) 50 MCG tablet Take 25 mcg by mouth daily before breakfast.    . lidocaine (LIDODERM) 5 % Place 1 patch onto the skin every 12 (twelve) hours. Remove & Discard patch within 12 hours or as directed by MD   . melatonin 5 MG TABS Take 5 mg by mouth at bedtime.   . memantine (NAMENDA) 10 MG tablet Take 10 mg by mouth 2 (two) times daily.    . Multiple Vitamin (MULTIVITAMIN WITH MINERALS) TABS tablet Take 1 tablet by mouth daily.   Marland Kitchen omeprazole (PRILOSEC) 40 MG capsule Take 1 capsule (40 mg total) by mouth daily.   . rasagiline (AZILECT) 1 MG TABS tablet Take 1 mg by mouth daily.   . SYMBICORT 80-4.5 MCG/ACT inhaler TAKE 2 PUFFS BY MOUTH TWICE A DAY    No facility-administered encounter medications on file as of 05/09/2020.    Surgical History: Past Surgical History:  Procedure Laterality Date  . ABDOMINAL HYSTERECTOMY    . CERUMEN REMOVAL Bilateral 09/15/2019  Procedure: CERUMEN REMOVAL;  Surgeon: Beverly Gust, MD;  Location: [place];  Service: ENT;  Laterality: Bilateral;  Diabetic - insulin  . CHOLECYSTECTOMY    . COLONOSCOPY WITH PROPOFOL N/A 03/29/2015   Procedure: COLONOSCOPY WITH PROPOFOL;  Surgeon: Hulen Luster, MD;  Location: [facility] ENDOSCOPY;  Service: Gastroenterology;  Laterality: N/A;  . ESOPHAGOGASTRODUODENOSCOPY (EGD) WITH PROPOFOL N/A 03/29/2015   Procedure: ESOPHAGOGASTRODUODENOSCOPY (EGD) WITH PROPOFOL;  Surgeon: Hulen Luster, MD;  Location[facility] ENDOSCOPY;  Service: Gastroenterology;  Laterality: N/A;  . HIP PINNING,CANNULATED Left 07/30/2019   Procedure:  CANNULATED HIP PINNING;  Surgeon: Corky Mull, MD;  Location: [facility] ORS;  Service: Orthopedics;  Laterality: Left;  . lung collapse     broken rib from fall punctured lung    Medical History: Past Medical History:  Diagnosis Date  . Asthma   . Collapsed lung   . Dementia ([place])   . Diabetes mellitus without complication ([place])    Patient takes Insulin twice a day.   Marland Kitchen GERD (gastroesophageal reflux disease)   . Hyperlipidemia   . Hypertension   . Hypothyroidism   . Parkinson's disease ([place])   . Reactive airway disease     Family History: Family History  Problem Relation Age of Onset  . Bladder Cancer Neg Hx   . Kidney cancer Neg Hx     Social History   Socioeconomic History  . Marital status: Widowed    Spouse name: Not on file  . Number of children: Not on file  . Years of education: Not on file  . Highest education level: Not on file  Occupational History  . Not on file  Tobacco Use  . Smoking status: Never Smoker  . Smokeless tobacco: Never Used  Vaping Use  . Vaping Use: Never used  Substance and Sexual Activity  . Alcohol use: Not Currently  . Drug use: No  . Sexual activity: Not on file  Other Topics Concern  . Not on file  Social History Narrative  . Not on file   Social Determinants of Health   Financial Resource Strain:   . Difficulty of Paying Living Expenses: Not on file  Food Insecurity:   . Worried About Charity fundraiser in the Last Year: Not on file  . Ran Out of Food in the Last Year: Not on file  Transportation Needs:   . Lack of Transportation (Medical): Not on file  . Lack of Transportation (Non-Medical): Not on file  Physical Activity:   . Days of Exercise per Week: Not on file  . Minutes of Exercise per Session: Not on file  Stress:   . Feeling of Stress : Not on file  Social Connections:   . Frequency of Communication with Friends and Family: Not on file  . Frequency of Social Gatherings with Friends and Family: Not on file   . Attends Religious Services: Not on file  . Active Member of Clubs or Organizations: Not on file  . Attends Archivist Meetings: Not on file  . Marital Status: Not on file  Intimate Partner Violence:   . Fear of Current or Ex-Partner: Not on file  . Emotionally Abused: Not on file  . Physically Abused: Not on file  . Sexually Abused: Not on file      Review of Systems  Constitutional: Negative for chills, diaphoresis and fatigue.  HENT: Negative for ear pain, postnasal drip and sinus pressure.   Eyes: Negative for photophobia, discharge, redness, itching  and visual disturbance.  Respiratory: Negative for cough, shortness of breath and wheezing.   Cardiovascular: Negative for chest pain, palpitations and leg swelling.  Gastrointestinal: Negative for abdominal pain, constipation, diarrhea, nausea and vomiting.  Genitourinary: Negative for dysuria and flank pain.  Musculoskeletal: Negative for arthralgias, back pain, gait problem and neck pain.  Skin: Negative for color change.  Allergic/Immunologic: Negative for environmental allergies and food allergies.  Neurological: Positive for tremors. Negative for dizziness and headaches.       Excessive spontaneous movements of her torso   Hematological: Does not bruise/bleed easily.  Psychiatric/Behavioral: Negative for agitation, behavioral problems (depression) and hallucinations.    Vital Signs: Pulse 75   Temp (!) 97.2 F (36.2 C)   Resp 16   Ht 5\' 2"  (1.575 m)   Wt 94 lb (42.6 kg)   SpO2 96%   BMI 17.19 kg/m    Physical Exam Constitutional:      General: She is not in acute distress.    Appearance: She is well-developed. She is not diaphoretic.  HENT:     Head: Normocephalic and atraumatic.     Mouth/Throat:     Pharynx: No oropharyngeal exudate.  Eyes:     Pupils: Pupils are equal, round, and reactive to light.  Neck:     Thyroid: No thyromegaly.     Vascular: No JVD.     Trachea: No tracheal deviation.   Cardiovascular:     Rate and Rhythm: Normal rate and regular rhythm.     Heart sounds: Normal heart sounds. No murmur heard.  No friction rub. No gallop.   Pulmonary:     Effort: Pulmonary effort is normal. No respiratory distress.     Breath sounds: No wheezing or rales.  Chest:     Chest wall: No tenderness.  Abdominal:     General: Bowel sounds are normal.     Palpations: Abdomen is soft.  Musculoskeletal:        General: Normal range of motion.     Cervical back: Normal range of motion and neck supple.  Lymphadenopathy:     Cervical: No cervical adenopathy.  Skin:    General: Skin is warm and dry.  Neurological:     Mental Status: She is alert and oriented to person, place, and time.     Cranial Nerves: No cranial nerve deficit.     Coordination: Coordination abnormal.     Gait: Gait abnormal.     Deep Tendon Reflexes: Reflexes abnormal.  Psychiatric:        Behavior: Behavior normal.        Thought Content: Thought content normal.        Judgment: Judgment normal.        Assessment/Plan: 1. Extrapyramidal movement disorder Per Neurology, excessive movement, pt looks very uncomfortable, need to have modification in her therapy   2. Type 2 diabetes mellitus without complication, with long-term current use of insulin (HCC) - POCT HgB A1C. Per endocrinology treatment   3. Flu vaccine need - Flu Vaccine MDCK QUAD PF  4. Acute cystitis without hematuria Finished antibiotics, was unable to give specimen   General Counseling: Bracha verbalizes understanding of the findings of todays visit and agrees with plan of treatment. I have discussed any further diagnostic evaluation that may be needed or ordered today. We also reviewed her medications today. she has been encouraged to call the office with any questions or concerns that should arise related to todays visit.    Orders  Placed This Encounter  Procedures  . Flu Vaccine MDCK QUAD PF  . POCT HgB A1C    No  orders of the defined types were placed in this encounter.   Total time spent 30 Minutes Time spent includes review of chart, medications, test results, and follow up plan with the patient.      Dr Lavera Guise Internal medicine

## 2020-05-13 ENCOUNTER — Encounter: Payer: Self-pay | Admitting: Internal Medicine

## 2020-05-15 DIAGNOSIS — E119 Type 2 diabetes mellitus without complications: Secondary | ICD-10-CM | POA: Diagnosis not present

## 2020-05-15 DIAGNOSIS — H04129 Dry eye syndrome of unspecified lacrimal gland: Secondary | ICD-10-CM | POA: Diagnosis not present

## 2020-05-15 DIAGNOSIS — H26493 Other secondary cataract, bilateral: Secondary | ICD-10-CM | POA: Diagnosis not present

## 2020-05-15 DIAGNOSIS — H1045 Other chronic allergic conjunctivitis: Secondary | ICD-10-CM | POA: Diagnosis not present

## 2020-05-23 DIAGNOSIS — R69 Illness, unspecified: Secondary | ICD-10-CM | POA: Diagnosis not present

## 2020-06-05 NOTE — Telephone Encounter (Signed)
Please see MyChart message.

## 2020-06-05 NOTE — Telephone Encounter (Signed)
Check on this

## 2020-06-21 ENCOUNTER — Ambulatory Visit: Payer: Medicare HMO | Admitting: Internal Medicine

## 2020-07-11 ENCOUNTER — Ambulatory Visit: Payer: Medicare HMO | Admitting: Physician Assistant

## 2020-07-15 ENCOUNTER — Other Ambulatory Visit: Payer: Self-pay

## 2020-07-15 ENCOUNTER — Telehealth: Payer: Self-pay

## 2020-07-15 DIAGNOSIS — N390 Urinary tract infection, site not specified: Secondary | ICD-10-CM

## 2020-07-15 MED ORDER — NITROFURANTOIN MONOHYD MACRO 100 MG PO CAPS
100.0000 mg | ORAL_CAPSULE | Freq: Two times a day (BID) | ORAL | 0 refills | Status: DC
Start: 2020-07-15 — End: 2020-07-25

## 2020-07-15 NOTE — Telephone Encounter (Signed)
Pt daughter called that dr Melrose Nakayama order UA and she had uti as per dr Humphrey Rolls we send macrobid and also put order to urine  culture

## 2020-07-17 ENCOUNTER — Other Ambulatory Visit: Payer: Self-pay

## 2020-07-17 ENCOUNTER — Emergency Department
Admission: EM | Admit: 2020-07-17 | Discharge: 2020-07-17 | Disposition: A | Discharge status: Home / Self Care | Payer: Medicare HMO | Attending: Emergency Medicine | Admitting: Emergency Medicine

## 2020-07-17 ENCOUNTER — Telehealth: Payer: Self-pay

## 2020-07-17 DIAGNOSIS — J45909 Unspecified asthma, uncomplicated: Secondary | ICD-10-CM | POA: Insufficient documentation

## 2020-07-17 DIAGNOSIS — I129 Hypertensive chronic kidney disease with stage 1 through stage 4 chronic kidney disease, or unspecified chronic kidney disease: Secondary | ICD-10-CM | POA: Insufficient documentation

## 2020-07-17 DIAGNOSIS — E11649 Type 2 diabetes mellitus with hypoglycemia without coma: Secondary | ICD-10-CM | POA: Insufficient documentation

## 2020-07-17 DIAGNOSIS — Z7951 Long term (current) use of inhaled steroids: Secondary | ICD-10-CM | POA: Insufficient documentation

## 2020-07-17 DIAGNOSIS — Z79899 Other long term (current) drug therapy: Secondary | ICD-10-CM | POA: Insufficient documentation

## 2020-07-17 DIAGNOSIS — E039 Hypothyroidism, unspecified: Secondary | ICD-10-CM | POA: Diagnosis not present

## 2020-07-17 DIAGNOSIS — E162 Hypoglycemia, unspecified: Secondary | ICD-10-CM

## 2020-07-17 DIAGNOSIS — E1122 Type 2 diabetes mellitus with diabetic chronic kidney disease: Secondary | ICD-10-CM | POA: Diagnosis not present

## 2020-07-17 DIAGNOSIS — R4182 Altered mental status, unspecified: Secondary | ICD-10-CM | POA: Diagnosis not present

## 2020-07-17 DIAGNOSIS — N183 Chronic kidney disease, stage 3 unspecified: Secondary | ICD-10-CM | POA: Insufficient documentation

## 2020-07-17 DIAGNOSIS — G20 Parkinson's disease: Secondary | ICD-10-CM | POA: Diagnosis not present

## 2020-07-17 DIAGNOSIS — T50905A Adverse effect of unspecified drugs, medicaments and biological substances, initial encounter: Secondary | ICD-10-CM | POA: Diagnosis not present

## 2020-07-17 DIAGNOSIS — F0280 Dementia in other diseases classified elsewhere without behavioral disturbance: Secondary | ICD-10-CM | POA: Insufficient documentation

## 2020-07-17 DIAGNOSIS — Z794 Long term (current) use of insulin: Secondary | ICD-10-CM | POA: Diagnosis not present

## 2020-07-17 LAB — CBG MONITORING, ED
Glucose-Capillary: 59 mg/dL — ABNORMAL LOW (ref 70–99)
Glucose-Capillary: 77 mg/dL (ref 70–99)
Glucose-Capillary: 107 mg/dL — ABNORMAL HIGH (ref 70–99)
Glucose-Capillary: 112 mg/dL — ABNORMAL HIGH (ref 70–99)
Glucose-Capillary: 137 mg/dL — ABNORMAL HIGH (ref 70–99)

## 2020-07-17 LAB — CBC WITH DIFFERENTIAL/PLATELET
Abs Immature Granulocytes: 0.04 10*3/uL (ref 0.00–0.07)
Basophils Absolute: 0 10*3/uL (ref 0.0–0.1)
Basophils Relative: 1 %
Eosinophils Absolute: 0 10*3/uL (ref 0.0–0.5)
Eosinophils Relative: 0 %
HCT: 41.8 % (ref 36.0–46.0)
Hemoglobin: 14.5 g/dL (ref 12.0–15.0)
Immature Granulocytes: 1 %
Lymphocytes Relative: 6 %
Lymphs Abs: 0.4 10*3/uL — ABNORMAL LOW (ref 0.7–4.0)
MCH: 30.7 pg (ref 26.0–34.0)
MCHC: 34.7 g/dL (ref 30.0–36.0)
MCV: 88.4 fL (ref 80.0–100.0)
Monocytes Absolute: 0.6 10*3/uL (ref 0.1–1.0)
Monocytes Relative: 9 %
Neutro Abs: 6.3 10*3/uL (ref 1.7–7.7)
Neutrophils Relative %: 83 %
Platelets: 200 10*3/uL (ref 150–400)
RBC: 4.73 MIL/uL (ref 3.87–5.11)
RDW: 12.3 % (ref 11.5–15.5)
WBC: 7.4 10*3/uL (ref 4.0–10.5)
nRBC: 0 % (ref 0.0–0.2)

## 2020-07-17 LAB — URINALYSIS, COMPLETE (UACMP) WITH MICROSCOPIC
Bilirubin Urine: NEGATIVE
Glucose, UA: NEGATIVE mg/dL
Ketones, ur: 5 mg/dL — AB
Leukocytes,Ua: NEGATIVE
Nitrite: NEGATIVE
Protein, ur: 100 mg/dL — AB
Specific Gravity, Urine: 1.016 (ref 1.005–1.030)
Squamous Epithelial / HPF: NONE SEEN (ref 0–5)
pH: 5 (ref 5.0–8.0)

## 2020-07-17 LAB — COMPREHENSIVE METABOLIC PANEL
ALT: <5 U/L (ref 0–44)
AST: 22 U/L (ref 15–41)
Albumin: 3.8 g/dL (ref 3.5–5.0)
Alkaline Phosphatase: 81 U/L (ref 38–126)
Anion gap: 17 — ABNORMAL HIGH (ref 5–15)
BUN: 25 mg/dL — ABNORMAL HIGH (ref 8–23)
CO2: 23 mmol/L (ref 22–32)
Calcium: 9.6 mg/dL (ref 8.9–10.3)
Chloride: 99 mmol/L (ref 98–111)
Creatinine, Ser: 1.27 mg/dL — ABNORMAL HIGH (ref 0.44–1.00)
GFR, Estimated: 42 mL/min — ABNORMAL LOW (ref >60)
Glucose, Bld: 71 mg/dL (ref 70–99)
Potassium: 3 mmol/L — ABNORMAL LOW (ref 3.5–5.1)
Sodium: 139 mmol/L (ref 135–145)
Total Bilirubin: 0.9 mg/dL (ref 0.3–1.2)
Total Protein: 6.5 g/dL (ref 6.5–8.1)

## 2020-07-17 MED ORDER — CARBIDOPA-LEVODOPA 25-100 MG PO TABS
1.0000 | ORAL_TABLET | Freq: Once | ORAL | Status: AC
  Administered 2020-07-17: 1 via ORAL
  Filled 2020-07-17: qty 1

## 2020-07-17 MED ORDER — DEXTROSE IN LACTATED RINGERS 5 % IV SOLN: INTRAVENOUS | Status: DC

## 2020-07-17 MED ORDER — KCL-LACTATED RINGERS-D5W 20 MEQ/L IV SOLN
INTRAVENOUS | Status: DC
Start: 2020-07-17 — End: 2020-07-17

## 2020-07-17 NOTE — ED Notes (Signed)
This RN notified Tamala Julian, MD about pt high blood pressure.

## 2020-07-17 NOTE — ED Triage Notes (Signed)
Pt presents to the [facility] via EMS from home with c/o altered mental status and hypoglycemia. EMS states that family called after pt became confused and lethargic. Blood sugar 40 with EMS at residence. Pt received 25G D10 en route. Pt's blood sugar went up to 357 then dropped to 127. FSBS 77 upon arrival to [facility]. Pt is being treated for UTI and currently on day 2 of antibiotics.

## 2020-07-17 NOTE — ED Provider Notes (Signed)
[facility] Emergency Department Provider Note ____________________________________________   Event Date/Time   First MD Initiated Contact with Patient 07/17/20 1502     (approximate)  I have reviewed the triage vital signs and the nursing notes.  HISTORY  Chief Complaint Altered Mental Status and Hypoglycemia   HPI Jacqueline [name] is a 85 y.o. femalewho presents to the ED for evaluation of altered mentation and hypoglycemia.  Chart review indicates history of DM on insulin, dementia, HTN, HLD and Parkinson's disease. Patient was started on outpatient antibiotics 2 days ago for possible UTI. Daughter called PCP today due to concern for unresponsiveness and was advised to call 911.  Patient is unable to provide any relevant history due to her dementia and hypoglycemia.  Additional information is provided by daughter, who arrives later, indicating that patient's caregiver accidentally provided her with 16 units of NovoLog short acting insulin instead of her standard insulin glargine.  They report this was an old medicine in the refrigerator that she is no longer prescribed, and that medication has since been thrown out.  Daughter reports that they have been compliant with antibiotics and daughter has not noticed any fevers, emesis with the patient.  Past Medical History:  Diagnosis Date  . Asthma   . Collapsed lung   . Dementia ([place])   . Diabetes mellitus without complication ([place])    Patient takes Insulin twice a day.   Marland Kitchen GERD (gastroesophageal reflux disease)   . Hyperlipidemia   . Hypertension   . Hypothyroidism   . Parkinson's disease ([place])   . Reactive airway disease     Patient Active Problem List   Diagnosis Date Noted  . Goals of care, counseling/discussion   . Palliative care by specialist   . Pressure injury of skin 07/30/2019  . Closed left hip fracture ([place]) 07/29/2019  . Fall at home, initial encounter 07/29/2019  . Acquired  hypothyroidism 07/29/2019  . Dementia due to Parkinson's disease without behavioral disturbance ([place]) 07/29/2019  . Reactive airway disease that is not asthma 07/29/2019  . Frequent falls 07/29/2019  . Pressure injury of skin of sacral region 06/12/2019  . Wound healing, delayed 06/12/2019  . Encounter for general adult medical examination with abnormal findings 06/21/2018  . Uncontrolled type 2 diabetes mellitus with hyperglycemia ([place]) 06/21/2018  . Asthma without status asthmaticus 07/08/2016  . Hyperlipidemia, unspecified 07/08/2016  . Dysphagia 01/07/2016  . Unsteadiness 10/01/2015  . Type 2 diabetes mellitus without complication, without long-term current use of insulin ([place]) 08/07/2015  . Mixed Alzheimer's and vascular dementia ([place]) 08/06/2015  . Controlled type 2 diabetes mellitus without complication, without long-term current use of insulin ([place]) 02/12/2015  . Type 2 diabetes mellitus with stage 3 chronic kidney disease ([place]) 06/19/2014  . Essential hypertension 03/08/2014  . Parkinson's disease ([place]) 01/16/2014  . Fatigue 11/09/2013  . Diabetes mellitus, type II ([place]) 09/15/2013  . Difficulty walking 09/15/2013  . Dizziness 09/15/2013    Past Surgical History:  Procedure Laterality Date  . ABDOMINAL HYSTERECTOMY    . CERUMEN REMOVAL Bilateral 09/15/2019   Procedure: CERUMEN REMOVAL;  Surgeon: Beverly Gust, MD;  Location: [place];  Service: ENT;  Laterality: Bilateral;  Diabetic - insulin  . CHOLECYSTECTOMY    . COLONOSCOPY WITH PROPOFOL N/A 03/29/2015   Procedure: COLONOSCOPY WITH PROPOFOL;  Surgeon: Hulen Luster, MD;  Location: [facility] ENDOSCOPY;  Service: Gastroenterology;  Laterality: N/A;  . ESOPHAGOGASTRODUODENOSCOPY (EGD) WITH PROPOFOL N/A 03/29/2015   Procedure: ESOPHAGOGASTRODUODENOSCOPY (EGD) WITH  PROPOFOL;  Surgeon: Hulen Luster, MD;  Location: [facility] ENDOSCOPY;  Service: Gastroenterology;  Laterality: N/A;  . HIP PINNING,CANNULATED Left 07/30/2019    Procedure: CANNULATED HIP PINNING;  Surgeon: Corky Mull, MD;  Location: [facility] ORS;  Service: Orthopedics;  Laterality: Left;  . lung collapse     broken rib from fall punctured lung    Prior to Admission medications   Medication Sig Start Date End Date Taking? Authorizing Provider  Accu-Chek FastClix Lancets MISC Once a day 04/30/20   Luiz Ochoa, NP  atorvastatin (LIPITOR) 10 MG tablet TAKE A HALF TABLET BY MOUTH EVERY DAY 04/29/20   Lavera Guise, MD  Calcium-Vitamin D-Vitamin K [phone] MG-UNT-MCG CHEW Chew by mouth.    [provider]  carbidopa-levodopa (SINEMET IR) 25-100 MG tablet Take by mouth See admin instructions. Take 1 tablet by mouth at 10 am, take 1/2 tablet by mouth at 1 pm, take 1/2 tablet at 4 pm, take 1/2 tablet at 7 pm. 07/07/16   [provider]  Carbidopa-Levodopa ER (SINEMET CR) 25-100 MG tablet controlled release Take 1 tablet by mouth at bedtime. 04/09/19   [provider]  cholecalciferol (VITAMIN D3) 25 MCG (1000 UNIT) tablet Take 500 Units by mouth daily.    [provider]  donepezil (ARICEPT) 10 MG tablet Take 10 mg by mouth at bedtime.    [provider]  fluticasone (FLONASE) 50 MCG/ACT nasal spray Place 1 spray into the nose daily.     [provider]  HYDROcodone-acetaminophen (NORCO/VICODIN) 5-325 MG tablet Take 1 tablet by mouth every 6 (six) hours as needed for severe pain. Patient not taking: Reported on 01/08/2020 08/01/19   Lattie Corns, PA-C  insulin aspart (NOVOLOG FLEXPEN) 100 UNIT/ML FlexPen Inject into the skin as needed for high blood sugar.    [provider]  Insulin Glargine (BASAGLAR KWIKPEN) 100 UNIT/ML SOPN Inject 16 Units into the skin daily.     [provider]  levothyroxine (SYNTHROID, LEVOTHROID) 50 MCG tablet Take 25 mcg by mouth daily before breakfast.     [provider]  lidocaine (LIDODERM) 5 % Place 1 patch onto the skin every 12 (twelve)  hours. Remove & Discard patch within 12 hours or as directed by MD 01/29/20 01/28/21  Nance Pear, MD  melatonin 5 MG TABS Take 5 mg by mouth at bedtime.    [provider]  memantine (NAMENDA) 10 MG tablet Take 10 mg by mouth 2 (two) times daily.  04/13/17 09/11/19  [provider]  Multiple Vitamin (MULTIVITAMIN WITH MINERALS) TABS tablet Take 1 tablet by mouth daily.    [provider]  nitrofurantoin, macrocrystal-monohydrate, (MACROBID) 100 MG capsule Take 1 capsule (100 mg total) by mouth 2 (two) times daily. 07/15/20   Lavera Guise, MD  omeprazole (PRILOSEC) 40 MG capsule Take 1 capsule (40 mg total) by mouth daily. 01/12/20   Lavera Guise, MD  rasagiline (AZILECT) 1 MG TABS tablet Take 1 mg by mouth daily.    [provider]  SYMBICORT 80-4.5 MCG/ACT inhaler TAKE 2 PUFFS BY MOUTH TWICE A DAY 08/31/19   Lavera Guise, MD    Allergies Patient has no known allergies.  Family History  Problem Relation Age of Onset  . Bladder Cancer Neg Hx   . Kidney cancer Neg Hx     Social History Social History   Tobacco Use  . Smoking status: Never Smoker  . Smokeless tobacco: Never Used  Vaping  Use  . Vaping Use: Never used  Substance Use Topics  . Alcohol use: Not Currently  . Drug use: No    Review of Systems  Unable to be accurately assessed due to patient's dementia and confusion ____________________________________________   PHYSICAL EXAM:  VITAL SIGNS: Vitals:   07/17/20 1800 07/17/20 1830  BP: (!) 201/85 (!) 181/93  Pulse: (!) 43 82  Resp: (!) 23 20  Temp:    SpO2: 100% 99%     Constitutional: Initially somnolent, resolving to alertness after resolution of hypoglycemia.  Parkinsonian tremor at baseline, per the daughter at the bedside.  Cachectic. Eyes: Conjunctivae are normal. PERRL. EOMI. Head: Atraumatic. Nose: No congestion/rhinnorhea. Mouth/Throat: Mucous membranes are dry.  Oropharynx non-erythematous. Neck: No stridor. No  cervical spine tenderness to palpation. Cardiovascular: Normal rate, regular rhythm. Grossly normal heart sounds.  Good peripheral circulation. Respiratory: Normal respiratory effort.  No retractions. Lungs CTAB. Gastrointestinal: Soft , nondistended, nontender to palpation. No CVA tenderness. Musculoskeletal: No lower extremity tenderness nor edema.  No joint effusions. No signs of acute trauma. Neurologic:   No gross focal neurologic deficits are appreciated.  Skin:  Skin is warm, dry and intact. No rash noted. Psychiatric: Mood and affect are normal. Speech and behavior are normal.  ____________________________________________   LABS (all labs ordered are listed, but only abnormal results are displayed)  Labs Reviewed  CBC WITH DIFFERENTIAL/PLATELET - Abnormal; Notable for the following components:      Result Value   Lymphs Abs 0.4 (*)    All other components within normal limits  COMPREHENSIVE METABOLIC PANEL - Abnormal; Notable for the following components:   Potassium 3.0 (*)    BUN 25 (*)    Creatinine, Ser 1.27 (*)    GFR, Estimated 42 (*)    Anion gap 17 (*)    All other components within normal limits  URINALYSIS, COMPLETE (UACMP) WITH MICROSCOPIC - Abnormal; Notable for the following components:   Color, Urine AMBER (*)    APPearance HAZY (*)    Hgb urine dipstick SMALL (*)    Ketones, ur 5 (*)    Protein, ur 100 (*)    Bacteria, UA RARE (*)    All other components within normal limits  CBG MONITORING, ED - Abnormal; Notable for the following components:   Glucose-Capillary 59 (*)    All other components within normal limits  CBG MONITORING, ED - Abnormal; Notable for the following components:   Glucose-Capillary 107 (*)    All other components within normal limits  CBG MONITORING, ED - Abnormal; Notable for the following components:   Glucose-Capillary 112 (*)    All other components within normal limits  CBG MONITORING, ED - Abnormal; Notable for the following  components:   Glucose-Capillary 137 (*)    All other components within normal limits  CBG MONITORING, ED   ____________________________________________  12 Lead EKG  Sinus rhythm, rate of 75 bpm.  Normal axis and intervals.  Septal Q waves.  No evidence of acute ischemia. ____________________________________________   PROCEDURES and INTERVENTIONS  Procedure(s) performed (including Critical Care):  .1-3 Lead EKG Interpretation Performed by: Vladimir Crofts, MD Authorized by: Vladimir Crofts, MD     Interpretation: normal     ECG rate:  80   ECG rate assessment: normal     Rhythm: sinus rhythm     Ectopy: none     Conduction: normal      Medications  carbidopa-levodopa (SINEMET IR) 25-100 MG per tablet immediate release 1  tablet (1 tablet Oral Given 07/17/20 1838)    ____________________________________________   MDM / ED COURSE   85 year old woman with history of Parkinson's disease and dementia presents to the ED with acute altered mentation, likely due to accidental iatrogenic insulin mix up, and ultimately amenable to outpatient management.  Hemodynamically stable.  Exam nonfocal without evidence of trauma, neurovascular deficits or distress.  Initially noted to be quite somnolent, and had hypoglycemic fingerstick, resolved with D5 LR infusion.  When acquiring additional history from the daughter, it sounds like patient was accidentally provided short acting NovoLog this morning instead of her insulin glargine that is normally prescribed.  This likely caused hypoglycemia and her associated symptoms.  Provided D5 LR infusion without additional hypoglycemic episodes, and discontinued after sufficient time for the NovoLog you have washed out.  Subsequently tolerating p.o. intake at baseline, per the daughter at the bedside.  Multiple CBGs after this without evidence of recurrent hypoglycemia.  I see no evidence of additional pathology to preclude outpatient management.  Return  precautions for the ED were discussed prior to discharge.  Clinical Course as of 07/17/20 2332  Wed Jul 17, 2020  1514 Blood glucose 77 on arrival to the ED, quick recheck down to 59 after just a few minutes. Will start d5LR [DS]  1623 Reassessed.  Glucose 107 on D5 LR drip.  Daughter now at the bedside and provides additional history.  She reports that patient normally gets 16 units of insulin glargine each morning, but 24/7 caregiver accidentally provided 16 units of short acting NovoLog this morning.  Since that time, patient has been increasingly confused with intermittent episodes of diaphoresis and seizure-like activity.  Daughter reports that patient has been treated for the past 2 days for a UTI.  No fevers and patient has been tolerating her medication.  We discussed the likelihood that her symptoms today are related to iatrogenic hypoglycemia.  We discussed maintaining D5 LR drip for short period of time before discontinuing and rechecking her fingerstick glucose then.  We discussed cath UA.  We discussed possibility of outpatient management, and daughter is eager for that possibility. [DS]  2542 [id number].  Repeat glucose 112 as D5 LR is being discontinued.  Discussed work-up with the daughter who remains at the bedside.  We discussed p.o. challenge and repeat CBG.  We discussed the possibility of outpatient management.  She continues to be eager for this. [DS]  1923 Tolerated p.o. intake and repeat glucose 137.  She has been off of the D5 LR drip for greater than 1 hour. [DS]    Clinical Course User Index [DS] Vladimir Crofts, MD    ____________________________________________   FINAL CLINICAL IMPRESSION(S) / ED DIAGNOSES  Final diagnoses:  Hypoglycemia  Adverse effects of medication, initial encounter     ED Discharge Orders    None       Wilna Pennie Tamala Julian   Note:  This document was prepared using Dragon voice recognition software and may include unintentional dictation  errors.   Vladimir Crofts, MD 07/17/20 [id number]

## 2020-07-17 NOTE — ED Notes (Signed)
Fed pt apple sauce and pt drank a cup of water. Going to check CBG in about 30 mins.

## 2020-07-17 NOTE — Telephone Encounter (Signed)
Pt's daughter called and advised that patient was started on antibiotics two days ago for UTI.  Daughter states that pt is somewhat unresponsive, she will wake her up and pt will go straight back out.  She also stated that pt had some color change to lips that they were a little blue, her breathing is real deep also.  I advised daughter that I felt she should call 911 but I went and got Lovena Le to speak to her and she told daughter to call 911 and advised we would call her back in a little bit to check on things. Daughter [phone]  Daughter also told me that pt's blood sugar was 591 this morning before taking her medications and at 11:50 am she advised it was down to 142.

## 2020-07-17 NOTE — Discharge Instructions (Signed)
As we discussed, I suspect the confusion and weakness today was because of the insulin mixup at home.  Please ensure you are using the Basaglar insulin only.   Continue all of her regular prescription medications and continue to urged her to eat frequent small meals.

## 2020-07-25 ENCOUNTER — Ambulatory Visit: Payer: Medicare HMO | Admitting: Hospice and Palliative Medicine

## 2020-07-25 ENCOUNTER — Encounter: Payer: Self-pay | Admitting: Hospice and Palliative Medicine

## 2020-07-25 ENCOUNTER — Other Ambulatory Visit: Payer: Self-pay

## 2020-07-25 VITALS — BP 103/64 | HR 63 | Temp 97.5°F | Ht 62.0 in | Wt 94.0 lb

## 2020-07-25 DIAGNOSIS — R300 Dysuria: Secondary | ICD-10-CM

## 2020-07-25 NOTE — Progress Notes (Unsigned)
[facility] [place], [place] [address]  Internal MEDICINE  Telephone Visit  Patient Name: Jacqueline [name]  [id number]  [id number]  Date of Service: 07/25/2020  I connected with the patient at 1025 by telephone and verified the patients identity using two identifiers.   I discussed the limitations, risks, security and privacy concerns of performing an evaluation and management service by telephone and the availability of in person appointments. I also discussed with the patient that there may be a patient responsible charge related to the service.  The patient expressed understanding and agrees to proceed.    Chief Complaint  Patient presents with  . Telephone Screen    [phone]  . Telephone Assessment  . Urinary Tract Infection    Hallucination and confused     HPI Patient is being seen today virtually for acute sick visit Daughter present on video visit Puts camera in front of Jacqueline [name] is lethargic and throughout visit she does not wake up or respond to questioning Daughter requesting her to be treated for UTI as in the past similar symptoms have been witnessed when she having urine infection Similar event happened few weeks ago and was seen in emergency department   Current Medication: Outpatient Encounter Medications as of 07/25/2020  Medication Sig Note  . Accu-Chek FastClix Lancets MISC Once a day   . atorvastatin (LIPITOR) 10 MG tablet TAKE A HALF TABLET BY MOUTH EVERY DAY   . Calcium-Vitamin D-Vitamin K [phone] MG-UNT-MCG CHEW Chew by mouth.   . carbidopa-levodopa (SINEMET IR) 25-100 MG tablet Take by mouth See admin instructions. Take 1 tablet by mouth at 10 am, take 1/2 tablet by mouth at 1 pm, take 1/2 tablet at 4 pm, take 1/2 tablet at 7 pm. 07/29/2019: Takes with sinemet cr Daughter states 1,1/2,1/2/,1/2 directions at pharmacy is 1,1/2,1/2,1/2,1/2  . Carbidopa-Levodopa ER (SINEMET CR) 25-100 MG tablet controlled release Take 1  tablet by mouth at bedtime.   . cholecalciferol (VITAMIN D3) 25 MCG (1000 UNIT) tablet Take 500 Units by mouth daily.   Marland Kitchen donepezil (ARICEPT) 10 MG tablet Take 10 mg by mouth at bedtime.   . fluticasone (FLONASE) 50 MCG/ACT nasal spray Place 1 spray into the nose daily.    Marland Kitchen HYDROcodone-acetaminophen (NORCO/VICODIN) 5-325 MG tablet Take 1 tablet by mouth every 6 (six) hours as needed for severe pain.   Marland Kitchen insulin aspart (NOVOLOG FLEXPEN) 100 UNIT/ML FlexPen Inject into the skin as needed for high blood sugar.   . Insulin Glargine (BASAGLAR KWIKPEN) 100 UNIT/ML SOPN Inject 16 Units into the skin daily.    Marland Kitchen levothyroxine (SYNTHROID, LEVOTHROID) 50 MCG tablet Take 25 mcg by mouth daily before breakfast.    . lidocaine (LIDODERM) 5 % Place 1 patch onto the skin every 12 (twelve) hours. Remove & Discard patch within 12 hours or as directed by MD   . melatonin 5 MG TABS Take 5 mg by mouth at bedtime.   . Multiple Vitamin (MULTIVITAMIN WITH MINERALS) TABS tablet Take 1 tablet by mouth daily.   Marland Kitchen omeprazole (PRILOSEC) 40 MG capsule Take 1 capsule (40 mg total) by mouth daily.   . rasagiline (AZILECT) 1 MG TABS tablet Take 1 mg by mouth daily.   . SYMBICORT 80-4.5 MCG/ACT inhaler TAKE 2 PUFFS BY MOUTH TWICE A DAY   . [DISCONTINUED] nitrofurantoin, macrocrystal-monohydrate, (MACROBID) 100 MG capsule Take 1 capsule (100 mg total) by mouth 2 (two) times daily.   . memantine (NAMENDA) 10 MG tablet Take  10 mg by mouth 2 (two) times daily.     No facility-administered encounter medications on file as of 07/25/2020.    Surgical History: Past Surgical History:  Procedure Laterality Date  . ABDOMINAL HYSTERECTOMY    . CERUMEN REMOVAL Bilateral 09/15/2019   Procedure: CERUMEN REMOVAL;  Surgeon: Beverly Gust, MD;  Location: [place];  Service: ENT;  Laterality: Bilateral;  Diabetic - insulin  . CHOLECYSTECTOMY    . COLONOSCOPY WITH PROPOFOL N/A 03/29/2015   Procedure: COLONOSCOPY WITH PROPOFOL;   Surgeon: Hulen Luster, MD;  Location: [facility] ENDOSCOPY;  Service: Gastroenterology;  Laterality: N/A;  . ESOPHAGOGASTRODUODENOSCOPY (EGD) WITH PROPOFOL N/A 03/29/2015   Procedure: ESOPHAGOGASTRODUODENOSCOPY (EGD) WITH PROPOFOL;  Surgeon: Hulen Luster, MD;  Location: [facility] ENDOSCOPY;  Service: Gastroenterology;  Laterality: N/A;  . HIP PINNING,CANNULATED Left 07/30/2019   Procedure: CANNULATED HIP PINNING;  Surgeon: Corky Mull, MD;  Location: [facility] ORS;  Service: Orthopedics;  Laterality: Left;  . lung collapse     broken rib from fall punctured lung    Medical History: Past Medical History:  Diagnosis Date  . Asthma   . Collapsed lung   . Dementia ([place])   . Diabetes mellitus without complication ([place])    Patient takes Insulin twice a day.   Marland Kitchen GERD (gastroesophageal reflux disease)   . Hyperlipidemia   . Hypertension   . Hypothyroidism   . Parkinson's disease ([place])   . Reactive airway disease     Family History: Family History  Problem Relation Age of Onset  . Bladder Cancer Neg Hx   . Kidney cancer Neg Hx     Social History   Socioeconomic History  . Marital status: Widowed    Spouse name: Not on file  . Number of children: Not on file  . Years of education: Not on file  . Highest education level: Not on file  Occupational History  . Not on file  Tobacco Use  . Smoking status: Never Smoker  . Smokeless tobacco: Never Used  Vaping Use  . Vaping Use: Never used  Substance and Sexual Activity  . Alcohol use: Not Currently  . Drug use: No  . Sexual activity: Not on file  Other Topics Concern  . Not on file  Social History Narrative  . Not on file   Social Determinants of Health   Financial Resource Strain: Not on file  Food Insecurity: Not on file  Transportation Needs: Not on file  Physical Activity: Not on file  Stress: Not on file  Social Connections: Not on file  Intimate Partner Violence: Not on file     Review of Systems  Neurological:       Unable to  respond or wake up fully for exam    Vital Signs: BP 103/64   Pulse 63   Temp (!) 97.5 F (36.4 C)   Ht 5\' 2"  (1.575 m)   Wt 94 lb (42.6 kg)   BMI 17.19 kg/m    Observation/Objective: Eyes remained closed, blinking reflexed and facial grimacing noted.  Assessment/Plan:   General Counseling: Orella verbalizes understanding of the findings of today's phone visit and agrees with plan of treatment. I have discussed any further diagnostic evaluation that may be needed or ordered today. We also reviewed her medications today. she has been encouraged to call the office with any questions or concerns that should arise related to todays visit.    Orders Placed This Encounter  Procedures  . UA/M w/rflx Culture, Routine  No orders of the defined types were placed in this encounter.    Time spent:*** Minutes   Tanna Furry. Fabrice Dyal AGNP-C Internal medicine

## 2020-07-27 ENCOUNTER — Other Ambulatory Visit: Payer: Self-pay | Admitting: Internal Medicine

## 2020-07-28 ENCOUNTER — Encounter: Payer: Self-pay | Admitting: Internal Medicine

## 2020-07-28 ENCOUNTER — Other Ambulatory Visit: Payer: Self-pay | Admitting: Hospice and Palliative Medicine

## 2020-07-28 MED ORDER — CIPROFLOXACIN HCL 500 MG PO TABS: 500.0000 mg | ORAL_TABLET | Freq: Two times a day (BID) | ORAL | 0 refills | Status: DC

## 2020-08-06 ENCOUNTER — Telehealth: Payer: Self-pay

## 2020-08-06 ENCOUNTER — Other Ambulatory Visit: Payer: Self-pay

## 2020-08-06 DIAGNOSIS — G20 Parkinson's disease: Secondary | ICD-10-CM

## 2020-08-06 DIAGNOSIS — G20A1 Parkinson's disease without dyskinesia, without mention of fluctuations: Secondary | ICD-10-CM

## 2020-08-06 NOTE — Telephone Encounter (Signed)
Attempted to contact patient's daughter [name] to schedule a Palliative Care consult appointment. No answer left a message to return call.

## 2020-08-06 NOTE — Telephone Encounter (Signed)
Pt daughter called that she like to go for palliative care as per dr Humphrey Rolls send referral to [facility] and also I spoke with dawn from [facility] and they received and contact pt daughter

## 2020-08-06 NOTE — Telephone Encounter (Signed)
Spoke with patient's daughter [name] and scheduled an in-person Palliative Consult for 08/14/20 @ 1:15-1:30PM  COVID screening was negative. Daughter does have pets she will put away before NP arrives. Patient lives with daughter  Consent obtained; updated Outlook/Netsmart/Team List and Epic.  Family is aware they may be receiving a call from NP the day before or day of to confirm appointment.

## 2020-08-08 ENCOUNTER — Emergency Department: Payer: Medicare HMO

## 2020-08-08 ENCOUNTER — Encounter: Payer: Self-pay | Admitting: Emergency Medicine

## 2020-08-08 ENCOUNTER — Other Ambulatory Visit: Payer: Self-pay

## 2020-08-08 ENCOUNTER — Inpatient Hospital Stay
Admission: EM | Admit: 2020-08-08 | Discharge: 2020-08-10 | DRG: 312 | Disposition: A | Discharge status: Home / Self Care | Payer: Medicare HMO | Attending: Family Medicine | Admitting: Family Medicine

## 2020-08-08 DIAGNOSIS — M25552 Pain in left hip: Secondary | ICD-10-CM

## 2020-08-08 DIAGNOSIS — Z79899 Other long term (current) drug therapy: Secondary | ICD-10-CM | POA: Diagnosis not present

## 2020-08-08 DIAGNOSIS — E785 Hyperlipidemia, unspecified: Secondary | ICD-10-CM | POA: Diagnosis present

## 2020-08-08 DIAGNOSIS — R55 Syncope and collapse: Secondary | ICD-10-CM | POA: Diagnosis not present

## 2020-08-08 DIAGNOSIS — I10 Essential (primary) hypertension: Secondary | ICD-10-CM | POA: Diagnosis present

## 2020-08-08 DIAGNOSIS — K219 Gastro-esophageal reflux disease without esophagitis: Secondary | ICD-10-CM | POA: Diagnosis present

## 2020-08-08 DIAGNOSIS — Z7951 Long term (current) use of inhaled steroids: Secondary | ICD-10-CM

## 2020-08-08 DIAGNOSIS — Z79890 Hormone replacement therapy (postmenopausal): Secondary | ICD-10-CM

## 2020-08-08 DIAGNOSIS — Z794 Long term (current) use of insulin: Secondary | ICD-10-CM | POA: Diagnosis not present

## 2020-08-08 DIAGNOSIS — G20 Parkinson's disease: Secondary | ICD-10-CM | POA: Diagnosis present

## 2020-08-08 DIAGNOSIS — L89152 Pressure ulcer of sacral region, stage 2: Secondary | ICD-10-CM | POA: Diagnosis present

## 2020-08-08 DIAGNOSIS — N183 Chronic kidney disease, stage 3 unspecified: Secondary | ICD-10-CM | POA: Diagnosis not present

## 2020-08-08 DIAGNOSIS — F0150 Vascular dementia without behavioral disturbance: Secondary | ICD-10-CM | POA: Diagnosis present

## 2020-08-08 DIAGNOSIS — R532 Functional quadriplegia: Secondary | ICD-10-CM | POA: Diagnosis present

## 2020-08-08 DIAGNOSIS — I2699 Other pulmonary embolism without acute cor pulmonale: Secondary | ICD-10-CM | POA: Diagnosis present

## 2020-08-08 DIAGNOSIS — F0280 Dementia in other diseases classified elsewhere without behavioral disturbance: Secondary | ICD-10-CM | POA: Diagnosis present

## 2020-08-08 DIAGNOSIS — I951 Orthostatic hypotension: Secondary | ICD-10-CM | POA: Diagnosis present

## 2020-08-08 DIAGNOSIS — G309 Alzheimer's disease, unspecified: Secondary | ICD-10-CM | POA: Diagnosis present

## 2020-08-08 DIAGNOSIS — E039 Hypothyroidism, unspecified: Secondary | ICD-10-CM

## 2020-08-08 DIAGNOSIS — J45909 Unspecified asthma, uncomplicated: Secondary | ICD-10-CM | POA: Diagnosis present

## 2020-08-08 DIAGNOSIS — N1831 Chronic kidney disease, stage 3a: Secondary | ICD-10-CM | POA: Diagnosis present

## 2020-08-08 DIAGNOSIS — I700 Atherosclerosis of aorta: Secondary | ICD-10-CM | POA: Diagnosis present

## 2020-08-08 DIAGNOSIS — I129 Hypertensive chronic kidney disease with stage 1 through stage 4 chronic kidney disease, or unspecified chronic kidney disease: Secondary | ICD-10-CM | POA: Diagnosis present

## 2020-08-08 DIAGNOSIS — Z90710 Acquired absence of both cervix and uterus: Secondary | ICD-10-CM

## 2020-08-08 DIAGNOSIS — L89151 Pressure ulcer of sacral region, stage 1: Secondary | ICD-10-CM

## 2020-08-08 DIAGNOSIS — E1122 Type 2 diabetes mellitus with diabetic chronic kidney disease: Secondary | ICD-10-CM | POA: Diagnosis present

## 2020-08-08 DIAGNOSIS — Z9181 History of falling: Secondary | ICD-10-CM

## 2020-08-08 DIAGNOSIS — G9341 Metabolic encephalopathy: Secondary | ICD-10-CM | POA: Diagnosis present

## 2020-08-08 DIAGNOSIS — R4182 Altered mental status, unspecified: Secondary | ICD-10-CM | POA: Diagnosis present

## 2020-08-08 DIAGNOSIS — R630 Anorexia: Secondary | ICD-10-CM | POA: Diagnosis present

## 2020-08-08 DIAGNOSIS — Z9049 Acquired absence of other specified parts of digestive tract: Secondary | ICD-10-CM | POA: Diagnosis not present

## 2020-08-08 DIAGNOSIS — Z20822 Contact with and (suspected) exposure to covid-19: Secondary | ICD-10-CM | POA: Diagnosis present

## 2020-08-08 DIAGNOSIS — R404 Transient alteration of awareness: Secondary | ICD-10-CM

## 2020-08-08 DIAGNOSIS — G20A1 Parkinson's disease without dyskinesia, without mention of fluctuations: Secondary | ICD-10-CM | POA: Diagnosis present

## 2020-08-08 LAB — CBC WITH DIFFERENTIAL/PLATELET
Abs Immature Granulocytes: 0.02 10*3/uL (ref 0.00–0.07)
Basophils Absolute: 0 10*3/uL (ref 0.0–0.1)
Basophils Relative: 1 %
Eosinophils Absolute: 0 10*3/uL (ref 0.0–0.5)
Eosinophils Relative: 1 %
HCT: 45.1 % (ref 36.0–46.0)
Hemoglobin: 15.6 g/dL — ABNORMAL HIGH (ref 12.0–15.0)
Immature Granulocytes: 1 %
Lymphocytes Relative: 22 %
Lymphs Abs: 1 10*3/uL (ref 0.7–4.0)
MCH: 30.4 pg (ref 26.0–34.0)
MCHC: 34.6 g/dL (ref 30.0–36.0)
MCV: 87.9 fL (ref 80.0–100.0)
Monocytes Absolute: 0.3 10*3/uL (ref 0.1–1.0)
Monocytes Relative: 7 %
Neutro Abs: 2.9 10*3/uL (ref 1.7–7.7)
Neutrophils Relative %: 68 %
Platelets: 146 10*3/uL — ABNORMAL LOW (ref 150–400)
RBC: 5.13 MIL/uL — ABNORMAL HIGH (ref 3.87–5.11)
RDW: 12.6 % (ref 11.5–15.5)
WBC: 4.2 10*3/uL (ref 4.0–10.5)
nRBC: 0 % (ref 0.0–0.2)

## 2020-08-08 LAB — COMPREHENSIVE METABOLIC PANEL
ALT: 5 U/L (ref 0–44)
AST: 29 U/L (ref 15–41)
Albumin: 4.1 g/dL (ref 3.5–5.0)
Alkaline Phosphatase: 78 U/L (ref 38–126)
Anion gap: 14 (ref 5–15)
BUN: 38 mg/dL — ABNORMAL HIGH (ref 8–23)
CO2: 26 mmol/L (ref 22–32)
Calcium: 9.5 mg/dL (ref 8.9–10.3)
Chloride: 97 mmol/L — ABNORMAL LOW (ref 98–111)
Creatinine, Ser: 1.25 mg/dL — ABNORMAL HIGH (ref 0.44–1.00)
GFR, Estimated: 43 mL/min — ABNORMAL LOW (ref >60)
Glucose, Bld: 155 mg/dL — ABNORMAL HIGH (ref 70–99)
Potassium: 4.1 mmol/L (ref 3.5–5.1)
Sodium: 137 mmol/L (ref 135–145)
Total Bilirubin: 2.2 mg/dL — ABNORMAL HIGH (ref 0.3–1.2)
Total Protein: 7 g/dL (ref 6.5–8.1)

## 2020-08-08 LAB — URINALYSIS, COMPLETE (UACMP) WITH MICROSCOPIC
Bacteria, UA: NONE SEEN
Bilirubin Urine: NEGATIVE
Glucose, UA: 150 mg/dL — AB
Ketones, ur: 5 mg/dL — AB
Leukocytes,Ua: NEGATIVE
Nitrite: NEGATIVE
Protein, ur: 100 mg/dL — AB
Specific Gravity, Urine: 1.024 (ref 1.005–1.030)
Squamous Epithelial / HPF: NONE SEEN (ref 0–5)
pH: 5 (ref 5.0–8.0)

## 2020-08-08 LAB — HEMOGLOBIN A1C
Hgb A1c MFr Bld: 8.3 % — ABNORMAL HIGH (ref 4.8–5.6)
Mean Plasma Glucose: 191.51 mg/dL

## 2020-08-08 LAB — TROPONIN I (HIGH SENSITIVITY)
Troponin I (High Sensitivity): 23 ng/L — ABNORMAL HIGH (ref <18)
Troponin I (High Sensitivity): 19 ng/L — ABNORMAL HIGH (ref <18)

## 2020-08-08 LAB — TSH: TSH: 0.509 u[IU]/mL (ref 0.350–4.500)

## 2020-08-08 LAB — RESP PANEL BY RT-PCR (FLU A&B, COVID) ARPGX2
Influenza A by PCR: NEGATIVE
Influenza B by PCR: NEGATIVE
SARS Coronavirus 2 by RT PCR: NEGATIVE

## 2020-08-08 LAB — CBG MONITORING, ED: Glucose-Capillary: 244 mg/dL — ABNORMAL HIGH (ref 70–99)

## 2020-08-08 LAB — APTT: aPTT: 26 seconds (ref 24–36)

## 2020-08-08 LAB — PROTIME-INR
INR: 1.2 (ref 0.8–1.2)
Prothrombin Time: 14.5 seconds (ref 11.4–15.2)

## 2020-08-08 LAB — FOLATE: Folate: 21.3 ng/mL (ref >5.9)

## 2020-08-08 MED ORDER — INSULIN ASPART 100 UNIT/ML ~~LOC~~ SOLN
0.0000 [IU] | Freq: Three times a day (TID) | SUBCUTANEOUS | Status: DC
  Administered 2020-08-09 (×2): 2 [IU] via SUBCUTANEOUS
  Administered 2020-08-09: 7 [IU] via SUBCUTANEOUS
  Administered 2020-08-10: 5 [IU] via SUBCUTANEOUS
  Administered 2020-08-10: 2 [IU] via SUBCUTANEOUS
  Filled 2020-08-08 (×5): qty 1

## 2020-08-08 MED ORDER — CARBIDOPA-LEVODOPA 25-100 MG PO TABS: 1.0000 | ORAL_TABLET | ORAL | Status: DC

## 2020-08-08 MED ORDER — ATORVASTATIN CALCIUM 10 MG PO TABS
5.0000 mg | ORAL_TABLET | Freq: Every day | ORAL | Status: DC
  Administered 2020-08-09 – 2020-08-10 (×2): 5 mg via ORAL
  Filled 2020-08-08: qty 1
  Filled 2020-08-08: qty 0.5

## 2020-08-08 MED ORDER — ACETAMINOPHEN 325 MG PO TABS
650.0000 mg | ORAL_TABLET | Freq: Four times a day (QID) | ORAL | Status: DC | PRN
  Administered 2020-08-09: 650 mg via ORAL
  Filled 2020-08-08: qty 2

## 2020-08-08 MED ORDER — MELATONIN 5 MG PO TABS
5.0000 mg | ORAL_TABLET | Freq: Every day | ORAL | Status: DC
  Administered 2020-08-08: 5 mg via ORAL
  Filled 2020-08-08 (×2): qty 1

## 2020-08-08 MED ORDER — ADULT MULTIVITAMIN W/MINERALS CH
1.0000 | ORAL_TABLET | Freq: Every day | ORAL | Status: DC
  Administered 2020-08-08 – 2020-08-10 (×3): 1 via ORAL
  Filled 2020-08-08 (×4): qty 1

## 2020-08-08 MED ORDER — CARBIDOPA-LEVODOPA 25-100 MG PO TABS
0.5000 | ORAL_TABLET | Freq: Three times a day (TID) | ORAL | Status: DC
  Administered 2020-08-08 – 2020-08-10 (×6): 0.5 via ORAL
  Filled 2020-08-08 (×7): qty 0.5

## 2020-08-08 MED ORDER — SODIUM CHLORIDE 0.9% FLUSH: 3.0000 mL | INTRAVENOUS | Status: DC | PRN

## 2020-08-08 MED ORDER — PANTOPRAZOLE SODIUM 40 MG PO TBEC
40.0000 mg | DELAYED_RELEASE_TABLET | Freq: Every day | ORAL | Status: DC
  Administered 2020-08-08 – 2020-08-10 (×3): 40 mg via ORAL
  Filled 2020-08-08 (×3): qty 1

## 2020-08-08 MED ORDER — ONDANSETRON HCL 4 MG/2ML IJ SOLN: 4.0000 mg | Freq: Four times a day (QID) | INTRAMUSCULAR | Status: DC | PRN

## 2020-08-08 MED ORDER — FLUTICASONE PROPIONATE 50 MCG/ACT NA SUSP
1.0000 | Freq: Every day | NASAL | Status: DC
  Administered 2020-08-09 – 2020-08-10 (×2): 1 via NASAL
  Filled 2020-08-08: qty 16

## 2020-08-08 MED ORDER — CARBIDOPA-LEVODOPA 25-100 MG PO TABS
1.0000 | ORAL_TABLET | Freq: Every day | ORAL | Status: DC
  Administered 2020-08-09 – 2020-08-10 (×2): 1 via ORAL
  Filled 2020-08-08 (×2): qty 1

## 2020-08-08 MED ORDER — SODIUM CHLORIDE 0.9% FLUSH
3.0000 mL | Freq: Two times a day (BID) | INTRAVENOUS | Status: DC
  Administered 2020-08-08 – 2020-08-10 (×4): 3 mL via INTRAVENOUS

## 2020-08-08 MED ORDER — DONEPEZIL HCL 5 MG PO TABS
10.0000 mg | ORAL_TABLET | Freq: Every day | ORAL | Status: DC
  Administered 2020-08-08: 10 mg via ORAL
  Filled 2020-08-08: qty 2

## 2020-08-08 MED ORDER — RASAGILINE MESYLATE 1 MG PO TABS
1.0000 mg | ORAL_TABLET | Freq: Every day | ORAL | Status: DC
Start: 2020-08-08 — End: 2020-08-10
  Administered 2020-08-08 – 2020-08-10 (×3): 1 mg via ORAL
  Filled 2020-08-08 (×3): qty 1

## 2020-08-08 MED ORDER — HEPARIN (PORCINE) 25000 UT/250ML-% IV SOLN
750.0000 [IU]/h | INTRAVENOUS | Status: DC
  Administered 2020-08-08: 750 [IU]/h via INTRAVENOUS
  Filled 2020-08-08: qty 250

## 2020-08-08 MED ORDER — MEMANTINE HCL 10 MG PO TABS
10.0000 mg | ORAL_TABLET | Freq: Two times a day (BID) | ORAL | Status: DC
  Administered 2020-08-08 – 2020-08-10 (×4): 10 mg via ORAL
  Filled 2020-08-08: qty 2
  Filled 2020-08-08 (×3): qty 1
  Filled 2020-08-08: qty 2

## 2020-08-08 MED ORDER — ONDANSETRON HCL 4 MG PO TABS: 4.0000 mg | ORAL_TABLET | Freq: Four times a day (QID) | ORAL | Status: DC | PRN

## 2020-08-08 MED ORDER — LIDOCAINE 5 % EX PTCH
1.0000 | MEDICATED_PATCH | Freq: Two times a day (BID) | CUTANEOUS | Status: DC
  Administered 2020-08-09 – 2020-08-10 (×3): 1 via TRANSDERMAL
  Filled 2020-08-08 (×6): qty 1

## 2020-08-08 MED ORDER — CARBIDOPA-LEVODOPA ER 25-100 MG PO TBCR
1.0000 | EXTENDED_RELEASE_TABLET | Freq: Every day | ORAL | Status: DC
  Administered 2020-08-08 – 2020-08-09 (×2): 1 via ORAL
  Filled 2020-08-08 (×3): qty 1

## 2020-08-08 MED ORDER — MOMETASONE FURO-FORMOTEROL FUM 100-5 MCG/ACT IN AERO
2.0000 | INHALATION_SPRAY | Freq: Two times a day (BID) | RESPIRATORY_TRACT | Status: DC
  Administered 2020-08-08 – 2020-08-10 (×3): 2 via RESPIRATORY_TRACT
  Filled 2020-08-08: qty 8.8

## 2020-08-08 MED ORDER — IOHEXOL 350 MG/ML SOLN
60.0000 mL | Freq: Once | INTRAVENOUS | Status: AC | PRN
  Administered 2020-08-08: 60 mL via INTRAVENOUS

## 2020-08-08 MED ORDER — SODIUM CHLORIDE 0.9 % IV SOLN: 250.0000 mL | INTRAVENOUS | Status: DC | PRN

## 2020-08-08 MED ORDER — LEVOTHYROXINE SODIUM 25 MCG PO TABS
25.0000 ug | ORAL_TABLET | Freq: Every day | ORAL | Status: DC
  Administered 2020-08-09 – 2020-08-10 (×2): 25 ug via ORAL
  Filled 2020-08-08 (×2): qty 1

## 2020-08-08 MED ORDER — HEPARIN BOLUS VIA INFUSION
3000.0000 [IU] | Freq: Once | INTRAVENOUS | Status: AC
  Administered 2020-08-08: 3000 [IU] via INTRAVENOUS
  Filled 2020-08-08: qty 3000

## 2020-08-08 MED ORDER — ACETAMINOPHEN 650 MG RE SUPP: 650.0000 mg | Freq: Four times a day (QID) | RECTAL | Status: DC | PRN

## 2020-08-08 MED ORDER — VITAMIN D 25 MCG (1000 UNIT) PO TABS
500.0000 [IU] | ORAL_TABLET | Freq: Every day | ORAL | Status: DC
  Administered 2020-08-08 – 2020-08-10 (×3): 500 [IU] via ORAL
  Filled 2020-08-08 (×3): qty 1

## 2020-08-08 NOTE — ED Notes (Signed)
Patient given warm blankets.

## 2020-08-08 NOTE — H&P (Signed)
History and Physical    Jacqueline [name] NID:[id number] DOB: December 07, 1935 DOA: 08/08/2020  PCP: Lavera Guise, MD   Patient coming from: Home  I have personally briefly reviewed patient's old medical records in [place]  Chief Complaint: Altered mental status Most of the history was obtained from patient's daughter at the bedside.  HPI: Jacqueline [name] is a 85 y.o. female with medical history significant for Parkinson's disease, GERD, hypertension, hypothyroidism, dementia who was brought into the ER by EMS for evaluation of mental status changes.  Per her daughter patient usually has episodes of transient mental status changes which resolved in about 3 to 5 minutes but over the last 1 week the frequency of these episodes has increased and they seem to be lasting longer.  The episode she had today lasted about 20 minutes.  According to the daughter she usually gets these episodes with any form of change in position such as sitting up or when she stands up.  She has discussed this with the patient's neurologist in the past.  There is no report of urinary or fecal incontinence, no tongue bite or jerking movements involving her upper or lower extremities. She was seen in the emergency room about 3 weeks ago for evaluation of a similar episode but at that time she had hypoglycemia. I am unable to do a review of systems on this patient due to her mental status changes. Labs show sodium 137, potassium 4.1, chloride 97, bicarb 26, glucose 155, BUN 38, creatinine 1.25, calcium 9.5, alkaline phosphatase 78, albumin 4.1, AST 29, ALT 5, total protein 7.0, total bilirubin 2.2, white count 4.2, hemoglobin 15.6, hematocrit 45.1, MCV 87.9, RDW 12.6, platelet count 146, PT 14.5, INR 1.2 Respiratory viral panel is negative CT angiogram of the chest shows small amount of pulmonary embolism within a posteromedial subsegmental lower lobe branch of the right pulmonary artery. Marked severity intrahepatic biliary  dilatation. Correlation withliver function tests is recommended. Twelve-lead EKG reviewed by me shows sinus rhythm with PACs and LVH    ED Course: Patient is an 85 year old female with a history of Parkinson's disease and dementia who was brought into the ER by EMS for evaluation of an episode of transient mental status changes which daughter states has been going on but with increased frequency and lasting longer.  Patient appears to be back to her baseline mental status at this time.  ER physician concerned about possible syncope versus seizure and requested a work-up for same.  CT angiogram of the chest shows small pulmonary embolism within the posterior medial subsegmental lower lobe branch of the right pulmonary artery.  Patient will be admitted to the hospital for further evaluation and treatment.  Review of Systems: As per HPI otherwise all other systems reviewed and negative.    Past Medical History:  Diagnosis Date  . Asthma   . Collapsed lung   . Dementia ([place])   . Diabetes mellitus without complication ([place])    Patient takes Insulin twice a day.   Marland Kitchen GERD (gastroesophageal reflux disease)   . Hyperlipidemia   . Hypertension   . Hypothyroidism   . Parkinson's disease ([place])   . Reactive airway disease     Past Surgical History:  Procedure Laterality Date  . ABDOMINAL HYSTERECTOMY    . CERUMEN REMOVAL Bilateral 09/15/2019   Procedure: CERUMEN REMOVAL;  Surgeon: Beverly Gust, MD;  Location: [facility];  Service: ENT;  Laterality: Bilateral;  Diabetic - insulin  . CHOLECYSTECTOMY    .  COLONOSCOPY WITH PROPOFOL N/A 03/29/2015   Procedure: COLONOSCOPY WITH PROPOFOL;  Surgeon: Hulen Luster, MD;  Location[facility] ENDOSCOPY;  Service: Gastroenterology;  Laterality: N/A;  . ESOPHAGOGASTRODUODENOSCOPY (EGD) WITH PROPOFOL N/A 03/29/2015   Procedure: ESOPHAGOGASTRODUODENOSCOPY (EGD) WITH PROPOFOL;  Surgeon: Hulen Luster, MD;  Location: [facility] ENDOSCOPY;  Service: Gastroenterology;   Laterality: N/A;  . HIP PINNING,CANNULATED Left 07/30/2019   Procedure: CANNULATED HIP PINNING;  Surgeon: Corky Mull, MD;  Location: [facility] ORS;  Service: Orthopedics;  Laterality: Left;  . lung collapse     broken rib from fall punctured lung     reports that she has never smoked. She has never used smokeless tobacco. She reports previous alcohol use. She reports that she does not use drugs.  No Known Allergies  Family History  Problem Relation Age of Onset  . Bladder Cancer Neg Hx   . Kidney cancer Neg Hx       Prior to Admission medications   Medication Sig Start Date End Date Taking? Authorizing Provider  Accu-Chek FastClix Lancets MISC Once a day 04/30/20   Luiz Ochoa, NP  atorvastatin (LIPITOR) 10 MG tablet TAKE A HALF TABLET BY MOUTH EVERY DAY Patient taking differently: Take 5 mg by mouth daily. TAKE A HALF TABLET BY MOUTH EVERY DAY 04/29/20   Lavera Guise, MD  Calcium-Vitamin D-Vitamin K [phone] MG-UNT-MCG CHEW Chew by mouth.    [provider]  carbidopa-levodopa (SINEMET IR) 25-100 MG tablet Take by mouth See admin instructions. Take 1 tablet by mouth at 10 am, take 1/2 tablet by mouth at 1 pm, take 1/2 tablet at 4 pm, take 1/2 tablet at 7 pm. 07/07/16   [provider]  Carbidopa-Levodopa ER (SINEMET CR) 25-100 MG tablet controlled release Take 1 tablet by mouth at bedtime. 04/09/19   [provider]  cholecalciferol (VITAMIN D3) 25 MCG (1000 UNIT) tablet Take 500 Units by mouth daily.    [provider]  ciprofloxacin (CIPRO) 500 MG tablet Take 1 tablet (500 mg total) by mouth 2 (two) times daily. Patient not taking: Reported on 08/08/2020 07/28/20   Luiz Ochoa, NP  donepezil (ARICEPT) 10 MG tablet Take 10 mg by mouth at bedtime.    [provider]  fluticasone (FLONASE) 50 MCG/ACT nasal spray Place 1 spray into the nose daily.     [provider]  HYDROcodone-acetaminophen (NORCO/VICODIN) 5-325 MG tablet  Take 1 tablet by mouth every 6 (six) hours as needed for severe pain. 08/01/19   Lattie Corns, PA-C  insulin aspart (NOVOLOG FLEXPEN) 100 UNIT/ML FlexPen Inject into the skin as needed for high blood sugar.    [provider]  Insulin Glargine (BASAGLAR KWIKPEN) 100 UNIT/ML SOPN Inject 16 Units into the skin daily.     [provider]  levothyroxine (SYNTHROID, LEVOTHROID) 50 MCG tablet Take 25 mcg by mouth daily before breakfast.     [provider]  lidocaine (LIDODERM) 5 % Place 1 patch onto the skin every 12 (twelve) hours. Remove & Discard patch within 12 hours or as directed by MD 01/29/20 01/28/21  Nance Pear, MD  melatonin 5 MG TABS Take 5 mg by mouth at bedtime.    [provider]  memantine (NAMENDA) 10 MG tablet Take 10 mg by mouth 2 (two) times daily.  04/13/17 09/11/19  [provider]  Multiple Vitamin (MULTIVITAMIN WITH MINERALS) TABS tablet Take 1 tablet by mouth daily.    [provider]  omeprazole ([place])  40 MG capsule TAKE 1 CAPSULE BY MOUTH EVERY DAY Patient taking differently: Take 40 mg by mouth daily. 07/29/20   Luiz Ochoa, NP  rasagiline (AZILECT) 1 MG TABS tablet Take 1 mg by mouth daily.    [provider]  SYMBICORT 80-4.5 MCG/ACT inhaler TAKE 2 PUFFS BY MOUTH TWICE A DAY 08/31/19   Lavera Guise, MD    Physical Exam: Vitals:   08/08/20 1340 08/08/20 1430 08/08/20 1554 08/08/20 1700  BP:  (!) 141/99 (!) 151/96 122/74  Pulse:  77 64 76  Resp:  13 (!) 25 16  Temp:      TempSrc:      SpO2:  99% 95% 100%  Weight: 45.4 kg     Height: 5\' 2"  (1.575 m)        Vitals:   08/08/20 1340 08/08/20 1430 08/08/20 1554 08/08/20 1700  BP:  (!) 141/99 (!) 151/96 122/74  Pulse:  77 64 76  Resp:  13 (!) 25 16  Temp:      TempSrc:      SpO2:  99% 95% 100%  Weight: 45.4 kg     Height: 5\' 2"  (1.575 m)         Constitutional: Alert and awake . Not in any apparent distress.  Patient with  involuntary, jerking movement involving her trunk and upper extremities, thin HEENT:      Head: Normocephalic and atraumatic.         Eyes: PERLA, EOMI, Conjunctivae pallor. Sclera is non-icteric.       Mouth/Throat: Mucous membranes are moist.       Neck: Supple with no signs of meningismus. Cardiovascular: Regular rate and rhythm. No murmurs, gallops, or rubs. 2+ symmetrical distal pulses are present . No JVD. No LE edema Respiratory: Respiratory effort normal .Lungs sounds clear bilaterally. No wheezes, crackles, or rhonchi.  Gastrointestinal: Soft, non tender, and non distended with positive bowel sounds.  Genitourinary: No CVA tenderness. Musculoskeletal:  Involuntary, jerking movements involving her extremities. No cyanosis, or erythema of extremities. Neurologic:  Face is symmetric. Moving all extremities. No gross focal neurologic deficits  Skin: Skin is warm, dry.  No rash or ulcers Psychiatric: Mood and affect are normal   Labs on Admission: I have personally reviewed following labs and imaging studies  CBC: Recent Labs  Lab 08/08/20 1350  WBC 4.2  NEUTROABS 2.9  HGB 15.6*  HCT 45.1  MCV 87.9  PLT 947*   Basic Metabolic Panel: Recent Labs  Lab 08/08/20 1350  NA 137  K 4.1  CL 97*  CO2 26  GLUCOSE 155*  BUN 38*  CREATININE 1.25*  CALCIUM 9.5   GFR: Estimated Creatinine Clearance: 24.9 mL/min (A) (by C-G formula based on SCr of 1.25 mg/dL (H)). Liver Function Tests: Recent Labs  Lab 08/08/20 1350  AST 29  ALT 5  ALKPHOS 78  BILITOT 2.2*  PROT 7.0  ALBUMIN 4.1   No results for input(s): LIPASE, AMYLASE in the last 168 hours. No results for input(s): AMMONIA in the last 168 hours. Coagulation Profile: Recent Labs  Lab 08/08/20 1615  INR 1.2   Cardiac Enzymes: No results for input(s): CKTOTAL, CKMB, CKMBINDEX, TROPONINI in the last 168 hours. BNP (last 3 results) No results for input(s): PROBNP in the last 8760 hours. HbA1C: No results for  input(s): HGBA1C in the last 72 hours. CBG: No results for input(s): GLUCAP in the last 168 hours. Lipid Profile: No results for input(s): CHOL, HDL, LDLCALC, TRIG,  CHOLHDL, LDLDIRECT in the last 72 hours. Thyroid Function Tests: No results for input(s): TSH, T4TOTAL, FREET4, T3FREE, THYROIDAB in the last 72 hours. Anemia Panel: No results for input(s): VITAMINB12, FOLATE, FERRITIN, TIBC, IRON, RETICCTPCT in the last 72 hours. Urine analysis:    Component Value Date/Time   COLORURINE AMBER (A) 08/08/2020 1350   APPEARANCEUR HAZY (A) 08/08/2020 1350   APPEARANCEUR Clear 06/20/2019 0000   LABSPEC 1.024 08/08/2020 1350   LABSPEC 1.031 08/11/2012 2247   PHURINE 5.0 08/08/2020 1350   GLUCOSEU 150 (A) 08/08/2020 1350   GLUCOSEU 50 mg/dL 08/11/2012 2247   HGBUR SMALL (A) 08/08/2020 1350   BILIRUBINUR NEGATIVE 08/08/2020 1350   BILIRUBINUR Negative 06/20/2019 0000   BILIRUBINUR Negative 08/11/2012 2247   KETONESUR 5 (A) 08/08/2020 1350   PROTEINUR 100 (A) 08/08/2020 1350   NITRITE NEGATIVE 08/08/2020 1350   LEUKOCYTESUR NEGATIVE 08/08/2020 1350   LEUKOCYTESUR Negative 08/11/2012 2247    Radiological Exams on Admission: DG Chest 2 View  Result Date: 08/08/2020 CLINICAL DATA:  Short of breath EXAM: CHEST - 2 VIEW COMPARISON:  04/24/2019 FINDINGS: Heart size and vascularity normal. Negative for heart failure. Mild right basilar pleural scarring unchanged. Negative for pneumonia or heart failure. Moderate dextroscoliosis unchanged. IMPRESSION: Chronic pleural scarring right lung base. No acute cardiopulmonary abnormality. Electronically Signed   By: Franchot Gallo M.D.   On: 08/08/2020 14:40   CT Head Wo Contrast  Result Date: 08/08/2020 CLINICAL DATA:  Altered mental status. EXAM: CT HEAD WITHOUT CONTRAST TECHNIQUE: Contiguous axial images were obtained from the base of the skull through the vertex without intravenous contrast. COMPARISON:  April 25, 2020 FINDINGS: Brain: There is mild  cerebral atrophy with widening of the extra-axial spaces and ventricular dilatation. There are areas of decreased attenuation within the white matter tracts of the supratentorial brain, consistent with microvascular disease changes. Vascular: No hyperdense vessel or unexpected calcification. Skull: Normal. Negative for fracture or focal lesion. Sinuses/Orbits: There is marked severity left maxillary sinus mucosal thickening. Other: None. IMPRESSION: 1. Generalized cerebral atrophy. 2. No acute intracranial abnormality. 3. Left maxillary sinus disease. Electronically Signed   By: Virgina Norfolk M.D.   On: 08/08/2020 15:21   CT Angio Chest PE W and/or Wo Contrast  Result Date: 08/08/2020 CLINICAL DATA:  Altered mental status. EXAM: CT ANGIOGRAPHY CHEST WITH CONTRAST TECHNIQUE: Multidetector CT imaging of the chest was performed using the standard protocol during bolus administration of intravenous contrast. Multiplanar CT image reconstructions and MIPs were obtained to evaluate the vascular anatomy. CONTRAST:  39mL OMNIPAQUE IOHEXOL 350 MG/ML SOLN COMPARISON:  March 17, 2016 FINDINGS: Cardiovascular: There is moderate severity calcification of the thoracic aorta without evidence of aneurysmal dilatation. A small area of intraluminal low attenuation is seen within a posteromedial subsegmental lower lobe branch of the right pulmonary artery (axial CT images 67 through 70, CT series number 4). Normal heart size. No pericardial effusion. Mediastinum/Nodes: No enlarged mediastinal, hilar, or axillary lymph nodes. Thyroid gland, trachea, and esophagus demonstrate no significant findings. Lungs/Pleura: Lungs are clear. No pleural effusion or pneumothorax. Upper Abdomen: There is marked severity intrahepatic biliary dilatation. Musculoskeletal: Degenerative changes seen throughout the thoracic spine. Review of the MIP images confirms the above findings. IMPRESSION: 1. Small amount of pulmonary embolism within a  posteromedial subsegmental lower lobe branch of the right pulmonary artery. 2. Marked severity intrahepatic biliary dilatation. Correlation with liver function tests is recommended. Aortic Atherosclerosis (ICD10-I70.0). Electronically Signed   By: Virgina Norfolk M.D.   On: 08/08/2020  15:30     Assessment/Plan Principal Problem:   AMS (altered mental status) Active Problems:   Type 2 diabetes mellitus with stage 3 chronic kidney disease (HCC)   Essential hypertension   Parkinson's disease ([place])   Acquired hypothyroidism   Dementia due to Parkinson's disease without behavioral disturbance (HCC)   Pulmonary embolism (HCC)     Altered mental status Transient and patient is back to her baseline mental status, she is awake and alert and oriented only to person. Per daughter she has had episodes in the past mostly with movement but is concerned about increased frequency and these episodes lasting longer ??  Syncope related to orthostatic hypotension from Parkinson's disease Review of records from patient's neurologist states that patient was started on midodrine for hypotension.  It is unclear if patient is on this medication at home Rule out seizure disorder.  Will obtain EEG Obtain TSH, vitamin B35 and folic acid levels. Consult neurology   Pulmonary embolism Patient noted to have a small amount of pulmonary embolism within a posterior medial subsegmental lower lobe branch of the right pulmonary artery Started on heparin drip in the emergency room Discussed long-term anticoagulation with patient's daughter and healthcare power of attorney at the bedside Patient is a high risk for falls and is at increased risk for bleeding while on anticoagulation   Diabetes mellitus and stage III chronic kidney disease Maintain consistent carbohydrate diet Glycemic control with sliding scale insulin Accu-Cheks AC at at bedtime    Parkinson's disease with dementia Continue scheduled Sinemet,  rasagiline, Namenda and Aricept    Functional quadriplegia Secondary to Parkinson's disease with dementia Patient requires assistance with all activities of daily living She will need frequent turning to prevent development of pressure ulcers   Hypothyroidism Continue Synthroid  DVT prophylaxis: Heparin Code Status: full code Family Communication: Rater than 50% of time was spent discussing patient's condition and plan of care with her daughter Daune Perch at the bedside.  All questions and concerns have been addressed.  CODE STATUS was discussed and patient is a full code.  She verbalizes understanding and agrees with the plan. Disposition Plan: Back to previous home environment Consults called: Neurology Status: Inpatient    Tochukwu Agbata MD [facility]     08/08/2020, 5:07 PM

## 2020-08-08 NOTE — ED Triage Notes (Signed)
Patient arrives via EMS after an episode of AMS per caretaker. Caretaker states that it lasted approx 20 minutes and does of a history of this. Patient is not AOx4.

## 2020-08-08 NOTE — ED Notes (Signed)
Patient eating dinner tray with assistance of daughter.

## 2020-08-08 NOTE — ED Provider Notes (Signed)
[facility] Emergency Department Provider Note  ____________________________________________   Event Date/Time   First MD Initiated Contact with Patient 08/08/20 1330     (approximate)  I have reviewed the triage vital signs and the nursing notes.   HISTORY  Chief Complaint Altered Mental Status    HPI Jacqueline [name] is a 85 y.o. female with dementia, Parkinson's, hypertension, hyper lipidemia who comes in with altered mental status.  Patient was with her caregiver when she had about 20 minutes where she was not responsive.  She was still breathing but was not able to talk or interact, constant, unclear what brought it on, better on its own with time.  On EMS arrival patient was alert and oriented x3.  Patient states that she has had some shortness of breath.  Otherwise she denies any complaints although somewhat limited due to dementia  Unable to get full HPI due to patient's baseline dementia            Past Medical History:  Diagnosis Date  . Asthma   . Collapsed lung   . Dementia ([place])   . Diabetes mellitus without complication ([place])    Patient takes Insulin twice a day.   Marland Kitchen GERD (gastroesophageal reflux disease)   . Hyperlipidemia   . Hypertension   . Hypothyroidism   . Parkinson's disease ([place])   . Reactive airway disease     Patient Active Problem List   Diagnosis Date Noted  . Goals of care, counseling/discussion   . Palliative care by specialist   . Pressure injury of skin 07/30/2019  . Closed left hip fracture ([place]) 07/29/2019  . Fall at home, initial encounter 07/29/2019  . Acquired hypothyroidism 07/29/2019  . Dementia due to Parkinson's disease without behavioral disturbance ([place]) 07/29/2019  . Reactive airway disease that is not asthma 07/29/2019  . Frequent falls 07/29/2019  . Pressure injury of skin of sacral region 06/12/2019  . Wound healing, delayed 06/12/2019  . Encounter for general adult medical examination  with abnormal findings 06/21/2018  . Uncontrolled type 2 diabetes mellitus with hyperglycemia ([place]) 06/21/2018  . Asthma without status asthmaticus 07/08/2016  . Hyperlipidemia, unspecified 07/08/2016  . Dysphagia 01/07/2016  . Unsteadiness 10/01/2015  . Type 2 diabetes mellitus without complication, without long-term current use of insulin ([place]) 08/07/2015  . Mixed Alzheimer's and vascular dementia ([place]) 08/06/2015  . Controlled type 2 diabetes mellitus without complication, without long-term current use of insulin ([place]) 02/12/2015  . Type 2 diabetes mellitus with stage 3 chronic kidney disease ([place]) 06/19/2014  . Essential hypertension 03/08/2014  . Parkinson's disease ([place]) 01/16/2014  . Fatigue 11/09/2013  . Diabetes mellitus, type II ([place]) 09/15/2013  . Difficulty walking 09/15/2013  . Dizziness 09/15/2013    Past Surgical History:  Procedure Laterality Date  . ABDOMINAL HYSTERECTOMY    . CERUMEN REMOVAL Bilateral 09/15/2019   Procedure: CERUMEN REMOVAL;  Surgeon: Beverly Gust, MD;  Location: [facility];  Service: ENT;  Laterality: Bilateral;  Diabetic - insulin  . CHOLECYSTECTOMY    . COLONOSCOPY WITH PROPOFOL N/A 03/29/2015   Procedure: COLONOSCOPY WITH PROPOFOL;  Surgeon: Hulen Luster, MD;  Location: [facility] ENDOSCOPY;  Service: Gastroenterology;  Laterality: N/A;  . ESOPHAGOGASTRODUODENOSCOPY (EGD) WITH PROPOFOL N/A 03/29/2015   Procedure: ESOPHAGOGASTRODUODENOSCOPY (EGD) WITH PROPOFOL;  Surgeon: Hulen Luster, MD;  Location: [facility] ENDOSCOPY;  Service: Gastroenterology;  Laterality: N/A;  . HIP PINNING,CANNULATED Left 07/30/2019   Procedure: CANNULATED HIP PINNING;  Surgeon: Corky Mull, MD;  Location: [facility] ORS;  Service: Orthopedics;  Laterality: Left;  . lung collapse     broken rib from fall punctured lung    Prior to Admission medications   Medication Sig Start Date End Date Taking? Authorizing Provider  Accu-Chek FastClix Lancets MISC Once a day 04/30/20   Luiz Ochoa, NP  atorvastatin (LIPITOR) 10 MG tablet TAKE A HALF TABLET BY MOUTH EVERY DAY 04/29/20   Lavera Guise, MD  Calcium-Vitamin D-Vitamin K [id number] MG-UNT-MCG CHEW Chew by mouth.    [provider]  carbidopa-levodopa (SINEMET IR) 25-100 MG tablet Take by mouth See admin instructions. Take 1 tablet by mouth at 10 am, take 1/2 tablet by mouth at 1 pm, take 1/2 tablet at 4 pm, take 1/2 tablet at 7 pm. 07/07/16   [provider]  Carbidopa-Levodopa ER (SINEMET CR) 25-100 MG tablet controlled release Take 1 tablet by mouth at bedtime. 04/09/19   [provider]  cholecalciferol (VITAMIN D3) 25 MCG (1000 UNIT) tablet Take 500 Units by mouth daily.    [provider]  ciprofloxacin (CIPRO) 500 MG tablet Take 1 tablet (500 mg total) by mouth 2 (two) times daily. 07/28/20   Luiz Ochoa, NP  donepezil (ARICEPT) 10 MG tablet Take 10 mg by mouth at bedtime.    [provider]  fluticasone (FLONASE) 50 MCG/ACT nasal spray Place 1 spray into the nose daily.     [provider]  HYDROcodone-acetaminophen (NORCO/VICODIN) 5-325 MG tablet Take 1 tablet by mouth every 6 (six) hours as needed for severe pain. 08/01/19   Lattie Corns, PA-C  insulin aspart (NOVOLOG FLEXPEN) 100 UNIT/ML FlexPen Inject into the skin as needed for high blood sugar.    [provider]  Insulin Glargine (BASAGLAR KWIKPEN) 100 UNIT/ML SOPN Inject 16 Units into the skin daily.     [provider]  levothyroxine (SYNTHROID, LEVOTHROID) 50 MCG tablet Take 25 mcg by mouth daily before breakfast.     [provider]  lidocaine (LIDODERM) 5 % Place 1 patch onto the skin every 12 (twelve) hours. Remove & Discard patch within 12 hours or as directed by MD 01/29/20 01/28/21  Nance Pear, MD  melatonin 5 MG TABS Take 5 mg by mouth at bedtime.    [provider]  memantine (NAMENDA) 10 MG tablet Take 10 mg by mouth 2 (two) times daily.  04/13/17  09/11/19  [provider]  Multiple Vitamin (MULTIVITAMIN WITH MINERALS) TABS tablet Take 1 tablet by mouth daily.    [provider]  omeprazole (PRILOSEC) 40 MG capsule TAKE 1 CAPSULE BY MOUTH EVERY DAY 07/29/20   Luiz Ochoa, NP  rasagiline (AZILECT) 1 MG TABS tablet Take 1 mg by mouth daily.    [provider]  SYMBICORT 80-4.5 MCG/ACT inhaler TAKE 2 PUFFS BY MOUTH TWICE A DAY 08/31/19   Lavera Guise, MD    Allergies Patient has no known allergies.  Family History  Problem Relation Age of Onset  . Bladder Cancer Neg Hx   . Kidney cancer Neg Hx     Social History Social History   Tobacco Use  . Smoking status: Never Smoker  . Smokeless tobacco: Never Used  Vaping Use  . Vaping Use: Never used  Substance Use Topics  . Alcohol use: Not Currently  . Drug use: No      Review of Systems Unable to get full review of systems due to patient's baseline dementia ____________________________________________  PHYSICAL EXAM:  VITAL SIGNS: Blood pressure (!) 141/99, pulse 77, temperature 97.6 F (36.4 C), temperature source Axillary, resp. rate 13, height 5\' 2"  (1.575 m), weight 45.4 kg, SpO2 99 %.   Constitutional: Alert and oriented.  Elderly female with tremors Eyes: Conjunctivae are normal. EOMI. Head: Atraumatic. Nose: No congestion/rhinnorhea. Mouth/Throat: Mucous membranes are moist.   Neck: No stridor. Trachea Midline. FROM Cardiovascular: Normal rate, regular rhythm. Grossly normal heart sounds.  Good peripheral circulation. Respiratory: Normal respiratory effort.  No retractions. Lungs CTAB.  Her oxygen levels 94%-97% Gastrointestinal: Soft and nontender. No distention. No abdominal bruits.  Musculoskeletal: No lower extremity tenderness nor edema.  No joint effusions. Neurologic:  Normal speech and language.  Equal strength in arms and legs.  No obvious cranial nerve deficits. Skin:  Skin is warm, dry and intact. No rash  noted. Psychiatric: Mood and affect are normal. Speech and behavior are normal. GU: Deferred   ____________________________________________   LABS (all labs ordered are listed, but only abnormal results are displayed)  Labs Reviewed  CBC WITH DIFFERENTIAL/PLATELET - Abnormal; Notable for the following components:      Result Value   RBC 5.13 (*)    Hemoglobin 15.6 (*)    Platelets 146 (*)    All other components within normal limits  COMPREHENSIVE METABOLIC PANEL - Abnormal; Notable for the following components:   Chloride 97 (*)    Glucose, Bld 155 (*)    BUN 38 (*)    Creatinine, Ser 1.25 (*)    Total Bilirubin 2.2 (*)    GFR, Estimated 43 (*)    All other components within normal limits  URINALYSIS, COMPLETE (UACMP) WITH MICROSCOPIC - Abnormal; Notable for the following components:   Color, Urine AMBER (*)    APPearance HAZY (*)    Glucose, UA 150 (*)    Hgb urine dipstick SMALL (*)    Ketones, ur 5 (*)    Protein, ur 100 (*)    All other components within normal limits  TROPONIN I (HIGH SENSITIVITY) - Abnormal; Notable for the following components:   Troponin I (High Sensitivity) 23 (*)    All other components within normal limits  TROPONIN I (HIGH SENSITIVITY)   ____________________________________________   ED ECG REPORT I, Jacqueline [place]ountain, the attending physician, personally viewed and interpreted this ECG.  Normal sinus rate of 75, no ST elevation, no T wave inversions, PVC normal intervals ____________________________________________  RADIOLOGY Robert Bellow, personally viewed and evaluated these images (plain radiographs) as part of my medical decision making, as well as reviewing the written report by the radiologist.  ED MD interpretation: No pneumonia  Official radiology report(s): DG Chest 2 View  Result Date: 08/08/2020 CLINICAL DATA:  Short of breath EXAM: CHEST - 2 VIEW COMPARISON:  04/24/2019 FINDINGS: Heart size and vascularity normal. Negative  for heart failure. Mild right basilar pleural scarring unchanged. Negative for pneumonia or heart failure. Moderate dextroscoliosis unchanged. IMPRESSION: Chronic pleural scarring right lung base. No acute cardiopulmonary abnormality. Electronically Signed   By: Franchot Gallo M.D.   On: 08/08/2020 14:40   CT Head Wo Contrast  Result Date: 08/08/2020 CLINICAL DATA:  Altered mental status. EXAM: CT HEAD WITHOUT CONTRAST TECHNIQUE: Contiguous axial images were obtained from the base of the skull through the vertex without intravenous contrast. COMPARISON:  April 25, 2020 FINDINGS: Brain: There is mild cerebral atrophy with widening of the extra-axial spaces and ventricular dilatation. There are areas of decreased attenuation within the white  matter tracts of the supratentorial brain, consistent with microvascular disease changes. Vascular: No hyperdense vessel or unexpected calcification. Skull: Normal. Negative for fracture or focal lesion. Sinuses/Orbits: There is marked severity left maxillary sinus mucosal thickening. Other: None. IMPRESSION: 1. Generalized cerebral atrophy. 2. No acute intracranial abnormality. 3. Left maxillary sinus disease. Electronically Signed   By: Virgina Norfolk M.D.   On: 08/08/2020 15:21   CT Angio Chest PE W and/or Wo Contrast  Result Date: 08/08/2020 CLINICAL DATA:  Altered mental status. EXAM: CT ANGIOGRAPHY CHEST WITH CONTRAST TECHNIQUE: Multidetector CT imaging of the chest was performed using the standard protocol during bolus administration of intravenous contrast. Multiplanar CT image reconstructions and MIPs were obtained to evaluate the vascular anatomy. CONTRAST:  61mL OMNIPAQUE IOHEXOL 350 MG/ML SOLN COMPARISON:  March 17, 2016 FINDINGS: Cardiovascular: There is moderate severity calcification of the thoracic aorta without evidence of aneurysmal dilatation. A small area of intraluminal low attenuation is seen within a posteromedial subsegmental lower lobe branch  of the right pulmonary artery (axial CT images 67 through 70, CT series number 4). Normal heart size. No pericardial effusion. Mediastinum/Nodes: No enlarged mediastinal, hilar, or axillary lymph nodes. Thyroid gland, trachea, and esophagus demonstrate no significant findings. Lungs/Pleura: Lungs are clear. No pleural effusion or pneumothorax. Upper Abdomen: There is marked severity intrahepatic biliary dilatation. Musculoskeletal: Degenerative changes seen throughout the thoracic spine. Review of the MIP images confirms the above findings. IMPRESSION: 1. Small amount of pulmonary embolism within a posteromedial subsegmental lower lobe branch of the right pulmonary artery. 2. Marked severity intrahepatic biliary dilatation. Correlation with liver function tests is recommended. Aortic Atherosclerosis (ICD10-I70.0). Electronically Signed   By: Virgina Norfolk M.D.   On: 08/08/2020 15:30    ____________________________________________   PROCEDURES  Procedure(s) performed (including Critical Care):  .1-3 Lead EKG Interpretation Performed by: Jacqueline [place], MD Authorized by: Jacqueline Banning, MD     Interpretation: normal     ECG rate:  70s    ECG rate assessment: normal     Rhythm: sinus rhythm     Ectopy: none     Conduction: normal    .Critical Care Performed by: Jacqueline Sombrillo, MD Authorized by: Jacqueline Corrigan, MD   Critical care provider statement:    Critical care time (minutes):  35   Critical care was necessary to treat or prevent imminent or life-threatening deterioration of the following conditions: PE requring heparin    Critical care was time spent personally by me on the following activities:  Discussions with consultants, evaluation of patient's response to treatment, examination of patient, ordering and performing treatments and interventions, ordering and review of laboratory studies, ordering and review of radiographic studies, pulse oximetry, re-evaluation of patient's  condition, obtaining history from patient or surrogate and review of old charts     ____________________________________________   INITIAL IMPRESSION / [place] / ED COURSE  STAYSHA TRUBY was evaluated in Emergency Department on 08/08/2020 for the symptoms described in the history of present illness. She was evaluated in the context of the global COVID-19 pandemic, which necessitated consideration that the patient might be at risk for infection with the SARS-CoV-2 virus that causes COVID-19. Institutional protocols and algorithms that pertain to the evaluation of patients at risk for COVID-19 are in a state of rapid change based on information released by regulatory bodies including the CDC and federal and state organizations. These policies and algorithms were followed during the patient's care in the ED.  Patient is a 85 year old who comes in with transient altered mental status.  Appears to be at baseline at this time.  Labs were ordered evaluate for [place] abnormalities, AKI, chest x-ray to evaluate for pneumonia, urine to evaluate for UTI.  Will get CT head evaluate for intercranial hemorrhage given the concern for shortness of breath patient does endorse as well as oxygen levels of 94% will get CT PE to evaluate for pulmonary embolism.  Given concern for LOC we will keep patient on cardiac monitor to evaluate for arrhythmia  Kidney function around baseline from 3 weeks ago.  T bili slightly elevated but she has no right upper quadrant tenderness.  UA without evidence of infection.  Troponin slightly elevated which could be from arrhythmia but no evidence so far on EKG or cardiac monitoring.  CT head negative   Discussed with daughter who is now at bedside.  Daughter states that she has had some episodes where for a few minutes she stares off into space where she still breathing but otherwise is completely nonresponsive.  This is been the longest episode of 20 minutes.  She  denies her ever having a seizure work-up.  They are wanting to have further work-up for this.  Will discuss with the hospital team for admission for syncope versus seizure work-up  Patient CT PE was positive for PE.  We will start patient on heparin discussed the hospital team for admission.  ____________________________________________   FINAL CLINICAL IMPRESSION(S) / ED DIAGNOSES   Final diagnoses:  Other pulmonary embolism without acute cor pulmonale, unspecified chronicity (HCC)  Altered awareness, transient      MEDICATIONS GIVEN DURING THIS VISIT:  Medications  heparin ADULT infusion 100 units/mL (25000 units/246mL) (750 Units/hr Intravenous New Bag/Given 08/08/20 1629)  iohexol (OMNIPAQUE) 350 MG/ML injection 60 mL (60 mLs Intravenous Contrast Given 08/08/20 1452)  heparin bolus via infusion 3,000 Units (3,000 Units Intravenous Bolus from Bag 08/08/20 1629)     ED Discharge Orders    None       Note:  This document was prepared using Dragon voice recognition software and may include unintentional dictation errors.   Jacqueline [place], MD 08/08/20 [phone]

## 2020-08-08 NOTE — Progress Notes (Signed)
ANTICOAGULATION CONSULT NOTE  Pharmacy Consult for heparin infusion Indication: pulmonary embolus  Patient Measurements: Height: 5\' 2"  (157.5 cm) Weight: 45.4 kg (100 lb) IBW/kg (Calculated) : 50.1  Vital Signs: Temp: 97.6 F (36.4 C) (03/03 1338) Temp Source: Axillary (03/03 1338) BP: 151/96 (03/03 1554) Pulse Rate: 64 (03/03 1554)  Labs: Recent Labs    08/08/20 1350  HGB 15.6*  HCT 45.1  PLT 146*  CREATININE 1.25*  TROPONINIHS 23*    Estimated Creatinine Clearance: 24.9 mL/min (A) (by C-G formula based on SCr of 1.25 mg/dL (H)).   Medical History: Past Medical History:  Diagnosis Date  . Asthma   . Collapsed lung   . Dementia ([place])   . Diabetes mellitus without complication ([place])    Patient takes Insulin twice a day.   Marland Kitchen GERD (gastroesophageal reflux disease)   . Hyperlipidemia   . Hypertension   . Hypothyroidism   . Parkinson's disease ([place])   . Reactive airway disease     Assessment: 85 y.o. female with dementia, Parkinson's, hypertension, hyperlipidemia who comes in with altered mental status. CT angio shows a small amount of pulmonary embolism within a posteromedial subsegmental lower lobe branch of the right pulmonary artery.According to dispense records she is on no chronic anticoagulation prior to arrival. INR, aPTT baseline lab ordered but not yet resulted.  Goal of Therapy:  Heparin level 0.3-0.7 units/ml Monitor platelets by anticoagulation protocol: Yes   Plan:  Give 3000 units bolus x 1 Start heparin infusion at 750 units/hr Check anti-Xa level in 8 hours and daily while on heparin Continue to monitor H&H and platelets  Dallie Piles 08/08/2020,4:14 PM

## 2020-08-08 NOTE — ED Notes (Signed)
[place], MD at bedside.

## 2020-08-08 NOTE — ED Notes (Signed)
Daughter at bedside updated on POC. Comfort items provided to daughter. Lights dimmed for patient comfort.

## 2020-08-09 ENCOUNTER — Inpatient Hospital Stay: Payer: Medicare HMO

## 2020-08-09 ENCOUNTER — Inpatient Hospital Stay
Admit: 2020-08-09 | Discharge: 2020-08-09 | Disposition: A | Discharge status: Home / Self Care | Payer: Medicare HMO | Attending: Internal Medicine | Admitting: Internal Medicine

## 2020-08-09 DIAGNOSIS — M25552 Pain in left hip: Secondary | ICD-10-CM

## 2020-08-09 DIAGNOSIS — G9341 Metabolic encephalopathy: Secondary | ICD-10-CM | POA: Diagnosis not present

## 2020-08-09 DIAGNOSIS — I2699 Other pulmonary embolism without acute cor pulmonale: Secondary | ICD-10-CM | POA: Diagnosis not present

## 2020-08-09 DIAGNOSIS — E1122 Type 2 diabetes mellitus with diabetic chronic kidney disease: Secondary | ICD-10-CM | POA: Diagnosis not present

## 2020-08-09 DIAGNOSIS — R55 Syncope and collapse: Secondary | ICD-10-CM

## 2020-08-09 DIAGNOSIS — N183 Chronic kidney disease, stage 3 unspecified: Secondary | ICD-10-CM

## 2020-08-09 DIAGNOSIS — G20 Parkinson's disease: Secondary | ICD-10-CM

## 2020-08-09 LAB — CBC
HCT: 39.1 % (ref 36.0–46.0)
HCT: 39.4 % (ref 36.0–46.0)
Hemoglobin: 13.6 g/dL (ref 12.0–15.0)
Hemoglobin: 13.6 g/dL (ref 12.0–15.0)
MCH: 30.6 pg (ref 26.0–34.0)
MCH: 30.5 pg (ref 26.0–34.0)
MCHC: 34.8 g/dL (ref 30.0–36.0)
MCHC: 34.5 g/dL (ref 30.0–36.0)
MCV: 87.9 fL (ref 80.0–100.0)
MCV: 88.3 fL (ref 80.0–100.0)
Platelets: 172 10*3/uL (ref 150–400)
Platelets: 176 10*3/uL (ref 150–400)
RBC: 4.45 MIL/uL (ref 3.87–5.11)
RBC: 4.46 MIL/uL (ref 3.87–5.11)
RDW: 12.7 % (ref 11.5–15.5)
RDW: 12.8 % (ref 11.5–15.5)
WBC: 4.7 10*3/uL (ref 4.0–10.5)
WBC: 5 10*3/uL (ref 4.0–10.5)
nRBC: 0 % (ref 0.0–0.2)
nRBC: 0 % (ref 0.0–0.2)

## 2020-08-09 LAB — HEPARIN LEVEL (UNFRACTIONATED)
Heparin Unfractionated: 1.42 [IU]/mL — ABNORMAL HIGH (ref 0.30–0.70)
Heparin Unfractionated: 0.87 IU/mL — ABNORMAL HIGH (ref 0.30–0.70)
Heparin Unfractionated: 0.34 IU/mL (ref 0.30–0.70)

## 2020-08-09 LAB — GLUCOSE, CAPILLARY
Glucose-Capillary: 315 mg/dL — ABNORMAL HIGH (ref 70–99)
Glucose-Capillary: 313 mg/dL — ABNORMAL HIGH (ref 70–99)

## 2020-08-09 LAB — ECHOCARDIOGRAM COMPLETE
AR max vel: 2 cm2
AV Area VTI: 2.47 cm2
AV Area mean vel: 2.13 cm2
AV Mean grad: 2 mmHg
AV Peak grad: 4 mmHg
Ao pk vel: 1 m/s
Area-P 1/2: 3.37 cm2
Height: 62 in
MV VTI: 3.8 cm2
S' Lateral: 2.4 cm
Weight: 1600 oz

## 2020-08-09 LAB — HEPATIC FUNCTION PANEL
ALT: 8 U/L (ref 0–44)
AST: 16 U/L (ref 15–41)
Albumin: 3.4 g/dL — ABNORMAL LOW (ref 3.5–5.0)
Alkaline Phosphatase: 65 U/L (ref 38–126)
Bilirubin, Direct: 0.2 mg/dL (ref 0.0–0.2)
Indirect Bilirubin: 1 mg/dL — ABNORMAL HIGH (ref 0.3–0.9)
Total Bilirubin: 1.2 mg/dL (ref 0.3–1.2)
Total Protein: 5.6 g/dL — ABNORMAL LOW (ref 6.5–8.1)

## 2020-08-09 LAB — CBG MONITORING, ED
Glucose-Capillary: 159 mg/dL — ABNORMAL HIGH (ref 70–99)
Glucose-Capillary: 192 mg/dL — ABNORMAL HIGH (ref 70–99)

## 2020-08-09 LAB — BASIC METABOLIC PANEL
Anion gap: 11 (ref 5–15)
BUN: 43 mg/dL — ABNORMAL HIGH (ref 8–23)
CO2: 26 mmol/L (ref 22–32)
Calcium: 8.8 mg/dL — ABNORMAL LOW (ref 8.9–10.3)
Chloride: 99 mmol/L (ref 98–111)
Creatinine, Ser: 1.19 mg/dL — ABNORMAL HIGH (ref 0.44–1.00)
GFR, Estimated: 46 mL/min — ABNORMAL LOW (ref >60)
Glucose, Bld: 203 mg/dL — ABNORMAL HIGH (ref 70–99)
Potassium: 3.1 mmol/L — ABNORMAL LOW (ref 3.5–5.1)
Sodium: 136 mmol/L (ref 135–145)

## 2020-08-09 LAB — MAGNESIUM: Magnesium: 1.9 mg/dL (ref 1.7–2.4)

## 2020-08-09 MED ORDER — HEPARIN (PORCINE) 25000 UT/250ML-% IV SOLN
400.0000 [IU]/h | INTRAVENOUS | Status: DC
  Administered 2020-08-09: 500 [IU]/h via INTRAVENOUS
  Filled 2020-08-09: qty 250

## 2020-08-09 MED ORDER — INSULIN ASPART 100 UNIT/ML ~~LOC~~ SOLN
0.0000 [IU] | Freq: Every day | SUBCUTANEOUS | Status: DC
  Administered 2020-08-09: 4 [IU] via SUBCUTANEOUS
  Filled 2020-08-09: qty 1

## 2020-08-09 MED ORDER — POTASSIUM CHLORIDE CRYS ER 20 MEQ PO TBCR
40.0000 meq | EXTENDED_RELEASE_TABLET | ORAL | Status: AC
  Administered 2020-08-09 (×2): 40 meq via ORAL
  Filled 2020-08-09 (×2): qty 2

## 2020-08-09 NOTE — Consult Note (Signed)
NEUROLOGY CONSULTATION NOTE   Date of service: August 09, 2020 Patient Name: Jacqueline [name] MRN:  [id number] DOB:  [birth year] Reason for consult: "episodes of AMS" _ _ _   _ __   _ __ _ _  __ __   _ __   __ _  History of Present Illness  Jacqueline [name] is a 85 y.o. female with PMH significant for Parkinson's dementia, diabetes, reflux disease, hypertension, hyperlipidemia, hypothyroidism who presents with episodes of AMS triggered by getting up.   Most of the history provided by daughter who lives with the patient and is her care taker. Reports that patient carries a diagnosis of parkinson's disease for 7-8 years. She follows with Dr. Melrose Nakayama at [facility]. Daughter also notes progressive decline over the course of her diagnosis with her becoming more slow and it affecting her memory. She reports that Jacqueline [name] has been having these episodes of AMS for a while. They typically happen she she is sat up in the bed so they try to raise her up slowly. Family is careful about letting her sit at the edge of her bed for a while before they would have her hold onto them and then lift her up to get her to stand up or get into her wheel chair. She reports that more recently, these episodes of AMS have been getting longer and yesterday, she had a 20 mins episode. She has never had such a long episode in the past so the care taker called EMS.  Patient describes these episodes as dizziness, lightheadedness, when she tries to get her or when they raise her up.  Daughter reports that she was supposed to be started on Entacapone but they did not start her on it for concern that her hallucinations will worsen.  Daughter reports that Damiah's appetite at home was poor, she typically eats a fourth of her meal.   ROS   Ms. Jacqueline [name] denies any pain but otherwise unable to obtain a reliable ROS given her dementia and slowed thought process  Past History   Past Medical History:  Diagnosis Date  . Asthma   . Collapsed  lung   . Dementia ([place])   . Diabetes mellitus without complication ([place])    Patient takes Insulin twice a day.   Marland Kitchen GERD (gastroesophageal reflux disease)   . Hyperlipidemia   . Hypertension   . Hypothyroidism   . Parkinson's disease ([place])   . Reactive airway disease    Past Surgical History:  Procedure Laterality Date  . ABDOMINAL HYSTERECTOMY    . CERUMEN REMOVAL Bilateral 09/15/2019   Procedure: CERUMEN REMOVAL;  Surgeon: Beverly Gust, MD;  Location: [facility];  Service: ENT;  Laterality: Bilateral;  Diabetic - insulin  . CHOLECYSTECTOMY    . COLONOSCOPY WITH PROPOFOL N/A 03/29/2015   Procedure: COLONOSCOPY WITH PROPOFOL;  Surgeon: Hulen Luster, MD;  Location: [facility] ENDOSCOPY;  Service: Gastroenterology;  Laterality: N/A;  . ESOPHAGOGASTRODUODENOSCOPY (EGD) WITH PROPOFOL N/A 03/29/2015   Procedure: ESOPHAGOGASTRODUODENOSCOPY (EGD) WITH PROPOFOL;  Surgeon: Hulen Luster, MD;  Location: [facility] ENDOSCOPY;  Service: Gastroenterology;  Laterality: N/A;  . HIP PINNING,CANNULATED Left 07/30/2019   Procedure: CANNULATED HIP PINNING;  Surgeon: Corky Mull, MD;  Location: [facility] ORS;  Service: Orthopedics;  Laterality: Left;  . lung collapse     broken rib from fall punctured lung   Family History  Problem Relation Age of Onset  . Bladder Cancer Neg Hx   . Kidney cancer Neg Hx  Social History   Socioeconomic History  . Marital status: Widowed    Spouse name: Not on file  . Number of children: Not on file  . Years of education: Not on file  . Highest education level: Not on file  Occupational History  . Not on file  Tobacco Use  . Smoking status: Never Smoker  . Smokeless tobacco: Never Used  Vaping Use  . Vaping Use: Never used  Substance and Sexual Activity  . Alcohol use: Not Currently  . Drug use: No  . Sexual activity: Not on file  Other Topics Concern  . Not on file  Social History Narrative  . Not on file   Social Determinants of Health   Financial Resource  Strain: Not on file  Food Insecurity: Not on file  Transportation Needs: Not on file  Physical Activity: Not on file  Stress: Not on file  Social Connections: Not on file   No Known Allergies  Medications  (Not in a hospital admission)    Vitals   Vitals:   08/09/20 0600 08/09/20 0730 08/09/20 0800 08/09/20 0830  BP: 116/71 119/73 133/75 130/85  Pulse: 61 63 70 76  Resp: 13 10 15 17   Temp:      TempSrc:      SpO2: 98% 99% 98% 98%  Weight:      Height:         Body mass index is 18.29 kg/m.  Physical Exam   General: Appears deconditioned with thin extremities. laying comfortably in bed; in no acute distress. Smiles throughout the visit. HENT: Normal oropharynx and mucosa. Normal external appearance of ears and nose. Neck: Supple, no pain or tenderness CV: No JVD. No peripheral edema. Pulmonary: Symmetric Chest rise. Normal respiratory effort. Abdomen: Soft to touch, non-tender. Ext: No cyanosis, edema, or deformity Skin: No rash. Normal palpation of skin.  Musculoskeletal: Normal digits and nails by inspection. No clubbing.  Neurologic Examination  Mental status/Cognition: Alert, oriented to self, place, poor attention. Bradyphrenic. Speech/language: Hypophonia, fluent, comprehension intact to simple commands, object naming intact. Cranial nerves:   CN II Pupils equal and sluggish reaction to light, no VF deficits.   CN III,IV,VI EOM intact, no gaze preference or deviation, no nystagmus   CN V normal sensation in V1, V2, and V3 segments bilaterally   CN VII no asymmetry, no nasolabial fold flattening. Hypomimia.   CN VIII normal hearing to speech   CN IX & X normal palatal elevation, no uvular deviation   CN XI    CN XII midline tongue protrusion   Motor:  Muscle bulk: poor, tone paratonia, pronator drift none tremor yes, action and at rest. Also has dyskinetic movements of her head and hands. Mvmt Root Nerve  Muscle Right Left Comments  SA C5/6 Ax Deltoid      EF C5/6 Mc Biceps 5 5   EE C6/7/8 Rad Triceps 5 5   WF C6/7 Med FCR     WE C7/8 PIN ECU     F Ab C8/T1 U ADM/FDI 5 5   HF L1/2/3 Fem Illopsoas 5 5   KE L2/3/4 Fem Quad     DF L4/5 D Peron Tib Ant 5 5   PF S1/2 Tibial Grc/Sol 5 5    Reflexes:  Right Left Comments  Pectoralis      Biceps (C5/6) 1 1   Brachioradialis (C5/6) 1 1    Triceps (C6/7) 1 1    Patellar (L3/4) 0 0  Achilles (S1) 0 0    Hoffman      Plantar mute up   Jaw jerk    Sensation:  Light touch Intact throughout   Pin prick    Temperature    Vibration   Proprioception    Coordination/Complex Motor:  - Finger to Nose intact BL - Heel to shin unable to do. - Rapid alternating movement are slowed. - Gait: did not assess.  Labs   CBC:  Recent Labs  Lab 08/08/20 1350 08/09/20 0200 08/09/20 0428  WBC 4.2 4.7 5.0  NEUTROABS 2.9  --   --   HGB 15.6* 13.6 13.6  HCT 45.1 39.1 39.4  MCV 87.9 87.9 88.3  PLT 146* 172 101    Basic Metabolic Panel:  Lab Results  Component Value Date   NA 136 08/09/2020   K 3.1 (L) 08/09/2020   CO2 26 08/09/2020   GLUCOSE 203 (H) 08/09/2020   BUN 43 (H) 08/09/2020   CREATININE 1.19 (H) 08/09/2020   CALCIUM 8.8 (L) 08/09/2020   GFRNONAA 46 (L) 08/09/2020   GFRAA >60 01/29/2020   Lipid Panel: No results found for: LDLCALC HgbA1c:  Lab Results  Component Value Date   HGBA1C 8.3 (H) 08/08/2020   Urine Drug Screen: No results found for: LABOPIA, COCAINSCRNUR, LABBENZ, AMPHETMU, THCU, LABBARB  Alcohol Level No results found for: [place]  CT Head without contrast: I reviewed CTH which was was negative for a large hypodensity concerning for a large territory infarct or hyperdensity concerning for an ICH. Notable for generalized volume loss and atrophy.  CT Angio chest: Notable for a subsegmental PE.  rEEG:  This study is suggestive of mild to moderate diffuse encephalopathy, nonspecific etiology.  No seizures or epileptiform discharges were seen throughout the  recording.  Impression   KALLE BERNATH is a 85 y.o. female with PMH significant for advanced parkisons disease with memory impairment who presents with events of AMS triggered exclusively by "getting up" that have been becoming more frequent and longer. She had a 20 mins episode which she has never had before. Her neurologic examination is consistent with her hx of parkinson's dementia but no focal deficit otherwise.  The fact that these events occur with her attempting to get up or when she is pulled up, makes me think that these are likely orthostatic in origin. Patient does endorse some symptoms that would fit in with this including dizziness and lightheadedness when she tries to get up. I think part of this might be due to dehydration from poor PO intake which probably will make her orthostatis worse. Less likely to be seizure  Recommendations  - Agree with midodrine 2.5mg  daily. I think its a good idea that this is given about 15 mins before she is ready to sit or stand up. - Recommend TSH and Cortisol-AM levels specially with her hx of hypothyroidism. - Recommend orthostatic vitals x 1 with HR and BP with patient lying completely flat and then with her sitting up with her legs dangling from the edge of the bed. - I would recommend starting with non pharmacological recs for orthostatic hypotension as listed below. We can start with thigh high compression stocking here along with an abdominal binder.  - Non-Pharmacologic Recs for Orthostatic Hypotension 1) Lifestyle modification. These measures include:  - Arising slowly, in stages, from supine to seated to standing. This maneuver is most important in the morning, when orthostatic tolerance is lowest.  - Avoiding straining, coughing, and walking in hot  weather.These activities reduce venous return and worsen orthostatic hypotension.  - Maintaining hydration and avoiding over-heating.  - Raising the head of the bed 30 to 45 degrees decreases  renal perfusion, thereby activating the renin-angiotensin-aldosterone system and decreasing nocturnal diuresis, which can be pronounced in these patients.  These changes relieve orthostatic hypotension by expanding extracellular fluid volume and may reduce end organ damage by reducing supine HTN.  2) Exercise - walking 30 minutes a day, exercise in a swimming pool, exercise in a recumbent or seated position (using a stationary bike or rowing machine)  3) Abdominal binders   4) Compression Stockings  5) Increased salt and water intake to 2 L to 2.5 L of water a day  6) Modification of meals:  - Avoiding large meals  - Ingesting meals low in carbohydrates  - Alcohol should be avoided during the day as it is a vasodilator  - Drink water with meals  - Avoiding activities or sudden standing immediately after eating  7) Physical Countermaneuvers during daily activities: leg crossing, standing on tip toes, squatting  ______________________________________________________________________   Thank you for the opportunity to take part in the care of this patient. If you have any further questions, please contact the neurology consultation attending.  Signed,  [place] Pager Number [phone] _ _ _   _ __   _ __ _ _  __ __   _ __   __ _

## 2020-08-09 NOTE — Progress Notes (Signed)
*  PRELIMINARY RESULTS* Echocardiogram 2D Echocardiogram has been performed.  Jacqueline [name] 08/09/2020, 11:05 AM

## 2020-08-09 NOTE — Progress Notes (Signed)
   ANTICOAGULATION CONSULT NOTE  Pharmacy Consult for heparin infusion Indication: pulmonary embolus  Patient Measurements: Height: 5\' 2"  (157.5 cm) Weight: 45.4 kg (100 lb) IBW/kg (Calculated) : 50.1  Vital Signs: BP: 119/76 (03/04 1000) Pulse Rate: 79 (03/04 1000)  Labs: Recent Labs    08/08/20 1350 08/08/20 1555 08/08/20 1615 08/09/20 0200 08/09/20 0428 08/09/20 1133  HGB 15.6*  --   --  13.6 13.6  --   HCT 45.1  --   --  39.1 39.4  --   PLT 146*  --   --  172 176  --   APTT  --   --  26  --   --   --   LABPROT  --   --  14.5  --   --   --   INR  --   --  1.2  --   --   --   HEPARINUNFRC  --   --   --  1.42*  --  0.87*  CREATININE 1.25*  --   --   --  1.19*  --   TROPONINIHS 23* 19*  --   --   --   --     Estimated Creatinine Clearance: 26.1 mL/min (A) (by C-G formula based on SCr of 1.19 mg/dL (H)).   Medical History: Past Medical History:  Diagnosis Date  . Asthma   . Collapsed lung   . Dementia ([place])   . Diabetes mellitus without complication ([place])    Patient takes Insulin twice a day.   Marland Kitchen GERD (gastroesophageal reflux disease)   . Hyperlipidemia   . Hypertension   . Hypothyroidism   . Parkinson's disease ([place])   . Reactive airway disease     Assessment: 85 y.o. female with dementia, Parkinson's, hypertension, hyperlipidemia who comes in with altered mental status. CT angio shows a small amount of pulmonary embolism within a posteromedial subsegmental lower lobe branch of the right pulmonary artery.According to dispense records she is on no chronic anticoagulation prior to arrival. INR, aPTT baseline lab ordered but not yet resulted.   0304 0200 HL 1.42, SUPRAtherapeutic.  D/W nurse, no problems with lab draw.  Will hold heparin x 1 hour then resume at 500 units/hr  Goal of Therapy:  Heparin level 0.3-0.7 units/ml Monitor platelets by anticoagulation protocol: Yes   Plan:  Give 3000 units bolus x 1 Start heparin infusion at 750 units/hr Check  anti-Xa level in 8 hours and daily while on heparin Continue to monitor H&H and platelets    0304 1133 HL 0.87, SUPRAtherapeutic .  Hgb 13.6  Plt 176.  Will decrease heparin to 400 units/hr and recheck HL ~ 8 hrs. F/u CBC in am  Ledarius Leeson A 08/09/2020,12:17 PM

## 2020-08-09 NOTE — Progress Notes (Signed)
PROGRESS NOTE  Jacqueline [name] JSH:[id number] DOB: 21-Aug-1935 DOA: 08/08/2020 PCP: Lavera Guise, MD  Brief History    66yow w/ Parkinson's disease, dementia w/ acute on chronic transient mental status change. Back to baseline in ED but admitted for possible syncope vs seizure, found to have small PE.  A & P  Acute encephalopathy metabolic, syncope vs seizure, chronic transient mental status changes resolving in 3-5 minute. Per chart daughter concerned for more frequent and longer lasting episodes. Previously seen in ED in last 4 weeks for same and found to be hypoglycemic at that time. Urinalysis and CXR unrevealing. CT head no acute intracranial abnormality --Back to baseline per daughter.  After further history obtained from the daughter, suspect autonomic instability with resulting postural induced syncope.  CT head no acute abnormalities.  Work-up thus far unrevealing. --follow-up echocardiogram, EEG, TSH, I50, folic acid WNL, neurology consult.  No history obtained today suggests seizure. --monitor on telemetry, no history of conduction disturbance but will d/c Aricept --Very small PE noncontributory --Continue midodrine  Acute PE --small on right side, started on heparin --check BLE venous dopplers --Very high fall risk.  Discussed risk/benefit of various treatments including oral anticoagulation, IVC filter placement and no treatment.  Daughter processing, will follow up and ask palliative to assist as well.  Isolated elevated t bili, new since 07/17/20. CTA chest showed marked severity intrahepatic biliary dilatation.  --CT 2016 showed stable dilated intrahepatic and extrahepatic ducts to ampulla, stable since 2014, most consistent w/ postsurgical change. CT 03/2016 showed dilatation of bilary tree similar to 2016. --Repeat total bilirubin within normal limits today.  No further evaluation suggested.  Chronic left maxillary sinus disease on head CT. Seen on previous CTs 04/2020, 01/2020,  07/2019 --No further evaluation suggested.  Parkinson's disease --continue Sinemet, amantadine  Mixed Alzheimer's and vascular dementia --continue memantine, rasagiline  DM type 2 on insulin Hgb A1c 8.3 with CKD stage IIIa --no hypoglycemia. Continue Basaglar  Hypothyroidism --continue Synthroid, TSH WNL  Functional quadriplegia secondary to Parkinson's disease, dementia, requires assistance with ADLs and frequent turning  Aortic atherosclerosis  --No treatment indicated  Skin Assessment: I have examined the patient's skin and I agree with the wound assessment as performed by the wound care RN as outlined below: Pressure Injury 07/29/19 Sacrum Lower Stage 2 -  Partial thickness loss of dermis presenting as a shallow open injury with a red, pink wound bed without slough. very outermost skin missing, no redness or drainage (Active)  07/29/19 2115  Location: Sacrum  Location Orientation: Lower  Staging: Stage 2 -  Partial thickness loss of dermis presenting as a shallow open injury with a red, pink wound bed without slough.  Wound Description (Comments): very outermost skin missing, no redness or drainage  Present on Admission: Yes   Disposition Plan:  Discussion: Complex case as above.  Will involve palliative medicine to assist with goals of care in particular regard to anticoagulation versus IVC filter versus no treatment  Status is: Inpatient  Remains inpatient appropriate because:IV treatments appropriate due to intensity of illness or inability to take PO and Inpatient level of care appropriate due to severity of illness   Dispo: The patient is from: Home              Anticipated d/c is to: Home              Patient currently is not medically stable to d/c.   Difficult to place patient No  DVT prophylaxis: heparin  gtt   Code Status: Full Code per daughter today at bedside Level of care: Progressive Cardiac Family Communication: discussed with daughter in detail at  bedside  Murray Hodgkins, MD  [facility] Direct contact: see [email or web address] (further directions at bottom of note if needed) 7PM-7AM contact night coverage as at bottom of note 08/09/2020, 10:11 AM  LOS: 1 day   Significant Hospital Events   .    Consults:  . Neurology    Procedures:  .   Significant Diagnostic Tests:  Marland Kitchen    Micro Data:  .    Antimicrobials:  .   Interval History/Subjective  CC: f/u syncope  Patient back to baseline per daughter bedside.  Has advanced dementia and cannot provide any history. Lives with daughter, patient very high fall risk with frequent episodes of syncope with position change, followed by Dr. Melrose Nakayama neurology as an outpatient.  Daughter reports every time sitting up patient passes out.  Had an episode yesterday where she was unresponsive for about 20 minutes and not prompted emergency department visit. Requires assistance with all ADLs. Very poor appetite, eats little. Has been referred to outpatient palliative.  Patient does report hip pain on the left side.  It is unclear whether she actually fell yesterday or was caught after passing out.  Objective   Vitals:  Vitals:   08/09/20 0800 08/09/20 0830  BP: 133/75 130/85  Pulse: 70 76  Resp: 15 17  Temp:    SpO2: 98% 98%    Exam:  Constitutional:   . Appears calm and comfortable lying on stretcher in the emergency department Eyes:  . pupils and irises appear normal ENMT:  . grossly normal hearing  . Lips appear normal . Tongue appears normal Respiratory:  . CTA bilaterally, no w/r/r.  . Respiratory effort normal.  Cardiovascular:  . RRR, no m/r/g . No LE extremity edema   Abdomen:  . Thin, soft, ntnd Musculoskeletal:  . Very thin with decreased muscle mass.  Perhaps some pain with palpation of the left hip. Skin:  . No rashes, lesions, ulcers noted over left hip Neurologic:  . Difficult to assess but no obvious abnormalities Psychiatric:  . Mental  status . Mood and affect difficult to assess   I have personally reviewed the following:   Today's Data  . CBG stable . CMP unremarkable.  Total bilirubin has normalized.  Neck CBC unremarkable.  Scheduled Meds: . atorvastatin  5 mg Oral Daily  . carbidopa-levodopa  1 tablet Oral Daily   And  . carbidopa-levodopa  0.5 tablet Oral TID  . Carbidopa-Levodopa ER  1 tablet Oral QHS  . cholecalciferol  500 Units Oral Daily  . fluticasone  1 spray Each Nare Daily  . insulin aspart  0-9 Units Subcutaneous TID WC  . levothyroxine  25 mcg Oral QAC breakfast  . lidocaine  1 patch Transdermal Q12H  . melatonin  5 mg Oral QHS  . memantine  10 mg Oral BID  . mometasone-formoterol  2 puff Inhalation BID  . multivitamin with minerals  1 tablet Oral Daily  . pantoprazole  40 mg Oral Daily  . rasagiline  1 mg Oral Daily  . sodium chloride flush  3 mL Intravenous Q12H   Continuous Infusions: . sodium chloride    . heparin 500 Units/hr (08/09/20 0430)    Principal Problem:   AMS (altered mental status) Active Problems:   Type 2 diabetes mellitus with stage 3 chronic kidney disease (HCC)   Essential hypertension  Parkinson's disease ([place])   Acquired hypothyroidism   Dementia due to Parkinson's disease without behavioral disturbance ([place])   Pulmonary embolism ([place])   LOS: 1 day   How to contact the [facility] Attending or Consulting provider [place] or covering provider during after hours [place], for this patient?  1. Check the care team in [facility] and look for a) attending/consulting [facility] provider listed and b) the [facility] team listed 2. Log into [email or web address] and use [place]'s universal password to access. If you do not have the password, please contact the hospital operator. 3. Locate the [facility] provider you are looking for under [facility] and page to a number that you can be directly reached. 4. If you still have difficulty reaching the provider, please page the [facility] (Director on Call) for  the Hospitalists listed on amion for assistance.

## 2020-08-09 NOTE — ED Notes (Signed)
2A contacted at this time and RN states pt can be transported up. Pt being transported at this time

## 2020-08-09 NOTE — Progress Notes (Signed)
Portable EEG completed, results pending. 

## 2020-08-09 NOTE — Procedures (Signed)
Patient Name: [name]  MRN: [id number]  Epilepsy Attending: Lora Havens  Referring Physician/Provider: Dr Collier Bullock Date: 08/09/2020 Duration: 22.51 mins  Patient history: 85 year old female with history of dementia presented with altered mental status and possible syncope versus seizure.  EEG evaluate for seizures.  Level of alertness: Awake  AEDs during EEG study: None  Technical aspects: This EEG study was done with scalp electrodes positioned according to the 10-20 International system of electrode placement. Electrical activity was acquired at a sampling rate of 500Hz  and reviewed with a high frequency filter of 70Hz  and a low frequency filter of 1Hz . EEG data were recorded continuously and digitally stored.   Description: The posterior dominant rhythm consists of 6 Hz activity of moderate voltage (25-35 uV) seen predominantly in posterior head regions, symmetric and reactive to eye opening and eye closing. EEG showed continuousgeneralized 5 to 6 Hz theta slowing.Hyperventilation and photic stimulation were not performed.    ABNORMALITY -Continuous slow, generalized -Background slow  IMPRESSION: This study is suggestive of mild to moderate diffuse encephalopathy, nonspecific etiology.  No seizures or epileptiform discharges were seen throughout the recording.  Narcissa Melder Barbra Sarks

## 2020-08-09 NOTE — Progress Notes (Signed)
   ANTICOAGULATION CONSULT NOTE  Pharmacy Consult for heparin infusion Indication: pulmonary embolus  Patient Measurements: Height: 5\' 2"  (157.5 cm) Weight: 45.4 kg (100 lb) IBW/kg (Calculated) : 50.1  Vital Signs: BP: 102/65 (03/04 0300) Pulse Rate: 72 (03/04 0315)  Labs: Recent Labs    08/08/20 1350 08/08/20 1555 08/08/20 1615 08/09/20 0200  HGB 15.6*  --   --  13.6  HCT 45.1  --   --  39.1  PLT 146*  --   --  172  APTT  --   --  26  --   LABPROT  --   --  14.5  --   INR  --   --  1.2  --   HEPARINUNFRC  --   --   --  1.42*  CREATININE 1.25*  --   --   --   TROPONINIHS 23* 19*  --   --     Estimated Creatinine Clearance: 24.9 mL/min (A) (by C-G formula based on SCr of 1.25 mg/dL (H)).   Medical History: Past Medical History:  Diagnosis Date  . Asthma   . Collapsed lung   . Dementia ([place])   . Diabetes mellitus without complication ([place])    Patient takes Insulin twice a day.   Marland Kitchen GERD (gastroesophageal reflux disease)   . Hyperlipidemia   . Hypertension   . Hypothyroidism   . Parkinson's disease ([place])   . Reactive airway disease     Assessment: 85 y.o. female with dementia, Parkinson's, hypertension, hyperlipidemia who comes in with altered mental status. CT angio shows a small amount of pulmonary embolism within a posteromedial subsegmental lower lobe branch of the right pulmonary artery.According to dispense records she is on no chronic anticoagulation prior to arrival. INR, aPTT baseline lab ordered but not yet resulted.  Goal of Therapy:  Heparin level 0.3-0.7 units/ml Monitor platelets by anticoagulation protocol: Yes   Plan:  Give 3000 units bolus x 1 Start heparin infusion at 750 units/hr Check anti-Xa level in 8 hours and daily while on heparin Continue to monitor H&H and platelets    0304 0200 HL 1.42, SUPRAtherapeutic.  D/W nurse, no problems with lab draw.  Will hold heparin x 1 hour then resume at 500 units/hr and recheck HL ~ 6 hrs after  restart.  Hart Robinsons A 08/09/2020,3:31 AM

## 2020-08-09 NOTE — Progress Notes (Signed)
   ANTICOAGULATION CONSULT NOTE  Pharmacy Consult for heparin infusion Indication: pulmonary embolus  Patient Measurements: Height: 5\' 2"  (157.5 cm) Weight: 45.4 kg (100 lb) IBW/kg (Calculated) : 50.1  Vital Signs: Temp: 98.7 F (37.1 C) (03/04 2004) Temp Source: Oral (03/04 2004) BP: 127/71 (03/04 2004) Pulse Rate: 76 (03/04 2004)  Labs: Recent Labs    08/08/20 1350 08/08/20 1555 08/08/20 1615 08/09/20 0200 08/09/20 0428 08/09/20 1133 08/09/20 2118  HGB 15.6*  --   --  13.6 13.6  --   --   HCT 45.1  --   --  39.1 39.4  --   --   PLT 146*  --   --  172 176  --   --   APTT  --   --  26  --   --   --   --   LABPROT  --   --  14.5  --   --   --   --   INR  --   --  1.2  --   --   --   --   HEPARINUNFRC  --   --   --  1.42*  --  0.87* 0.34  CREATININE 1.25*  --   --   --  1.19*  --   --   TROPONINIHS 23* 19*  --   --   --   --   --     Estimated Creatinine Clearance: 26.1 mL/min (A) (by C-G formula based on SCr of 1.19 mg/dL (H)).   Medical History: Past Medical History:  Diagnosis Date  . Asthma   . Collapsed lung   . Dementia ([place])   . Diabetes mellitus without complication ([place])    Patient takes Insulin twice a day.   Marland Kitchen GERD (gastroesophageal reflux disease)   . Hyperlipidemia   . Hypertension   . Hypothyroidism   . Parkinson's disease ([place])   . Reactive airway disease     Assessment: 85 y.o. female with dementia, Parkinson's, hypertension, hyperlipidemia who comes in with altered mental status. CT angio shows a small amount of pulmonary embolism within a posteromedial subsegmental lower lobe branch of the right pulmonary artery.According to dispense records she is on no chronic anticoagulation prior to arrival. INR, aPTT baseline lab ordered but not yet resulted. H/H and plts WNL.  Date Time HL Rate/comment 0304 0200 1.42 750 units/hr; SUPRAtherapeutic 0304 1133 0.87 500 units/hr; SUPRAtherapeutic 0304 2118 0.34 400 units/hr; therapeutic   Goal of  Therapy:  Heparin level 0.3-0.7 units/ml Monitor platelets by anticoagulation protocol: Yes   Plan:  HL therapeutic (0.34), continue heparin infusion at 400 units/hr Recheck HL in 8 hours and daily while on heparin Monitor daily CBC and s/s of bleed  Darnelle Bos, PharmD 08/09/2020,10:10 PM

## 2020-08-09 NOTE — ED Notes (Signed)
Patient transported to inpatient bed by this RN on monitor. Daughter at bedside.

## 2020-08-09 NOTE — Consult Note (Addendum)
[facility] Nurse Consult Note: Reason for Consult: Consult requested for sacrum.  Daughter at bedside to assess bilat buttocks and sacrum.  She states pt previous had a pressure injury which has healed to right lower buttocks.  The scar tissue/skin is darker red and intact to this site.  Pt is very emeciated with protruding bones over sacrum. She is currently in the ED on a stretcher. Wound type: Stage 1 pressure injury to sacrum/left buttocks; approx 6X6cm Pressure Injury POA: Yes Dressing procedure/placement/frequency: Turned pt off the affected area and repositioned with pillows. Topical treatment orders provided for bedside nurses to perform as follows to protect from further injury: Foam dressing to sacrum/buttocks, change Q 3 days or PRN soiling. Please re-consult if further assistance is needed.  Thank-you,  Julien Girt MSN, [place], [place], [place], [place]

## 2020-08-09 NOTE — ED Notes (Signed)
Provider at bedside, tranport to 2A delayed at this time

## 2020-08-09 NOTE — ED Notes (Signed)
eeg being completed at this time

## 2020-08-09 NOTE — Hospital Course (Addendum)
71yow w/ Parkinson's disease, dementia w/ acute on chronic transient mental status change. Back to baseline in ED but admitted for possible syncope vs seizure, found to have small PE.  A & P  Acute encephalopathy metabolic, syncope vs seizure, chronic transient mental status changes resolving in 3-5 minute. Per chart daughter concerned for more frequent and longer lasting episodes. Previously seen in ED in last 4 weeks for same and found to be hypoglycemic at that time. Urinalysis and CXR unrevealing. CT head no acute intracranial abnormality --Back to baseline per daughter.  After further history obtained from the daughter, suspect autonomic instability with resulting postural induced syncope.  CT head no acute abnormalities.  Work-up thus far unrevealing. --follow-up echocardiogram, EEG, TSH, U63, folic acid WNL, neurology consult.  No history obtained today suggests seizure. --monitor on telemetry, no history of conduction disturbance but will d/c Aricept --Very small PE noncontributory --Continue midodrine  Acute PE --small on right side, started on heparin --check BLE venous dopplers --Very high fall risk.  Discussed risk/benefit of various treatments including oral anticoagulation, IVC filter placement and no treatment.  Daughter processing, will follow up and ask palliative to assist as well.  Isolated elevated t bili, new since 07/17/20. CTA chest showed marked severity intrahepatic biliary dilatation.  --CT 2016 showed stable dilated intrahepatic and extrahepatic ducts to ampulla, stable since 2014, most consistent w/ postsurgical change. CT 03/2016 showed dilatation of bilary tree similar to 2016. --Repeat total bilirubin within normal limits today.  No further evaluation suggested.  Chronic left maxillary sinus disease on head CT. Seen on previous CTs 04/2020, 01/2020, 07/2019 --No further evaluation suggested.  Parkinson's disease --continue Sinemet, amantadine  Mixed Alzheimer's and  vascular dementia --continue memantine, rasagiline  DM type 2 on insulin Hgb A1c 8.3 with CKD stage IIIa --no hypoglycemia. Continue Basaglar  Hypothyroidism --continue Synthroid, TSH WNL  Functional quadriplegia secondary to Parkinson's disease, dementia, requires assistance with ADLs and frequent turning  Aortic atherosclerosis  --No treatment indicated

## 2020-08-09 NOTE — ED Notes (Signed)
inforned RN bed assigned 1323

## 2020-08-10 DIAGNOSIS — I2699 Other pulmonary embolism without acute cor pulmonale: Secondary | ICD-10-CM | POA: Diagnosis not present

## 2020-08-10 DIAGNOSIS — R55 Syncope and collapse: Secondary | ICD-10-CM | POA: Diagnosis not present

## 2020-08-10 LAB — HEPARIN LEVEL (UNFRACTIONATED): Heparin Unfractionated: 0.31 IU/mL (ref 0.30–0.70)

## 2020-08-10 LAB — CBC
HCT: 42.6 % (ref 36.0–46.0)
Hemoglobin: 14.4 g/dL (ref 12.0–15.0)
MCH: 30.4 pg (ref 26.0–34.0)
MCHC: 33.8 g/dL (ref 30.0–36.0)
MCV: 90.1 fL (ref 80.0–100.0)
Platelets: 165 10*3/uL (ref 150–400)
RBC: 4.73 MIL/uL (ref 3.87–5.11)
RDW: 12.6 % (ref 11.5–15.5)
WBC: 5.6 10*3/uL (ref 4.0–10.5)
nRBC: 0 % (ref 0.0–0.2)

## 2020-08-10 LAB — GLUCOSE, CAPILLARY
Glucose-Capillary: 173 mg/dL — ABNORMAL HIGH (ref 70–99)
Glucose-Capillary: 281 mg/dL — ABNORMAL HIGH (ref 70–99)

## 2020-08-10 LAB — CORTISOL-AM, BLOOD: Cortisol - AM: 18.8 ug/dL (ref 6.7–22.6)

## 2020-08-10 MED ORDER — APIXABAN 5 MG PO TABS: 5.0000 mg | ORAL_TABLET | Freq: Two times a day (BID) | ORAL | Status: DC

## 2020-08-10 MED ORDER — GLUCERNA SHAKE PO LIQD
237.0000 mL | Freq: Three times a day (TID) | ORAL | Status: DC
  Administered 2020-08-10: 237 mL via ORAL

## 2020-08-10 MED ORDER — APIXABAN (ELIQUIS) VTE STARTER PACK (10MG AND 5MG): ORAL_TABLET | ORAL | 0 refills | Status: AC

## 2020-08-10 MED ORDER — JUVEN PO PACK: 1.0000 | PACK | Freq: Two times a day (BID) | ORAL | Status: DC

## 2020-08-10 MED ORDER — APIXABAN (ELIQUIS) EDUCATION KIT FOR DVT/PE PATIENTS
PACK | Freq: Once | Status: AC
  Filled 2020-08-10: qty 1

## 2020-08-10 MED ORDER — APIXABAN 5 MG PO TABS
10.0000 mg | ORAL_TABLET | Freq: Two times a day (BID) | ORAL | Status: DC
  Administered 2020-08-10: 10 mg via ORAL
  Filled 2020-08-10: qty 2

## 2020-08-10 NOTE — Progress Notes (Signed)
Initial Nutrition Assessment  DOCUMENTATION CODES:   Underweight (Suspect PCM)  INTERVENTION:  Glucerna Shake po TID, each supplement provides 220 kcal and 10 grams of protein  Juven BID, each packet provides 95 calories, 2.5 grams of protein (collagen), and 9.8 grams of carbohydrate (3 grams sugar); also contains 7 grams of L-arginine and L-glutamine, 300 mg vitamin C, 15 mg vitamin E, 1.2 mcg vitamin B-12, 9.5 mg zinc, 200 mg calcium, and 1.5 g  Calcium Beta-hydroxy-Beta-methylbutyrate to support wound healing  Continue MVI with minerals daily  Feeding assistance with meals  Pt is at increased risk for refeeding given reported poor appetite and oral intake, recommend monitoring magnesium, potassium, and phosphorus daily for at least 3 days, MD to replete as needed.   NUTRITION DIAGNOSIS:   Inadequate oral intake related to poor appetite as evidenced by per patient/family report,percent weight loss.    GOAL:   Patient will meet greater than or equal to 90% of their needs    MONITOR:   PO intake,Supplement acceptance,Labs,I & O's,Skin,Weight trends  REASON FOR ASSESSMENT:   Malnutrition Screening Tool    ASSESSMENT:  85 year old female admitted for syncope and found to have small right sided PE. Past medical history significant of functional quadriplegia secondary to Parkinson's disease, dementia requires assistance with ADL's, hypothyroidism, HTN, GERD, and IDDM presented with transient mental status changes.  RD working remotely.  EEG study with no seizures or epileptiform seen throughout recording.   Pt with advanced dementia, unable to obtain nutrition history. No documented intakes for review at this time, noted daughter of pt reports very poor appetite and little intake. Will order Glucerna Shake to help her meet her needs as well as Juven to support wound healing of noted stage I pressure injury present on admission. She is underweight and per chart, weights have  decreased ~8 lbs (8.5%) in the last 3 weeks; significant. Given trends, advanced age with progressive Parkinson's, dementia highly suspect malnutrition, however unable to identify at this time. Will complete exam at follow-up.   Medications reviewed and include: Sinemet, D3, SSI, Melatonin, MVI, Protonix, IV Heparin  Labs: CBGs 173,313, K 3.1 (L), BUN 43 (H), Cr 1.19 (H), Mg 1.9 (WNL)   NUTRITION - FOCUSED PHYSICAL EXAM:  Unable to complete at this time  Diet Order:   Diet Order            Diet Carb Modified Fluid consistency: Thin; Room service appropriate? Yes  Diet effective now                 EDUCATION NEEDS:   Not appropriate for education at this time  Skin:  Skin Assessment: Skin Integrity Issues: Skin Integrity Issues:: Stage I Stage I: sacrum  Last BM:  3/3  Height:   Ht Readings from Last 1 Encounters:  08/08/20 5\' 2"  (1.575 m)    Weight:   Wt Readings from Last 1 Encounters:  08/10/20 39 kg    BMI:  Body mass index is 15.71 kg/m.  Estimated Nutritional Needs:   Kcal:  1350-1500  Protein:  58-66  Fluid:  >1 L/day   Lajuan Lines, RD, LDN Clinical Nutrition After Hours/Weekend Pager # in [place]

## 2020-08-10 NOTE — Progress Notes (Signed)
Mobility Specialist - Progress Note   08/10/20 1229  Mobility  Activity Dangled on edge of bed  Range of Motion/Exercises Right leg;Left leg (AP, ABD, SLR)  Level of Assistance Moderate assist, patient does 50-74%  Assistive Device None  Distance Ambulated (ft) 0 ft  Mobility Response Tolerated well  Mobility performed by Mobility specialist  $Mobility charge 1 Mobility    Supine BP: 132/80 (89) EOB BP: 132/80 (89) Post-Activity EOB: 91/69 (76)   Pt was lying in bed upon arrival utilizing room air. Daughter at bedside. Pt agreed to session. Pt denied pain, nausea, and fatigue. Pt does have eyes closed throughout most of session. Pt's orthostatics were obtained prior to activity, recorded above. Pt was able to get EOB with modA, pt's LE bent at knee and slightly restrictive with poor carryover on commands. Upon sitting, pt c/o dizziness and reports seeing "black dots" with no change in BP measurements. Pt with good sitting-balance, tolerating upright position for ~5 minutes. Pt participated in seated therex: ankle pumps x10, abduction x10, and straight leg raises x10 with modA for proper technique. Pt does not fully extend BLE for proper form to complete straight leg raises. Pt reports dizziness "is about the same" but states she is no longer seeing black dots. Noted drop in seated BP after activity. Pt returned supine with minA, and repositioned to [facility]. Pt denies dizziness with position change. Overall, pt tolerated session well. Pt was left in bed with all needs in reach and alarm set. MD entered at the end of session.    Kathee Delton Mobility Specialist 08/10/20, 12:42 PM

## 2020-08-10 NOTE — Progress Notes (Signed)
Patient discharged at this time to home with daughters with all belongings. Ted hose and abdominal binder supplies given as well as Mepilex dressing per AVS/MD orders. Eliqiuis card and teachings provided per MD orders. Daughters verbalized understanding of discharge instructions/teachings per AVS. PIV and tele removed by Probation officer. NAD noted upon departure.

## 2020-08-10 NOTE — Discharge Summary (Signed)
Physician Discharge Summary  Jacqueline [name]:[id number] DOB: 1935/09/19 DOA: 08/08/2020  PCP: Lavera Guise, MD  Admit date: 08/08/2020 Discharge date: 08/10/2020  Recommendations for Outpatient Follow-up:  Acute encephalopathy metabolic, secondary to vasovagal syncope/autonomic instability, chronic transient mental status changes resolving in 3-5 minute. Per chart daughter concerned for more frequent and longer lasting episodes.Urinalysis and CXR unrevealing. CT head no acute intracranial abnormality --stopped Aricept, see below --recommend thigh high compression stocking and abdominal binder  Acute PE --Very high fall risk.  Discussed risk/benefit of various treatments including oral anticoagulation, IVC filter placement and no treatment. After discussing risk and benefits both daughters elect to begin apixaban.   Follow-up Information    Anabel Bene, MD. Schedule an appointment as soon as possible for a visit in 2 week(s).   Specialty: Neurology Contact information: [facility] [place] [address] [phone]        Lavera Guise, MD. Schedule an appointment as soon as possible for a visit in 1 week(s).   Specialty: Internal Medicine Why: follow-up blood clot Contact information: [place] [address] [phone]                Discharge Diagnoses: Principal diagnosis is #1 Principal Problem:   Syncope Active Problems:   Type 2 diabetes mellitus with stage 3 chronic kidney disease (HCC)   Essential hypertension   Parkinson's disease (HCC)   Pressure injury of sacral region, stage 1   Acquired hypothyroidism   Dementia due to Parkinson's disease without behavioral disturbance ([place])   Pulmonary embolism (HCC)   Acute metabolic encephalopathy   Discharge Condition: improved Disposition: home  Diet recommendation:  Diet Orders (From admission, onward)    Start     Ordered   08/10/20 0000   Diet general        08/10/20 1511   08/08/20 1729  Diet Carb Modified Fluid consistency: Thin; Room service appropriate? Yes  Diet effective now       Question Answer Comment  Diet-HS Snack? Nothing   Calorie Level Medium 1600-2000   Fluid consistency: Thin   Room service appropriate? Yes      08/08/20 1729           Filed Weights   08/08/20 1340 08/10/20 0513  Weight: 45.4 kg 39 kg    HPI/Hospital Course:   85yow w/ Parkinson's disease, dementia w/ acute on chronic transient mental status change. Back to baseline in ED but admitted for possible syncope vs seizure, found to have small PE. After further investigation and discussion, it was felt this was autonomic dysfunction/orthostasis which has been a longstanding problem for her. CT head no acute abnormalities and EEG was nonspecific. No reason to suspect seizure or acute CNS event. Patient returned quickly to baseline. Extensive discussion with both daughters, reviewed testing and laboratory studies. Discussed risk/benefit of various treatment options for PE including anticoagulation, IVC filter placement and no treatment. After weighing the risks and benefits, both daughters elected to start the patient on oral anticoagulation. Patient is extremely well cared for at home and never moves without assistance. They understand the patient is a high fall risk and the risk of catastrophic bleeding is increased with falls, however they will accept this risk and wish to proceed with treatment.  Acute encephalopathy metabolic, secondary to vasovagal syncope/autonomic instability, chronic transient mental status changes resolving in 3-5 minute. Per chart daughter concerned for more frequent and longer lasting episodes.Urinalysis and CXR unrevealing.  CT head no acute intracranial abnormality --CT head unremarkable, EEG nonspecific, LVEF 26-83%, grade 1 diastolic dysfunction. Normal RV systolic function. Serum cortisol morning within normal limits.  TSH within normal limits. --Telemetry unrevealing. Aricept discontinued.discussion with family, although I do not think Aricept is likely to have resulted in any of her events, nevertheless given potential to confuse the picture, they agree with stopping this medication. --Very small PE noncontributory --Continue midodrine  Acute PE --small on right side, started on heparin --Bilateral venous Dopplers lower extremities negative. --Very high fall risk.  Discussed risk/benefit of various treatments including oral anticoagulation, IVC filter placement and no treatment. After discussing risk and benefits both daughters elect to begin apixaban.  Isolated elevated t bili, new since 07/17/20. CTA chest showed marked severity intrahepatic biliary dilatation.  --CT 2016 showed stable dilated intrahepatic and extrahepatic ducts to ampulla, stable since 2014, most consistent w/ postsurgical change. CT 03/2016 showed dilatation of bilary tree similar to 2016. --Repeat total bilirubin within normal limits today.  No further evaluation suggested.  Chronic left maxillary sinus disease on head CT. Seen on previous CTs 04/2020, 01/2020, 07/2019 --No further evaluation suggested.  Parkinson's disease --continue Sinemet, amantadine  Mixed Alzheimer's and vascular dementia --continue memantine, rasagiline  DM type 2 on insulin Hgb A1c 8.3 with CKD stage IIIa --no hypoglycemia. Continue Basaglar  Hypothyroidism --continue Synthroid, TSH WNL  Functional quadriplegia secondary to Parkinson's disease, dementia, requires assistance with ADLs and frequent turning  Aortic atherosclerosis  --No treatment indicated    Consults:  . Neurology   Today's assessment: S: CC: f/u syncope  Feeling well no complaints Daughter at bedside, pt better, would like to take her home  O: Vitals:  Vitals:   08/10/20 0803 08/10/20 1211  BP: 121/61 130/74  Pulse: 66 70  Resp: 16 16  Temp: 97.9 F (36.6 C) 98 F (36.7  C)  SpO2: 97% 98%    Constitutional:  . Appears calm and comfortable, more alert today ENMT:  . grossly normal hearing  Respiratory:  . CTA bilaterally, no w/r/r.  . Respiratory effort normal.  Cardiovascular:  . RRR, no m/r/g . No LE extremity edema   Psychiatric:  o Confused, awake, speech clear   Discharge Instructions  Discharge Instructions    Diet general   Complete by: As directed    Discharge instructions   Complete by: As directed    Call your physician or seek immediate medical attention for bleeding, prolonged passing out, new confusion, lethargy or worsening of condition. Prevent falls. Assist will all movements.   We can start with thigh high compression stocking here along with an abdominal binder.  - Non-Pharmacologic RecsforOrthostatic Hypotension 1) Lifestyle modification. These measures include:             - Arising slowly, in stages, from supine to seated to standing. This maneuver is most important in the morning, when orthostatic tolerance is lowest.             - Avoiding straining, coughing, and walking in hot weather.These activities reduce venous return and worsen orthostatic hypotension.             - Maintaining hydration and avoiding over-heating.             - Raising the head of the bed 30 to 45 degrees                        3) Abdominal binders  4)Compression Stockings             5) Increased salt and water intake to 2 L to 2.5 L of water a day             6) Modification of meals:             - Avoiding large meals             - Ingesting meals low in carbohydrates             - Drink water with meals             - Avoiding activities or sudden standing immediately after eating             7) Physical Countermaneuvers during daily activities: leg crossing, standing on tip toes, squatting   Discharge wound care:   Complete by: As directed    Foam dressing to sacrum/buttocks, change every 3 days or as needed for soiling      Allergies as of 08/10/2020   No Known Allergies     Medication List    STOP taking these medications   donepezil 10 MG tablet Commonly known as: ARICEPT     TAKE these medications   Accu-Chek FastClix Lancets Misc Once a day   amantadine 100 MG capsule Commonly known as: SYMMETREL Take 100 mg by mouth daily.   Apixaban Starter Pack (10mg  and 5mg ) Commonly known as: ELIQUIS STARTER PACK Take as directed on package: start with two-5mg  tablets twice daily for 7 days. On day 8, switch to one-5mg  tablet twice daily.   ascorbic acid 500 MG tablet Commonly known as: VITAMIN C Take 500 mg by mouth daily.   atorvastatin 10 MG tablet Commonly known as: LIPITOR TAKE A HALF TABLET BY MOUTH EVERY DAY What changed:   how much to take  how to take this  when to take this   Basaglar KwikPen 100 UNIT/ML Inject 16 Units into the skin daily.   Calcium-Vitamin D-Vitamin K 481-856-31 MG-UNT-MCG Chew Chew by mouth.   carbidopa-levodopa 25-100 MG tablet Commonly known as: SINEMET IR Take 0.5-1 tablets by mouth See admin instructions. Take 1 tablet by mouth at 10 am, take 1/2 tablet by mouth at 12 pm,take 1/2 tablet at 2 pm, take 1/2 tablet at 4 pm, take 1/2 tablet at 6 pm and 1 tablet at 8 pm   Carbidopa-Levodopa ER 25-100 MG tablet controlled release Commonly known as: SINEMET CR Take 1 tablet by mouth at bedtime.   cholecalciferol 25 MCG (1000 UNIT) tablet Commonly known as: VITAMIN D3 Take 500 Units by mouth daily.   fluticasone 50 MCG/ACT nasal spray Commonly known as: FLONASE Place 1 spray into the nose daily.   levothyroxine 50 MCG tablet Commonly known as: SYNTHROID Take 25 mcg by mouth daily before breakfast.   memantine 10 MG tablet Commonly known as: NAMENDA Take 10 mg by mouth 2 (two) times daily.   midodrine 2.5 MG tablet Commonly known as: PROAMATINE Take 2.5 mg by mouth daily with breakfast.   omeprazole 40 MG capsule Commonly known as: PRILOSEC TAKE  1 CAPSULE BY MOUTH EVERY DAY What changed: how much to take   rasagiline 1 MG Tabs tablet Commonly known as: AZILECT Take 1 mg by mouth daily.   Symbicort 80-4.5 MCG/ACT inhaler Generic drug: budesonide-formoterol TAKE 2 PUFFS BY MOUTH TWICE A DAY            Discharge Care Instructions  (From admission,  onward)         Start     Ordered   08/10/20 0000  Discharge wound care:       Comments: Foam dressing to sacrum/buttocks, change every 3 days or as needed for soiling   08/10/20 1511         No Known Allergies  The results of significant diagnostics from this hospitalization (including imaging, microbiology, ancillary and laboratory) are listed below for reference.    Significant Diagnostic Studies: DG Chest 2 View  Result Date: 08/08/2020 CLINICAL DATA:  Short of breath EXAM: CHEST - 2 VIEW COMPARISON:  04/24/2019 FINDINGS: Heart size and vascularity normal. Negative for heart failure. Mild right basilar pleural scarring unchanged. Negative for pneumonia or heart failure. Moderate dextroscoliosis unchanged. IMPRESSION: Chronic pleural scarring right lung base. No acute cardiopulmonary abnormality. Electronically Signed   By: Franchot Gallo M.D.   On: 08/08/2020 14:40   CT Head Wo Contrast  Result Date: 08/08/2020 CLINICAL DATA:  Altered mental status. EXAM: CT HEAD WITHOUT CONTRAST TECHNIQUE: Contiguous axial images were obtained from the base of the skull through the vertex without intravenous contrast. COMPARISON:  April 25, 2020 FINDINGS: Brain: There is mild cerebral atrophy with widening of the extra-axial spaces and ventricular dilatation. There are areas of decreased attenuation within the white matter tracts of the supratentorial brain, consistent with microvascular disease changes. Vascular: No hyperdense vessel or unexpected calcification. Skull: Normal. Negative for fracture or focal lesion. Sinuses/Orbits: There is marked severity left maxillary sinus mucosal  thickening. Other: None. IMPRESSION: 1. Generalized cerebral atrophy. 2. No acute intracranial abnormality. 3. Left maxillary sinus disease. Electronically Signed   By: Virgina Norfolk M.D.   On: 08/08/2020 15:21   CT Angio Chest PE W and/or Wo Contrast  Result Date: 08/08/2020 CLINICAL DATA:  Altered mental status. EXAM: CT ANGIOGRAPHY CHEST WITH CONTRAST TECHNIQUE: Multidetector CT imaging of the chest was performed using the standard protocol during bolus administration of intravenous contrast. Multiplanar CT image reconstructions and MIPs were obtained to evaluate the vascular anatomy. CONTRAST:  62mL OMNIPAQUE IOHEXOL 350 MG/ML SOLN COMPARISON:  March 17, 2016 FINDINGS: Cardiovascular: There is moderate severity calcification of the thoracic aorta without evidence of aneurysmal dilatation. A small area of intraluminal low attenuation is seen within a posteromedial subsegmental lower lobe branch of the right pulmonary artery (axial CT images 67 through 70, CT series number 4). Normal heart size. No pericardial effusion. Mediastinum/Nodes: No enlarged mediastinal, hilar, or axillary lymph nodes. Thyroid gland, trachea, and esophagus demonstrate no significant findings. Lungs/Pleura: Lungs are clear. No pleural effusion or pneumothorax. Upper Abdomen: There is marked severity intrahepatic biliary dilatation. Musculoskeletal: Degenerative changes seen throughout the thoracic spine. Review of the MIP images confirms the above findings. IMPRESSION: 1. Small amount of pulmonary embolism within a posteromedial subsegmental lower lobe branch of the right pulmonary artery. 2. Marked severity intrahepatic biliary dilatation. Correlation with liver function tests is recommended. Aortic Atherosclerosis (ICD10-I70.0). Electronically Signed   By: Virgina Norfolk M.D.   On: 08/08/2020 15:30   US Venous Img Lower Bilateral (DVT)  Result Date: 08/09/2020 CLINICAL DATA:  Acute PE, shortness of breath. EXAM: BILATERAL  LOWER EXTREMITY VENOUS DOPPLER ULTRASOUND TECHNIQUE: Gray-scale sonography with graded compression, as well as color Doppler and duplex ultrasound were performed to evaluate the lower extremity deep venous systems from the level of the common femoral vein and including the common femoral, femoral, profunda femoral, popliteal and calf veins including the posterior tibial, peroneal and gastrocnemius veins when  visible. The superficial great saphenous vein was also interrogated. Spectral Doppler was utilized to evaluate flow at rest and with distal augmentation maneuvers in the common femoral, femoral and popliteal veins. COMPARISON:  None. FINDINGS: RIGHT LOWER EXTREMITY Common Femoral Vein: No evidence of thrombus. Normal compressibility, respiratory phasicity and response to augmentation. Saphenofemoral Junction: No evidence of thrombus. Normal compressibility and flow on color Doppler imaging. Profunda Femoral Vein: No evidence of thrombus. Normal compressibility and flow on color Doppler imaging. Femoral Vein: No evidence of thrombus. Normal compressibility, respiratory phasicity and response to augmentation. Popliteal Vein: No evidence of thrombus. Normal compressibility, respiratory phasicity and response to augmentation. Calf Veins: No evidence of thrombus. Normal compressibility and flow on color Doppler imaging. Superficial Great Saphenous Vein: No evidence of thrombus. Normal compressibility. Venous Reflux:  None. LEFT LOWER EXTREMITY Common Femoral Vein: No evidence of thrombus. Normal compressibility, respiratory phasicity and response to augmentation. Saphenofemoral Junction: No evidence of thrombus. Normal compressibility and flow on color Doppler imaging. Profunda Femoral Vein: No evidence of thrombus. Normal compressibility and flow on color Doppler imaging. Femoral Vein: No evidence of thrombus. Normal compressibility, respiratory phasicity and response to augmentation. Popliteal Vein: No evidence of  thrombus. Normal compressibility, respiratory phasicity and response to augmentation. Calf Veins: No evidence of thrombus. Normal compressibility and flow on color Doppler imaging. Superficial Great Saphenous Vein: No evidence of thrombus. Normal compressibility. Venous Reflux:  None. IMPRESSION: No evidence of deep venous thrombosis in either lower extremity. Electronically Signed   By: Margaretha Sheffield MD   On: 08/09/2020 12:12   EEG adult  Result Date: 08/09/2020 Lora Havens, MD     08/09/2020  3:00 PM Patient Name: Jacqueline [name] MRN: [id number] Epilepsy Attending: Lora Havens Referring Physician/Provider: Dr Collier Bullock Date: 08/09/2020 Duration: 22.51 mins Patient history: 85 year old female with history of dementia presented with altered mental status and possible syncope versus seizure.  EEG evaluate for seizures. Level of alertness: Awake AEDs during EEG study: None Technical aspects: This EEG study was done with scalp electrodes positioned according to the 10-20 International system of electrode placement. Electrical activity was acquired at a sampling rate of 500Hz  and reviewed with a high frequency filter of 70Hz  and a low frequency filter of 1Hz . EEG data were recorded continuously and digitally stored. Description: The posterior dominant rhythm consists of 6 Hz activity of moderate voltage (25-35 uV) seen predominantly in posterior head regions, symmetric and reactive to eye opening and eye closing. EEG showed continuousgeneralized 5 to 6 Hz theta slowing.Hyperventilation and photic stimulation were not performed.   ABNORMALITY -Continuous slow, generalized -Background slow IMPRESSION: This study is suggestive of mild to moderate diffuse encephalopathy, nonspecific etiology.  No seizures or epileptiform discharges were seen throughout the recording. Lora Havens   ECHOCARDIOGRAM COMPLETE  Result Date: 08/09/2020    ECHOCARDIOGRAM REPORT   Patient Name:   Jacqueline [name] Date of  Exam: 08/09/2020 Medical Rec #:  [id number]       Height:       62.0 in Accession #:    [id number]      Weight:       100.0 lb Date of Birth:  11-09-1935        BSA:          1.424 m Patient Age:    25 years        BP:           116/71 mmHg Patient Gender: F  HR:           76 bpm. Exam Location:  [facility] Procedure: 2D Echo, Color Doppler and Cardiac Doppler Indications:     I26.09 Pulmonary Embolus  History:         Patient has no prior history of Echocardiogram examinations.                  Signs/Symptoms:Dementia; Risk Factors:Hypertension, Diabetes                  and Dyslipidemia.  Sonographer:     Charmayne Sheer RDCS (AE) Referring Phys:  [id number] Collier Bullock Diagnosing Phys: Isaias Cowman MD  Sonographer Comments: Suboptimal apical window. Image acquisition challenging due to patient body habitus. IMPRESSIONS  1. Left ventricular ejection fraction, by estimation, is 55 to 60%. The left ventricle has normal function. The left ventricle has no regional wall motion abnormalities. Left ventricular diastolic parameters are consistent with Grade I diastolic dysfunction (impaired relaxation).  2. Right ventricular systolic function is normal. The right ventricular size is normal.  3. The mitral valve is normal in structure. Trivial mitral valve regurgitation. No evidence of mitral stenosis.  4. The aortic valve is normal in structure. Aortic valve regurgitation is not visualized. No aortic stenosis is present.  5. The inferior vena cava is normal in size with greater than 50% respiratory variability, suggesting right atrial pressure of 3 mmHg. FINDINGS  Left Ventricle: Left ventricular ejection fraction, by estimation, is 55 to 60%. The left ventricle has normal function. The left ventricle has no regional wall motion abnormalities. The left ventricular internal cavity size was normal in size. There is  no left ventricular hypertrophy. Left ventricular diastolic parameters are consistent with Grade I  diastolic dysfunction (impaired relaxation). Right Ventricle: The right ventricular size is normal. No increase in right ventricular wall thickness. Right ventricular systolic function is normal. Left Atrium: Left atrial size was normal in size. Right Atrium: Right atrial size was normal in size. Pericardium: There is no evidence of pericardial effusion. Mitral Valve: The mitral valve is normal in structure. Trivial mitral valve regurgitation. No evidence of mitral valve stenosis. MV peak gradient, 2.1 mmHg. The mean mitral valve gradient is 1.0 mmHg. Tricuspid Valve: The tricuspid valve is normal in structure. Tricuspid valve regurgitation is trivial. No evidence of tricuspid stenosis. Aortic Valve: The aortic valve is normal in structure. Aortic valve regurgitation is not visualized. No aortic stenosis is present. Aortic valve mean gradient measures 2.0 mmHg. Aortic valve peak gradient measures 4.0 mmHg. Aortic valve area, by VTI measures 2.47 cm. Pulmonic Valve: The pulmonic valve was normal in structure. Pulmonic valve regurgitation is not visualized. No evidence of pulmonic stenosis. Aorta: The aortic root is normal in size and structure. Venous: The inferior vena cava is normal in size with greater than 50% respiratory variability, suggesting right atrial pressure of 3 mmHg. IAS/Shunts: No atrial level shunt detected by color flow Doppler.  LEFT VENTRICLE PLAX 2D LVIDd:         3.40 cm  Diastology LVIDs:         2.40 cm  LV e' lateral:   5.55 cm/s LV PW:         1.00 cm  LV E/e' lateral: 9.9 LV IVS:        0.70 cm LVOT diam:     1.90 cm LV SV:         42 LV SV Index:   29 LVOT Area:  2.84 cm  RIGHT VENTRICLE RV Basal diam:  3.10 cm RV Mid diam:    2.70 cm LEFT ATRIUM         Index      RIGHT ATRIUM          Index LA diam:    2.30 cm 1.62 cm/m RA Area:     5.62 cm                                RA Volume:   9.32 ml  6.55 ml/m  AORTIC VALVE                   PULMONIC VALVE AV Area (Vmax):    2.00 cm     PV Vmax:       0.82 m/s AV Area (Vmean):   2.13 cm    PV Vmean:      54.400 cm/s AV Area (VTI):     2.47 cm    PV VTI:        0.138 m AV Vmax:           100.00 cm/s PV Peak grad:  2.7 mmHg AV Vmean:          68.200 cm/s PV Mean grad:  1.0 mmHg AV VTI:            0.169 m AV Peak Grad:      4.0 mmHg AV Mean Grad:      2.0 mmHg LVOT Vmax:         70.50 cm/s LVOT Vmean:        51.200 cm/s LVOT VTI:          0.147 m LVOT/AV VTI ratio: 0.87  AORTA Ao Root diam: 2.70 cm MITRAL VALVE MV Area (PHT): 3.37 cm    SHUNTS MV Area VTI:   3.80 cm    Systemic VTI:  0.15 m MV Peak grad:  2.1 mmHg    Systemic Diam: 1.90 cm MV Mean grad:  1.0 mmHg MV Vmax:       0.73 m/s MV Vmean:      37.3 cm/s MV Decel Time: 225 msec MV E velocity: 54.80 cm/s MV A velocity: 81.40 cm/s MV E/A ratio:  0.67 Isaias Cowman MD Electronically signed by Isaias Cowman MD Signature Date/Time: 08/09/2020/3:11:18 PM    Final    DG HIP UNILAT WITH PELVIS 2-3 VIEWS LEFT  Result Date: 08/09/2020 CLINICAL DATA:  Chronic left hip pain EXAM: DG HIP (WITH OR WITHOUT PELVIS) 2-3V LEFT COMPARISON:  01/29/2020 FINDINGS: Screw fixation of remote and healed left femoral neck fracture. No evidence of complicating osteonecrosis or asymmetric hip joint narrowing. No evidence of acute fracture or bone lesion. IMPRESSION: 1. Remote and healed left femoral neck fracture with ORIF. 2. No acute finding or degenerative hip narrowing. Electronically Signed   By: Monte Fantasia M.D.   On: 08/09/2020 10:56    Microbiology: Recent Results (from the past 240 hour(s))  Resp Panel by RT-PCR (Flu A&B, Covid) Nasopharyngeal Swab     Status: None   Collection Time: 08/08/20  3:54 PM   Specimen: Nasopharyngeal Swab; Nasopharyngeal(NP) swabs in vial transport medium  Result Value Ref Range Status   SARS Coronavirus 2 by RT PCR NEGATIVE NEGATIVE Final    Comment: (NOTE) SARS-CoV-2 target nucleic acids are NOT DETECTED.  The SARS-CoV-2 RNA is generally detectable in  upper respiratory specimens during the acute phase  of infection. The lowest concentration of SARS-CoV-2 viral copies this assay can detect is 138 copies/mL. A negative result does not preclude SARS-Cov-2 infection and should not be used as the sole basis for treatment or other patient management decisions. A negative result may occur with  improper specimen collection/handling, submission of specimen other than nasopharyngeal swab, presence of viral mutation(s) within the areas targeted by this assay, and inadequate number of viral copies(<138 copies/mL). A negative result must be combined with clinical observations, patient history, and epidemiological information. The expected result is Negative.  Fact Sheet for Patients:  EntrepreneurPulse.com.au  Fact Sheet for Healthcare Providers:  IncredibleEmployment.be  This test is no t yet approved or cleared by the [place] FDA and  has been authorized for detection and/or diagnosis of SARS-CoV-2 by FDA under an Emergency Use Authorization (EUA). This EUA will remain  in effect (meaning this test can be used) for the duration of the COVID-19 declaration under Section 564(b)(1) of the Act, 21 U.S.C.section 360bbb-3(b)(1), unless the authorization is terminated  or revoked sooner.       Influenza A by PCR NEGATIVE NEGATIVE Final   Influenza B by PCR NEGATIVE NEGATIVE Final    Comment: (NOTE) The Xpert Xpress SARS-CoV-2/FLU/RSV plus assay is intended as an aid in the diagnosis of influenza from Nasopharyngeal swab specimens and should not be used as a sole basis for treatment. Nasal washings and aspirates are unacceptable for Xpert Xpress SARS-CoV-2/FLU/RSV testing.  Fact Sheet for Patients: EntrepreneurPulse.com.au  Fact Sheet for Healthcare Providers: IncredibleEmployment.be  This test is not yet approved or cleared by the [place] FDA and has been  authorized for detection and/or diagnosis of SARS-CoV-2 by FDA under an Emergency Use Authorization (EUA). This EUA will remain in effect (meaning this test can be used) for the duration of the COVID-19 declaration under Section 564(b)(1) of the Act, 21 U.S.C. section 360bbb-3(b)(1), unless the authorization is terminated or revoked.  Performed at [facility], [address] [place], [place] [address]      Labs: Basic Metabolic Panel: Recent Labs  Lab 08/08/20 1350 08/09/20 0428  NA 137 136  K 4.1 3.1*  CL 97* 99  CO2 26 26  GLUCOSE 155* 203*  BUN 38* 43*  CREATININE 1.25* 1.19*  CALCIUM 9.5 8.8*  MG  --  1.9   Liver Function Tests: Recent Labs  Lab 08/08/20 1350 08/09/20 0428  AST 29 16  ALT 5 8  ALKPHOS 78 65  BILITOT 2.2* 1.2  PROT 7.0 5.6*  ALBUMIN 4.1 3.4*   CBC: Recent Labs  Lab 08/08/20 1350 08/09/20 0200 08/09/20 0428 08/10/20 0519  WBC 4.2 4.7 5.0 5.6  NEUTROABS 2.9  --   --   --   HGB 15.6* 13.6 13.6 14.4  HCT 45.1 39.1 39.4 42.6  MCV 87.9 87.9 88.3 90.1  PLT 146* 172 176 165   CBG: Recent Labs  Lab 08/09/20 1227 08/09/20 1741 08/09/20 2100 08/10/20 0758 08/10/20 1216  GLUCAP 192* 315* 313* 173* 281*    Principal Problem:   Syncope Active Problems:   Type 2 diabetes mellitus with stage 3 chronic kidney disease ([place])   Essential hypertension   Parkinson's disease ([place])   Pressure injury of sacral region, stage 1   Acquired hypothyroidism   Dementia due to Parkinson's disease without behavioral disturbance ([place])   Pulmonary embolism (HCC)   Acute metabolic encephalopathy   Time coordinating discharge: 35 minutes  Signed:  Murray Hodgkins, MD  [facility]  08/10/2020, 3:17 PM

## 2020-08-10 NOTE — Progress Notes (Signed)
   ANTICOAGULATION CONSULT NOTE  Pharmacy Consult for heparin infusion Indication: pulmonary embolus  Patient Measurements: Height: 5\' 2"  (157.5 cm) Weight: 39 kg (85 lb 14.4 oz) IBW/kg (Calculated) : 50.1  Vital Signs: Temp: 98.5 F (36.9 C) (03/05 0400) Temp Source: Oral (03/04 2004) BP: 148/137 (03/05 0400) Pulse Rate: 73 (03/05 0405)  Labs: Recent Labs    08/08/20 1350 08/08/20 1350 08/08/20 1555 08/08/20 1615 08/09/20 0200 08/09/20 0428 08/09/20 1133 08/09/20 2118 08/10/20 0519 08/10/20 0549  HGB 15.6*  --   --   --  13.6 13.6  --   --  14.4  --   HCT 45.1  --   --   --  39.1 39.4  --   --  42.6  --   PLT 146*  --   --   --  172 176  --   --  165  --   APTT  --   --   --  26  --   --   --   --   --   --   LABPROT  --   --   --  14.5  --   --   --   --   --   --   INR  --   --   --  1.2  --   --   --   --   --   --   HEPARINUNFRC  --    < >  --   --  1.42*  --  0.87* 0.34  --  0.31  CREATININE 1.25*  --   --   --   --  1.19*  --   --   --   --   TROPONINIHS 23*  --  19*  --   --   --   --   --   --   --    < > = values in this interval not displayed.    Estimated Creatinine Clearance: 22.4 mL/min (A) (by C-G formula based on SCr of 1.19 mg/dL (H)).   Medical History: Past Medical History:  Diagnosis Date  . Asthma   . Collapsed lung   . Dementia ([place])   . Diabetes mellitus without complication ([place])    Patient takes Insulin twice a day.   Marland Kitchen GERD (gastroesophageal reflux disease)   . Hyperlipidemia   . Hypertension   . Hypothyroidism   . Parkinson's disease ([place])   . Reactive airway disease     Assessment: 85 y.o. female with dementia, Parkinson's, hypertension, hyperlipidemia who comes in with altered mental status. CT angio shows a small amount of pulmonary embolism within a posteromedial subsegmental lower lobe branch of the right pulmonary artery.According to dispense records she is on no chronic anticoagulation prior to arrival. INR, aPTT baseline  lab ordered but not yet resulted. H/H and plts WNL.  Date Time HL Rate/comment 0304 0200 1.42 750 units/hr; SUPRAtherapeutic 0304 1133 0.87 500 units/hr; SUPRAtherapeutic 0304 2118 0.34 400 units/hr; therapeutic  0305    0549    0.31     400 units/hr, therapeutic X 2   Goal of Therapy:  Heparin level 0.3-0.7 units/ml Monitor platelets by anticoagulation protocol: Yes   Plan:  3/5:  HL @ 0549 = 0.31 Will continue pt on current rate and recheck HL on 3/6 with AM labs.   Asheley Hellberg D, PharmD 08/10/2020,6:41 AM

## 2020-08-14 ENCOUNTER — Other Ambulatory Visit: Payer: Self-pay | Admitting: Student

## 2020-08-14 ENCOUNTER — Telehealth: Payer: Self-pay

## 2020-08-14 ENCOUNTER — Other Ambulatory Visit: Payer: Self-pay

## 2020-08-14 DIAGNOSIS — Z515 Encounter for palliative care: Secondary | ICD-10-CM

## 2020-08-14 NOTE — Progress Notes (Signed)
[facility] Consult Note Telephone: [phone]  Fax: [phone]  PATIENT NAME: Jacqueline [name] [place] [address] [id number] (home)  DOB: [birth year] MRN: [id number]  PRIMARY CARE PROVIDER:    Lavera Guise, MD,  [address] [place] [address] [phone]  REFERRING PROVIDER:   Lavera Guise, [place] [place],  [place] [address] [phone]  RESPONSIBLE PARTY:   Extended Emergency Contact Information Primary Emergency Contact: Henreitta Leber [place] of [place] Phone: [phone] Mobile Phone: [phone] Relation: Daughter Secondary Emergency Contact: Sharman Cheek Address: [address]          [place], [place] [address] Johnnette Litter of [place] Phone: [phone] Mobile Phone: [phone] Relation: Daughter  I met face to face with patient and family in home.  ASSESSMENT AND RECOMMENDATIONS:   Advance Care Planning: Visit at the request of Dr. Humphrey Rolls for palliative consult. Visit consisted of building trust and discussions on Palliative care medicine as specialized medical care for people living with serious illness, aimed at facilitating improved quality of life through symptoms relief, assisting with advance care planning and establishing goals of care. Education provided on Palliative Medicine vs. Hospice services. Family expressed appreciation for education provided on Palliative care and how it differs from Hospice service. Discussed with medical director; patient will be referred to Hospice services with primary dx of Parkinson's disease.  Goal of care: For patient to be comfortable, maintain quality of life.  Directives: HCPOA, MOST form; DNR, comfort measures, antibiotics and IV fluids as indicated, no feeding tube.  Symptom Management:   Parkinson's disease/dementia: Patient now bed bound, dependent for all adl's. Family and caregivers to  continue to provide assistance for all adl's. Adhere to aspiration precautions, reorient/redirect as needed. Continue carbidopa-levodopa as directed.  Appetite: Continue foods that patient enjoys; adhere to aspiration precautions.   Follow up Palliative Care Visit: Palliative care will continue to follow for complex decision making and symptom management. Return as needed.   Family /Caregiver/Community Supports: Currently no other agencies in the home.    I spent 90 minutes providing this consultation, from time includes time spent with patient/family, chart review, provider coordination, and documentation. More than 50% of the time in this consultation was spent counseling and coordinating communication.   CHIEF COMPLAINT: Palliative Medicine initial consult visit.   History obtained from review of EMR, discussion with primary team, and  interview with family, caregiver Jacqueline [name]. Records reviewed and summarized below.  HISTORY OF PRESENT ILLNESS:  Jacqueline [name] is a 85 y.o. year old female with multiple medical problems including type 2 diabetes, essential hypertension, parkinson's disease, dementia, hypothyroidism, acute metabolic encephalopathy. Patient recently hospitalized for PE, orthostatic hypotension. Palliative Care was asked to follow this patient by consultation request of Lavera Guise, MD to help address advance care planning and goals of care. This is an initial visit.   Patient has declined over the past month. She was ambulatory with walker and assist x 1, one month ago. She is no longer walking in the past two weeks, now bed bound. Family reports patient sleeping 22-23 hours a day. Appetite getting progressively worse; she is eating 0-25% of meals. Eating soft foods; family reports swallowing difficulty, pocketing foods and that she forgets to swallow. Blood sugars have been 130-170's mg/dL. Her clothes are fitting more loosely; last weight on file is 94 pounds and family reports  she has lost weight since last weight obtained. Family reports  very little urine output, unsure of last bowel movement. Family reports patient having delusions, hallucinations, paranoia. Her speech and communication has declined; sometimes she is unable to speak at all. Family also reports some seizure activity. She has lived with daughter Jacqueline [name] since October 2021.   CODE STATUS: DNR  PPS: 30%, weak  HOSPICE ELIGIBILITY/DIAGNOSIS: Parkinson's disease  ROS   General: NAD EYES: denies vision changes ENMT: dysphagia Cardiovascular: denies chest pain Pulmonary: denies cough, endorses SOB Abdomen: poor appetite, constipation, incontinence of bowel GU: denies dysuria,  incontinence of urine MSK: no falls reported Skin: denies rashes or wounds Neurological: endorses weakness, Heme/lymph/immuno: denies bruises, abnormal bleeding   Physical Exam:  Constitutional: NAD General: A & O to person, frail, very thin, ill appearing EYES: anicteric sclera, lids intact, no discharge  ENMT: intact hearing, dry mucous membranes CV: RRR,  no LE edema Pulmonary: LCTA, no increased work of breathing, no cough, no audible wheezes, room air Abdomen: bowel sounds normoactive x 4 GU: deferred MSK: severe sarcopenia, non ambulatory Skin: warm and dry, no rashes or wounds on visible skin Neuro: Generalized weakness, tremors observed Psych: non-anxious affect today Hem/lymph/immuno: no widespread bruising   PAST MEDICAL HISTORY:  Past Medical History:  Diagnosis Date  . Asthma   . Collapsed lung   . Dementia ([place])   . Diabetes mellitus without complication ([place])    Patient takes Insulin twice a day.   Marland Kitchen GERD (gastroesophageal reflux disease)   . Hyperlipidemia   . Hypertension   . Hypothyroidism   . Parkinson's disease ([place])   . Reactive airway disease     SOCIAL HX:  Social History   Tobacco Use  . Smoking status: Never Smoker  . Smokeless tobacco: Never Used  Substance Use Topics  .  Alcohol use: Not Currently   FAMILY HX:  Family History  Problem Relation Age of Onset  . Bladder Cancer Neg Hx   . Kidney cancer Neg Hx     ALLERGIES: No Known Allergies   PERTINENT MEDICATIONS:  Outpatient Encounter Medications as of 08/14/2020  Medication Sig  . Accu-Chek FastClix Lancets MISC Once a day  . amantadine (SYMMETREL) 100 MG capsule Take 100 mg by mouth daily.  . APIXABAN (ELIQUIS) VTE STARTER PACK (10MG AND 5MG) Take as directed on package: start with two-23m tablets twice daily for 7 days. On day 8, switch to one-568mtablet twice daily.  . [place] Kitchenscorbic acid (VITAMIN C) 500 MG tablet Take 500 mg by mouth daily.  . [place] Kitchentorvastatin (LIPITOR) 10 MG tablet TAKE A HALF TABLET BY MOUTH EVERY DAY (Patient taking differently: Take 5 mg by mouth daily. TAKE A HALF TABLET BY MOUTH EVERY DAY)  . Calcium-Vitamin D-Vitamin K [id number]-UNT-MCG CHEW Chew by mouth.  . carbidopa-levodopa (SINEMET IR) 25-100 MG tablet Take 0.5-1 tablets by mouth See admin instructions. Take 1 tablet by mouth at 10 am, take 1/2 tablet by mouth at 12 pm,take 1/2 tablet at 2 pm, take 1/2 tablet at 4 pm, take 1/2 tablet at 6 pm and 1 tablet at 8 pm  . Carbidopa-Levodopa ER (SINEMET CR) 25-100 MG tablet controlled release Take 1 tablet by mouth at bedtime.  . cholecalciferol (VITAMIN D3) 25 MCG (1000 UNIT) tablet Take 500 Units by mouth daily.  . fluticasone (FLONASE) 50 MCG/ACT nasal spray Place 1 spray into the nose daily.   . Insulin Glargine (BASAGLAR KWIKPEN) 100 UNIT/ML SOPN Inject 16 Units into the skin daily.   . [place] Kitchenevothyroxine (SYNTHROID, LEVOTHROID) 50 MCG tablet  Take 25 mcg by mouth daily before breakfast.   . memantine (NAMENDA) 10 MG tablet Take 10 mg by mouth 2 (two) times daily.   . midodrine (PROAMATINE) 2.5 MG tablet Take 2.5 mg by mouth daily with breakfast.  . omeprazole (PRILOSEC) 40 MG capsule TAKE 1 CAPSULE BY MOUTH EVERY DAY (Patient taking differently: Take 40 mg by mouth daily.)  . rasagiline  (AZILECT) 1 MG TABS tablet Take 1 mg by mouth daily.  . SYMBICORT 80-4.5 MCG/ACT inhaler TAKE 2 PUFFS BY MOUTH TWICE A DAY   No facility-administered encounter medications on file as of 08/14/2020.     Thank you for the opportunity to participate in the care of Ms. Pe. The palliative care team will continue to follow. Please call our office at [phone] if we can be of additional assistance.  Ezekiel Slocumb, NP ,  AGPCNP-C

## 2020-08-15 NOTE — Telephone Encounter (Signed)
Ok thank you 

## 2020-08-16 ENCOUNTER — Telehealth: Payer: Self-pay

## 2020-08-16 NOTE — Telephone Encounter (Signed)
Called Jacqueline [name] back and informed her that Dr Clayborn Bigness will be her attending physician for hospice.

## 2020-08-21 ENCOUNTER — Telehealth: Payer: Self-pay

## 2020-08-21 NOTE — Telephone Encounter (Signed)
Provider signed [facility] orders and faxed back to [facility] at [phone]. Copy placed in scan. Jacqueline [name]

## 2020-09-23 ENCOUNTER — Telehealth: Payer: Self-pay

## 2020-09-23 NOTE — Telephone Encounter (Signed)
Faxed physician orders to [facility] at [phone]. Copy placed in scan. Jacqueline [name]

## 2020-10-07 ENCOUNTER — Ambulatory Visit: Payer: Medicare HMO | Admitting: Physician Assistant

## 2020-11-14 IMAGING — MR MR HEAD W/O CM
9 of 10 series · 43 of 48 positions shown · non-contrast
Comparison: CT head 07/29/2019

CLINICAL DATA: Encephalopathy.  Rule out stroke.

EXAM:
MRI HEAD WITHOUT CONTRAST
TECHNIQUE: Multiplanar, multiecho pulse sequences of the brain and surrounding
structures were obtained without intravenous contrast.

[Series 5: ax dwi_tracew · axial · 3.0mm · 0.71mm/px · z∈[-130,+29]mm · 7 of 56 slices shown]
[im 1/56]
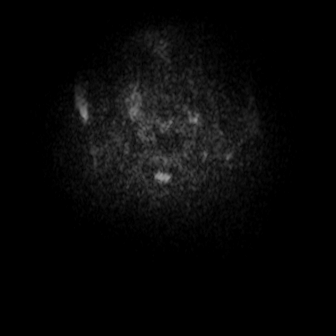
[im 10/56]
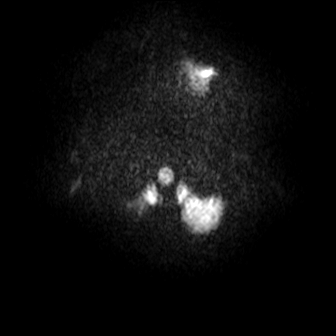
[im 19/56]
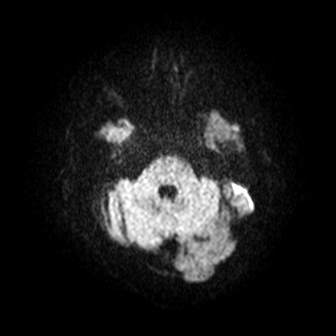
[im 28/56]
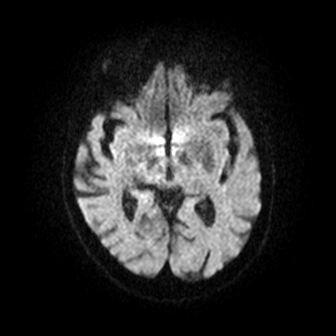
[im 37/56]
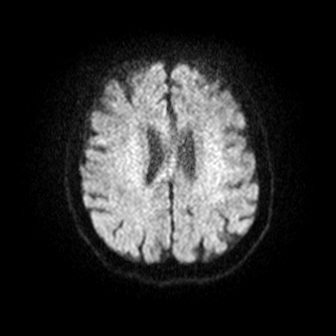
[im 46/56]
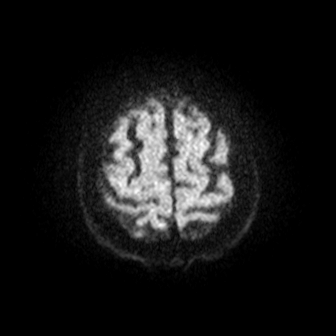
[im 56/56]
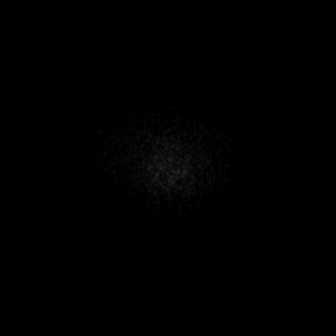

[Series 6: ax dwi_adc · axial · 3.0mm · 0.71mm/px · z∈[-130,+29]mm · 8 of 56 slices shown]
[im 1/56]
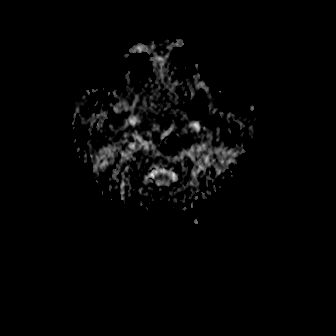
[im 8/56]
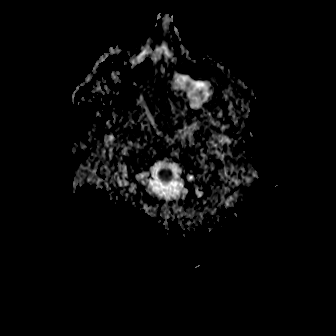
[im 16/56]
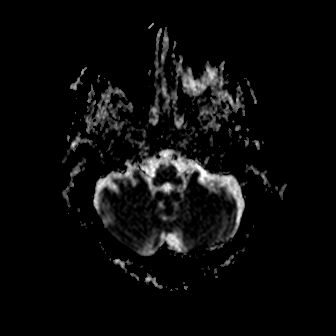
[im 24/56]
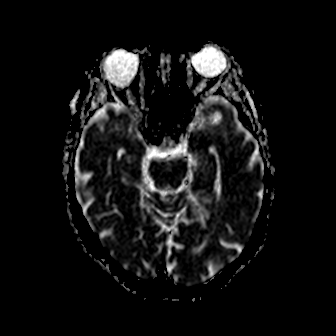
[im 32/56]
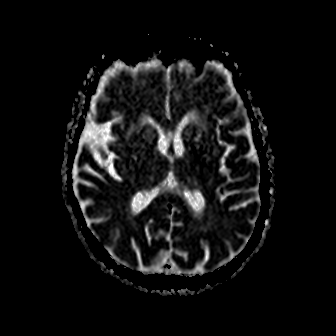
[im 40/56]
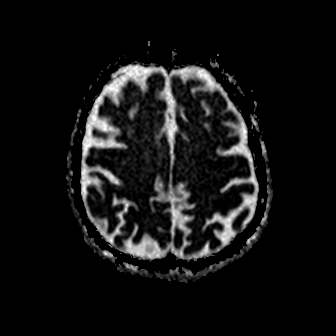
[im 48/56]
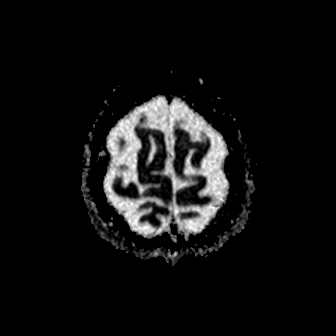
[im 56/56]
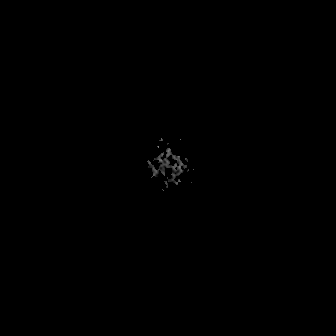

[Series 7: cor dwi_tracew · coronal · 5.0mm · 0.68mm/px · 5 of 40 slices shown]
[im 1/40]
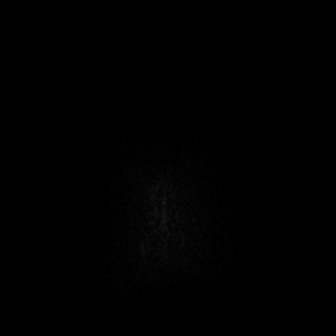
[im 10/40]
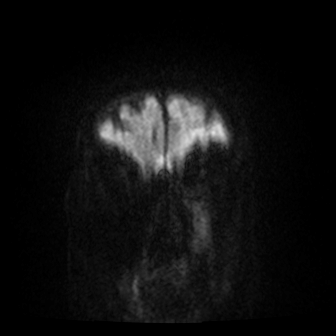
[im 20/40]
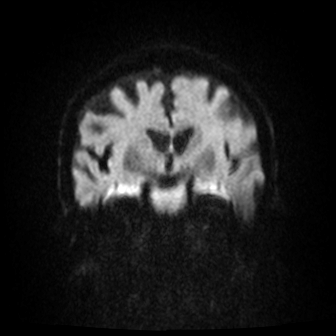
[im 30/40]
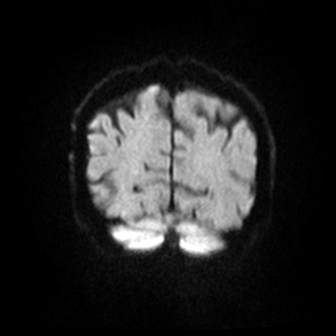
[im 40/40]
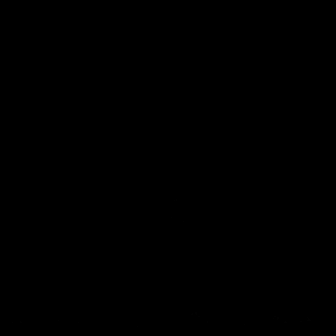

[Series 8: cor dwi_adc · coronal · 5.0mm · 0.68mm/px · 4 of 39 slices shown]
[im 1/39]
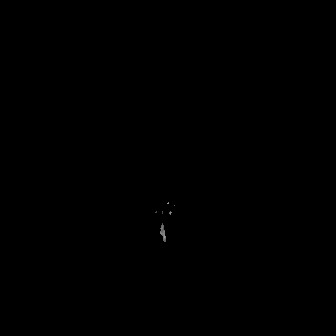
[im 10/39]
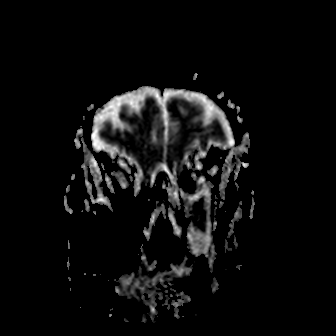
[im 20/39]
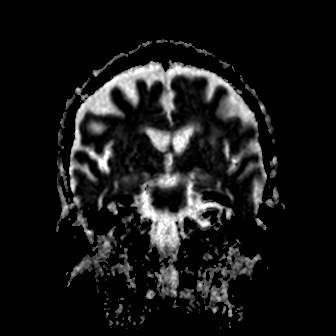
[im 29/39]
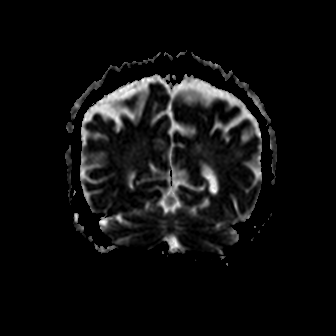

[Series 9: T1 · sagittal · 5.0mm · 0.94mm/px · 3 of 25 slices shown (1 of 2)]
[im 1/25]
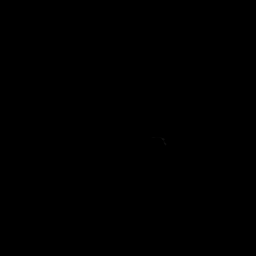
[im 13/25]
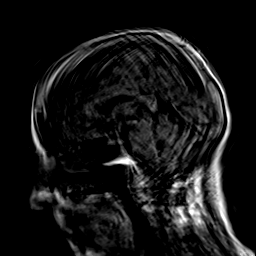
[im 25/25]
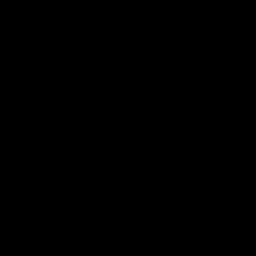

[Series 10: T2 · axial · 5.0mm · 0.45mm/px · z∈[-125,+25]mm · 4 of 27 slices shown (1 of 2)]
[im 1/27]
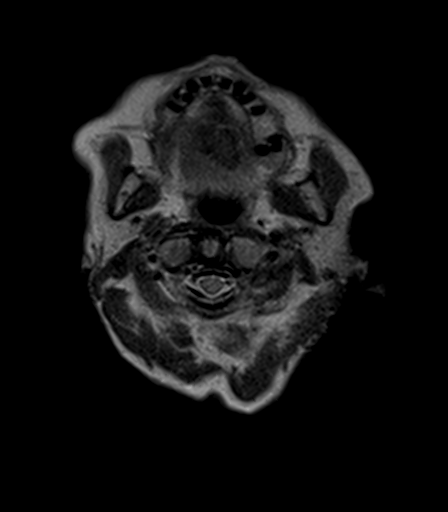
[im 9/27]
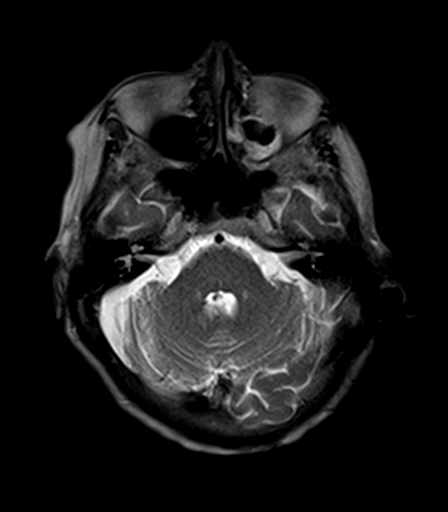
[im 18/27]
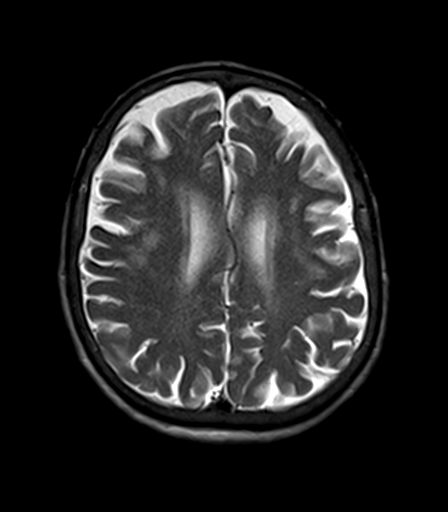
[im 27/27]
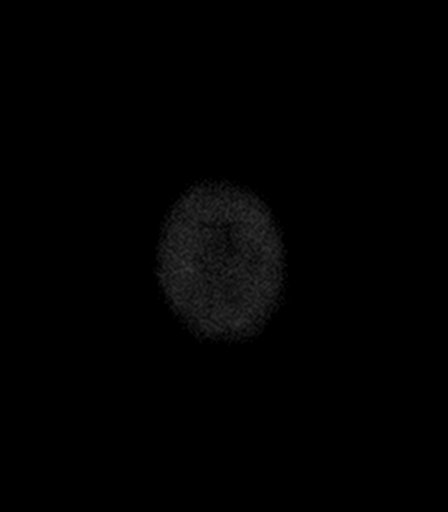

[Series 12: FLAIR · axial · 5.0mm · 1.20mm/px · z∈[-123,+27]mm · 4 of 27 slices shown]
[im 1/27]
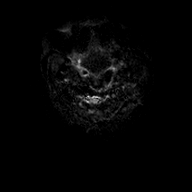
[im 9/27]
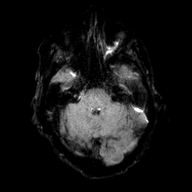
[im 18/27]
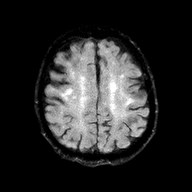
[im 27/27]
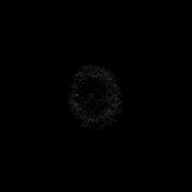

[Series 13: T1 · axial · 5.0mm · 0.90mm/px · z∈[-125,+25]mm · 4 of 27 slices shown (2 of 2)]
[im 1/27]
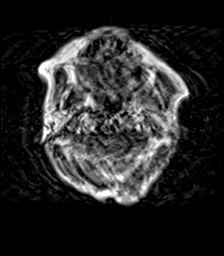
[im 9/27]
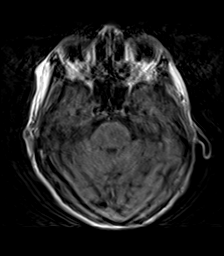
[im 18/27]
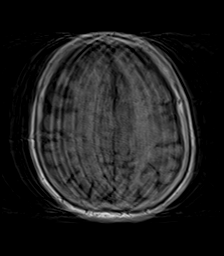
[im 27/27]
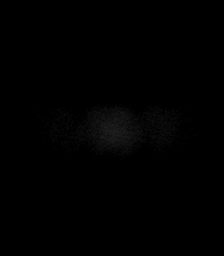

[Series 14: T2 · coronal · 5.0mm · 0.45mm/px · 4 of 31 slices shown (2 of 2)]
[im 1/31]
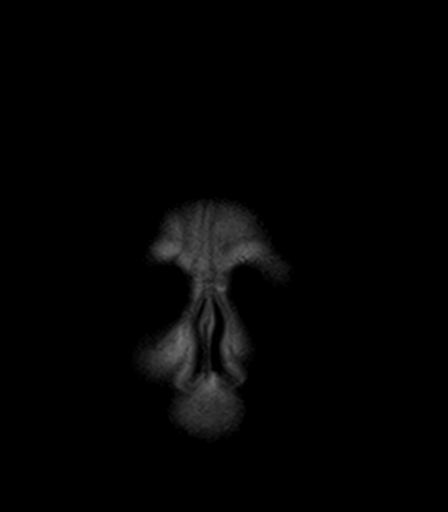
[im 11/31]
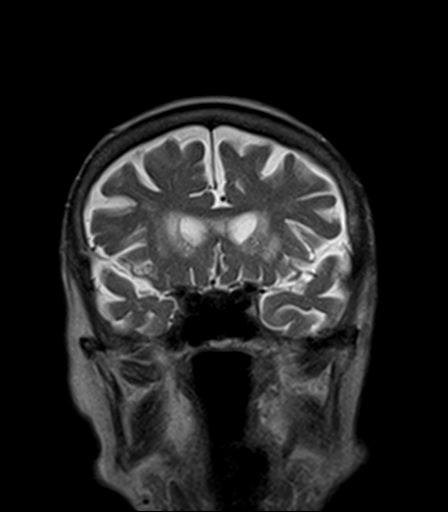
[im 21/31]
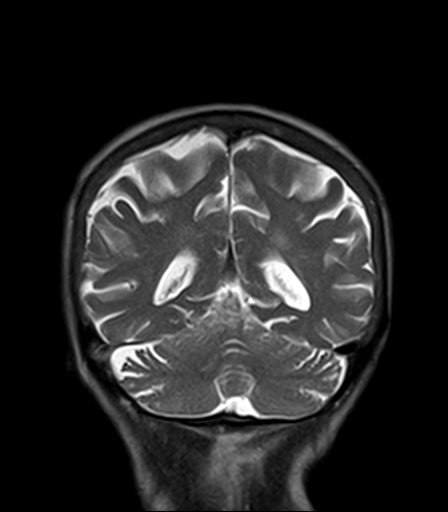
[im 31/31]
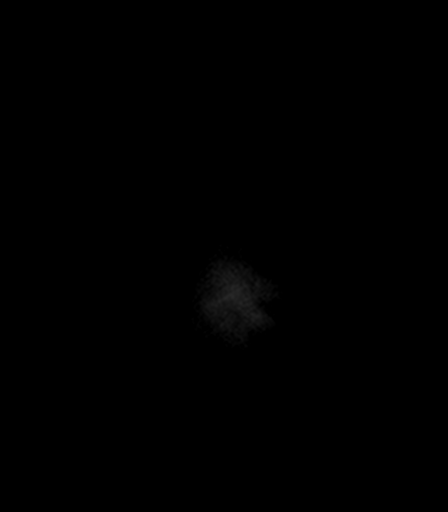

[43 of 48 positions shown; findings below may reference images not displayed]

FINDINGS: Brain: Motion degraded study.  Allowing for this, no acute infarct

Generalized atrophy. Chronic microvascular ischemic changes in the
white matter. Negative for hemorrhage mass or fluid collection.

Vascular: Normal arterial flow voids

Skull and upper cervical spine: Negative

Sinuses/Orbits: Mucosal thickening left maxillary sinus. Bilateral
cataract extraction

Other: None
IMPRESSION: Motion degraded study.  No acute abnormality.

## 2020-12-31 ENCOUNTER — Telehealth: Payer: Self-pay

## 2020-12-31 NOTE — Telephone Encounter (Signed)
Plan of care orders signed, faxed back to [facility]

## 2021-01-14 ENCOUNTER — Telehealth: Payer: Self-pay

## 2021-01-14 NOTE — Telephone Encounter (Signed)
Signed order faxed back to [facility] [phone]. Sent to be scanned-Toni

## 2021-01-26 ENCOUNTER — Other Ambulatory Visit: Payer: Self-pay | Admitting: Internal Medicine

## 2021-01-26 DIAGNOSIS — G20A1 Parkinson's disease without dyskinesia, without mention of fluctuations: Secondary | ICD-10-CM

## 2021-01-26 DIAGNOSIS — E782 Mixed hyperlipidemia: Secondary | ICD-10-CM

## 2021-01-26 DIAGNOSIS — Z23 Encounter for immunization: Secondary | ICD-10-CM

## 2021-01-26 DIAGNOSIS — E119 Type 2 diabetes mellitus without complications: Secondary | ICD-10-CM

## 2021-01-26 DIAGNOSIS — R300 Dysuria: Secondary | ICD-10-CM

## 2021-01-26 DIAGNOSIS — L89152 Pressure ulcer of sacral region, stage 2: Secondary | ICD-10-CM

## 2021-01-26 DIAGNOSIS — Z0001 Encounter for general adult medical examination with abnormal findings: Secondary | ICD-10-CM

## 2021-01-26 DIAGNOSIS — G20 Parkinson's disease: Secondary | ICD-10-CM

## 2021-01-26 DIAGNOSIS — Z5320 Procedure and treatment not carried out because of patient's decision for unspecified reasons: Secondary | ICD-10-CM

## 2021-01-26 DIAGNOSIS — Z794 Long term (current) use of insulin: Secondary | ICD-10-CM

## 2021-01-27 ENCOUNTER — Other Ambulatory Visit: Payer: Self-pay

## 2021-01-27 MED ORDER — OMEPRAZOLE 40 MG PO CPDR
40.0000 mg | DELAYED_RELEASE_CAPSULE | Freq: Every day | ORAL | 0 refills | Status: AC
Start: 2021-01-27 — End: ?

## 2021-02-12 ENCOUNTER — Telehealth: Payer: Self-pay

## 2021-02-12 NOTE — Telephone Encounter (Signed)
Paperwork signed and faxed back to [facility]

## 2021-02-12 NOTE — Telephone Encounter (Signed)
Left vm for [name] to see how has been doing and to see if she wanted to continue [place] as her pcp-Toni

## 2021-02-14 ENCOUNTER — Telehealth: Payer: Self-pay

## 2021-02-14 NOTE — Telephone Encounter (Signed)
Received paperwork from [facility] (not duplicate). Gave to dfk to sign. I spoke with Garey Ham, she will discuss with hospice nurse regarding pcp with hospice-Toni

## 2021-02-18 ENCOUNTER — Telehealth: Payer: Self-pay

## 2021-02-18 NOTE — Telephone Encounter (Signed)
Orders signed and faxed back to [facility] @ [phone]. Sent to be scanned-Toni

## 2021-03-04 ENCOUNTER — Telehealth: Payer: Self-pay

## 2021-03-04 NOTE — Telephone Encounter (Signed)
Received orders from [employer]. Gave to dfk to sign-Toni

## 2021-03-06 NOTE — Telephone Encounter (Signed)
Order signed, faxed back to [phone] and sent to be scanned-Toni

## 2021-03-08 DEATH — deceased

## 2021-06-03 ENCOUNTER — Encounter: Payer: Self-pay | Admitting: Internal Medicine

## 2023-05-17 ENCOUNTER — Encounter: Payer: Self-pay | Admitting: Emergency Medicine
